# Patient Record
Sex: Male | Born: 1955 | Race: White | Hispanic: No | Smoking: Former smoker
Health system: Southern US, Community
[De-identification: ages and names within clinical notes are randomized; demographics above are authoritative.]

## PROBLEM LIST (undated history)

## (undated) DIAGNOSIS — Z87442 Personal history of urinary calculi: Secondary | ICD-10-CM

## (undated) DIAGNOSIS — K7460 Unspecified cirrhosis of liver: Secondary | ICD-10-CM

## (undated) DIAGNOSIS — K449 Diaphragmatic hernia without obstruction or gangrene: Secondary | ICD-10-CM

## (undated) DIAGNOSIS — K759 Inflammatory liver disease, unspecified: Secondary | ICD-10-CM

## (undated) DIAGNOSIS — I10 Essential (primary) hypertension: Secondary | ICD-10-CM

## (undated) DIAGNOSIS — B9681 Helicobacter pylori [H. pylori] as the cause of diseases classified elsewhere: Secondary | ICD-10-CM

## (undated) DIAGNOSIS — E782 Mixed hyperlipidemia: Secondary | ICD-10-CM

## (undated) DIAGNOSIS — F419 Anxiety disorder, unspecified: Secondary | ICD-10-CM

## (undated) DIAGNOSIS — T7840XA Allergy, unspecified, initial encounter: Secondary | ICD-10-CM

## (undated) DIAGNOSIS — D3610 Benign neoplasm of peripheral nerves and autonomic nervous system, unspecified: Secondary | ICD-10-CM

## (undated) DIAGNOSIS — I2510 Atherosclerotic heart disease of native coronary artery without angina pectoris: Secondary | ICD-10-CM

## (undated) DIAGNOSIS — K2970 Gastritis, unspecified, without bleeding: Secondary | ICD-10-CM

## (undated) DIAGNOSIS — F1910 Other psychoactive substance abuse, uncomplicated: Secondary | ICD-10-CM

## (undated) DIAGNOSIS — S060X9A Concussion with loss of consciousness of unspecified duration, initial encounter: Secondary | ICD-10-CM

## (undated) DIAGNOSIS — Z720 Tobacco use: Secondary | ICD-10-CM

## (undated) DIAGNOSIS — S060XAA Concussion with loss of consciousness status unknown, initial encounter: Secondary | ICD-10-CM

## (undated) DIAGNOSIS — I639 Cerebral infarction, unspecified: Secondary | ICD-10-CM

## (undated) DIAGNOSIS — C4490 Unspecified malignant neoplasm of skin, unspecified: Secondary | ICD-10-CM

## (undated) DIAGNOSIS — K219 Gastro-esophageal reflux disease without esophagitis: Secondary | ICD-10-CM

## (undated) HISTORY — DX: Gastro-esophageal reflux disease without esophagitis: K21.9

## (undated) HISTORY — DX: Unspecified cirrhosis of liver: K74.60

## (undated) HISTORY — DX: Mixed hyperlipidemia: E78.2

## (undated) HISTORY — DX: Essential (primary) hypertension: I10

## (undated) HISTORY — PX: HAND RECONSTRUCTION: SHX1730

## (undated) HISTORY — DX: Unspecified malignant neoplasm of skin, unspecified: C44.90

## (undated) HISTORY — DX: Allergy, unspecified, initial encounter: T78.40XA

## (undated) HISTORY — DX: Other psychoactive substance abuse, uncomplicated: F19.10

## (undated) HISTORY — DX: Anxiety disorder, unspecified: F41.9

## (undated) HISTORY — PX: LEG TENDON SURGERY: SHX1004

## (undated) HISTORY — DX: Diaphragmatic hernia without obstruction or gangrene: K44.9

## (undated) HISTORY — DX: Gastritis, unspecified, without bleeding: K29.70

## (undated) HISTORY — DX: Helicobacter pylori (H. pylori) as the cause of diseases classified elsewhere: B96.81

---
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
## 1999-07-15 HISTORY — PX: EXTRACORPOREAL SHOCK WAVE LITHOTRIPSY: SHX1557

## 2003-07-20 ENCOUNTER — Emergency Department (HOSPITAL_COMMUNITY): Admission: EM | Admit: 2003-07-20 | Discharge: 2003-07-20 | Payer: Self-pay | Admitting: Emergency Medicine

## 2006-02-25 ENCOUNTER — Emergency Department (HOSPITAL_COMMUNITY): Admission: EM | Admit: 2006-02-25 | Discharge: 2006-02-25 | Payer: Self-pay | Admitting: Emergency Medicine

## 2007-08-15 ENCOUNTER — Emergency Department (HOSPITAL_COMMUNITY): Admission: EM | Admit: 2007-08-15 | Discharge: 2007-08-15 | Payer: Self-pay | Admitting: Emergency Medicine

## 2007-08-25 ENCOUNTER — Emergency Department (HOSPITAL_COMMUNITY): Admission: EM | Admit: 2007-08-25 | Discharge: 2007-08-25 | Payer: Self-pay | Admitting: Emergency Medicine

## 2008-08-04 ENCOUNTER — Emergency Department (HOSPITAL_COMMUNITY): Admission: EM | Admit: 2008-08-04 | Discharge: 2008-08-04 | Payer: Self-pay | Admitting: Emergency Medicine

## 2009-09-26 ENCOUNTER — Emergency Department (HOSPITAL_COMMUNITY): Admission: EM | Admit: 2009-09-26 | Discharge: 2009-09-26 | Payer: Self-pay

## 2010-10-06 LAB — CBC
HCT: 43.2 % (ref 39.0–52.0)
Hemoglobin: 15.1 g/dL (ref 13.0–17.0)
MCHC: 35 g/dL (ref 30.0–36.0)
MCV: 93 fL (ref 78.0–100.0)
Platelets: 207 10*3/uL (ref 150–400)
RBC: 4.65 MIL/uL (ref 4.22–5.81)
RDW: 13.6 % (ref 11.5–15.5)
WBC: 8.3 10*3/uL (ref 4.0–10.5)

## 2010-10-06 LAB — COMPREHENSIVE METABOLIC PANEL
ALT: 26 U/L (ref 0–53)
AST: 24 U/L (ref 0–37)
Albumin: 3.7 g/dL (ref 3.5–5.2)
Alkaline Phosphatase: 84 U/L (ref 39–117)
BUN: 13 mg/dL (ref 6–23)
CO2: 27 mEq/L (ref 19–32)
Calcium: 9 mg/dL (ref 8.4–10.5)
Chloride: 106 mEq/L (ref 96–112)
Creatinine, Ser: 0.91 mg/dL (ref 0.4–1.5)
GFR calc Af Amer: >60 mL/min (ref >60)
GFR calc non Af Amer: >60 mL/min (ref >60)
Glucose, Bld: 83 mg/dL (ref 70–99)
Potassium: 3.8 mEq/L (ref 3.5–5.1)
Sodium: 139 mEq/L (ref 135–145)
Total Bilirubin: 0.5 mg/dL (ref 0.3–1.2)
Total Protein: 6.9 g/dL (ref 6.0–8.3)

## 2010-10-06 LAB — DIFFERENTIAL
Basophils Absolute: 0 10*3/uL (ref 0.0–0.1)
Basophils Relative: 0 % (ref 0–1)
Eosinophils Absolute: 0.1 10*3/uL (ref 0.0–0.7)
Eosinophils Relative: 1 % (ref 0–5)
Lymphocytes Relative: 29 % (ref 12–46)
Lymphs Abs: 2.4 10*3/uL (ref 0.7–4.0)
Monocytes Absolute: 0.6 10*3/uL (ref 0.1–1.0)
Monocytes Relative: 7 % (ref 3–12)
Neutro Abs: 5.2 10*3/uL (ref 1.7–7.7)
Neutrophils Relative %: 63 % (ref 43–77)

## 2010-10-06 LAB — URINALYSIS, ROUTINE W REFLEX MICROSCOPIC
Bilirubin Urine: NEGATIVE
Glucose, UA: NEGATIVE mg/dL
Hgb urine dipstick: NEGATIVE
Nitrite: NEGATIVE
Protein, ur: NEGATIVE mg/dL
Specific Gravity, Urine: >1.03 — ABNORMAL HIGH (ref 1.005–1.030)
Urobilinogen, UA: 1 mg/dL (ref 0.0–1.0)
pH: 6.5 (ref 5.0–8.0)

## 2010-10-06 LAB — POCT CARDIAC MARKERS
CKMB, poc: <1 ng/mL — ABNORMAL LOW (ref 1.0–8.0)
Myoglobin, poc: 59.7 ng/mL (ref 12–200)
Troponin i, poc: <0.05 ng/mL (ref 0.00–0.09)

## 2011-07-06 ENCOUNTER — Emergency Department (HOSPITAL_COMMUNITY)
Admission: EM | Admit: 2011-07-06 | Discharge: 2011-07-06 | Disposition: A | Discharge status: Home / Self Care | Payer: Self-pay | Attending: Emergency Medicine | Admitting: Emergency Medicine

## 2011-07-06 ENCOUNTER — Encounter: Payer: Self-pay | Admitting: *Deleted

## 2011-07-06 DIAGNOSIS — F17200 Nicotine dependence, unspecified, uncomplicated: Secondary | ICD-10-CM | POA: Insufficient documentation

## 2011-07-06 DIAGNOSIS — Y93H2 Activity, gardening and landscaping: Secondary | ICD-10-CM | POA: Insufficient documentation

## 2011-07-06 DIAGNOSIS — I10 Essential (primary) hypertension: Secondary | ICD-10-CM | POA: Insufficient documentation

## 2011-07-06 DIAGNOSIS — S0500XA Injury of conjunctiva and corneal abrasion without foreign body, unspecified eye, initial encounter: Secondary | ICD-10-CM

## 2011-07-06 DIAGNOSIS — E789 Disorder of lipoprotein metabolism, unspecified: Secondary | ICD-10-CM | POA: Insufficient documentation

## 2011-07-06 DIAGNOSIS — IMO0002 Reserved for concepts with insufficient information to code with codable children: Secondary | ICD-10-CM | POA: Insufficient documentation

## 2011-07-06 DIAGNOSIS — H5710 Ocular pain, unspecified eye: Secondary | ICD-10-CM | POA: Insufficient documentation

## 2011-07-06 DIAGNOSIS — S058X9A Other injuries of unspecified eye and orbit, initial encounter: Secondary | ICD-10-CM | POA: Insufficient documentation

## 2011-07-06 MED ORDER — MOXIFLOXACIN HCL 0.5 % OP SOLN: 1.0000 [drp] | Freq: Three times a day (TID) | OPHTHALMIC | Status: AC

## 2011-07-06 MED ORDER — TETRACAINE HCL 0.5 % OP SOLN
2.0000 [drp] | Freq: Once | OPHTHALMIC | Status: AC
  Administered 2011-07-06: 2 [drp] via OPHTHALMIC
  Filled 2011-07-06: qty 2

## 2011-07-06 MED ORDER — FLUORESCEIN SODIUM 1 MG OP STRP
ORAL_STRIP | OPHTHALMIC | Status: AC
  Administered 2011-07-06: 11:00:00 via OPHTHALMIC
  Filled 2011-07-06: qty 1

## 2011-07-06 NOTE — ED Provider Notes (Signed)
History   This chart was scribed for Duane Baker, MD by Charolett Bumpers . The patient was seen in room APA19/APA19 and the patient's care was started at 9:05am.  CSN: [id number]  Arrival date & time 07/06/11  1914   First MD Initiated Contact with Patient 07/06/11 [phone]      Chief Complaint  Patient presents with  . Eye Pain    (Consider location/radiation/quality/duration/timing/severity/associated sxs/prior treatment) HPI Duane Adams is a 55 y.o. male who presents to the Emergency Department complaining of constant, moderate pain in his right eye with associated foreign body sensation, drainage and redness with an onset of yesterday. Patient states he was cutting the grass yesterday when a rock hit him in the right eye. Patient was wearing glasses at the time of incident. Reports pain is aggravated and relieved by nothing. Some photophobia noted   Past Medical History  Diagnosis Date  . Hypertension   . Anginal pain   . High cholesterol     History reviewed. No pertinent past surgical history.  No family history on file.  History  Substance Use Topics  . Smoking status: Current Everyday Smoker    Types: Cigarettes  . Smokeless tobacco: Not on file  . Alcohol Use: Yes     occasional      Review of Systems A complete 10 system review of systems was obtained and is otherwise negative except as noted in the HPI and PMH.   Allergies  Review of patient's allergies indicates no known allergies.  Home Medications  No current outpatient prescriptions on file.  BP 154/85  Pulse 110  Temp(Src) 98.3 F (36.8 C) (Oral)  Resp 16  Ht 5\' 9"  (1.753 m)  Wt 165 lb (74.844 kg)  BMI 24.37 kg/m2  SpO2 98%  Physical Exam  Nursing note and vitals reviewed. Constitutional: He is oriented to person, place, and time. He appears well-developed and well-nourished. No distress.  HENT:  Head: Normocephalic and atraumatic.  Eyes: EOM are normal. Pupils are equal,  round, and reactive to light. Right eye exhibits no discharge. Left eye exhibits no discharge.       No drainage. Right sclera injected, cornea clear. No hyphema.   Neck: Neck supple. No tracheal deviation present.  Cardiovascular: Normal rate, regular rhythm and normal heart sounds.   Pulmonary/Chest: Effort normal. No respiratory distress.  Abdominal: Soft. He exhibits no distension.  Musculoskeletal: Normal range of motion. He exhibits no edema.  Neurological: He is alert and oriented to person, place, and time. No sensory deficit.  Skin: Skin is warm and dry.  Psychiatric: He has a normal mood and affect. His behavior is normal.    ED Course  Procedures (including critical care time) DIAGNOSTIC STUDIES: Oxygen Saturation is 98% on room air, normal by my interpretation.    COORDINATION OF CARE:     Labs Reviewed - No data to display No results found.   No diagnosis found.    MDM  Patient's eye was stained with fluorescein after being given tetracaine. Patient has positive diet uptake at the 4:00 medial region of his eye on the right side. Patient will be irrigated with saline and will be placed on antibiotic drops for his corneal abrasion   I personally performed the services described in this documentation, which was scribed in my presence. The recorded information has been reviewed and considered.       Duane Baker, MD 07/06/11 1006

## 2011-07-06 NOTE — ED Notes (Signed)
Pt states was cutting grass yesterday and rock hit him in right eye.  C/o increased pain, drainage, and redness to right eye.  States feels like something is in eye.

## 2011-10-30 ENCOUNTER — Emergency Department (HOSPITAL_COMMUNITY)
Admission: EM | Admit: 2011-10-30 | Discharge: 2011-10-30 | Disposition: A | Discharge status: Home / Self Care | Payer: Self-pay | Attending: Emergency Medicine | Admitting: Emergency Medicine

## 2011-10-30 ENCOUNTER — Encounter (HOSPITAL_COMMUNITY): Payer: Self-pay | Admitting: Emergency Medicine

## 2011-10-30 DIAGNOSIS — I10 Essential (primary) hypertension: Secondary | ICD-10-CM | POA: Insufficient documentation

## 2011-10-30 DIAGNOSIS — Z79899 Other long term (current) drug therapy: Secondary | ICD-10-CM | POA: Insufficient documentation

## 2011-10-30 DIAGNOSIS — R209 Unspecified disturbances of skin sensation: Secondary | ICD-10-CM | POA: Insufficient documentation

## 2011-10-30 DIAGNOSIS — F17200 Nicotine dependence, unspecified, uncomplicated: Secondary | ICD-10-CM | POA: Insufficient documentation

## 2011-10-30 DIAGNOSIS — R42 Dizziness and giddiness: Secondary | ICD-10-CM | POA: Insufficient documentation

## 2011-10-30 DIAGNOSIS — E789 Disorder of lipoprotein metabolism, unspecified: Secondary | ICD-10-CM | POA: Insufficient documentation

## 2011-10-30 LAB — DIFFERENTIAL
Basophils Absolute: 0 10*3/uL (ref 0.0–0.1)
Basophils Relative: 1 % (ref 0–1)
Eosinophils Absolute: 0.1 10*3/uL (ref 0.0–0.7)
Eosinophils Relative: 1 % (ref 0–5)
Lymphocytes Relative: 30 % (ref 12–46)
Lymphs Abs: 2.2 10*3/uL (ref 0.7–4.0)
Monocytes Absolute: 0.5 10*3/uL (ref 0.1–1.0)
Monocytes Relative: 7 % (ref 3–12)
Neutro Abs: 4.6 10*3/uL (ref 1.7–7.7)
Neutrophils Relative %: 63 % (ref 43–77)

## 2011-10-30 LAB — CBC
HCT: 45.6 % (ref 39.0–52.0)
Hemoglobin: 15.4 g/dL (ref 13.0–17.0)
MCH: 31.5 pg (ref 26.0–34.0)
MCHC: 33.8 g/dL (ref 30.0–36.0)
MCV: 93.3 fL (ref 78.0–100.0)
Platelets: 170 10*3/uL (ref 150–400)
RBC: 4.89 MIL/uL (ref 4.22–5.81)
RDW: 14.2 % (ref 11.5–15.5)
WBC: 7.4 10*3/uL (ref 4.0–10.5)

## 2011-10-30 LAB — BASIC METABOLIC PANEL
BUN: 7 mg/dL (ref 6–23)
CO2: 27 mEq/L (ref 19–32)
Calcium: 9.4 mg/dL (ref 8.4–10.5)
Chloride: 100 mEq/L (ref 96–112)
Creatinine, Ser: 0.71 mg/dL (ref 0.50–1.35)
GFR calc Af Amer: >90 mL/min (ref >90)
GFR calc non Af Amer: >90 mL/min (ref >90)
Glucose, Bld: 104 mg/dL — ABNORMAL HIGH (ref 70–99)
Potassium: 4.2 mEq/L (ref 3.5–5.1)
Sodium: 135 mEq/L (ref 135–145)

## 2011-10-30 LAB — URINALYSIS, ROUTINE W REFLEX MICROSCOPIC
Bilirubin Urine: NEGATIVE
Glucose, UA: NEGATIVE mg/dL
Hgb urine dipstick: NEGATIVE
Ketones, ur: NEGATIVE mg/dL
Leukocytes, UA: NEGATIVE
Nitrite: NEGATIVE
Protein, ur: NEGATIVE mg/dL
Specific Gravity, Urine: 1.01 (ref 1.005–1.030)
Urobilinogen, UA: 0.2 mg/dL (ref 0.0–1.0)
pH: 7.5 (ref 5.0–8.0)

## 2011-10-30 MED ORDER — HYDROCHLOROTHIAZIDE 25 MG PO TABS: 25.0000 mg | ORAL_TABLET | Freq: Every day | ORAL | Status: DC

## 2011-10-30 MED ORDER — ATENOLOL 50 MG PO TABS: 50.0000 mg | ORAL_TABLET | Freq: Every day | ORAL | Status: DC

## 2011-10-30 NOTE — ED Provider Notes (Signed)
History   This chart was scribed for Flint Melter, MD by Cherlynn Perches. The patient was seen in room APA16A/APA16A. Patient's care was started at 1043.    CSN: [id number]  Arrival date & time 10/30/11  1043   First MD Initiated Contact with Patient 10/30/11 1101      Chief Complaint  Patient presents with  . Dizziness  . Numbness    (Consider location/radiation/quality/duration/timing/severity/associated sxs/prior treatment) HPI  VU LIEBMAN is a 56 y.o. male who presents to the Emergency Department complaining of 3 hours of intermittent numbness of the left arm and left foot with associated dizziness and light-headedness. 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 Pt states that when episodes began they lasted 30 seconds each with 10 minutes between episodes, but the frequency and duration of episodes increased. Last episode was immediately prior to reporting to ED. Pt also reports that he has not eaten anything today, but states that he normally does not eat breakfast until 10:30 AM. Pt reports that he has not been compliant in taking high blood pressure and cholesterol medications as prescribed. Pt denies HA, neck pain, back pain, chest pain, and fever. Pt is a current everyday smoker and uses alcohol occasionally.    Past Medical History  Diagnosis Date  . Hypertension   . Anginal pain   . High cholesterol     History reviewed. No pertinent past surgical history.  History reviewed. No pertinent family history.  History  Substance Use Topics  . Smoking status: Current Everyday Smoker -- 1.0 packs/day    Types: Cigarettes  . Smokeless tobacco: Not on file  . Alcohol Use: Yes     occasional      Review of Systems  Allergies  Review of patient's allergies indicates no known allergies.  Home Medications   Current Outpatient Rx  Name Route Sig Dispense Refill  . ATENOLOL 50 MG PO TABS Oral Take 1 tablet (50 mg total) by mouth  daily. 90 tablet 0  . HYDROCHLOROTHIAZIDE 25 MG PO TABS Oral Take 1 tablet (25 mg total) by mouth daily. 90 tablet 0    BP 177/96  Pulse 82  Temp(Src) 98.3 F (36.8 C) (Oral)  Resp 20  Ht 5\' 9"  (1.753 m)  Wt 165 lb (74.844 kg)  BMI 24.37 kg/m2  SpO2 97%  Physical Exam  Nursing note and vitals reviewed. Constitutional: He is oriented to person, place, and time. He appears well-developed and well-nourished.  HENT:  Head: Normocephalic and atraumatic.  Right Ear: External ear normal.  Left Ear: External ear normal.  Eyes: Conjunctivae and EOM are normal. Pupils are equal, round, and reactive to light.  Neck: Normal range of motion and phonation normal. Neck supple.  Cardiovascular: Normal rate, regular rhythm, normal heart sounds and intact distal pulses.   Pulmonary/Chest: Effort normal and breath sounds normal. He exhibits no bony tenderness.  Abdominal: Soft. Normal appearance. There is no tenderness.  Musculoskeletal: Normal range of motion.  Neurological: He is alert and oriented to person, place, and time. He has normal strength. No cranial nerve deficit or sensory deficit. He exhibits normal muscle tone. Coordination normal.  Skin: Skin is warm, dry and intact.  Psychiatric: He has a normal mood and affect. His behavior is normal. Judgment and thought content normal.    ED Course  Procedures (including critical care time) DIAGNOSTIC STUDIES: Oxygen Saturation is 97% on room air, normal by my interpretation.    COORDINATION  OF CARE: 11:30AM-Patient informed of current plan for treatment and evaluation and agrees with plan at this time.      Labs Reviewed  BASIC METABOLIC PANEL - Abnormal; Notable for the following:    Glucose, Bld 104 (*)    All other components within normal limits  CBC  DIFFERENTIAL  URINALYSIS, ROUTINE W REFLEX MICROSCOPIC  LAB REPORT - SCANNED   No results found.   1. Hypertension       MDM  Nonspecific paresthesias. Doubt  Hypertensive crises, intracranial mass lesion, metabolic instability.      I personally performed the services described in this documentation, which was scribed in my presence. The recorded information has been reviewed and considered.   Plan: Home Medications- Atenolol, HCTZ; Home Treatments- healthy diet; Recommended follow up- PCP of choice 1-2 weeks   Flint Melter, MD 10/31/11 1719

## 2011-10-30 NOTE — ED Notes (Signed)
C/o dizziness and left arm numbness since 0830 this morning off and on, grips are equal, no arm drift and pt denies any weakness

## 2011-10-30 NOTE — Discharge Instructions (Signed)
Use a resource guide to find a Doctor to see as soon as possible for ongoing care. Return here if needed for problems.  RESOURCE GUIDE  Dental Problems  Patients with Medicaid: [facility] [phone] [address]                                           [phone] [address] Phone:  [phone]                                                  Phone:  [phone]  If unable to pay or uninsured, contact:  [facility] or [facility] to become qualified for the adult dental clinic.  Chronic Pain Problems Contact [facility]  [phone] Patients need to be referred by their primary care doctor.  Insufficient Money for Medicine Contact [employer]:  call "211" or [facility] 8328745916.  No Primary Care Doctor Call [facility]  [phone] Other agencies that provide inexpensive medical care    [facility]  [phone]    [facility]  [phone]    [facility]  [phone]    [facility]  [phone]    [facility]  [phone]    [facility]  [phone]  Psychological Services [facility]  [phone] [facility] Services  [phone] [facility] Mental [facility]   [phone] (emergency services [phone])  Substance Abuse Resources [facility]  [phone] [facility] ([phone] [place] [phone] Floydene Flock [phone] Residential & Outpatient Substance Abuse Program  7736466522  Abuse/Neglect [facility] Child Abuse Hotline (514)168-7373 [facility] Child Abuse Hotline (979)566-1353 (After Hours)  Emergency Shelter [facility] [phone]  [facility] [phone] [facility] 7022082781  MRSA Hotline #:   609-418-8565    [facility] Resources  [facility]     [employer]                           [facility] Dept. [address] [place]                       [address]      [address]  [place]                                                Cristobal Goldmann Phone:  [phone]                                   Phone:  [phone]  Phone:  [phone]  [facility] Phone:  [phone]  [facility] Child Abuse Hotline (773)057-2741 ([phone] (After Hours)           Hypertension Information As your heart beats, it forces blood through your arteries. This force is your blood pressure. If the pressure is too high, it is called hypertension (HTN) or high blood pressure. HTN is dangerous because you may have it and not know it. High blood pressure may mean that your heart has to work harder to pump blood. Your arteries may be narrow or stiff. The extra work puts you at risk for heart disease, stroke, and other problems.  Blood pressure consists of two numbers, a higher number over a lower, 110/72, for example. It is stated as "110 over 72." The ideal is below 120 for the top number (systolic) and under 80 for the bottom (diastolic).  You should pay close attention to your blood pressure if you have certain conditions such as:  Heart failure.   Prior heart attack.   Diabetes   Chronic kidney disease.   Prior stroke.   Multiple risk factors for heart disease.  To see if you have HTN, your blood pressure should be measured while you are seated with your arm held at the level of the heart. It should be measured at least twice. A one-time elevated blood pressure reading (especially in the Emergency Department) does not mean that you need treatment. There may be conditions in which the blood pressure is different between your right and left arms. It is important to see your caregiver soon for a recheck. Most people have essential hypertension which means that there is  not a specific cause. This type of high blood pressure may be lowered by changing lifestyle factors such as:  Stress.   Smoking.   Lack of exercise.   Excessive weight.   Drug/tobacco/alcohol use.   Eating less salt.  Most people do not have symptoms from high blood pressure until it has caused damage to the body. Effective treatment can often prevent, delay or reduce that damage. TREATMENT  Treatment for high blood pressure, when a cause has been identified, is directed at the cause. There are a large number of medications to treat HTN. These fall into several categories, and your caregiver will help you select the medicines that are best for you. Medications may have side effects. You should review side effects with your caregiver. If your blood pressure stays high after you have made lifestyle changes or started on medicines,   Your medication(s) may need to be changed.   Other problems may need to be addressed.   Be certain you understand your prescriptions, and know how and when to take your medicine.   Be sure to follow up with your caregiver within the time frame advised (usually within two weeks) to have your blood pressure rechecked and to review your medications.   If you are taking more than one medicine to lower your blood pressure, make sure you know how and at what times they should be taken. Taking two medicines at the same time can result in blood pressure that is too low.  Document Released: 09/02/2005 Document Revised: 03/12/2011 Document Reviewed: 09/09/2007 [facility] Patient Information 2012 [place], [place].

## 2011-10-30 NOTE — ED Notes (Signed)
pts states at work had dizzy and left arm numbness episode. States normally goes away but today it stayed. Continues dizziness and left arm numbness and tingling.

## 2011-10-30 NOTE — ED Notes (Signed)
MD at bedside. 

## 2012-05-14 ENCOUNTER — Emergency Department (HOSPITAL_COMMUNITY): Payer: Self-pay

## 2012-05-14 ENCOUNTER — Encounter (HOSPITAL_COMMUNITY): Payer: Self-pay | Admitting: Emergency Medicine

## 2012-05-14 ENCOUNTER — Emergency Department (HOSPITAL_COMMUNITY)
Admission: EM | Admit: 2012-05-14 | Discharge: 2012-05-14 | Disposition: A | Discharge status: Home / Self Care | Payer: Self-pay | Attending: Emergency Medicine | Admitting: Emergency Medicine

## 2012-05-14 DIAGNOSIS — Z79899 Other long term (current) drug therapy: Secondary | ICD-10-CM | POA: Insufficient documentation

## 2012-05-14 DIAGNOSIS — R072 Precordial pain: Secondary | ICD-10-CM | POA: Insufficient documentation

## 2012-05-14 DIAGNOSIS — S8010XA Contusion of unspecified lower leg, initial encounter: Secondary | ICD-10-CM | POA: Insufficient documentation

## 2012-05-14 DIAGNOSIS — I10 Essential (primary) hypertension: Secondary | ICD-10-CM | POA: Insufficient documentation

## 2012-05-14 DIAGNOSIS — E7800 Pure hypercholesterolemia, unspecified: Secondary | ICD-10-CM | POA: Insufficient documentation

## 2012-05-14 DIAGNOSIS — Y929 Unspecified place or not applicable: Secondary | ICD-10-CM | POA: Insufficient documentation

## 2012-05-14 DIAGNOSIS — Y939 Activity, unspecified: Secondary | ICD-10-CM | POA: Insufficient documentation

## 2012-05-14 DIAGNOSIS — F17200 Nicotine dependence, unspecified, uncomplicated: Secondary | ICD-10-CM | POA: Insufficient documentation

## 2012-05-14 DIAGNOSIS — W010XXA Fall on same level from slipping, tripping and stumbling without subsequent striking against object, initial encounter: Secondary | ICD-10-CM | POA: Insufficient documentation

## 2012-05-14 DIAGNOSIS — S8012XA Contusion of left lower leg, initial encounter: Secondary | ICD-10-CM

## 2012-05-14 MED ORDER — NAPROXEN 250 MG PO TABS: 250.0000 mg | ORAL_TABLET | Freq: Two times a day (BID) | ORAL | Status: DC

## 2012-05-14 MED ORDER — HYDROCODONE-ACETAMINOPHEN 5-325 MG PO TABS: ORAL_TABLET | ORAL | Status: DC
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
## 2012-08-18 ENCOUNTER — Emergency Department (HOSPITAL_COMMUNITY)
Admission: EM | Admit: 2012-08-18 | Discharge: 2012-08-18 | Disposition: A | Discharge status: Home / Self Care | Payer: Self-pay | Attending: Emergency Medicine | Admitting: Emergency Medicine

## 2012-08-18 ENCOUNTER — Encounter (HOSPITAL_COMMUNITY): Payer: Self-pay | Admitting: Emergency Medicine

## 2012-08-18 DIAGNOSIS — R220 Localized swelling, mass and lump, head: Secondary | ICD-10-CM | POA: Insufficient documentation

## 2012-08-18 DIAGNOSIS — Z7982 Long term (current) use of aspirin: Secondary | ICD-10-CM | POA: Insufficient documentation

## 2012-08-18 DIAGNOSIS — F29 Unspecified psychosis not due to a substance or known physiological condition: Secondary | ICD-10-CM | POA: Insufficient documentation

## 2012-08-18 DIAGNOSIS — R42 Dizziness and giddiness: Secondary | ICD-10-CM | POA: Insufficient documentation

## 2012-08-18 DIAGNOSIS — I10 Essential (primary) hypertension: Secondary | ICD-10-CM | POA: Insufficient documentation

## 2012-08-18 DIAGNOSIS — Z8709 Personal history of other diseases of the respiratory system: Secondary | ICD-10-CM | POA: Insufficient documentation

## 2012-08-18 DIAGNOSIS — R0789 Other chest pain: Secondary | ICD-10-CM | POA: Insufficient documentation

## 2012-08-18 DIAGNOSIS — R0602 Shortness of breath: Secondary | ICD-10-CM | POA: Insufficient documentation

## 2012-08-18 DIAGNOSIS — R404 Transient alteration of awareness: Secondary | ICD-10-CM | POA: Insufficient documentation

## 2012-08-18 DIAGNOSIS — F17200 Nicotine dependence, unspecified, uncomplicated: Secondary | ICD-10-CM | POA: Insufficient documentation

## 2012-08-18 DIAGNOSIS — T502X5A Adverse effect of carbonic-anhydrase inhibitors, benzothiadiazides and other diuretics, initial encounter: Secondary | ICD-10-CM | POA: Insufficient documentation

## 2012-08-18 DIAGNOSIS — Z79899 Other long term (current) drug therapy: Secondary | ICD-10-CM | POA: Insufficient documentation

## 2012-08-18 DIAGNOSIS — T465X5A Adverse effect of other antihypertensive drugs, initial encounter: Secondary | ICD-10-CM | POA: Insufficient documentation

## 2012-08-18 DIAGNOSIS — E7800 Pure hypercholesterolemia, unspecified: Secondary | ICD-10-CM | POA: Insufficient documentation

## 2012-08-18 DIAGNOSIS — T887XXA Unspecified adverse effect of drug or medicament, initial encounter: Secondary | ICD-10-CM

## 2012-08-18 LAB — TROPONIN I: Troponin I: <0.3 ng/mL (ref <0.30)

## 2012-08-18 MED ORDER — NITROGLYCERIN 2 % TD OINT
1.0000 [in_us] | TOPICAL_OINTMENT | Freq: Once | TRANSDERMAL | Status: AC
  Administered 2012-08-18: 1 [in_us] via TOPICAL
  Filled 2012-08-18: qty 1

## 2012-08-18 MED ORDER — OMEPRAZOLE 20 MG PO CPDR: DELAYED_RELEASE_CAPSULE | ORAL | Status: DC

## 2012-08-18 MED ORDER — GI COCKTAIL ~~LOC~~
30.0000 mL | Freq: Once | ORAL | Status: AC
  Administered 2012-08-18: 30 mL via ORAL
  Filled 2012-08-18: qty 30

## 2012-08-18 MED ORDER — FAMOTIDINE 20 MG PO TABS
20.0000 mg | ORAL_TABLET | Freq: Once | ORAL | Status: AC
  Administered 2012-08-18: 20 mg via ORAL
  Filled 2012-08-18: qty 1

## 2012-08-18 MED ORDER — AMLODIPINE BESYLATE 10 MG PO TABS: 10.0000 mg | ORAL_TABLET | Freq: Every day | ORAL | Status: DC
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
## 2012-08-18 NOTE — ED Provider Notes (Signed)
History   This chart was scribed for Ward Givens, MD by Charolett Bumpers, ED Scribe. The patient was seen in room APA02/APA02. Patient's care was started at 1029.   CSN: [id number]  Arrival date & time 08/18/12  1015   First MD Initiated Contact with Patient 08/18/12 1029      Chief Complaint  Patient presents with  . Allergic Reaction    lisinopril    The history is provided by the patient. No language interpreter was used.  Duane Adams is a 57 y.o. male who presents to the Emergency Department complaining of constant, central, burning in chest that started 2 days ago with SOB and decreased appetite. He rates the chest pain 3/10 currently. He states that he feels like he may be having an allergic reaction to Lisinopril. He states that he started Lisinopril and Metoprolol 3 days ago for his tachycardia of 106. He reports drowsiness, hot flashes, facial swelling around eyes and cheeks, dizziness and mild confusion that lasted all day 2-3 days ago after taking his new medications. He states he stays in bed all day b/o lack of energy and feeling sleepy. He took them two days in a row with same results.  He states his BP is normally 140/90. He states that he had an [facility] and was given samples of accupril and topral which he has finished then he started the new medications. He denies any lip or throat swelling, change in voice, difficulty swallowing. He denies any nausea, vomiting, diarrhea. He does report loss of appetite. His wife states he had swelling around his eyes today.  He is followed by [facility].   Past Medical History  Diagnosis Date  . Hypertension   . Anginal pain   . High cholesterol     History reviewed. No pertinent past surgical history.  History reviewed. No pertinent family history.  History  Substance Use Topics  . Smoking status: Current Every Day Smoker -- 1.0 packs/day    Types: Cigarettes  .  Smokeless tobacco: Not on file  . Alcohol Use: Yes     Comment: beer q 3 weeks.   Admits to smoking a pack daily. Drinks one beer a week Self/un-employed working with installing floors Lives at home Lives with spouse  Review of Systems  Constitutional: Positive for appetite change. Negative for fever.  HENT: Positive for facial swelling. Negative for trouble swallowing.   Respiratory: Positive for shortness of breath. Negative for cough.   Cardiovascular: Positive for chest pain. Negative for palpitations.  Gastrointestinal: Negative for nausea, vomiting and diarrhea.  Neurological: Positive for dizziness. Negative for headaches.  Psychiatric/Behavioral: Positive for confusion.  All other systems reviewed and are negative.    Allergies  Lisinopril  Home Medications   Current Outpatient Rx  Name  Route  Sig  Dispense  Refill  . ASPIRIN EC 81 MG PO TBEC   Oral   Take 81 mg by mouth daily.         . ATENOLOL 50 MG PO TABS   Oral   Take 1 tablet (50 mg total) by mouth daily.   90 tablet   0   . OMEGA-3 FATTY ACIDS 1000 MG PO CAPS   Oral   Take 1 g by mouth daily.         Marland Kitchen HYDROCHLOROTHIAZIDE 25 MG PO TABS   Oral   Take 1 tablet (25 mg total) by mouth daily.  90 tablet   0   . LISINOPRIL 20 MG PO TABS   Oral   Take 20 mg by mouth daily.         Marland Kitchen METOPROLOL TARTRATE 25 MG PO TABS   Oral   Take 25 mg by mouth 2 (two) times daily.         Carma Leaven M PLUS PO TABS   Oral   Take 1 tablet by mouth daily.         Marland Kitchen VITAMIN B-1 100 MG PO TABS   Oral   Take 100 mg by mouth daily.           Triage Vitals: BP 161/100  Pulse 107  Temp 98 F (36.7 C) (Oral)  Resp 20  Ht 5\' 9"  (1.753 m)  Wt 175 lb (79.379 kg)  BMI 25.84 kg/m2  SpO2 98%  Vital signs normal except tachycardia and hypertension   Physical Exam  Nursing note and vitals reviewed. Constitutional: He is oriented to person, place, and time. He appears well-developed and  well-nourished. No distress.  HENT:  Head: Normocephalic and atraumatic.  Right Ear: External ear normal.  Left Ear: External ear normal.  Nose: Nose normal.  Mouth/Throat: Oropharynx is clear and moist. No oropharyngeal exudate.  Eyes: Conjunctivae normal and EOM are normal. Pupils are equal, round, and reactive to light.  Neck: Normal range of motion. Neck supple. No tracheal deviation present.  Cardiovascular: Normal rate, regular rhythm and normal heart sounds.  Exam reveals no gallop and no friction rub.   No murmur heard. Pulmonary/Chest: Effort normal and breath sounds normal. No respiratory distress. He has no wheezes. He has no rhonchi. He has no rales.  Abdominal: Soft. Bowel sounds are normal. He exhibits no distension. There is no tenderness. There is no rebound.  Musculoskeletal: Normal range of motion. He exhibits no edema and no tenderness.  Neurological: He is alert and oriented to person, place, and time.  Skin: Skin is warm and dry.  Psychiatric: He has a normal mood and affect. His behavior is normal.    ED Course  Procedures (including critical care time)   Medications  nitroGLYCERIN (NITROGLYN) 2 % ointment 1 inch (1 inch Topical Given 08/18/12 1133)  gi cocktail (Maalox,Lidocaine,Donnatal) (30 mL Oral Given 08/18/12 1132)  famotidine (PEPCID) tablet 20 mg (20 mg Oral Given 08/18/12 1132)     DIAGNOSTIC STUDIES: Oxygen Saturation is 98% on room air, normal by my interpretation.    COORDINATION OF CARE:  11:20-Discussed planned course of treatment with the patient including checking troponin, who is agreeable at this time.   Recheck at discharge and his pain is gone, feels ready to go home. BP was 134/79 with pulse rate 73  Results for orders placed during the hospital encounter of 08/18/12  TROPONIN I      Component Value Range   Troponin I <0.30  <0.30 ng/mL     Date: 08/18/2012  Rate: 88  Rhythm: normal sinus rhythm  QRS Axis: normal  Intervals:  normal  ST/T Wave abnormalities: normal  Conduction Disutrbances:none  Narrative Interpretation:   Old EKG Reviewed: unchanged from 09/26/2009   1. Chest pain, atypical   2. Medication side effect       New Prescriptions   AMLODIPINE (NORVASC) 10 MG TABLET    Take 1 tablet (10 mg total) by mouth daily.   OMEPRAZOLE (PRILOSEC) 20 MG CAPSULE    Take 1 po BID x 2 weeks then once a day  Plan discharge  Devoria Albe, MD, FACEP    MDM  patient's lethargy and lack energy is most likely from the beta blockers he was placed on to control his heart rate. The swelling of the face which I could not appreciate may be from being on the ACE inhibitors which he was advised to quit. He was encouraged to stop drinking caffeine which can make him have a resting tachycardia. He also was started on Norvasc in addition to his hydrochlorothiazide for his hypertension.     I personally performed the services described in this documentation, which was scribed in my presence. The recorded information has been reviewed and considered.  Devoria Albe, MD, FACEP      Ward Givens, MD 08/18/12 [phone]

## 2012-08-18 NOTE — ED Notes (Signed)
Pt reports has been on metoprolol and started lisinopril Sunday.  Reports since then has had dizziness, chest pain, swelling in face around eyes, "funny feeling" around mouth, and some SOB.  Denies any difficulty swallowing.

## 2012-08-18 NOTE — ED Notes (Signed)
Started on lisinopril Sunday and started having chest pain, SOB, cough, dizziness. Pt took pills Sunday and Monday and stopped due to feeling. Pt states continues to have same symptoms.

## 2013-02-16 ENCOUNTER — Other Ambulatory Visit (HOSPITAL_COMMUNITY): Payer: Self-pay | Admitting: Physician Assistant

## 2013-02-28 ENCOUNTER — Other Ambulatory Visit (HOSPITAL_COMMUNITY): Payer: Self-pay | Admitting: Physician Assistant

## 2013-02-28 DIAGNOSIS — R9389 Abnormal findings on diagnostic imaging of other specified body structures: Secondary | ICD-10-CM

## 2013-02-28 DIAGNOSIS — F17200 Nicotine dependence, unspecified, uncomplicated: Secondary | ICD-10-CM

## 2013-03-02 ENCOUNTER — Ambulatory Visit (HOSPITAL_COMMUNITY)
Admission: RE | Admit: 2013-03-02 | Discharge: 2013-03-02 | Disposition: A | Discharge status: Home / Self Care | Payer: Self-pay | Source: Ambulatory Visit | Attending: Physician Assistant | Admitting: Physician Assistant

## 2013-03-02 DIAGNOSIS — J8410 Pulmonary fibrosis, unspecified: Secondary | ICD-10-CM | POA: Insufficient documentation

## 2013-03-02 DIAGNOSIS — R059 Cough, unspecified: Secondary | ICD-10-CM | POA: Insufficient documentation

## 2013-03-02 DIAGNOSIS — J984 Other disorders of lung: Secondary | ICD-10-CM | POA: Insufficient documentation

## 2013-03-02 DIAGNOSIS — R05 Cough: Secondary | ICD-10-CM | POA: Insufficient documentation

## 2013-03-02 DIAGNOSIS — I209 Angina pectoris, unspecified: Secondary | ICD-10-CM | POA: Insufficient documentation

## 2013-03-02 DIAGNOSIS — I700 Atherosclerosis of aorta: Secondary | ICD-10-CM | POA: Insufficient documentation

## 2013-03-02 DIAGNOSIS — I2510 Atherosclerotic heart disease of native coronary artery without angina pectoris: Secondary | ICD-10-CM | POA: Insufficient documentation

## 2013-03-02 DIAGNOSIS — R9389 Abnormal findings on diagnostic imaging of other specified body structures: Secondary | ICD-10-CM

## 2013-03-02 DIAGNOSIS — R911 Solitary pulmonary nodule: Secondary | ICD-10-CM | POA: Insufficient documentation

## 2013-03-02 DIAGNOSIS — R0602 Shortness of breath: Secondary | ICD-10-CM | POA: Insufficient documentation

## 2013-03-02 DIAGNOSIS — R599 Enlarged lymph nodes, unspecified: Secondary | ICD-10-CM | POA: Insufficient documentation

## 2013-03-02 DIAGNOSIS — F17200 Nicotine dependence, unspecified, uncomplicated: Secondary | ICD-10-CM | POA: Insufficient documentation

## 2013-03-16 ENCOUNTER — Encounter: Payer: Self-pay | Admitting: *Deleted

## 2013-03-16 DIAGNOSIS — C44701 Unspecified malignant neoplasm of skin of unspecified lower limb, including hip: Secondary | ICD-10-CM | POA: Insufficient documentation

## 2013-03-16 DIAGNOSIS — I10 Essential (primary) hypertension: Secondary | ICD-10-CM

## 2013-03-16 DIAGNOSIS — R079 Chest pain, unspecified: Secondary | ICD-10-CM

## 2013-03-16 DIAGNOSIS — E785 Hyperlipidemia, unspecified: Secondary | ICD-10-CM

## 2013-03-16 DIAGNOSIS — F17200 Nicotine dependence, unspecified, uncomplicated: Secondary | ICD-10-CM

## 2013-03-17 ENCOUNTER — Encounter: Payer: Self-pay | Admitting: Cardiology

## 2013-03-17 NOTE — Progress Notes (Signed)
Rescheduled This encounter was created in error - please disregard. 

## 2013-03-31 ENCOUNTER — Encounter: Payer: Self-pay | Admitting: *Deleted

## 2013-03-31 ENCOUNTER — Ambulatory Visit (INDEPENDENT_AMBULATORY_CARE_PROVIDER_SITE_OTHER): Payer: No Typology Code available for payment source | Admitting: Cardiology

## 2013-03-31 ENCOUNTER — Encounter: Payer: Self-pay | Admitting: Cardiology

## 2013-03-31 VITALS — BP 181/104 | HR 80 | Ht 69.0 in | Wt 174.8 lb

## 2013-03-31 DIAGNOSIS — I10 Essential (primary) hypertension: Secondary | ICD-10-CM

## 2013-03-31 DIAGNOSIS — R079 Chest pain, unspecified: Secondary | ICD-10-CM

## 2013-03-31 DIAGNOSIS — E785 Hyperlipidemia, unspecified: Secondary | ICD-10-CM

## 2013-03-31 LAB — COMPLETE METABOLIC PANEL WITH GFR
ALT: 19 U/L (ref 0–53)
Alkaline Phosphatase: 86 U/L (ref 39–117)
GFR, Est Non African American: >89 mL/min
Glucose, Bld: 94 mg/dL (ref 70–99)
Sodium: 139 mEq/L (ref 135–145)
Total Bilirubin: 0.4 mg/dL (ref 0.3–1.2)
Total Protein: 7.5 g/dL (ref 6.0–8.3)

## 2013-03-31 LAB — CBC
HCT: 46.5 % (ref 39.0–52.0)
Hemoglobin: 16.5 g/dL (ref 13.0–17.0)
MCHC: 35.5 g/dL (ref 30.0–36.0)

## 2013-03-31 LAB — LIPID PANEL
HDL: 47 mg/dL (ref >39)
LDL Cholesterol: 144 mg/dL — ABNORMAL HIGH (ref 0–99)
Triglycerides: 194 mg/dL — ABNORMAL HIGH (ref <150)

## 2013-03-31 LAB — PROTIME-INR: INR: 0.91 (ref <1.50)

## 2013-03-31 MED ORDER — AMLODIPINE BESYLATE 5 MG PO TABS: 5.0000 mg | ORAL_TABLET | Freq: Every day | ORAL | Status: DC

## 2013-03-31 NOTE — Progress Notes (Signed)
    Clinical Summary Mr. Volden is a 57 y.o.male 1. Chest pain -  Over last few months reports a new burning like pain in midchest, severe pain  W/ some SOB. This pain started approx 1 year ago. Only w/ exertion, often w/ mowing the yard, will sit down and rest and resolves. Has some paplitaitons with it. Feels like over last year comes on w/ lower levels of exertion, example now can mow 1/2 the yard and symptoms come on.   -CAD risk factors: HTN, HL,+ tobacco, maternal uncle MI 43, father CABG in his 44s.   - no orthopnea, no PND, no LE edema  2. HTN -typically 140/90 at home - reports compliant w/ his medications  3. HL - managed by his pcp. reports abdominal pain on pravastatin. Switched to simva which he is tolerating.  - no recent panel in our sysetm.   Past Medical History  Diagnosis Date  . Essential hypertension, benign   . Mixed hyperlipidemia   . Skin cancer      Allergies  Allergen Reactions  . Lisinopril Swelling    Dizziness, chest pain      Current Outpatient Prescriptions  Medication Sig Dispense Refill  . aspirin EC 81 MG tablet Take 81 mg by mouth daily.      . metoprolol tartrate (LOPRESSOR) 25 MG tablet Take 50 mg by mouth 2 (two) times daily.       . simvastatin (ZOCOR) 20 MG tablet Take 20 mg by mouth every evening.      Marland Kitchen amLODipine (NORVASC) 5 MG tablet Take 1 tablet (5 mg total) by mouth daily.  180 tablet  3   No current facility-administered medications for this visit.     No past surgical history on file.   Allergies  Allergen Reactions  . Lisinopril Swelling    Dizziness, chest pain       No family history on file.   Social History Mr. Borchardt reports that he has been smoking Cigarettes.  He has a 20 pack-year smoking history. He does not have any smokeless tobacco history on file. Mr. Stacks reports that  drinks alcohol.   Review of Systems 12 point ROS negative other than reported in HPI  Physical Examination Filed  Vitals:   03/31/13 0941  BP: 181/104  Pulse: 80   Filed Weights   03/31/13 0941  Weight: 174 lb 12 oz (79.266 kg)  Gen: resting comfortably, NAD HEENT: no scleral icterus, pupils equal round and reactive, no palptable cervical adenopathy CV Pulm: CTAB Abd: soft, NT, ND NABS, no hepatosplenomegaly Ext: warm, no edema.  Skin: warm, no rash Neuro: A&Ox3, no focal deficits    Diagnostic Studies  03/31/13 EKG: sinus rate 75, normal axis, LAE, no ischemic changes    Assessment and Plan   1. Chest pain: history very consistent w/ angina in a patient with multiple CAD risk factors. Very high pretest probability for obstructive CAD, stress testing would not lower this probability to an adequate level. Discussed options with patient and he agrees to invasive testing.  - will obtain 2D echocardiogram - obtain left heart cath w/ coronary angiography to define his anatomy - added norvasc as a second antianginal medication  2. HTN - elevated in clinic today, typically better control at home -adding norvasc for both htn and antianginal effects  3. HL: check panel, pending results or diagnosis of CAD may need more aggressive therapy.     Antoine Poche, M.D., F.A.C.C.

## 2013-03-31 NOTE — Patient Instructions (Addendum)
Your physician recommends that you schedule a follow-up appointment in: POST CATH  Your physician recommends that you return for lab work in: TODAY (SLIPS PROVIDED)  Your physician has requested that you have a cardiac catheterization. Cardiac catheterization is used to diagnose and/or treat various heart conditions. Doctors may recommend this procedure for a number of different reasons. The most common reason is to evaluate chest pain. Chest pain can be a symptom of coronary artery disease (CAD), and cardiac catheterization can show whether plaque is narrowing or blocking your heart's arteries. This procedure is also used to evaluate the valves, as well as measure the blood flow and oxygen levels in different parts of your heart. For further information please visit [email or web address] Please follow instruction sheet, as given.04-07-13 AT 10AM   Your physician has requested that you have an echocardiogram. Echocardiography is a painless test that uses sound waves to create images of your heart. It provides your doctor with information about the size and shape of your heart and how well your heart's chambers and valves are working. This procedure takes approximately one hour. There are no restrictions for this procedure.  WE WILL CALL YOU WITH ALL TEST RESULTS  Your physician has recommended you make the following change in your medication:   1) START NORVASC 5MG  ONCE DAILY

## 2013-04-01 ENCOUNTER — Encounter (HOSPITAL_COMMUNITY): Payer: Self-pay | Admitting: Pharmacy Technician

## 2013-04-01 ENCOUNTER — Encounter: Payer: Self-pay | Admitting: Cardiology

## 2013-04-01 ENCOUNTER — Ambulatory Visit: Payer: Self-pay | Admitting: Cardiology

## 2013-04-01 ENCOUNTER — Other Ambulatory Visit: Payer: Self-pay | Admitting: Cardiology

## 2013-04-01 DIAGNOSIS — R079 Chest pain, unspecified: Secondary | ICD-10-CM

## 2013-04-04 ENCOUNTER — Ambulatory Visit (HOSPITAL_COMMUNITY)
Admission: RE | Admit: 2013-04-04 | Discharge: 2013-04-04 | Disposition: A | Discharge status: Home / Self Care | Payer: No Typology Code available for payment source | Source: Ambulatory Visit | Attending: Cardiology | Admitting: Cardiology

## 2013-04-04 ENCOUNTER — Telehealth: Payer: Self-pay | Admitting: *Deleted

## 2013-04-04 DIAGNOSIS — F17200 Nicotine dependence, unspecified, uncomplicated: Secondary | ICD-10-CM | POA: Insufficient documentation

## 2013-04-04 DIAGNOSIS — I10 Essential (primary) hypertension: Secondary | ICD-10-CM | POA: Insufficient documentation

## 2013-04-04 DIAGNOSIS — R079 Chest pain, unspecified: Secondary | ICD-10-CM

## 2013-04-04 DIAGNOSIS — Z8249 Family history of ischemic heart disease and other diseases of the circulatory system: Secondary | ICD-10-CM | POA: Insufficient documentation

## 2013-04-04 DIAGNOSIS — I517 Cardiomegaly: Secondary | ICD-10-CM

## 2013-04-04 NOTE — Telephone Encounter (Signed)
Pt step son died in card acedent needs to r/s cath till week of 04/11/13/tmj

## 2013-04-04 NOTE — Progress Notes (Signed)
*  PRELIMINARY RESULTS* Echocardiogram 2D Echocardiogram has been performed.  Duane Adams 04/04/2013, 2:12 PM

## 2013-04-04 NOTE — Telephone Encounter (Signed)
Re-scheduled pt 04-11-13 at 7am with Dr Excell Seltzer, noted pt labs will be submit table at that time per labs collected within protocol of 2 weeks, Dr Wyline Mood made aware verbally, pt wife notified about change and will advise pt, if any further assistance needed the pt will call our office, per pt given instructions previously

## 2013-04-11 ENCOUNTER — Ambulatory Visit (HOSPITAL_COMMUNITY)
Admission: RE | Admit: 2013-04-11 | Discharge: 2013-04-12 | Disposition: A | Discharge status: Home / Self Care | Payer: No Typology Code available for payment source | Source: Ambulatory Visit | Attending: Cardiovascular Disease | Admitting: Cardiovascular Disease

## 2013-04-11 ENCOUNTER — Encounter (HOSPITAL_COMMUNITY): Payer: Self-pay | Admitting: General Practice

## 2013-04-11 ENCOUNTER — Encounter (HOSPITAL_COMMUNITY)
Admission: RE | Disposition: A | Discharge status: Home / Self Care | Payer: Self-pay | Source: Ambulatory Visit | Attending: Cardiovascular Disease

## 2013-04-11 DIAGNOSIS — I2510 Atherosclerotic heart disease of native coronary artery without angina pectoris: Secondary | ICD-10-CM | POA: Insufficient documentation

## 2013-04-11 DIAGNOSIS — R079 Chest pain, unspecified: Secondary | ICD-10-CM

## 2013-04-11 DIAGNOSIS — F17200 Nicotine dependence, unspecified, uncomplicated: Secondary | ICD-10-CM | POA: Insufficient documentation

## 2013-04-11 DIAGNOSIS — I209 Angina pectoris, unspecified: Secondary | ICD-10-CM | POA: Insufficient documentation

## 2013-04-11 DIAGNOSIS — I10 Essential (primary) hypertension: Secondary | ICD-10-CM | POA: Insufficient documentation

## 2013-04-11 DIAGNOSIS — E782 Mixed hyperlipidemia: Secondary | ICD-10-CM | POA: Insufficient documentation

## 2013-04-11 HISTORY — DX: Tobacco use: Z72.0

## 2013-04-11 HISTORY — DX: Atherosclerotic heart disease of native coronary artery without angina pectoris: I25.10

## 2013-04-11 HISTORY — PX: LEFT HEART CATHETERIZATION WITH CORONARY ANGIOGRAM: SHX5451

## 2013-04-11 HISTORY — PX: CORONARY ANGIOPLASTY WITH STENT PLACEMENT: SHX49

## 2013-04-11 LAB — POCT ACTIVATED CLOTTING TIME: Activated Clotting Time: 508 seconds

## 2013-04-11 SURGERY — LEFT HEART CATHETERIZATION WITH CORONARY ANGIOGRAM
Anesthesia: LOCAL

## 2013-04-11 MED ORDER — ONDANSETRON HCL 4 MG/2ML IJ SOLN: 4.0000 mg | Freq: Four times a day (QID) | INTRAMUSCULAR | Status: DC | PRN

## 2013-04-11 MED ORDER — ASPIRIN EC 81 MG PO TBEC
81.0000 mg | DELAYED_RELEASE_TABLET | Freq: Every day | ORAL | Status: DC
  Administered 2013-04-12: 11:00:00 81 mg via ORAL
  Filled 2013-04-11: qty 1

## 2013-04-11 MED ORDER — OXYCODONE-ACETAMINOPHEN 5-325 MG PO TABS
1.0000 | ORAL_TABLET | ORAL | Status: DC | PRN
  Administered 2013-04-11: 2 via ORAL
  Filled 2013-04-11: qty 2

## 2013-04-11 MED ORDER — BIVALIRUDIN 250 MG IV SOLR
INTRAVENOUS | Status: AC
  Filled 2013-04-11: qty 250

## 2013-04-11 MED ORDER — HEPARIN (PORCINE) IN NACL 2-0.9 UNIT/ML-% IJ SOLN
INTRAMUSCULAR | Status: AC
  Filled 2013-04-11: qty 1000

## 2013-04-11 MED ORDER — LIDOCAINE HCL (PF) 1 % IJ SOLN
INTRAMUSCULAR | Status: AC
  Filled 2013-04-11: qty 30

## 2013-04-11 MED ORDER — CLOPIDOGREL BISULFATE 75 MG PO TABS
75.0000 mg | ORAL_TABLET | Freq: Every day | ORAL | Status: DC
  Administered 2013-04-12: 75 mg via ORAL
  Filled 2013-04-11: qty 1

## 2013-04-11 MED ORDER — VERAPAMIL HCL 2.5 MG/ML IV SOLN
INTRAVENOUS | Status: AC
  Filled 2013-04-11: qty 2

## 2013-04-11 MED ORDER — HEPARIN SODIUM (PORCINE) 1000 UNIT/ML IJ SOLN
INTRAMUSCULAR | Status: AC
  Filled 2013-04-11: qty 1

## 2013-04-11 MED ORDER — SODIUM CHLORIDE 0.9 % IV SOLN: 250.0000 mL | INTRAVENOUS | Status: DC | PRN

## 2013-04-11 MED ORDER — MIDAZOLAM HCL 2 MG/2ML IJ SOLN
INTRAMUSCULAR | Status: AC
  Filled 2013-04-11: qty 2

## 2013-04-11 MED ORDER — METOPROLOL TARTRATE 50 MG PO TABS
50.0000 mg | ORAL_TABLET | Freq: Two times a day (BID) | ORAL | Status: DC
  Administered 2013-04-12: 50 mg via ORAL
  Filled 2013-04-11 (×3): qty 1

## 2013-04-11 MED ORDER — SODIUM CHLORIDE 0.9 % IV SOLN
250.0000 mL | INTRAVENOUS | Status: DC | PRN
  Administered 2013-04-11: 250 mL via INTRAVENOUS

## 2013-04-11 MED ORDER — FENTANYL CITRATE 0.05 MG/ML IJ SOLN
INTRAMUSCULAR | Status: AC
  Filled 2013-04-11: qty 2

## 2013-04-11 MED ORDER — SODIUM CHLORIDE 0.9 % IV SOLN: 1.0000 mL/kg/h | INTRAVENOUS | Status: AC

## 2013-04-11 MED ORDER — ACTIVE PARTNERSHIP FOR HEALTH OF YOUR HEART BOOK
Freq: Once | Status: AC
  Administered 2013-04-12
  Filled 2013-04-11: qty 1

## 2013-04-11 MED ORDER — SODIUM CHLORIDE 0.9 % IV SOLN: INTRAVENOUS | Status: DC

## 2013-04-11 MED ORDER — NITROGLYCERIN 0.2 MG/ML ON CALL CATH LAB
INTRAVENOUS | Status: AC
  Filled 2013-04-11: qty 1

## 2013-04-11 MED ORDER — SODIUM CHLORIDE 0.9 % IJ SOLN: 3.0000 mL | INTRAMUSCULAR | Status: DC | PRN

## 2013-04-11 MED ORDER — SIMVASTATIN 20 MG PO TABS
20.0000 mg | ORAL_TABLET | Freq: Every day | ORAL | Status: DC
  Administered 2013-04-11: 20 mg via ORAL
  Filled 2013-04-11 (×2): qty 1

## 2013-04-11 MED ORDER — ASPIRIN 81 MG PO CHEW
324.0000 mg | CHEWABLE_TABLET | ORAL | Status: AC
  Administered 2013-04-11: 324 mg via ORAL
  Filled 2013-04-11: qty 4

## 2013-04-11 MED ORDER — ACETAMINOPHEN 325 MG PO TABS: 650.0000 mg | ORAL_TABLET | ORAL | Status: DC | PRN

## 2013-04-11 MED ORDER — SODIUM CHLORIDE 0.9 % IJ SOLN
3.0000 mL | Freq: Two times a day (BID) | INTRAMUSCULAR | Status: DC
  Administered 2013-04-11: 3 mL via INTRAVENOUS

## 2013-04-11 MED ORDER — SODIUM CHLORIDE 0.9 % IJ SOLN: 3.0000 mL | Freq: Two times a day (BID) | INTRAMUSCULAR | Status: DC

## 2013-04-11 MED ORDER — HYDRALAZINE HCL 20 MG/ML IJ SOLN
10.0000 mg | INTRAMUSCULAR | Status: DC | PRN
  Administered 2013-04-11 (×2): 10 mg via INTRAVENOUS
  Filled 2013-04-11 (×2): qty 1

## 2013-04-11 NOTE — Progress Notes (Signed)
Called by RN re: elevated bp Pt SBP 190, he had pm dose of metoprolol almost 1 hr ago. HR 60s. VS reviewed, he has taken all home meds and BP range 145-190.  Will order PRN hydralazine, will leave other med changes to MD.

## 2013-04-11 NOTE — H&P (View-Only) (Signed)
    Clinical Summary [name] is a 57 y.o.male 1. Chest pain -  Over last few months reports a new burning like pain in midchest, severe pain  W/ some SOB. This pain started approx 1 year ago. Only w/ exertion, often w/ mowing the yard, will sit down and rest and resolves. Has some paplitaitons with it. Feels like over last year comes on w/ lower levels of exertion, example now can mow 1/2 the yard and symptoms come on.   -CAD risk factors: HTN, HL,+ tobacco, maternal uncle MI 42, father CABG in his 50s.   - no orthopnea, no PND, no LE edema  2. HTN -typically 140/90 at home - reports compliant w/ his medications  3. HL - managed by his pcp. reports abdominal pain on pravastatin. Switched to simva which he is tolerating.  - no recent panel in our sysetm.   Past Medical History  Diagnosis Date  . Essential hypertension, benign   . Mixed hyperlipidemia   . Skin cancer      Allergies  Allergen Reactions  . Lisinopril Swelling    Dizziness, chest pain      Current Outpatient Prescriptions  Medication Sig Dispense Refill  . aspirin EC 81 MG tablet Take 81 mg by mouth daily.      . metoprolol tartrate (LOPRESSOR) 25 MG tablet Take 50 mg by mouth 2 (two) times daily.       . simvastatin (ZOCOR) 20 MG tablet Take 20 mg by mouth every evening.      . amLODipine (NORVASC) 5 MG tablet Take 1 tablet (5 mg total) by mouth daily.  180 tablet  3   No current facility-administered medications for this visit.     No past surgical history on file.   Allergies  Allergen Reactions  . Lisinopril Swelling    Dizziness, chest pain       No family history on file.   Social History [name] reports that he has been smoking Cigarettes.  He has a 20 pack-year smoking history. He does not have any smokeless tobacco history on file. [name] reports that  drinks alcohol.   Review of Systems 12 point ROS negative other than reported in HPI  Physical Examination Filed  Vitals:   03/31/13 0941  BP: 181/104  Pulse: 80   Filed Weights   03/31/13 0941  Weight: 174 lb 12 oz (79.266 kg)  Gen: resting comfortably, NAD HEENT: no scleral icterus, pupils equal round and reactive, no palptable cervical adenopathy CV Pulm: CTAB Abd: soft, NT, ND NABS, no hepatosplenomegaly Ext: warm, no edema.  Skin: warm, no rash Neuro: A&Ox3, no focal deficits    Diagnostic Studies  03/31/13 EKG: sinus rate 75, normal axis, LAE, no ischemic changes    Assessment and Plan   1. Chest pain: history very consistent w/ angina in a patient with multiple CAD risk factors. Very high pretest probability for obstructive CAD, stress testing would not lower this probability to an adequate level. Discussed options with patient and he agrees to invasive testing.  - will obtain 2D echocardiogram - obtain left heart cath w/ coronary angiography to define his anatomy - added norvasc as a second antianginal medication  2. HTN - elevated in clinic today, typically better control at home -adding norvasc for both htn and antianginal effects  3. HL: check panel, pending results or diagnosis of CAD may need more aggressive therapy.     Duane Adams, M.D., F.A.C.C.  

## 2013-04-11 NOTE — Interval H&P Note (Signed)
History and Physical Interval Note:  04/11/2013 11:24 AM  Duane Adams  has presented today for surgery, with the diagnosis of cp  The various methods of treatment have been discussed with the patient and family. After consideration of risks, benefits and other options for treatment, the patient has consented to  Procedure(s): LEFT HEART CATHETERIZATION WITH CORONARY ANGIOGRAM (N/A) as a surgical intervention .  The patient's history has been reviewed, patient examined, no change in status, stable for surgery.  I have reviewed the patient's chart and labs.  Questions were answered to the patient's satisfaction.    Cath Lab Visit (complete for each Cath Lab visit)  Clinical Evaluation Leading to the Procedure:   ACS: no  Non-ACS:    Anginal Classification: CCS III  Anti-ischemic medical therapy: Maximal Therapy (2 or more classes of medications)  Non-Invasive Test Results: No non-invasive testing performed  Prior CABG: No previous CABG         Tonny Bollman

## 2013-04-11 NOTE — CV Procedure (Signed)
   Cardiac Catheterization Procedure Note  Name: Duane Adams MRN: [id number] DOB: 10-06-1955  Procedure: Left Heart Cath, Selective Coronary Angiography, LV angiography, PTCA and stenting of the mid-LAD, PTCA and stenting of the proximal RCA  Indication: CCS Class 3 angina, progressive symptoms of angina now with low-level activity  Procedural Details:  The right wrist was prepped, draped, and anesthetized with 1% lidocaine. Using the modified Seldinger technique, a 5/6 French slender sheath was introduced into the right radial artery. 3 mg of verapamil was administered through the sheath, weight-based unfractionated heparin was administered intravenously. Standard Judkins catheters were used for selective coronary angiography and left ventriculography. Catheter exchanges were performed over an exchange length guidewire.  PROCEDURAL FINDINGS Hemodynamics: AO 178/93 LV 177/18   Coronary angiography: Coronary dominance: right  Left mainstem: Widely patent without obstructive disease  Left anterior descending (LAD): severe mid-vessel stenosis (80%) between the first and second septal perforators. The vessel reaches the LV apex and there is no other significant disease noted. Large D1 is patent.  Left circumflex (LCx): small vessel, supplies 2 OM branches. Mild irregularity noted.   Right coronary artery (RCA): large, dominant vessel. There is severe 90% proximal stenosis. Irregularity in the mid-vessel without significant stenosis. The PDA and PLA branches are both large without significant disease.  Left ventriculography: Left ventricular systolic function is normal, LVEF is estimated at 55-65%, there is no significant mitral regurgitation   PCI Note:  Following the diagnostic procedure, the decision was made to proceed with PCI. The patient was loaded with plavix 600 mg.  Weight-based bivalirudin was given for anticoagulation. I planned on treating the LAD and RCA, both of which have  high-grade disease. Once a therapeutic ACT was achieved, a 6 [place] XB-LAD guide catheter was inserted.  A cougar coronary guidewire was used to cross the lesion.  The lesion was predilated with a 2.0 mm balloon.  The lesion was then stented with a 2.5x20 mm Promus Premier drug-eluting stent.  The stent was postdilated with a 2.75 mm noncompliant balloon.  Following PCI, there was 0% residual stenosis and TIMI-3 flow. Attention was then turned to the RCA. A JR-4 guide was used. The same cougar guidewire was used to cross the lesion. The lesion was dilated with a 2.0 mm balloon and stented with a 3.0x20 mm Promus Premier DES. The stent was post-dilated to 18 atm with a 3.25 mm Rutledge balloon. Final angiography confirmed an excellent result. The patient tolerated the procedure well. There were no immediate procedural complications. A TR band was used for radial hemostasis. The patient was transferred to the post catheterization recovery area for further monitoring.  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  Tonny Bollman 04/11/2013, 12:35 PM

## 2013-04-11 NOTE — Progress Notes (Signed)
Pt c/o seeing colorful shapes on his right eye peripheral vision he called it " like a kaleidoscope" & headache . V/S stable BP= 150/79;  HR=86; R=18; O2sats=96 on RA. Pt states that he had multiple episodes like this at home & it will just go away if he rested. Dr Adolm Joseph informed.  Percocet 2 tabs given for headache as PRN dose. Will continue to monitor pt.

## 2013-04-12 ENCOUNTER — Encounter (HOSPITAL_COMMUNITY): Payer: Self-pay | Admitting: Nurse Practitioner

## 2013-04-12 DIAGNOSIS — I10 Essential (primary) hypertension: Secondary | ICD-10-CM

## 2013-04-12 DIAGNOSIS — I2510 Atherosclerotic heart disease of native coronary artery without angina pectoris: Secondary | ICD-10-CM

## 2013-04-12 LAB — CBC
HCT: 48 % (ref 39.0–52.0)
Hemoglobin: 17 g/dL (ref 13.0–17.0)
MCHC: 35.4 g/dL (ref 30.0–36.0)
MCV: 90.6 fL (ref 78.0–100.0)
Platelets: 202 10*3/uL (ref 150–400)
RBC: 5.3 MIL/uL (ref 4.22–5.81)
RDW: 14.3 % (ref 11.5–15.5)

## 2013-04-12 LAB — BASIC METABOLIC PANEL
BUN: 10 mg/dL (ref 6–23)
Chloride: 101 mEq/L (ref 96–112)
Creatinine, Ser: 0.79 mg/dL (ref 0.50–1.35)
GFR calc Af Amer: >90 mL/min (ref >90)
Glucose, Bld: 95 mg/dL (ref 70–99)
Potassium: 3.5 mEq/L (ref 3.5–5.1)
Sodium: 138 mEq/L (ref 135–145)

## 2013-04-12 MED ORDER — AMLODIPINE BESYLATE 5 MG PO TABS: 5.0000 mg | ORAL_TABLET | Freq: Every day | ORAL | Status: DC

## 2013-04-12 MED ORDER — AMLODIPINE BESYLATE 5 MG PO TABS
5.0000 mg | ORAL_TABLET | Freq: Every day | ORAL | Status: DC
  Administered 2013-04-12: 5 mg via ORAL
  Filled 2013-04-12: qty 1

## 2013-04-12 MED ORDER — NITROGLYCERIN 0.4 MG SL SUBL: 0.4000 mg | SUBLINGUAL_TABLET | SUBLINGUAL | Status: DC | PRN

## 2013-04-12 MED ORDER — CLOPIDOGREL BISULFATE 75 MG PO TABS: 75.0000 mg | ORAL_TABLET | Freq: Every day | ORAL | Status: DC

## 2013-04-12 MED FILL — Sodium Chloride IV Soln 0.9%: INTRAVENOUS | Qty: 50 | Status: AC

## 2013-04-12 NOTE — Discharge Summary (Signed)
Patient ID: BRAYTON BAUMGARTNER,  MRN: [id number], DOB/AGE: 1955-10-05 57 y.o.  Admit date: 04/11/2013 Discharge date: 04/12/2013  Primary Care Provider: Willow Ora Primary Cardiologist: Dominga Ferry, MD   Discharge Diagnoses Principal Problem:   Unstable angina   **s/p PCI/DES to the RCA and LAD this admission.  Active Problems:   Coronary artery disease   Mixed hyperlipidemia   Tobacco abuse   Essential hypertension   Allergies Allergies  Allergen Reactions  . Lisinopril Swelling    Dizziness, chest pain     Procedures  Cardiac Catheterization and Percutaneous Coronary Intervention 9.29.2014  Hemodynamics: AO 178/93 LV 177/18              Coronary angiography: Coronary dominance: right  Left mainstem: Widely patent without obstructive disease Left anterior descending (LAD): severe mid-vessel stenosis (80%) between the first and second septal perforators. The vessel reaches the LV apex and there is no other significant disease noted. Large D1 is patent.   **The LAD was successfully stented using a 2.5 x 20 mm Promus Premier drug-eluting stent.**  Left circumflex (LCx): small vessel, supplies 2 OM branches. Mild irregularity noted.   Right coronary artery (RCA): large, dominant vessel. There is severe 90% proximal stenosis. Irregularity in the mid-vessel without significant stenosis. The PDA and PLA branches are both large without significant disease.   **The right coronary artery was successfully stented using a 3.0 x 20 mm Promus Premier drug-eluting stent.**  Left ventriculography: Left ventricular systolic function is normal, LVEF is estimated at 55-65%, there is no significant mitral regurgitation   Final Conclusions:   Severe 2 vessel CAD with successful PCI of the LAD and RCA  Normal LV function   Recommendations:  ASA and plavix for at least 12 months. _____________   History of Present Illness  57 year old male without prior cardiac history. He was  recently seen in clinic secondary to progressive exertional angina. Given significant symptoms and risk factors concerning for coronary artery disease, decision was made to pursue diagnostic catheterization.  Hospital Course  Patient presented to the [facility] cath lab on September 29. He underwent diagnostic cardiac catheterization revealing severe LAD and right coronary artery disease. LV function was normal. He underwent successful PCI and drug-eluting stent placement to both the LAD and right coronary artery without complication. Post procedure, patient was noted to be intermittently hypertensive requiring when necessary IV hydralazine. In the setting of hypertension, he did develop headache and chest discomfort, which both resolved with blood pressure management.  This morning, he's been ambulating without recurrent symptoms or limitations. In addition to beta blocker therapy, we have added calcium channel blocker therapy for management of his hypertension. He will be discharged home today in good condition.  Discharge Vitals Blood pressure 141/76, pulse 94, temperature 98.4 F (36.9 C), temperature source Oral, resp. rate 20, height 5\' 9"  (1.753 m), weight 171 lb 11.8 oz (77.9 kg), SpO2 95.00%.  Filed Weights   04/11/13 0651 04/12/13 0046  Weight: 170 lb (77.111 kg) 171 lb 11.8 oz (77.9 kg)   Labs  CBC  Recent Labs  04/12/13 0611  WBC 12.2*  HGB 17.0  HCT 48.0  MCV 90.6  PLT 202   Basic Metabolic Panel  Recent Labs  04/12/13 0611  NA 138  K 3.5  CL 101  CO2 25  GLUCOSE 95  BUN 10  CREATININE 0.79  CALCIUM 9.4   Disposition  Pt is being discharged home today in good condition.  Follow-up Plans &  Appointments  Follow-up Information   Follow up with Antoine Poche, MD On 04/21/2013. (2:00 PM)    Contact information:   [facility] [place] Office [phone]      Follow up with Willow Ora, PA-C. (as scheduled)    Specialty:  Physician Assistant     Contact information:   [facility] [address] [place] [address] [phone]      Discharge Medications    Medication List         amLODipine 5 MG tablet  Commonly known as:  NORVASC  Take 1 tablet (5 mg total) by mouth daily.     aspirin EC 81 MG tablet  Take 81 mg by mouth daily.     clopidogrel 75 MG tablet  Commonly known as:  PLAVIX  Take 1 tablet (75 mg total) by mouth daily with breakfast.     metoprolol 50 MG tablet  Commonly known as:  LOPRESSOR  Take 50 mg by mouth 2 (two) times daily.     multivitamin tablet  Take 1 tablet by mouth daily.     simvastatin 20 MG tablet  Commonly known as:  ZOCOR  Take 20 mg by mouth every morning.       Outstanding Labs/Studies  None  Duration of Discharge Encounter   Greater than 30 minutes including physician time.  Signed, Nicolasa Ducking NP 04/12/2013, 7:58 AM

## 2013-04-12 NOTE — Care Management Note (Signed)
    Page 1 of 1   04/12/2013     3:28:08 PM   CARE MANAGEMENT NOTE 04/12/2013  Patient:  Duane Adams, Duane Adams   Account Number:  [id number]  Date Initiated:  04/12/2013  Documentation initiated by:  [facility]  Subjective/Objective Assessment:   57 yo male admitted with chest pain//homeless     Action/Plan:   cardiac cath//return to [facility]; MATCH   Anticipated DC Date:  04/12/2013   Anticipated DC Plan:  HOME/SELF CARE      DC Planning Services  CM consult  MATCH Program      Choice offered to / List presented to:             Status of service:   Medicare Important Message given?   (If response is "NO", the following Medicare IM given date fields will be blank) Date Medicare IM given:   Date Additional Medicare IM given:    Discharge Disposition:    Per UR Regulation:    If discussed at Long Length of Stay Meetings, dates discussed:    Comments:  04/12/13 1115 Oletta Cohn, RN, BSN, [employer] [phone] Spoke with pt at bedside regarding medication assistance and MATCH program.  Pt utilizes [facility] for prescription needs.  NCM called pharmacy to confirm availability of medication. Dannette Barbara, RN (congregational nursing)- she suggested having pt fill medications prior to returning [place] because it would take 15-20 days for clinic to process.  MATCH card given to pt prior to d/c home. Pt will fill inital medication at [facility].  Pt verbalizes importance of filling medication upon discharge.

## 2013-04-12 NOTE — Progress Notes (Addendum)
CARDIAC REHAB PHASE I   PRE:  Rate/Rhythm: 86 SR    BP: sitting 164/83    SaO2:   MODE:  Ambulation: 550 ft   POST:  Rate/Rhythm: 102 ST    BP: sitting 166/97, 179/107 10 min later, 183/86 10 min later     SaO2:   Pt dizzy upon standing but this resolved. Sts he continues to have a light constant squeeze around his heart but nothing like his angina with exertion.  BP elevated after walk. Still has slight HA. Ed completed. Pt is homeless. He stays with his girlfriend at times. Does not seem concerned about his living situation. Sts there is not a shelter in [place]. Gets food from the food bank. Highly encouraged pt to take his meds and quit smoking. Pt sts he plans to quit smoking. Interested in [facility] and will send referral to [facility]. To check with financial aid about coverage. [phone]  Elissa Lovett [place] CES, ACSM 04/12/2013 8:57 AM

## 2013-04-12 NOTE — Progress Notes (Signed)
Patient Name: Duane Adams Date of Encounter: 04/12/2013     Principal Problem:   Unstable angina Active Problems:   Coronary artery disease   Mixed hyperlipidemia   Tobacco abuse   Essential hypertension   SUBJECTIVE  Hypertensive last night.  HTN assoc w/ headache and chest discomfort.  Both resolved following hydralazine and percocet.  No recurrence since 11 PM.   CURRENT MEDS . aspirin EC  81 mg Oral Daily  . clopidogrel  75 mg Oral Q breakfast  . metoprolol  50 mg Oral BID  . simvastatin  20 mg Oral QHS  . sodium chloride  3 mL Intravenous Q12H    OBJECTIVE  Filed Vitals:   04/11/13 2216 04/11/13 2325 04/12/13 0046 04/12/13 0550  BP: 165/66 150/79 141/68 141/76  Pulse:   90 94  Temp:   98 F (36.7 C) 98.4 F (36.9 C)  TempSrc:   Oral Oral  Resp: 18 18 20 20   Height:      Weight:   171 lb 11.8 oz (77.9 kg)   SpO2: 96% 96% 97% 95%    Intake/Output Summary (Last 24 hours) at 04/12/13 0735 Last data filed at 04/12/13 0654  Gross per 24 hour  Intake 1000.43 ml  Output   1075 ml  Net -74.57 ml   Filed Weights   04/11/13 0651 04/12/13 0046  Weight: 170 lb (77.111 kg) 171 lb 11.8 oz (77.9 kg)    PHYSICAL EXAM  General: Pleasant, NAD. Neuro: Alert and oriented X 3. Moves all extremities spontaneously. Psych: Normal affect. HEENT:  Normal  Neck: Supple without bruits or JVD. Lungs:  Resp regular and unlabored, CTA. Heart: RRR no s3, s4, or murmurs. Abdomen: Soft, non-tender, non-distended, BS + x 4.  Extremities: No clubbing, cyanosis or edema. DP/PT/Radials 2+ and equal bilaterally.  R wrist cath site w/o bleeding/bruit/hematoma.  Accessory Clinical Findings  CBC  Recent Labs  04/12/13 0611  WBC 12.2*  HGB 17.0  HCT 48.0  MCV 90.6  PLT 202   Basic Metabolic Panel  Recent Labs  04/12/13 0611  NA 138  K 3.5  CL 101  CO2 25  GLUCOSE 95  BUN 10  CREATININE 0.79  CALCIUM 9.4    TELE  rsr  ECG  Rsr, 92, no acute st/t  changes.  Radiology/Studies  No results found.  ASSESSMENT AND PLAN  1.  USA/CAD:  Episodic c/p overnight in the setting of hypertension.  No c/p this AM.  BP better.  ECG stable.  Cont asa, plavix, bb, statin.  Reports intolerance to lisinopril (chest pain).  Will add amlodipine with ongoing HTN req a total of 20mg  IV hydralazine overnight.  2.  HTN:  Received 10mg  of IV hydralazine @ 17:58 and 21:52.  Will add amlodipine to his daily regimen. Cont bb.  3.  HL:  LDL 144 on 9/18.  He is on simva 20 @ home.  Dose limited by prior h/o abdominal pain while on pravastatin.  4.  Tob Abuse:  Cessation advised.  He knows that he needs to quit and is motivated.  Signed, Nicolasa Ducking NP  Patient seen, examined. Available data reviewed. Agree with findings, assessment, and plan as outlined by Ward Givens, NP. Exam reveals clear right radial site, clear lung fields, and heart RRR without murmur or gallop. There is no peripheral edema. EKG shows NSR within normal limits. Reviewed importance of med adherence with ASA, plavix, statin, and antihypertensive drugs. He was not taking meds as  prescribed before the procedure. Needs close follow-up with Dr Wyline Mood. Importance of complete tobacco cessation reviewed.  Tonny Bollman, M.D. 04/12/2013 8:18 AM

## 2013-04-21 ENCOUNTER — Encounter: Payer: Self-pay | Admitting: Cardiology

## 2013-04-21 ENCOUNTER — Ambulatory Visit (INDEPENDENT_AMBULATORY_CARE_PROVIDER_SITE_OTHER): Payer: No Typology Code available for payment source | Admitting: Cardiology

## 2013-04-21 VITALS — BP 147/88 | HR 87 | Ht 69.0 in | Wt 170.0 lb

## 2013-04-21 DIAGNOSIS — I25810 Atherosclerosis of coronary artery bypass graft(s) without angina pectoris: Secondary | ICD-10-CM

## 2013-04-21 MED ORDER — METOPROLOL TARTRATE 25 MG PO TABS: 25.0000 mg | ORAL_TABLET | Freq: Two times a day (BID) | ORAL | Status: DC

## 2013-04-21 MED ORDER — ATORVASTATIN CALCIUM 80 MG PO TABS: 80.0000 mg | ORAL_TABLET | Freq: Every day | ORAL | Status: DC

## 2013-04-21 NOTE — Progress Notes (Signed)
Clinical Summary Duane Adams is a 57 y.o.male  1. CAD - recent cath 04/11/13 showed significant LAD and RCA disease, s/p DES to both - describes some mild discomfort, but much improved since cath. Walking 1 mile a day without significant discomfort  compliant w/ meds including ASA and plavix - echo 03/2013 with normal LVEF - on simva 20, reports significant side effects to pravastain before. Has tolerated simva well.  - describes some feelings of generalized fatigue, notes he was previously only taking metop once a day before, now taking bid as originally prescribed - hx of angioedema on lisinopril  2. HL - 03/31/13 TC 230 TG 194 HDL 47  LDL 144 - on simva 20mg  daily   3. HTN -checks bp every 2 days, typically 130/80s - compliant with medications  4. Tobacco  - down to 1/2 pack - prevoiusly was using e cigarette, has nicotine gum which helps.   Past Medical History  Diagnosis Date  . Essential hypertension, benign   . Mixed hyperlipidemia   . Skin cancer   . Coronary artery disease     a. 03/2013 Cath/PCI: LM nl, LAD 29m (2.5x20 Promus Premier DES), LCX min irregs, RCA dom, 90p(3.0x20 Promus Premier DES), EF 55-65%.  . Tobacco abuse      Allergies  Allergen Reactions  . Lisinopril Swelling    Dizziness, chest pain      Current Outpatient Prescriptions  Medication Sig Dispense Refill  . amLODipine (NORVASC) 5 MG tablet Take 1 tablet (5 mg total) by mouth daily.  30 tablet  6  . aspirin EC 81 MG tablet Take 81 mg by mouth daily.      . clopidogrel (PLAVIX) 75 MG tablet Take 1 tablet (75 mg total) by mouth daily with breakfast.  30 tablet  6  . metoprolol (LOPRESSOR) 50 MG tablet Take 50 mg by mouth 2 (two) times daily.      . Multiple Vitamin (MULTIVITAMIN) tablet Take 1 tablet by mouth daily.      . nitroGLYCERIN (NITROSTAT) 0.4 MG SL tablet Place 1 tablet (0.4 mg total) under the tongue every 5 (five) minutes as needed for chest pain.  25 tablet  3  . simvastatin  (ZOCOR) 20 MG tablet Take 20 mg by mouth every morning.        No current facility-administered medications for this visit.     Past Surgical History  Procedure Laterality Date  . Coronary angioplasty with stent placement  04/11/2013    LAD &  RCA     DR Duane Adams  . Hand reconstruction Left   . Leg tendon surgery Right      Allergies  Allergen Reactions  . Lisinopril Swelling    Dizziness, chest pain       No family history on file.   Social History [name] reports that he has been smoking Cigarettes.  He has a 20 pack-year smoking history. He has never used smokeless tobacco. [name] reports that he drinks alcohol.   Review of Systems CONSTITUTIONAL: No weight loss, fever, chills, weakness or fatigue.  HEENT: Eyes: No visual loss, blurred vision, double vision or yellow sclerae.No hearing loss, sneezing, congestion, runny nose or sore throat.  SKIN: No rash or itching.  CARDIOVASCULAR: per HPI RESPIRATORY: per HPI.  GASTROINTESTINAL: No anorexia, nausea, vomiting or diarrhea. No abdominal pain or blood.  GENITOURINARY: No burning on urination, no polyuria NEUROLOGICAL: No headache, dizziness, syncope, paralysis, ataxia, numbness or tingling in the extremities.  No change in bowel or bladder control.  MUSCULOSKELETAL: No muscle, back pain, joint pain or stiffness.  LYMPHATICS: No enlarged nodes. No history of splenectomy.  PSYCHIATRIC: No history of depression or anxiety.  ENDOCRINOLOGIC: No reports of sweating, cold or heat intolerance. No polyuria or polydipsia.  Marland Kitchen   Physical Examination Filed Vitals:   04/21/13 1400  BP: 147/88  Pulse: 87   Filed Weights   04/21/13 1400  Weight: 170 lb (77.111 kg)    Gen: resting comfortably, no acute distress HEENT: no scleral icterus, pupils equal round and reactive, no palptable cervical adenopathy,  CV: RRR, no m/r/g, no JVD, no carotid bruits Resp: Clear to auscultation bilaterally GI: abdomen is soft,  non-tender, non-distended, normal bowel sounds, no hepatosplenomegaly MSK: extremities are warm, no edema.  Skin: warm, no rash Neuro:  no focal deficits Psych: appropriate affect   Diagnostic Studies 04/11/13 Cath PROCEDURAL FINDINGS  Hemodynamics:  AO 178/93  LV 177/18  Coronary angiography:  Coronary dominance: right  Left mainstem: Widely patent without obstructive disease  Left anterior descending (LAD): severe mid-vessel stenosis (80%) between the first and second septal perforators. The vessel reaches the LV apex and there is no other significant disease noted. Large D1 is patent.  Left circumflex (LCx): small vessel, supplies 2 OM branches. Mild irregularity noted.  Right coronary artery (RCA): large, dominant vessel. There is severe 90% proximal stenosis. Irregularity in the mid-vessel without significant stenosis. The PDA and PLA branches are both large without significant disease.  Left ventriculography: Left ventricular systolic function is normal, LVEF is estimated at 55-65%, there is no significant mitral regurgitation  PCI Note: Following the diagnostic procedure, the decision was made to proceed with PCI. The patient was loaded with plavix 600 mg. Weight-based bivalirudin was given for anticoagulation. I planned on treating the LAD and RCA, both of which have high-grade disease. Once a therapeutic ACT was achieved, a 6 [place] XB-LAD guide catheter was inserted. A cougar coronary guidewire was used to cross the lesion. The lesion was predilated with a 2.0 mm balloon. The lesion was then stented with a 2.5x20 mm Promus Premier drug-eluting stent. The stent was postdilated with a 2.75 mm noncompliant balloon. Following PCI, there was 0% residual stenosis and TIMI-3 flow. Attention was then turned to the RCA. A JR-4 guide was used. The same cougar guidewire was used to cross the lesion. The lesion was dilated with a 2.0 mm balloon and stented with a 3.0x20 mm Promus Premier DES. The  stent was post-dilated to 18 atm with a 3.25 mm [place] balloon. Final angiography confirmed an excellent result. The patient tolerated the procedure well. There were no immediate procedural complications. A TR band was used for radial hemostasis. The patient was transferred to the post catheterization recovery area for further monitoring.  PCI Data:  Lesion 1  Vessel - LAD/Segment - mid  Percent Stenosis (pre) 80  TIMI-flow 3  Stent 2.5x20 mm Promus Premier DES  Percent Stenosis (post) 0  TIMI-flow (post) 3  Lesion 2  Vessel - RCA/Segment - prox  Percent Stenosis (pre) 90  TIMI-flow 3  Stent 3.0x20 mm Promus Premier DES  Percent Stenosis (post) 0  TIMI-flow (post) 3  Final Conclusions:  Severe 2 vessel CAD with successful PCI of the LAD and RCA  Normal LV function  Recommendations:  ASA and plavix for at least 12 months.  04/04/13 Echo LVEF 60-65%, no WMA, grade I diastolic dysfunction,   Assessment and Plan  1. CAD -  s/p DES to LAD and DES to RCA in setting of angina - continue ASA indefinitely, plavix at least until 04/2014 - continue risk factor modification - will decrease metoprolol to 25mg  bid, suspect this could be the source of is generalized fatigue.  2. HTN - at goal per home numbers - follow bp with decrease in metoprolol, if needed can increased norvasc  3. HL - not at goal. Change to atorva 80mg  daily  4. Tobacco - encouraged to try nicotine patch w/ nicotine gum as needed - patient commits to being tobacco free by next visit in 2 months    Antoine Poche, M.D., F.A.C.C.

## 2013-04-21 NOTE — Patient Instructions (Addendum)
Your physician recommends that you schedule a follow-up appointment in: 2 MONTHS  Your physician recommends that you schedule a follow-up appointment in:  1) STOP TAKING SIMVASTATIN 2) START TAKING ATORVASTATIN 80MG  ONCE DAILY 3) DECREASE METOPROLOL TO 25MG  TWICE DAILY

## 2013-04-25 ENCOUNTER — Telehealth: Payer: Self-pay | Admitting: Cardiology

## 2013-04-25 NOTE — Telephone Encounter (Signed)
.  left message to have patient advocate  return my call.

## 2013-04-25 NOTE — Telephone Encounter (Signed)
Please call [facility] in regards to patient's Pravastatin being changed to Lipitor/tgs

## 2013-04-25 NOTE — Telephone Encounter (Signed)
Pt PCP noted pt has limited funds and wanted to know if he can be switched to crestor so samples can be provided, please advise

## 2013-04-26 NOTE — Telephone Encounter (Signed)
Recommend either atorvastatin 80mg  or crestor 20mg  daily based on current lipid guidelines for known CAD, which ever one is cheaper is fine.

## 2013-04-26 NOTE — Telephone Encounter (Signed)
Informed Karl Pock with pt PCP via [facility], offered to send pt new RX for Crestor 20mg  or give out samples, pt PCP noted they have the pt set up on the free medication program med assist and will write the new RX for the pt., pt will be made aware via PCP

## 2013-05-19 ENCOUNTER — Other Ambulatory Visit: Payer: Self-pay

## 2013-06-26 NOTE — Progress Notes (Signed)
Clinical Summary Duane Adams is a 57 y.o.male seen today in follow up for the following medical problems.  1. CAD  - recent cath 04/11/13 showed significant LAD and RCA disease, s/p DES to both  - echo 03/2013 with normal LVEF  - compliant w/ meds including ASA and plavix  - describes some feelings of generalized fatigue, he decreased his metoprolol to 25mg  once daily on his own with significant improvement in symptoms.  - hx of angioedema on lisinopril  - occasional sharp chest pain, feeling of heart skipping. + SOB. Lasts just 1-2 seconds. Occurs once a week. Can occur at rest or with exertion. Different from prior chest pain.   2. HL  - 03/31/13 TC 230 TG 194 HDL 47 LDL 144  - on crestor 20mg  daily, just recently started.   3. HTN  - compliant with medications  4. Tobacco  - down to 1/2 pack  - prevoiusly was using e cigarette but describes sore throat with use. , has nicotine gum which helps.    Past Medical History  Diagnosis Date  . Essential hypertension, benign   . Mixed hyperlipidemia   . Skin cancer   . Coronary artery disease     a. 03/2013 Cath/PCI: LM nl, LAD 42m (2.5x20 Promus Premier DES), LCX min irregs, RCA dom, 90p(3.0x20 Promus Premier DES), EF 55-65%.  . Tobacco abuse      Allergies  Allergen Reactions  . Lisinopril Swelling    Dizziness, chest pain      Current Outpatient Prescriptions  Medication Sig Dispense Refill  . amLODipine (NORVASC) 5 MG tablet Take 1 tablet (5 mg total) by mouth daily.  30 tablet  6  . aspirin EC 81 MG tablet Take 81 mg by mouth daily.      Marland Kitchen atorvastatin (LIPITOR) 80 MG tablet Take 1 tablet (80 mg total) by mouth daily.  90 tablet  3  . clopidogrel (PLAVIX) 75 MG tablet Take 1 tablet (75 mg total) by mouth daily with breakfast.  30 tablet  6  . metoprolol tartrate (LOPRESSOR) 25 MG tablet Take 1 tablet (25 mg total) by mouth 2 (two) times daily.  180 tablet  3  . Multiple Vitamin (MULTIVITAMIN) tablet Take 1 tablet by  mouth daily.      . nitroGLYCERIN (NITROSTAT) 0.4 MG SL tablet Place 1 tablet (0.4 mg total) under the tongue every 5 (five) minutes as needed for chest pain.  25 tablet  3  . simvastatin (ZOCOR) 20 MG tablet Take 20 mg by mouth every morning.        No current facility-administered medications for this visit.     Past Surgical History  Procedure Laterality Date  . 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 Hand reconstruction Left   . Leg tendon surgery Right      Allergies  Allergen Reactions  . Lisinopril Swelling    Dizziness, chest pain       No family history on file.   Social History Mr. Granzow reports that he has been smoking Cigarettes.  He has a 20 pack-year smoking history. He has never used smokeless tobacco. Mr. Krakowski reports that he drinks alcohol.   Review of Systems CONSTITUTIONAL: No weight loss, fever, chills, weakness or fatigue.  HEENT: Eyes: No visual loss, blurred vision, double vision or yellow sclerae.No hearing loss, sneezing, congestion, runny nose or sore throat.  SKIN: No rash or itching.  CARDIOVASCULAR: per HPI RESPIRATORY: No shortness of breath, cough or sputum.  GASTROINTESTINAL: No anorexia, nausea, vomiting or diarrhea. No abdominal pain or blood.  GENITOURINARY: No burning on urination, no polyuria NEUROLOGICAL: No headache, dizziness, syncope, paralysis, ataxia, numbness or tingling in the extremities. No change in bowel or bladder control.  MUSCULOSKELETAL: No muscle, back pain, joint pain or stiffness.  LYMPHATICS: No enlarged nodes. No history of splenectomy.  PSYCHIATRIC: No history of depression or anxiety.  ENDOCRINOLOGIC: No reports of sweating, cold or heat intolerance. No polyuria or polydipsia.  Marland Kitchen   Physical Examination p 75 bp 128/78 Wt 180 lbs BMI 26 Gen: resting comfortably, no acute distress HEENT: no scleral icterus, pupils equal round and reactive, no palptable  cervical adenopathy,  CV: RRR, no m/r/g, no JVD, no carotid bruits Resp: Clear to auscultation bilaterally GI: abdomen is soft, non-tender, non-distended, normal bowel sounds, no hepatosplenomegaly MSK: extremities are warm, no edema.  Skin: warm, no rash Neuro:  no focal deficits Psych: appropriate affect   Diagnostic Studies 04/11/13 Cath  PROCEDURAL FINDINGS  Hemodynamics:  AO 178/93  LV 177/18  Coronary angiography:  Coronary dominance: right  Left mainstem: Widely patent without obstructive disease  Left anterior descending (LAD): severe mid-vessel stenosis (80%) between the first and second septal perforators. The vessel reaches the LV apex and there is no other significant disease noted. Large D1 is patent.  Left circumflex (LCx): small vessel, supplies 2 OM branches. Mild irregularity noted.  Right coronary artery (RCA): large, dominant vessel. There is severe 90% proximal stenosis. Irregularity in the mid-vessel without significant stenosis. The PDA and PLA branches are both large without significant disease.  Left ventriculography: Left ventricular systolic function is normal, LVEF is estimated at 55-65%, there is no significant mitral regurgitation  PCI Note: Following the diagnostic procedure, the decision was made to proceed with PCI. The patient was loaded with plavix 600 mg. Weight-based bivalirudin was given for anticoagulation. I planned on treating the LAD and RCA, both of which have high-grade disease. Once a therapeutic ACT was achieved, a 6 [place] XB-LAD guide catheter was inserted. A cougar coronary guidewire was used to cross the lesion. The lesion was predilated with a 2.0 mm balloon. The lesion was then stented with a 2.5x20 mm Promus Premier drug-eluting stent. The stent was postdilated with a 2.75 mm noncompliant balloon. Following PCI, there was 0% residual stenosis and TIMI-3 flow. Attention was then turned to the RCA. A JR-4 guide was used. The same cougar  guidewire was used to cross the lesion. The lesion was dilated with a 2.0 mm balloon and stented with a 3.0x20 mm Promus Premier DES. The stent was post-dilated to 18 atm with a 3.25 mm [place] balloon. Final angiography confirmed an excellent result. The patient tolerated the procedure well. There were no immediate procedural complications. A TR band was used for radial hemostasis. The patient was transferred to the post catheterization recovery area for further monitoring.  PCI Data:  Lesion 1  Vessel - LAD/Segment - mid  Percent Stenosis (pre) 80  TIMI-flow 3  Stent 2.5x20 mm Promus Premier DES  Percent Stenosis (post) 0  TIMI-flow (post) 3  Lesion 2  Vessel - RCA/Segment - prox  Percent Stenosis (pre) 90  TIMI-flow 3  Stent 3.0x20 mm Promus Premier DES  Percent Stenosis (post) 0  TIMI-flow (post) 3  Final Conclusions:  Severe 2 vessel CAD with successful PCI of the LAD and RCA  Normal LV  function  Recommendations:  ASA and plavix for at least 12 months.   04/04/13 Echo  LVEF 60-65%, no WMA, grade I diastolic dysfunction   Assessment and Plan   1. CAD  - s/p DES to LAD and DES to RCA in setting of angina  - continue ASA indefinitely, plavix at least until 04/2014  - continue risk factor modification  - will decrease metoprolol to 12.5 mg bid, suspect this could be the source of is generalized fatigue.   2. HTN  - at goal, continue current medcs  3. HL  - recently switched to crestor 20mg  daily, tolerating well - recheck lipid panel in next few months  4. Palpitations - will obtain 14 day event monitor   5. Tobacco   - down to 1/2 pack, using nicotine gum prn.    F/u 1 month    Antoine Poche, M.D., F.A.C.C.

## 2013-06-27 ENCOUNTER — Encounter: Payer: Self-pay | Admitting: Cardiology

## 2013-06-27 ENCOUNTER — Ambulatory Visit (INDEPENDENT_AMBULATORY_CARE_PROVIDER_SITE_OTHER): Payer: No Typology Code available for payment source | Admitting: Cardiology

## 2013-06-27 VITALS — BP 128/78 | HR 75 | Ht 69.0 in | Wt 180.0 lb

## 2013-06-27 DIAGNOSIS — I10 Essential (primary) hypertension: Secondary | ICD-10-CM

## 2013-06-27 DIAGNOSIS — E785 Hyperlipidemia, unspecified: Secondary | ICD-10-CM

## 2013-06-27 DIAGNOSIS — I2510 Atherosclerotic heart disease of native coronary artery without angina pectoris: Secondary | ICD-10-CM

## 2013-06-27 MED ORDER — METOPROLOL TARTRATE 25 MG PO TABS: 12.5000 mg | ORAL_TABLET | Freq: Two times a day (BID) | ORAL | Status: DC

## 2013-06-27 NOTE — Patient Instructions (Signed)
Your physician recommends that you schedule a follow-up appointment in: ONE MONTH  Your physician has recommended that you wear an event monitor. Event monitors are medical devices that record the heart's electrical activity. Doctors most often [place] these monitors to diagnose arrhythmias. Arrhythmias are problems with the speed or rhythm of the heartbeat. The monitor is a small, portable device. You can wear one while you do your normal daily activities. This is usually used to diagnose what is causing palpitations/syncope (passing out).  WE WILL CALL YOU WITH YOUR TEST RESULTS/INSTRUCTIONS/NEXT STEPS ONCE RECEIVED BY THE PROVIDER   Your physician has recommended you make the following change in your medication:   1) START METOPROLOL 12.5MG  TWICE DAILY

## 2013-07-01 DIAGNOSIS — R002 Palpitations: Secondary | ICD-10-CM

## 2013-07-19 ENCOUNTER — Other Ambulatory Visit: Payer: Self-pay | Admitting: *Deleted

## 2013-07-19 DIAGNOSIS — R002 Palpitations: Secondary | ICD-10-CM

## 2013-08-05 ENCOUNTER — Ambulatory Visit (INDEPENDENT_AMBULATORY_CARE_PROVIDER_SITE_OTHER): Payer: No Typology Code available for payment source | Admitting: Cardiology

## 2013-08-05 VITALS — BP 158/92 | HR 77 | Ht 69.0 in | Wt 182.0 lb

## 2013-08-05 DIAGNOSIS — E782 Mixed hyperlipidemia: Secondary | ICD-10-CM

## 2013-08-05 DIAGNOSIS — R002 Palpitations: Secondary | ICD-10-CM

## 2013-08-05 DIAGNOSIS — I10 Essential (primary) hypertension: Secondary | ICD-10-CM

## 2013-08-05 DIAGNOSIS — I2510 Atherosclerotic heart disease of native coronary artery without angina pectoris: Secondary | ICD-10-CM

## 2013-08-05 LAB — LIPID PANEL
CHOLESTEROL: 154 mg/dL (ref 0–200)
HDL: 48 mg/dL (ref >39)
LDL Cholesterol: 65 mg/dL (ref 0–99)
Total CHOL/HDL Ratio: 3.2 Ratio
Triglycerides: 206 mg/dL — ABNORMAL HIGH (ref <150)
VLDL: 41 mg/dL — AB (ref 0–40)

## 2013-08-05 MED ORDER — CLOPIDOGREL BISULFATE 75 MG PO TABS: 75.0000 mg | ORAL_TABLET | Freq: Every day | ORAL | Status: DC

## 2013-08-05 MED ORDER — RANITIDINE HCL 150 MG PO TABS: 150.0000 mg | ORAL_TABLET | Freq: Two times a day (BID) | ORAL | Status: DC

## 2013-08-05 MED ORDER — AMLODIPINE BESYLATE 5 MG PO TABS: 5.0000 mg | ORAL_TABLET | Freq: Every day | ORAL | Status: DC

## 2013-08-05 NOTE — Patient Instructions (Signed)
Your physician recommends that you schedule a follow-up appointment in: [place] physician has recommended you make the following change in your medication:   1) START TAKING ZANTAC 150MG  TWICE DAILY  Your physician has requested that you regularly monitor and record your blood pressure readings at home. Please use the same machine at the same time of day to check your readings and record them to bring to your follow-up visit.BRING YOUR LOG WITH YOU AT YOUR FOLLOW UP VISIT  Your physician recommends that you return for lab work in: [place] ([place]

## 2013-08-05 NOTE — Progress Notes (Signed)
Clinical Summary Duane Adams is a 58 y.o.male seen today in follow up for the following medical problems.   1. CAD  - recent cath 04/11/13 showed significant LAD and RCA disease, s/p DES to both  - echo 03/2013 with normal LVEF  - compliant w/ meds including ASA and plavix  - describes some feelings of generalized fatigue that has gotten better with decreasing his metoprolol dose. Hx of angioedema on lisinopril   - describes constant burning feeling in midchest, lasts several hours out of the day  2. HL  - 03/31/13 TC 230 TG 194 HDL 47 LDL 144  - on crestor 71m daily, just recently started.   3. HTN  - compliant with medications    4. Tobacco  - down to 1/2 pack  - prevoiusly was using e cigarette but describes sore throat with use. , has nicotine gum which helps.   5. Palpitations - wore monitor recently without any signifcant arrhythmias, no recent symptoms.    Past Medical History  Diagnosis Date  . Essential hypertension, benign   . Mixed hyperlipidemia   . Skin cancer   . Coronary artery disease     a. 03/2013 Cath/PCI: LM nl, LAD 882m2.5x20 Promus Premier DES), LCX min irregs, RCA dom, 90p(3.0x20 Promus Premier DES), EF 55-65%.  . Tobacco abuse      Allergies  Allergen Reactions  . Lisinopril Swelling    Dizziness, chest pain      Current Outpatient Prescriptions  Medication Sig Dispense Refill  . amLODipine (NORVASC) 5 MG tablet Take 1 tablet (5 mg total) by mouth daily.  30 tablet  6  . aspirin EC 81 MG tablet Take 81 mg by mouth daily.      . clopidogrel (PLAVIX) 75 MG tablet Take 1 tablet (75 mg total) by mouth daily with breakfast.  30 tablet  6  . metoprolol tartrate (LOPRESSOR) 25 MG tablet Take 0.5 tablets (12.5 mg total) by mouth 2 (two) times daily.  90 tablet  3  . Multiple Vitamin (MULTIVITAMIN) tablet Take 1 tablet by mouth daily.      . nitroGLYCERIN (NITROSTAT) 0.4 MG SL tablet Place 1 tablet (0.4 mg total) under the tongue every 5 (five)  minutes as needed for chest pain.  25 tablet  3  . rosuvastatin (CRESTOR) 20 MG tablet Take 20 mg by mouth daily.       No current facility-administered medications for this visit.     Past Surgical History  Procedure Laterality Date  . 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 Hand reconstruction Left   . Leg tendon surgery Right      Allergies  Allergen Reactions  . Lisinopril Swelling    Dizziness, chest pain       No family history on file.   Social History Duane Adams that he has been smoking Cigarettes.  He has a 20 pack-year smoking history. He has never used smokeless tobacco. Duane Adams that he drinks alcohol.   Review of Systems CONSTITUTIONAL: No weight loss, fever, chills, weakness or fatigue.  HEENT: Eyes: No visual loss, blurred vision, double vision or yellow sclerae.No hearing loss, sneezing, congestion, runny nose or sore throat.  SKIN: No rash or itching.  CARDIOVASCULAR: per HPI RESPIRATORY: No shortness of breath, cough or sputum.  GASTROINTESTINAL: No anorexia, nausea, vomiting or diarrhea. No abdominal pain or blood.  GENITOURINARY: No burning on urination, no polyuria NEUROLOGICAL: No headache, dizziness, syncope, paralysis, ataxia, numbness or tingling in the extremities. No change in bowel or bladder control.  MUSCULOSKELETAL: No muscle, back pain, joint pain or stiffness.  LYMPHATICS: No enlarged nodes. No history of splenectomy.  PSYCHIATRIC: No history of depression or anxiety.  ENDOCRINOLOGIC: No reports of sweating, cold or heat intolerance. No polyuria or polydipsia.  Marland Kitchen   Physical Examination Filed Vitals:   08/05/13 0858  BP: 158/92  Pulse: 77   Filed Weights   08/05/13 0858  Weight: 182 lb (82.555 kg)   Manual bp 150/80 Gen: resting comfortably, no acute distress HEENT: no scleral icterus, pupils equal round and reactive, no palptable cervical adenopathy,  CV: RRR,  no m/r/g, no JVD, no carotid bruits Resp: Clear to auscultation bilaterally GI: abdomen is soft, non-tender, non-distended, normal bowel sounds, no hepatosplenomegaly MSK: extremities are warm, no edema.  Skin: warm, no rash Neuro:  no focal deficits Psych: appropriate affect   Diagnostic Studies 04/11/13 Cath  PROCEDURAL FINDINGS  Hemodynamics:  AO 178/93  LV 177/18  Coronary angiography:  Coronary dominance: right  Left mainstem: Widely patent without obstructive disease  Left anterior descending (LAD): severe mid-vessel stenosis (80%) between the first and second septal perforators. The vessel reaches the LV apex and there is no other significant disease noted. Large D1 is patent.  Left circumflex (LCx): small vessel, supplies 2 OM branches. Mild irregularity noted.  Right coronary artery (RCA): large, dominant vessel. There is severe 90% proximal stenosis. Irregularity in the mid-vessel without significant stenosis. The PDA and PLA branches are both large without significant disease.  Left ventriculography: Left ventricular systolic function is normal, LVEF is estimated at 55-65%, there is no significant mitral regurgitation  PCI Note: Following the diagnostic procedure, the decision was made to proceed with PCI. The patient was loaded with plavix 600 mg. Weight-based bivalirudin was given for anticoagulation. I planned on treating the LAD and RCA, both of which have high-grade disease. Once a therapeutic ACT was achieved, a 6 [place] XB-LAD guide catheter was inserted. A cougar coronary guidewire was used to cross the lesion. The lesion was predilated with a 2.0 mm balloon. The lesion was then stented with a 2.5x20 mm Promus Premier drug-eluting stent. The stent was postdilated with a 2.75 mm noncompliant balloon. Following PCI, there was 0% residual stenosis and TIMI-3 flow. Attention was then turned to the RCA. A JR-4 guide was used. The same cougar guidewire was used to cross the lesion.  The lesion was dilated with a 2.0 mm balloon and stented with a 3.0x20 mm Promus Premier DES. The stent was post-dilated to 18 atm with a 3.25 mm [place] balloon. Final angiography confirmed an excellent result. The patient tolerated the procedure well. There were no immediate procedural complications. A TR band was used for radial hemostasis. The patient was transferred to the post catheterization recovery area for further monitoring.  PCI Data:  Lesion 1  Vessel - LAD/Segment - mid  Percent Stenosis (pre) 80  TIMI-flow 3  Stent 2.5x20 mm Promus Premier DES  Percent Stenosis (post) 0  TIMI-flow (post) 3  Lesion 2  Vessel - RCA/Segment - prox  Percent Stenosis (pre) 90  TIMI-flow 3  Stent 3.0x20 mm Promus Premier DES  Percent Stenosis (post) 0  TIMI-flow (post) 3  Final Conclusions:  Severe 2 vessel CAD with successful PCI of the LAD and RCA  Normal LV function  Recommendations:  ASA and plavix for at  least 12 months.  04/04/13 Echo  LVEF 60-65%, no WMA, grade I diastolic dysfunction        Assessment and Plan  1. CAD  - s/p DES to LAD and DES to RCA in setting of angina  - continue ASA indefinitely, plavix at least until 04/2014  - continue risk factor modification  - burning like constant chest pain more suggestive of GI origin, will start zantac  2. HTN  - elevated in clinic, prevoius home numbers were at goal - continue to follow, he will bring bp log next visit.   3. HL  -  switched to crestor 41m daily a few months ago, tolerating well  - recheck lipid panel   4. Palpitations  - resolved symptoms - no significant arrythmias on recent monitor, continue to follow clinically  5. Tobacco  - down to 1/2 pack, using nicotine gum prn.    Follow up 3 months   JArnoldo Lenis M.D., F.A.C.C.

## 2013-10-06 ENCOUNTER — Encounter: Payer: Self-pay | Admitting: Cardiology

## 2013-10-28 ENCOUNTER — Emergency Department (HOSPITAL_COMMUNITY)
Admission: EM | Admit: 2013-10-28 | Discharge: 2013-10-28 | Disposition: A | Discharge status: Home / Self Care | Payer: Medicaid Other | Attending: Emergency Medicine | Admitting: Emergency Medicine

## 2013-10-28 ENCOUNTER — Encounter (HOSPITAL_COMMUNITY): Payer: Self-pay | Admitting: Emergency Medicine

## 2013-10-28 DIAGNOSIS — Y929 Unspecified place or not applicable: Secondary | ICD-10-CM | POA: Insufficient documentation

## 2013-10-28 DIAGNOSIS — I10 Essential (primary) hypertension: Secondary | ICD-10-CM | POA: Insufficient documentation

## 2013-10-28 DIAGNOSIS — Z7982 Long term (current) use of aspirin: Secondary | ICD-10-CM | POA: Insufficient documentation

## 2013-10-28 DIAGNOSIS — Y939 Activity, unspecified: Secondary | ICD-10-CM | POA: Insufficient documentation

## 2013-10-28 DIAGNOSIS — W260XXA Contact with knife, initial encounter: Secondary | ICD-10-CM | POA: Insufficient documentation

## 2013-10-28 DIAGNOSIS — F17200 Nicotine dependence, unspecified, uncomplicated: Secondary | ICD-10-CM | POA: Insufficient documentation

## 2013-10-28 DIAGNOSIS — S61209A Unspecified open wound of unspecified finger without damage to nail, initial encounter: Secondary | ICD-10-CM | POA: Insufficient documentation

## 2013-10-28 DIAGNOSIS — Z9861 Coronary angioplasty status: Secondary | ICD-10-CM | POA: Insufficient documentation

## 2013-10-28 DIAGNOSIS — Z85828 Personal history of other malignant neoplasm of skin: Secondary | ICD-10-CM | POA: Insufficient documentation

## 2013-10-28 DIAGNOSIS — R079 Chest pain, unspecified: Secondary | ICD-10-CM | POA: Insufficient documentation

## 2013-10-28 DIAGNOSIS — S61219A Laceration without foreign body of unspecified finger without damage to nail, initial encounter: Secondary | ICD-10-CM

## 2013-10-28 DIAGNOSIS — W261XXA Contact with sword or dagger, initial encounter: Secondary | ICD-10-CM

## 2013-10-28 DIAGNOSIS — Z79899 Other long term (current) drug therapy: Secondary | ICD-10-CM | POA: Insufficient documentation

## 2013-10-28 DIAGNOSIS — I2510 Atherosclerotic heart disease of native coronary artery without angina pectoris: Secondary | ICD-10-CM | POA: Insufficient documentation

## 2013-10-28 DIAGNOSIS — E782 Mixed hyperlipidemia: Secondary | ICD-10-CM | POA: Insufficient documentation

## 2013-10-28 MED ORDER — LIDOCAINE HCL (PF) 1 % IJ SOLN
INTRAMUSCULAR | Status: AC
  Administered 2013-10-28: 12:00:00
  Filled 2013-10-28: qty 5

## 2013-10-28 NOTE — ED Provider Notes (Signed)
CSN: [id number]     Arrival date & time 10/28/13  1101 History   First MD Initiated Contact with Patient 10/28/13 1128     Chief Complaint  Patient presents with  . Laceration     (Consider location/radiation/quality/duration/timing/severity/associated sxs/prior Treatment) Patient is a 58 y.o. male presenting with skin laceration. The history is provided by the patient.  Laceration Location:  Hand Hand laceration location:  L finger Length (cm):  2.3 Depth:  Cutaneous Quality: straight   Bleeding: controlled   Time since incident:  2 hours Laceration mechanism:  Razor Pain details:    Quality:  Throbbing   Severity:  Moderate   Timing:  Constant   Progression:  Unchanged Foreign body present:  No foreign bodies Relieved by:  Nothing Worsened by:  Movement Ineffective treatments:  None tried Tetanus status:  Up to date   Past Medical History  Diagnosis Date  . Essential hypertension, benign   . Mixed hyperlipidemia   . Skin cancer   . Coronary artery disease     a. 03/2013 Cath/PCI: LM nl, LAD 97m (2.5x20 Promus Premier DES), LCX min irregs, RCA dom, 90p(3.0x20 Promus Premier DES), EF 55-65%.  . Tobacco abuse    Past Surgical History  Procedure Laterality Date  . 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 Hand reconstruction Left   . Leg tendon surgery Right    History reviewed. No pertinent family history. History  Substance Use Topics  . Smoking status: Current Every Day Smoker -- 0.50 packs/day for 40 years    Types: Cigarettes  . Smokeless tobacco: Never Used  . Alcohol Use: Yes     Comment: beer q 3 weeks.     Review of Systems  Constitutional: Negative for activity change.       All ROS Neg except as noted in HPI  HENT: Negative for nosebleeds.   Eyes: Negative for photophobia and discharge.  Respiratory: Negative for cough, shortness of breath and wheezing.   Cardiovascular: Positive for chest pain.  Negative for palpitations.  Gastrointestinal: Negative for abdominal pain and blood in stool.  Genitourinary: Negative for dysuria, frequency and hematuria.  Musculoskeletal: Negative for arthralgias, back pain and neck pain.  Skin: Negative.   Neurological: Negative for dizziness, seizures and speech difficulty.  Psychiatric/Behavioral: Negative for hallucinations and confusion.      Allergies  Lisinopril and Neosporin  Home Medications   Prior to Admission medications   Medication Sig Start Date End Date Taking? Authorizing Provider  aspirin EC 81 MG tablet Take 81 mg by mouth daily.   Yes Historical Provider, MD  B Complex-Folic Acid (B COMPLEX-VITAMIN B12) TABS Take 1 tablet by mouth daily.   Yes Historical Provider, MD  clopidogrel (PLAVIX) 75 MG tablet Take 1 tablet (75 mg total) by mouth daily with breakfast. 08/05/13  Yes Arnoldo Lenis, MD  hydrochlorothiazide (HYDRODIURIL) 25 MG tablet Take 25 mg by mouth daily.   Yes Historical Provider, MD  metoprolol tartrate (LOPRESSOR) 25 MG tablet Take 0.5 tablets (12.5 mg total) by mouth 2 (two) times daily. 06/27/13  Yes Arnoldo Lenis, MD  Multiple Vitamin (MULTIVITAMIN) tablet Take 1 tablet by mouth daily.   Yes Historical Provider, MD  nitroGLYCERIN (NITROSTAT) 0.4 MG SL tablet Place 1 tablet (0.4 mg total) under the tongue every 5 (five) minutes as needed for chest pain. 04/12/13  Yes Rogelia Mire, NP  rosuvastatin ([place])  20 MG tablet Take 20 mg by mouth daily.   Yes Historical Provider, MD   BP 166/93  Pulse 88  Temp(Src) 98.2 F (36.8 C) (Oral)  Resp 18  Ht 5\' 9"  (1.753 m)  Wt 185 lb (83.915 kg)  BMI 27.31 kg/m2  SpO2 96% Physical Exam  Nursing note and vitals reviewed. Constitutional: He is oriented to person, place, and time. He appears well-developed and well-nourished.  Non-toxic appearance.  HENT:  Head: Normocephalic.  Right Ear: Tympanic membrane and external ear normal.  Left Ear: Tympanic  membrane and external ear normal.  Eyes: EOM and lids are normal. Pupils are equal, round, and reactive to light.  Neck: Normal range of motion. Neck supple. Carotid bruit is not present.  Cardiovascular: Normal rate, regular rhythm, normal heart sounds, intact distal pulses and normal pulses.   Pulmonary/Chest: Breath sounds normal. No respiratory distress.  Abdominal: Soft. Bowel sounds are normal. There is no tenderness. There is no guarding.  Musculoskeletal: Normal range of motion.       Hands: Lymphadenopathy:       Head (right side): No submandibular adenopathy present.       Head (left side): No submandibular adenopathy present.    He has no cervical adenopathy.  Neurological: He is alert and oriented to person, place, and time. He has normal strength. No cranial nerve deficit or sensory deficit.  Skin: Skin is warm and dry.  Psychiatric: He has a normal mood and affect. His speech is normal.    ED Course  LACERATION REPAIR Date/Time: 10/30/2013 7:49 PM Performed by: Lenox Ahr Authorized by: Lenox Ahr Consent: Verbal consent obtained. Risks and benefits: risks, benefits and alternatives were discussed Consent given by: patient Patient understanding: patient states understanding of the procedure being performed Patient identity confirmed: arm band Time out: Immediately prior to procedure a "time out" was called to verify the correct patient, procedure, equipment, support staff and site/side marked as required. Body area: upper extremity Location details: left index finger Laceration length: 2.3 cm Foreign bodies: no foreign bodies Tendon involvement: none Nerve involvement: none Vascular damage: no Anesthesia: digital block Local anesthetic: lidocaine 2% without epinephrine Patient sedated: no Preparation: Patient was prepped and draped in the usual sterile fashion. Irrigation solution: saline Amount of cleaning: standard Debridement: none Degree of  undermining: none Skin closure: 4-0 nylon Number of sutures: 4 Technique: simple Approximation: close Approximation difficulty: simple Dressing: gauze roll Patient tolerance: Patient tolerated the procedure well with no immediate complications.   (including critical care time) Labs Review Labs Reviewed - No data to display  Imaging Review No results found.   EKG Interpretation None      MDM Pt sustained a laceration of the left index finger. Wound repaired without problem. No neurovascular compromise. Bleeding well controlled.  Pt will return if any excessive bleeding (on plavix), signs of infection or any problem.   Final diagnoses:  None    *I have reviewed nursing notes, vital signs, and all appropriate lab and imaging results for this patient.Lenox Ahr, PA-C 10/30/13 [phone]

## 2013-10-28 NOTE — ED Notes (Signed)
1/2 " lac to LIF  Radial side of pip joint.  Pt concerned since he is taking plavix

## 2013-10-28 NOTE — ED Notes (Signed)
Lac to LIF with carpet knife

## 2013-10-28 NOTE — Discharge Instructions (Signed)
Please keep wound clean and dry. Have sutures removed in 7 days. Stitches, Staples, or Skin Adhesive Strips  Stitches (sutures), staples, and skin adhesive strips hold the skin together as it heals. They will usually be in place for 7 days or less. HOME CARE  Wash your hands with soap and water before and after you touch your wound.  Only take medicine as told by your doctor.  Cover your wound only if your doctor told you to. Otherwise, leave it open to air.  Do not get your stitches wet or dirty. If they get dirty, dab them gently with a clean washcloth. Wet the washcloth with soapy water. Do not rub. Pat them dry gently.  Do not put medicine or medicated cream on your stitches unless your doctor told you to.  Do not take out your own stitches or staples. Skin adhesive strips will fall off by themselves.  Do not pick at the wound. Picking can cause an infection.  Do not miss your follow-up appointment.  If you have problems or questions, call your doctor. GET HELP RIGHT AWAY IF:   You have a temperature by mouth above 102 F (38.9 C), not controlled by medicine.  You have chills.  You have redness or pain around your stitches.  There is puffiness (swelling) around your stitches.  You notice fluid (drainage) from your stitches.  There is a bad smell coming from your wound. MAKE SURE YOU:  Understand these instructions.  Will watch your condition.  Will get help if you are not doing well or get worse. Document Released: 04/27/2009 Document Revised: 09/22/2011 Document Reviewed: 04/27/2009 [facility] Patient Information 2014 [place], [place].

## 2013-10-31 NOTE — ED Provider Notes (Signed)
Medical screening examination/treatment/procedure(s) were performed by non-physician practitioner and as supervising physician I was immediately available for consultation/collaboration.   EKG Interpretation None        Alfonzo Feller, DO 10/31/13 [phone]

## 2013-11-18 ENCOUNTER — Encounter: Payer: Self-pay | Admitting: Cardiology

## 2013-11-18 ENCOUNTER — Ambulatory Visit (INDEPENDENT_AMBULATORY_CARE_PROVIDER_SITE_OTHER): Payer: No Typology Code available for payment source | Admitting: Cardiology

## 2013-11-18 VITALS — BP 170/88 | HR 86 | Ht 69.0 in | Wt 179.0 lb

## 2013-11-18 DIAGNOSIS — E782 Mixed hyperlipidemia: Secondary | ICD-10-CM

## 2013-11-18 DIAGNOSIS — R002 Palpitations: Secondary | ICD-10-CM

## 2013-11-18 DIAGNOSIS — I2510 Atherosclerotic heart disease of native coronary artery without angina pectoris: Secondary | ICD-10-CM

## 2013-11-18 DIAGNOSIS — I10 Essential (primary) hypertension: Secondary | ICD-10-CM

## 2013-11-18 DIAGNOSIS — F17200 Nicotine dependence, unspecified, uncomplicated: Secondary | ICD-10-CM

## 2013-11-18 NOTE — Patient Instructions (Signed)
Your physician wants you to follow-up in: 5 months You will receive a reminder letter in the mail two months in advance. If you don't receive a letter, please call our office to schedule the follow-up appointment.   Your physician recommends that you continue on your current medications as directed. Please refer to the Current Medication list given to you today.    Thank you for choosing [facility] !   

## 2013-11-18 NOTE — Progress Notes (Signed)
Clinical Summary Duane Adams is a 58 y.o.male seen today for follow up of the following medical problems.   1. CAD  - recent cath 04/11/13 showed significant LAD and RCA disease, s/p DES to both  - echo 03/2013 with normal LVEF  - compliant w/ meds including ASA and plavix  - describes some feelings of generalized fatigue that has gotten better with decreasing his metoprolol dose. Hx of angioedema on lisinopril    - energy level is increasing after decreasing metoprolol, walking a mile daily  2. HL  - Recent panel 09/2013 TC 158 HDL 45 TG 255 LDL 62 - on crestor 57m daily, compliant  3. HTN  - compliant with medications, however forgot to take prior to coming - checks at home, typically 140/80s  4. Tobacco  - down to 1/2 pack  - prevoiusly was using e cigarette but describes sore throat with use. , has nicotine gum which helps.   5. Palpitations  - wore monitor recently without any signifcant arrhythmias. - has decreased his caffeine intake with resolution of symptoms  Past Medical History  Diagnosis Date  . Essential hypertension, benign   . Mixed hyperlipidemia   . Skin cancer   . Coronary artery disease     a. 03/2013 Cath/PCI: LM nl, LAD 853m2.5x20 Promus Premier DES), LCX min irregs, RCA dom, 90p(3.0x20 Promus Premier DES), EF 55-65%.  . Tobacco abuse      Allergies  Allergen Reactions  . Lisinopril Swelling    Dizziness, chest pain   . Neosporin [Neomycin-Bacitracin Zn-Polymyx]      Current Outpatient Prescriptions  Medication Sig Dispense Refill  . aspirin EC 81 MG tablet Take 81 mg by mouth daily.      . B Complex-Folic Acid (B COMPLEX-VITAMIN B12) TABS Take 1 tablet by mouth daily.      . clopidogrel (PLAVIX) 75 MG tablet Take 1 tablet (75 mg total) by mouth daily with breakfast.  30 tablet  6  . hydrochlorothiazide (HYDRODIURIL) 25 MG tablet Take 25 mg by mouth daily.      . metoprolol tartrate (LOPRESSOR) 25 MG tablet Take 0.5 tablets (12.5 mg  total) by mouth 2 (two) times daily.  90 tablet  3  . Multiple Vitamin (MULTIVITAMIN) tablet Take 1 tablet by mouth daily.      . nitroGLYCERIN (NITROSTAT) 0.4 MG SL tablet Place 1 tablet (0.4 mg total) under the tongue every 5 (five) minutes as needed for chest pain.  25 tablet  3  . rosuvastatin (CRESTOR) 20 MG tablet Take 20 mg by mouth daily.       No current facility-administered medications for this visit.     Past Surgical History  Procedure Laterality Date  . 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 Hand reconstruction Left   . Leg tendon surgery Right      Allergies  Allergen Reactions  . Lisinopril Swelling    Dizziness, chest pain   . Neosporin [Neomycin-Bacitracin Zn-Polymyx]       No family history on file.   Social History Duane Adams that he has been smoking Cigarettes.  He has a 20 pack-year smoking history. He has never used smokeless tobacco. Duane Adams that he drinks alcohol.   Review of Systems CONSTITUTIONAL: No weight loss, fever, chills, weakness or fatigue.  HEENT: Eyes: No visual loss, blurred vision, double vision or yellow sclerae.No  hearing loss, sneezing, congestion, runny nose or sore throat.  SKIN: No rash or itching.  CARDIOVASCULAR:  RESPIRATORY: No shortness of breath, cough or sputum.  GASTROINTESTINAL: No anorexia, nausea, vomiting or diarrhea. No abdominal pain or blood.  GENITOURINARY: No burning on urination, no polyuria NEUROLOGICAL: No headache, dizziness, syncope, paralysis, ataxia, numbness or tingling in the extremities. No change in bowel or bladder control.  MUSCULOSKELETAL: No muscle, back pain, joint pain or stiffness.  LYMPHATICS: No enlarged nodes. No history of splenectomy.  PSYCHIATRIC: No history of depression or anxiety.  ENDOCRINOLOGIC: No reports of sweating, cold or heat intolerance. No polyuria or polydipsia.  Marland Kitchen   Physical Examination p 86 bp  170/88 Wt 179 lbs BMI 26 Gen: resting comfortably, no acute distress HEENT: no scleral icterus, pupils equal round and reactive, no palptable cervical adenopathy,  CV: RRR, no m/r/g, no JVD, no carotid bruits Resp: Clear to auscultation bilaterally GI: abdomen is soft, non-tender, non-distended, normal bowel sounds, no hepatosplenomegaly MSK: extremities are warm, no edema.  Skin: warm, no rash Neuro:  no focal deficits Psych: appropriate affect   Diagnostic Studies  04/11/13 Cath  PROCEDURAL FINDINGS  Hemodynamics:  AO 178/93  LV 177/18  Coronary angiography:  Coronary dominance: right  Left mainstem: Widely patent without obstructive disease  Left anterior descending (LAD): severe mid-vessel stenosis (80%) between the first and second septal perforators. The vessel reaches the LV apex and there is no other significant disease noted. Large D1 is patent.  Left circumflex (LCx): small vessel, supplies 2 OM branches. Mild irregularity noted.  Right coronary artery (RCA): large, dominant vessel. There is severe 90% proximal stenosis. Irregularity in the mid-vessel without significant stenosis. The PDA and PLA branches are both large without significant disease.  Left ventriculography: Left ventricular systolic function is normal, LVEF is estimated at 55-65%, there is no significant mitral regurgitation  PCI Note: Following the diagnostic procedure, the decision was made to proceed with PCI. The patient was loaded with plavix 600 mg. Weight-based bivalirudin was given for anticoagulation. I planned on treating the LAD and RCA, both of which have high-grade disease. Once a therapeutic ACT was achieved, a 6 [place] XB-LAD guide catheter was inserted. A cougar coronary guidewire was used to cross the lesion. The lesion was predilated with a 2.0 mm balloon. The lesion was then stented with a 2.5x20 mm Promus Premier drug-eluting stent. The stent was postdilated with a 2.75 mm noncompliant balloon.  Following PCI, there was 0% residual stenosis and TIMI-3 flow. Attention was then turned to the RCA. A JR-4 guide was used. The same cougar guidewire was used to cross the lesion. The lesion was dilated with a 2.0 mm balloon and stented with a 3.0x20 mm Promus Premier DES. The stent was post-dilated to 18 atm with a 3.25 mm [place] balloon. Final angiography confirmed an excellent result. The patient tolerated the procedure well. There were no immediate procedural complications. A TR band was used for radial hemostasis. The patient was transferred to the post catheterization recovery area for further monitoring.  PCI Data:  Lesion 1  Vessel - LAD/Segment - mid  Percent Stenosis (pre) 80  TIMI-flow 3  Stent 2.5x20 mm Promus Premier DES  Percent Stenosis (post) 0  TIMI-flow (post) 3  Lesion 2  Vessel - RCA/Segment - prox  Percent Stenosis (pre) 90  TIMI-flow 3  Stent 3.0x20 mm Promus Premier DES  Percent Stenosis (post) 0  TIMI-flow (post) 3  Final Conclusions:  Severe 2 vessel CAD with  successful PCI of the LAD and RCA  Normal LV function  Recommendations:  ASA and plavix for at least 12 months.   04/04/13 Echo  LVEF 60-65%, no WMA, grade I diastolic dysfunction       Assessment and Plan   1. CAD  - s/p DES to LAD and DES to RCA in setting of angina  - continue ASA indefinitely, plavix at least until 04/2014  - continue risk factor modification  - no current symptoms  2. HTN  - elevated in clinic, home numbers are at goal   3. HL  - LDL at goal, counseled on dietery changes to improve TGs   4. Palpitations  - resolved symptoms  - no significant arrythmias on recent monitor, continue to follow clinically   5. Tobacco  - down to 1/2 pack, using nicotine gum prn.  - counseled on health benefits of cessation   F/u 5 months    Arnoldo Lenis, M.D., F.A.C.C.

## 2014-01-23 ENCOUNTER — Telehealth: Payer: Self-pay

## 2014-01-23 NOTE — Telephone Encounter (Signed)
LMTCB

## 2014-01-23 NOTE — Telephone Encounter (Signed)
[facility] cannot [facility] to take crestor regularly. He wonders if you can prescribe something cheaper.

## 2014-01-23 NOTE — Telephone Encounter (Signed)
Message copied by Bernita Raisin on Mon Jan 23, 2014  2:44 PM ------      Message from: Easton F      Created: Mon Jan 23, 2014  2:22 PM       Received recent labs from 01/08/14 ordered by [place]. LDL is 83 which is too high for him. His goal LDL<70. Please confirm compliance with the dose we have documented of crestor 20mg  daily. If taking, please increase to 40mg  daily.                   Zandra Abts MD ------

## 2014-01-24 ENCOUNTER — Encounter: Payer: Self-pay | Admitting: Cardiology

## 2014-01-26 NOTE — Telephone Encounter (Signed)
Will forward pts concern to Dr.Branch

## 2014-01-26 NOTE — Telephone Encounter (Signed)
Is he able to get atorvastation 80mg  daily from there?   Zandra Abts MD

## 2014-01-26 NOTE — Telephone Encounter (Signed)
LM for [facility] at [facility] to cal back to discuss

## 2014-01-31 NOTE — Telephone Encounter (Signed)
Kim, LPN ,from [facility] called today,states they were seeing pt today and she will call me back with dispo regarding pt's ability to get atorvastatin from them

## 2014-03-30 ENCOUNTER — Telehealth: Payer: Self-pay | Admitting: *Deleted

## 2014-03-30 DIAGNOSIS — E785 Hyperlipidemia, unspecified: Secondary | ICD-10-CM

## 2014-03-30 NOTE — Telephone Encounter (Signed)
Received labs: AST/SGOT 106   ALT/SGPT    179  TRIGLYCERIDES 189  Will forward to Dr. Harl Bowie and in folder

## 2014-03-31 NOTE — Addendum Note (Signed)
Addended by: Barbarann Ehlers A on: 03/31/2014 01:07 PM   Modules accepted: Orders

## 2014-03-31 NOTE — Telephone Encounter (Signed)
Will forward to Dr. Branch. 

## 2014-03-31 NOTE — Telephone Encounter (Signed)
Liver tests are slightly elevated, it may have to do with his cholesterol medicine. We had been trying to get him on lipitor 80mg  daily, unclear if he started it or note. Please have him stop his statin for now, and repeat CMET in 3-4 weeks.  Zandra Abts MD

## 2014-03-31 NOTE — Telephone Encounter (Signed)
Pt on Crestor 20 mg, never on lipitor. Will hold crestor and repeat labs in 3 weeks

## 2014-04-05 ENCOUNTER — Encounter: Payer: Self-pay | Admitting: Cardiology

## 2014-04-20 ENCOUNTER — Telehealth: Payer: Self-pay | Admitting: *Deleted

## 2014-04-20 NOTE — Telephone Encounter (Signed)
   Received a call from [facility] ([place] at [facility]. She is following up from a previous visit where you D/c Crestor due to Liver Function lab results. She called to make sure you aware that this patient has Hepatitis and that could be the cause of his results.  States that if you had any questions that you could giver her a call at [phone]

## 2014-04-21 ENCOUNTER — Telehealth: Payer: Self-pay

## 2014-04-21 LAB — HEPATIC FUNCTION PANEL
ALK PHOS: 82 U/L (ref 39–117)
ALT: 109 U/L — AB (ref 0–53)
AST: 94 U/L — ABNORMAL HIGH (ref 0–37)
Albumin: 4.2 g/dL (ref 3.5–5.2)
BILIRUBIN INDIRECT: 0.5 mg/dL (ref 0.2–1.2)
Bilirubin, Direct: 0.1 mg/dL (ref 0.0–0.3)
TOTAL PROTEIN: 7 g/dL (ref 6.0–8.3)
Total Bilirubin: 0.6 mg/dL (ref 0.2–1.2)

## 2014-04-21 NOTE — Telephone Encounter (Signed)
Message copied by Bernita Raisin on Fri Apr 21, 2014  1:13 PM ------      Message from: Orion Modest      Created: Fri Apr 21, 2014 12:50 PM                   ----- Message -----         From: Arnoldo Lenis, MD         Sent: 04/21/2014  12:29 PM           To: Orion Modest, CMA            Liver tests remain mildly elevated. From his pcp they report a history of hepatitis, perhaps it is related to that. Would continue to stay off cholesterol medicine at this time.            J branch MD ------

## 2014-04-21 NOTE — Telephone Encounter (Signed)
Spoke with pt, message relayed.

## 2014-06-22 ENCOUNTER — Encounter (HOSPITAL_COMMUNITY): Payer: Self-pay | Admitting: Cardiovascular Disease

## 2014-07-12 ENCOUNTER — Telehealth: Payer: Self-pay | Admitting: *Deleted

## 2014-07-12 NOTE — Telephone Encounter (Signed)
Received labs, in Dr. Nelly Laurence folder. Reviewed by [facility] doctor with notes

## 2014-07-24 ENCOUNTER — Encounter: Payer: Self-pay | Admitting: Cardiology

## 2014-10-09 NOTE — Progress Notes (Signed)
Cardiology Office Note   Date:  10/10/2014   ID:  Duane Adams, DOB 1956/05/03, MRN [id number]  PCP:  Montey Hora  Cardiologist:  Cloria Spring, NP   Chief Complaint  Patient presents with  . Coronary Artery Disease  . Hyperlipidemia  . Hypertension      History of Present Illness: Duane Adams is a 59 y.o. male who presents for ongoing assessment and management of CAD,most recent cardiac catheterization September 2014 setting significant LAD and RCA with Drug-eluting stent was placed to both coronariy arteries;.hypertension, hyperlipidemia, and ongoing tobacco abuse.  He was last seen by Dr. Harl Bowie in May of 2015.  At that time he was stable.  He is here for followup.  He comes today feeling worse than he did prior to the stents. He says that his energy level is worse. He cannot walk 50 ft before feeling tired. He continues to smoke and states he is down to 1/2 ppd. He is medically complaint. He has recently gotten his Medicaid card and is now able to afford his medications. He is being seen at the [facility]. He is intolerant to statin. He also has been diagnosed with Hepatitis C.  He states he feels pain on the left side of his chest all the time, but sometimes it feels worse and he takes a NTG for this which helps.    Past Medical History  Diagnosis Date  . Essential hypertension, benign   . Mixed hyperlipidemia   . Skin cancer   . Coronary artery disease     a. 03/2013 Cath/PCI: LM nl, LAD 34m (2.5x20 Promus Premier DES), LCX min irregs, RCA dom, 90p(3.0x20 Promus Premier DES), EF 55-65%.  . Tobacco abuse     Past Surgical History  Procedure Laterality Date  . 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 Hand reconstruction Left   . Leg tendon surgery Right   . Left heart catheterization with coronary angiogram N/A 04/11/2013    Procedure: LEFT HEART CATHETERIZATION WITH CORONARY ANGIOGRAM;  Surgeon:  Blane Ohara, MD;  Location: [facility] CATH LAB;  Service: Cardiovascular;  Laterality: N/A;     Current Outpatient Prescriptions  Medication Sig Dispense Refill  . aspirin EC 81 MG tablet Take 81 mg by mouth daily.    . cloNIDine (CATAPRES - DOSED IN MG/24 HR) 0.1 mg/24hr patch Place 0.1 mg onto the skin once a week.    . clopidogrel (PLAVIX) 75 MG tablet Take 1 tablet (75 mg total) by mouth daily with breakfast. 30 tablet 6  . hydrochlorothiazide (HYDRODIURIL) 25 MG tablet Take 25 mg by mouth daily.    . Multiple Vitamin (MULTIVITAMIN) tablet Take 1 tablet by mouth daily.    . nitroGLYCERIN (NITROSTAT) 0.4 MG SL tablet Place 1 tablet (0.4 mg total) under the tongue every 5 (five) minutes as needed for chest pain. 25 tablet 3  . Omega-3 Fatty Acids (FISH OIL) 1000 MG CAPS Take 2,400 mg by mouth.    . B Complex-Folic Acid (B COMPLEX-VITAMIN B12) TABS Take 1 tablet by mouth daily.    . metoprolol succinate (TOPROL XL) 25 MG 24 hr tablet Take 1 tablet (25 mg total) by mouth daily. Please take at night 90 tablet 3   No current facility-administered medications for this visit.    Allergies:   Lisinopril and Neosporin    Social History:  The patient  reports  that he has been smoking Cigarettes.  He started smoking about 43 years ago. He has a 20 pack-year smoking history. He has never used smokeless tobacco. He reports that he drinks alcohol. He reports that he does not use illicit drugs.   Family History:  The patient's family history is not on file.    ROS: .   All other systems are reviewed and negative.Unless otherwise mentioned in H&P above.   PHYSICAL EXAM: VS:  BP 124/70 mmHg  Pulse 90  Ht 5\' 8"  (1.727 m)  Wt 184 lb (83.462 kg)  BMI 27.98 kg/m2  SpO2 95% , BMI Body mass index is 27.98 kg/(m^2). GEN: Well nourished, well developed, in no acute distress HEENT: normal Neck: no JVD, carotid bruits, or masses Cardiac: RRR; no murmurs, rubs, or gallops,no edema  Respiratory:  clear  to auscultation bilaterally, normal work of breathing GI: soft, nontender, nondistended, + BS MS: no deformity or atrophy Skin: warm and dry, no rash Neuro:  Strength and sensation are intact Psych: euthymic mood, full affect   EKG:The ekg ordered today demonstrates NSR rate of 93 bpm   Recent Labs: 04/20/2014: ALT 109*    Lipid Panel    Component Value Date/Time   CHOL 154 08/05/2013 1005   TRIG 206* 08/05/2013 1005   HDL 48 08/05/2013 1005   CHOLHDL 3.2 08/05/2013 1005   VLDL 41* 08/05/2013 1005   LDLCALC 65 08/05/2013 1005      Wt Readings from Last 3 Encounters:  10/10/14 184 lb (83.462 kg)  11/18/13 179 lb (81.194 kg)  10/28/13 185 lb (83.915 kg)      Other studies Reviewed: Additional studies/ records that were reviewed today include: prior cath report and DES placement. Review of the above records demonstrates: DES to RCA and LAD   ASSESSMENT AND PLAN:  1. CAD:  States he feels worse over the last 8-9 months after having stent placment. He states he is more short of breath and having fatigue. I reviewed his medications with him. He is taking metoprolol tartrate 25 mg daily instead of 12.5 mg BID as directed. He states he does that as he cannot remember to take his medications twice a day. I will change his metoprolol to succinate, so that he can have a steady state instead of dose in am that may be too strong for him causing symptoms. He will take it at night to avoid day time fatigue.   For recurrent symptoms of chest pain, although I think it is musculoskeletal, I will repeat his stress test as he continues to smoke, drink and is intolerant to statins. He will continue on DAPT with ASA and Plavix.   2. DOE: Due to hx of smoking and worsening symptoms, I will have him do PFT's to evaluate lung function.   3. Hypertension: Currently well controlled. No changes in medication at this time. No documentation of why he is not on ACE. May need to address this on next  visit..   Current medicines are reviewed at length with the patient today. Change his metoprolol tartrate to succinate.   Labs/ tests ordered : PFTS and NM stress test. Orders Placed This Encounter  Procedures  . NM Myocar Multi W/Spect W/Wall Motion / EF  . Myocardial Perfusion Imaging  . Pulmonary function test  . Electrocardiogram report     Disposition:   FU with post testing for discussion of results.   Signed, Jory Sims, NP  10/10/2014 2:55 PM    [facility] [address] [place], [place] [address] Phone: [phone]; Fax: [phone]

## 2014-10-10 ENCOUNTER — Encounter: Payer: Self-pay | Admitting: *Deleted

## 2014-10-10 ENCOUNTER — Telehealth: Payer: Self-pay | Admitting: Adult Health

## 2014-10-10 ENCOUNTER — Encounter: Payer: Self-pay | Admitting: Adult Health

## 2014-10-10 ENCOUNTER — Ambulatory Visit (INDEPENDENT_AMBULATORY_CARE_PROVIDER_SITE_OTHER): Payer: No Typology Code available for payment source | Admitting: Adult Health

## 2014-10-10 VITALS — BP 124/70 | HR 90 | Ht 68.0 in | Wt 184.0 lb

## 2014-10-10 DIAGNOSIS — I10 Essential (primary) hypertension: Secondary | ICD-10-CM

## 2014-10-10 DIAGNOSIS — R072 Precordial pain: Secondary | ICD-10-CM

## 2014-10-10 DIAGNOSIS — R0609 Other forms of dyspnea: Secondary | ICD-10-CM

## 2014-10-10 MED ORDER — METOPROLOL SUCCINATE ER 25 MG PO TB24: 25.0000 mg | ORAL_TABLET | Freq: Every day | ORAL | Status: DC

## 2014-10-10 NOTE — Progress Notes (Deleted)
Name: Duane Adams    DOB: 1956-06-06  Age: 59 y.o.  MR#: [id number]       PCP:  Jacqualine Mau, PA-C      Insurance: Payor: [employer] DISCOUNT / Plan: [employer] DISCOUNT 100% / Product Type: *No Product type* /   CC:    Chief Complaint  Patient presents with  . Coronary Artery Disease  . Hyperlipidemia  . Hypertension    VS Filed Vitals:   10/10/14 1405  BP: 124/70  Pulse: 90  Height: '5\' 8"'  (1.727 m)  Weight: 184 lb (83.462 kg)  SpO2: 95%    Weights Current Weight  10/10/14 184 lb (83.462 kg)  11/18/13 179 lb (81.194 kg)  10/28/13 185 lb (83.915 kg)    Blood Pressure  BP Readings from Last 3 Encounters:  10/10/14 124/70  11/18/13 170/88  10/28/13 166/93     Admit date:  (Not on file) Last encounter with RMR:  Visit date not found   Allergy Lisinopril and Neosporin  Current Outpatient Prescriptions  Medication Sig Dispense Refill  . aspirin EC 81 MG tablet Take 81 mg by mouth daily.    . cloNIDine (CATAPRES - DOSED IN MG/24 HR) 0.1 mg/24hr patch Place 0.1 mg onto the skin once a week.    . clopidogrel (PLAVIX) 75 MG tablet Take 1 tablet (75 mg total) by mouth daily with breakfast. 30 tablet 6  . hydrochlorothiazide (HYDRODIURIL) 25 MG tablet Take 25 mg by mouth daily.    . metoprolol tartrate (LOPRESSOR) 25 MG tablet Take 0.5 tablets (12.5 mg total) by mouth 2 (two) times daily. 90 tablet 3  . Multiple Vitamin (MULTIVITAMIN) tablet Take 1 tablet by mouth daily.    . nitroGLYCERIN (NITROSTAT) 0.4 MG SL tablet Place 1 tablet (0.4 mg total) under the tongue every 5 (five) minutes as needed for chest pain. 25 tablet 3  . Omega-3 Fatty Acids (FISH OIL) 1000 MG CAPS Take 2,400 mg by mouth.    . B Complex-Folic Acid (B COMPLEX-VITAMIN B12) TABS Take 1 tablet by mouth daily.     No current facility-administered medications for this visit.    Discontinued Meds:   There are no discontinued medications.  Patient Active Problem List   Diagnosis Date Noted  . Unstable angina  04/12/2013  . Essential hypertension 04/12/2013  . Coronary artery disease   . Mixed hyperlipidemia   . Tobacco abuse   . Chest pain 03/16/2013  . Unspecified malignant neoplasm of skin of lower limb, including hip 03/16/2013  . Tobacco use disorder 03/16/2013  . Other and unspecified hyperlipidemia 03/16/2013  . Unspecified essential hypertension 03/16/2013    LABS    Component Value Date/Time   NA 138 04/12/2013 0611   NA 139 03/31/2013 1145   NA 135 10/30/2011 1148   K 3.5 04/12/2013 0611   K 4.9 03/31/2013 1145   K 4.2 10/30/2011 1148   CL 101 04/12/2013 0611   CL 105 03/31/2013 1145   CL 100 10/30/2011 1148   CO2 25 04/12/2013 0611   CO2 27 03/31/2013 1145   CO2 27 10/30/2011 1148   GLUCOSE 95 04/12/2013 0611   GLUCOSE 94 03/31/2013 1145   GLUCOSE 104* 10/30/2011 1148   BUN 10 04/12/2013 0611   BUN 11 03/31/2013 1145   BUN 7 10/30/2011 1148   CREATININE 0.79 04/12/2013 0611   CREATININE 0.76 03/31/2013 1145   CREATININE 0.71 10/30/2011 1148   CREATININE 0.91 09/26/2009 1711   CALCIUM 9.4 04/12/2013 4650  CALCIUM 9.6 03/31/2013 1145   CALCIUM 9.4 10/30/2011 1148   GFRNONAA >90 04/12/2013 0611   GFRNONAA >89 03/31/2013 1145   GFRNONAA >90 10/30/2011 1148   GFRNONAA >60 09/26/2009 1711   GFRAA >90 04/12/2013 0611   GFRAA >89 03/31/2013 1145   GFRAA >90 10/30/2011 1148   GFRAA  09/26/2009 1711    >60        The eGFR has been calculated using the MDRD equation. This calculation has not been validated in all clinical situations. eGFR's persistently <60 mL/min signify possible Chronic Kidney Disease.   CMP     Component Value Date/Time   NA 138 04/12/2013 0611   K 3.5 04/12/2013 0611   CL 101 04/12/2013 0611   CO2 25 04/12/2013 0611   GLUCOSE 95 04/12/2013 0611   BUN 10 04/12/2013 0611   CREATININE 0.79 04/12/2013 0611   CREATININE 0.76 03/31/2013 1145   CALCIUM 9.4 04/12/2013 0611   PROT 7.0 04/20/2014 1137   ALBUMIN 4.2 04/20/2014 1137   AST  94* 04/20/2014 1137   ALT 109* 04/20/2014 1137   ALKPHOS 82 04/20/2014 1137   BILITOT 0.6 04/20/2014 1137   GFRNONAA >90 04/12/2013 0611   GFRNONAA >89 03/31/2013 1145   GFRAA >90 04/12/2013 0611   GFRAA >89 03/31/2013 1145       Component Value Date/Time   WBC 12.2* 04/12/2013 0611   WBC 8.1 03/31/2013 1030   WBC 7.4 10/30/2011 1148   HGB 17.0 04/12/2013 0611   HGB 16.5 03/31/2013 1030   HGB 15.4 10/30/2011 1148   HCT 48.0 04/12/2013 0611   HCT 46.5 03/31/2013 1030   HCT 45.6 10/30/2011 1148   MCV 90.6 04/12/2013 0611   MCV 89.8 03/31/2013 1030   MCV 93.3 10/30/2011 1148    Lipid Panel     Component Value Date/Time   CHOL 154 08/05/2013 1005   TRIG 206* 08/05/2013 1005   HDL 48 08/05/2013 1005   CHOLHDL 3.2 08/05/2013 1005   VLDL 41* 08/05/2013 1005   LDLCALC 65 08/05/2013 1005    ABG No results found for: PHART, PCO2ART, PO2ART, HCO3, TCO2, ACIDBASEDEF, O2SAT   No results found for: TSH BNP (last 3 results) No results for input(s): BNP in the last 8760 hours.  ProBNP (last 3 results) No results for input(s): PROBNP in the last 8760 hours.  Cardiac Panel (last 3 results) No results for input(s): CKTOTAL, CKMB, TROPONINI, RELINDX in the last 72 hours.  Iron/TIBC/Ferritin/ %Sat No results found for: IRON, TIBC, FERRITIN, IRONPCTSAT   EKG Orders placed or performed in visit on 07/19/13  . Cardiac event monitor     Prior Assessment and Plan Problem List as of 10/10/2014      Cardiovascular and Mediastinum   Unspecified essential hypertension   Coronary artery disease   Unstable angina   Essential hypertension     Musculoskeletal and Integument   Unspecified malignant neoplasm of skin of lower limb, including hip     Other   Chest pain   Tobacco use disorder   Other and unspecified hyperlipidemia   Mixed hyperlipidemia   Tobacco abuse       Imaging: No results found.

## 2014-10-10 NOTE — Telephone Encounter (Signed)
Gave patients instructions

## 2014-10-10 NOTE — Patient Instructions (Addendum)
Your physician recommends that you schedule a follow-up appointment in: After Stress Test   Your physician recommends that you continue on your current medications as directed. Please refer to the Current Medication list given to you today.  Your physician has recommended that you have a pulmonary function test. Pulmonary Function Tests are a group of tests that measure how well air moves in and out of your lungs.  Your physician has requested that you have en exercise stress myoview. For further information please visit [email or web address]. Please follow instruction sheet, as given.  Thank you for choosing [facility]!  \

## 2014-10-10 NOTE — Telephone Encounter (Signed)
°  Friday, 4/1 - arrive at 6:15 for 8am appointment; check in at main desk at [facility]; NPO - patient was given printout of instructions for both of these tests  Wednesday, 4/6 - arrive at 7:45 for an 8am appointment; check in at main desk at [facility]; no smoking, no breathing meds, no caffeine for 4 hours prior

## 2014-10-10 NOTE — Addendum Note (Signed)
Addended by: Barbarann Ehlers A on: 10/10/2014 03:41 PM   Modules accepted: Orders

## 2014-10-13 ENCOUNTER — Encounter (HOSPITAL_COMMUNITY)
Admission: RE | Admit: 2014-10-13 | Discharge: 2014-10-13 | Disposition: A | Discharge status: Home / Self Care | Payer: Medicaid Other | Source: Ambulatory Visit | Attending: Adult Health | Admitting: Adult Health

## 2014-10-13 ENCOUNTER — Ambulatory Visit (HOSPITAL_COMMUNITY)
Admission: RE | Admit: 2014-10-13 | Discharge: 2014-10-13 | Disposition: A | Discharge status: Home / Self Care | Payer: Medicaid Other | Source: Ambulatory Visit | Attending: Adult Health | Admitting: Adult Health

## 2014-10-13 ENCOUNTER — Encounter (HOSPITAL_COMMUNITY): Payer: Self-pay

## 2014-10-13 DIAGNOSIS — R0609 Other forms of dyspnea: Secondary | ICD-10-CM | POA: Diagnosis not present

## 2014-10-13 DIAGNOSIS — I2510 Atherosclerotic heart disease of native coronary artery without angina pectoris: Secondary | ICD-10-CM | POA: Insufficient documentation

## 2014-10-13 DIAGNOSIS — R072 Precordial pain: Secondary | ICD-10-CM | POA: Diagnosis not present

## 2014-10-13 DIAGNOSIS — R079 Chest pain, unspecified: Secondary | ICD-10-CM | POA: Diagnosis not present

## 2014-10-13 MED ORDER — SODIUM CHLORIDE 0.9 % IJ SOLN
INTRAMUSCULAR | Status: AC
  Administered 2014-10-13: 10 mL via INTRAVENOUS
  Filled 2014-10-13: qty 3

## 2014-10-13 MED ORDER — SODIUM CHLORIDE 0.9 % IJ SOLN
10.0000 mL | INTRAMUSCULAR | Status: DC | PRN
  Administered 2014-10-13: 10 mL via INTRAVENOUS
  Filled 2014-10-13: qty 10

## 2014-10-13 MED ORDER — REGADENOSON 0.4 MG/5ML IV SOLN
INTRAVENOUS | Status: AC
  Filled 2014-10-13: qty 5

## 2014-10-13 MED ORDER — TECHNETIUM TC 99M SESTAMIBI GENERIC - CARDIOLITE
10.0000 | Freq: Once | INTRAVENOUS | Status: AC | PRN
  Administered 2014-10-13: 10 via INTRAVENOUS

## 2014-10-13 MED ORDER — TECHNETIUM TC 99M SESTAMIBI - CARDIOLITE
30.0000 | Freq: Once | INTRAVENOUS | Status: AC | PRN
Start: 2014-10-13 — End: 2014-10-13
  Administered 2014-10-13: 09:00:00 30 via INTRAVENOUS

## 2014-10-13 NOTE — Progress Notes (Signed)
Stress Lab Nurses Notes - [name]  Duane Adams 10/13/2014 Reason for doing test: CAD and DOE Type of test: Stress Cardiolite Nurse performing test: Gerrit Halls, RN Nuclear Medicine Tech: Redmond Baseman Echo Tech: Not Applicable MD performing test: Branch/K.Purcell Nails NP Family MD: Soyla Dryer PA Test explained and consent signed: Yes.   IV started: Saline lock flushed, No redness or edema and Saline lock started in radiology Symptoms: fatigue in legs & pain in hips Treatment/Intervention: None Reason test stopped: fatigue After recovery IV was: Discontinued via X-ray tech and No redness or edema Patient to return to Nuc. Med at : 9:45 Patient discharged: Home Patient's Condition upon discharge was: stable Comments: During test peak BP 188/77 & HR 151.  Recovery BP 123/74 & HR 126.  Symptoms resolved in recovery.  [name] T

## 2014-10-18 ENCOUNTER — Ambulatory Visit (HOSPITAL_COMMUNITY)
Admission: RE | Admit: 2014-10-18 | Discharge: 2014-10-18 | Disposition: A | Discharge status: Home / Self Care | Payer: Medicaid Other | Source: Ambulatory Visit | Attending: Adult Health | Admitting: Adult Health

## 2014-10-18 ENCOUNTER — Telehealth: Payer: Self-pay | Admitting: *Deleted

## 2014-10-18 DIAGNOSIS — R0609 Other forms of dyspnea: Secondary | ICD-10-CM | POA: Diagnosis not present

## 2014-10-18 DIAGNOSIS — R942 Abnormal results of pulmonary function studies: Secondary | ICD-10-CM

## 2014-10-18 LAB — PULMONARY FUNCTION TEST
DL/VA % pred: 86 %
DL/VA: 3.93 ml/min/mmHg/L
DLCO UNC % PRED: 54 %
DLCO UNC: 16.96 ml/min/mmHg
FEF 25-75 Post: 3.8 L/sec
FEF 25-75 Pre: 3.08 L/sec
FEF2575-%Change-Post: 23 %
FEF2575-%Pred-Post: 126 %
FEF2575-%Pred-Pre: 102 %
FEV1-%CHANGE-POST: 8 %
FEV1-%PRED-PRE: 74 %
FEV1-%Pred-Post: 81 %
FEV1-Post: 2.9 L
FEV1-Pre: 2.66 L
FEV1FVC-%CHANGE-POST: 0 %
FEV1FVC-%Pred-Pre: 110 %
FEV6-%Change-Post: 8 %
FEV6-%PRED-PRE: 70 %
FEV6-%Pred-Post: 77 %
FEV6-PRE: 3.16 L
FEV6-Post: 3.44 L
FEV6FVC-%PRED-POST: 104 %
FEV6FVC-%PRED-PRE: 104 %
FVC-%Change-Post: 8 %
FVC-%Pred-Post: 73 %
FVC-%Pred-Pre: 67 %
FVC-POST: 3.44 L
FVC-PRE: 3.16 L
POST FEV6/FVC RATIO: 100 %
Post FEV1/FVC ratio: 84 %
Pre FEV1/FVC ratio: 84 %
Pre FEV6/FVC Ratio: 100 %
RV % pred: 107 %
RV: 2.31 L
TLC % pred: 77 %
TLC: 5.25 L

## 2014-10-18 MED ORDER — ALBUTEROL SULFATE (2.5 MG/3ML) 0.083% IN NEBU
2.5000 mg | INHALATION_SOLUTION | Freq: Once | RESPIRATORY_TRACT | Status: AC
  Administered 2014-10-18: 2.5 mg via RESPIRATORY_TRACT

## 2014-10-18 NOTE — Telephone Encounter (Signed)
-----   Message from Lendon Colonel, NP sent at 10/18/2014 10:55 AM EDT ----- Please refer patient to Dr. Luan Pulling due to abnormal PFT with lung deficiencies.

## 2014-10-30 ENCOUNTER — Other Ambulatory Visit: Payer: Self-pay

## 2014-10-30 ENCOUNTER — Other Ambulatory Visit: Payer: Self-pay | Admitting: *Deleted

## 2014-10-30 ENCOUNTER — Other Ambulatory Visit: Payer: Self-pay | Admitting: Adult Health

## 2014-10-30 MED ORDER — METOPROLOL SUCCINATE ER 25 MG PO TB24: 25.0000 mg | ORAL_TABLET | Freq: Every day | ORAL | Status: DC

## 2014-10-30 MED ORDER — METOPROLOL TARTRATE 25 MG PO TABS
25.0000 mg | ORAL_TABLET | Freq: Two times a day (BID) | ORAL | Status: DC
Start: 2014-10-30 — End: 2015-01-11

## 2014-10-30 NOTE — Telephone Encounter (Signed)
Patient said when he called [facility] they're asking for an 8-digit number that he doesn't have.

## 2014-10-30 NOTE — Addendum Note (Signed)
Addended by: Levonne Hubert on: 10/30/2014 04:48 PM   Modules accepted: Orders

## 2014-10-30 NOTE — Telephone Encounter (Signed)
Spoke with [facility]. They state that they do not supply Metoprolol Succinate. And this is the reason why the patient has not received this med at this time. [facility] states they only suppy have Metoprolol Tartrate. Please advise

## 2014-10-30 NOTE — Telephone Encounter (Signed)
Patient states he would like Metoprolol Succinate call into [facility]

## 2014-10-30 NOTE — Telephone Encounter (Signed)
Cancelled mail order metoprolol at pt request

## 2014-10-30 NOTE — Telephone Encounter (Signed)
Rx for metroprolol tartrate 25 mg BID please. May send Rx to local pharmacy if he needs this now while waiting on the medication to be shipped to him.

## 2014-11-02 ENCOUNTER — Telehealth: Payer: Self-pay | Admitting: Adult Health

## 2014-11-02 NOTE — Telephone Encounter (Signed)
Patient notified that Rx called into Pharmacy

## 2014-11-08 ENCOUNTER — Encounter: Payer: Self-pay | Admitting: Gastroenterology

## 2014-11-30 ENCOUNTER — Encounter: Payer: Self-pay | Admitting: Gastroenterology

## 2014-11-30 ENCOUNTER — Ambulatory Visit (INDEPENDENT_AMBULATORY_CARE_PROVIDER_SITE_OTHER): Payer: Medicaid Other | Admitting: Gastroenterology

## 2014-11-30 ENCOUNTER — Telehealth: Payer: Self-pay

## 2014-11-30 ENCOUNTER — Ambulatory Visit: Payer: Medicaid Other | Admitting: Gastroenterology

## 2014-11-30 ENCOUNTER — Other Ambulatory Visit: Payer: Self-pay

## 2014-11-30 VITALS — BP 146/83 | HR 87 | Temp 97.4°F | Ht 69.0 in | Wt 185.4 lb

## 2014-11-30 DIAGNOSIS — K7460 Unspecified cirrhosis of liver: Secondary | ICD-10-CM

## 2014-11-30 DIAGNOSIS — Z1211 Encounter for screening for malignant neoplasm of colon: Secondary | ICD-10-CM | POA: Diagnosis not present

## 2014-11-30 DIAGNOSIS — B182 Chronic viral hepatitis C: Secondary | ICD-10-CM

## 2014-11-30 MED ORDER — NA SULFATE-K SULFATE-MG SULF 17.5-3.13-1.6 GM/177ML PO SOLN: 1.0000 | Freq: Once | ORAL | Status: DC

## 2014-11-30 MED ORDER — PEG-KCL-NACL-NASULF-NA ASC-C 100 G PO SOLR: 1.0000 | Freq: Once | ORAL | Status: DC

## 2014-11-30 NOTE — Patient Instructions (Signed)
We have scheduled you for an ultrasound to further evaluate your liver. If there is evidence of cirrhosis, you will need an upper endoscopy at time of your colonoscopy.  We have scheduled you for a colonoscopy with Dr. Oneida Alar in the near future!  We will submit for Harvoni after the ultrasound is completed.

## 2014-11-30 NOTE — Progress Notes (Signed)
Primary Care Physician:  Robert Bellow, MD Primary Gastroenterologist:  Dr. Oneida Alar   Chief Complaint  Patient presents with  . Hepatitis C    HPI:   Duane Adams is a 59 y.o. male presenting today at the request of his PCP secondary to Hep C.   States 15-16 years ago told he Hep C then told it was a "false positive". Denies jaundice, pruritis. No abdominal pain. No prior colonoscopy. No prior EGD. Hasn't had primary care for years. Notes history of hemorrhoids with low volume hematochezia intermittently. No constipation or diarrhea. Notes chest discomfort that is chronic. Was told maybe GI related. Doesn't think it is heartburn. No dysphagia. Was told he may have a cyst on his liver.   Homemade tattoos. Multiple sexual partners.   Past Medical History  Diagnosis Date  . Essential hypertension, benign   . Mixed hyperlipidemia   . Skin cancer   . Coronary artery disease     a. 03/2013 Cath/PCI: LM nl, LAD 24m(2.5x20 Promus Premier DES), LCX min irregs, RCA dom, 90p(3.0x20 Promus Premier DES), EF 55-65%.  . Tobacco abuse     Past Surgical History  Procedure Laterality Date  . 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 Hand reconstruction Left   . Leg tendon surgery Right   . Left heart catheterization with coronary angiogram N/A 04/11/2013    Procedure: LEFT HEART CATHETERIZATION WITH CORONARY ANGIOGRAM;  Surgeon: MBlane Ohara MD;  [facility];  Service: Cardiovascular;  Laterality: N/A;    Current Outpatient Prescriptions  Medication Sig Dispense Refill  . albuterol (PROVENTIL HFA;VENTOLIN HFA) 108 (90 BASE) MCG/ACT inhaler Inhale 1 puff into the lungs every 6 (six) hours as needed for wheezing or shortness of breath.    .Marland Kitchenaspirin EC 81 MG tablet Take 81 mg by mouth daily.    . B Complex-Folic Acid (B COMPLEX-VITAMIN B12) TABS Take 1 tablet by mouth daily.    . Choline Fenofibrate 135 MG capsule Take 135  mg by mouth daily.    . cloNIDine (CATAPRES - DOSED IN MG/24 HR) 0.1 mg/24hr patch Place 0.1 mg onto the skin once a week.    . clopidogrel (PLAVIX) 75 MG tablet Take 1 tablet (75 mg total) by mouth daily with breakfast. 30 tablet 6  . hydrochlorothiazide (HYDRODIURIL) 25 MG tablet Take 25 mg by mouth daily.    . metoprolol tartrate (LOPRESSOR) 25 MG tablet Take 1 tablet (25 mg total) by mouth 2 (two) times daily. 180 tablet 3  . Multiple Vitamin (MULTIVITAMIN) tablet Take 1 tablet by mouth daily.    . nitroGLYCERIN (NITROSTAT) 0.4 MG SL tablet Place 1 tablet (0.4 mg total) under the tongue every 5 (five) minutes as needed for chest pain. 25 tablet 3  . Omega-3 Fatty Acids (FISH OIL) 1000 MG CAPS Take 2,400 mg by mouth.    . Umeclidinium-Vilanterol 62.5-25 MCG/INH AEPB Inhale 1 puff into the lungs.     No current facility-administered medications for this visit.    Allergies as of 11/30/2014 - Review Complete 10/13/2014  Allergen Reaction Noted  . Lisinopril Swelling 08/18/2012  . Neosporin [neomycin-bacitracin zn-polymyx]  10/28/2013    Family History  Problem Relation Age of Onset  . Colon cancer Neg Hx     History   Social History  . Marital Status: Single    Spouse Name: N/A  .  Number of Children: N/A  . Years of Education: N/A   Occupational History  . Not on file.   Social History Main Topics  . Smoking status: Current Every Day Smoker -- 0.50 packs/day for 40 years    Types: Cigarettes    Start date: 10/10/1971  . Smokeless tobacco: Never Used     Comment: trying to wean down, now on 6-7 cigarettes a day  . Alcohol Use: 0.0 oz/week    0 Standard drinks or equivalent per week     Comment: a few beers once a month   . Drug Use: No  . Sexual Activity: Not on file   Other Topics Concern  . Not on file   Social History Narrative    Review of Systems: Gen: Denies any fever, chills, fatigue, weight loss, lack of appetite.  CV: +angina Resp: +DOE GI: see  HPI GU : Denies urinary burning, urinary frequency, urinary hesitancy MS: Denies joint pain, muscle weakness, cramps, or limitation of movement.  Derm: Denies rash, itching, dry skin Psych: Denies depression, anxiety, memory loss, and confusion Heme: Denies bruising, bleeding, and enlarged lymph nodes.  Physical Exam: BP 146/83 mmHg  Pulse 87  Temp(Src) 97.4 F (36.3 C) (Oral)  Ht _0  (1.753 m)  Wt 185 lb 6.4 oz (84.097 kg)  BMI 27.37 kg/m2 General:   Alert and oriented. Pleasant and cooperative. Well-nourished and well-developed.  Head:  Normocephalic and atraumatic. Eyes:  Without icterus, sclera clear and conjunctiva pink.  Ears:  Normal auditory acuity. Nose:  No deformity, discharge,  or lesions. Mouth:  No deformity or lesions, oral mucosa pink.  Lungs:  Clear to auscultation bilaterally. No wheezes, rales, or rhonchi. No distress.  Heart:  S1, S2 present without murmurs appreciated.  Abdomen:  +BS, soft, non-tender and non-distended. Liver margin smooth and palpable a few fingerbreadths below right costal margin No guarding or rebound. No masses appreciated.  Rectal:  Deferred  Msk:  Symmetrical without gross deformities. Normal posture. Extremities:  Without  edema. Neurologic:  Alert and  oriented x4;  grossly normal neurologically. Skin:  Intact without significant lesions or rashes. Psych:  Alert and cooperative. Normal mood and affect.  Outside labs March 2016: AST 70, ALT 92, Tbili 0.5, Alk Phos 82, Normal LFTs Dec 2015  Sept 2015:  Hep C antibody positive, Immune to Hep A. 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

## 2014-11-30 NOTE — Telephone Encounter (Signed)
Called and spoke with pt. He is aware to be fasting for [place].  [place] is on 12/15/2014 @ 715 and pre-op in Short Stay at 9:00am  PA # is [id number]

## 2014-12-01 ENCOUNTER — Ambulatory Visit: Payer: Medicaid Other | Admitting: Adult Health

## 2014-12-04 ENCOUNTER — Encounter: Payer: Medicaid Other | Admitting: Adult Health

## 2014-12-04 NOTE — Assessment & Plan Note (Signed)
Treatment-naive, genotype 1b, needs elastography. Will attempt to submit for Harvoni after elastography completed. Well-compensated. Discussed avoidance of ETOH indefinitely. If evidence of cirrhosis on elastography, he will need an EGD at time of screening colonoscopy.

## 2014-12-04 NOTE — Progress Notes (Signed)
,   Cardiology Office Note  ERROR No show

## 2014-12-04 NOTE — Assessment & Plan Note (Signed)
59 year old male with no prior lower GI evaluation. Intermittent chronic low-volume hematochezia noted likely benign anorectal source.   Proceed with colonoscopy with Dr. Oneida Alar in the near future. The risks, benefits, and alternatives have been discussed in detail with the patient. They state understanding and desire to proceed.  Propofol due to ETOH use

## 2014-12-06 NOTE — Progress Notes (Signed)
cc'ed to pcp °

## 2014-12-13 HISTORY — PX: COLONOSCOPY: SHX174

## 2014-12-14 NOTE — Patient Instructions (Signed)
Duane Adams  12/14/2014    Your procedure is scheduled on 12/19/14.  Report to [name] at 09:15 A.M.  Call this number if you have problems the morning of surgery:  [phone]   Remember:  Do not eat food or drink liquids after midnight.  Take these medicines the morning of surgery with A SIP OF WATER: Metoprolol. Make sure you wear your Clonidine patch. Use your Umeclidinium-Vilanterol inhaler before coming.   Do not wear jewelry, make-up or nail polish.  Do not wear lotions, powders, or perfumes.  You may wear deodorant.  Do not bring valuables to the hospital.  [facility] is not responsible for any belongings or valuables.  Contacts, dentures or bridgework may not be worn into surgery.  Leave your suitcase in the car.  After surgery it may be brought to your room.  For patients admitted to the hospital, discharge time will be determined by your treatment team.  Patients discharged the day of surgery will not be allowed to drive home.   Special instructions:  Start your bowel prep as directed by your [place] doctor.  Please read over the following fact sheets that you were given. Anesthesia Post-op Instructions and Care and Recovery After Surgery    Esophagogastroduodenoscopy Esophagogastroduodenoscopy (EGD) is a procedure to examine the lining of the esophagus, stomach, and first part of the small intestine (duodenum). A long, flexible, lighted tube with a camera attached (endoscope) is inserted down the throat to view these organs. This procedure is done to detect problems or abnormalities, such as inflammation, bleeding, ulcers, or growths, in order to treat them. The procedure lasts about 5-20 minutes. It is usually an outpatient procedure, but it may need to be performed in emergency cases in the hospital. LET YOUR CAREGIVER KNOW ABOUT:   Allergies to food or medicine.  All medicines you are taking, including vitamins, herbs, eyedrops, and  over-the-counter medicines and creams.  Use of steroids (by mouth or creams).  Previous problems you or members of your family have had with the use of anesthetics.  Any blood disorders you have.  Previous surgeries you have had.  Other health problems you have.  Possibility of pregnancy, if this applies. RISKS AND COMPLICATIONS  Generally, EGD is a safe procedure. However, as with any procedure, complications can occur. Possible complications include:  Infection.  Bleeding.  Tearing (perforation) of the esophagus, stomach, or duodenum.  Difficulty breathing or not being able to breath.  Excessive sweating.  Spasms of the larynx.  Slowed heartbeat.  Low blood pressure. BEFORE THE PROCEDURE  Do not eat or drink anything for 6-8 hours before the procedure or as directed by your caregiver.  Ask your caregiver about changing or stopping your regular medicines.  If you wear dentures, be prepared to remove them before the procedure.  Arrange for someone to drive you home after the procedure. PROCEDURE   A vein will be accessed to give medicines and fluids. A medicine to relax you (sedative) and a pain reliever will be given through that access into the vein.  A numbing medicine (local anesthetic) may be sprayed on your throat for comfort and to stop you from gagging or coughing.  A mouth guard may be placed in your mouth to protect your teeth and to keep you from biting on the endoscope.  You will be asked to lie on your left side.  The endoscope is inserted down your throat and into the esophagus, stomach, and duodenum.  Air is put through the endoscope to allow your caregiver to view the lining of your esophagus clearly.  The esophagus, stomach, and duodenum is then examined. During the exam, your caregiver may:  Remove tissue to be examined under a microscope (biopsy) for inflammation, infection, or other medical problems.  Remove growths.  Remove objects  (foreign bodies) that are stuck.  Treat any bleeding with medicines or other devices that stop tissues from bleeding (hot cautery, clipping devices).  Widen (dilate) or stretch narrowed areas of the esophagus and stomach.  The endoscope will then be withdrawn. AFTER THE PROCEDURE  You will be taken to a recovery area to be monitored. You will be able to go home once you are stable and alert.  Do not eat or drink anything until the local anesthetic and numbing medicines have worn off. You may choke.  It is normal to feel bloated, have pain with swallowing, or have a sore throat for a short time. This will wear off.  Your caregiver should be able to discuss his or her findings with you. It will take longer to discuss the test results if any biopsies were taken. Document Released: 10/31/2004 Document Revised: 11/14/2013 Document Reviewed: 06/02/2012 [facility] Patient Information 2015 [place], [place]. This information is not intended to replace advice given to you by your health care provider. Make sure you discuss any questions you have with your health care provider.    Colonoscopy A colonoscopy is an exam to look at the entire large intestine (colon). This exam can help find problems such as tumors, polyps, inflammation, and areas of bleeding. The exam takes about 1 hour.  LET [facility] CARE PROVIDER KNOW ABOUT:   Any allergies you have.  All medicines you are taking, including vitamins, herbs, eye drops, creams, and over-the-counter medicines.  Previous problems you or members of your family have had with the use of anesthetics.  Any blood disorders you have.  Previous surgeries you have had.  Medical conditions you have. RISKS AND COMPLICATIONS  Generally, this is a safe procedure. However, as with any procedure, complications can occur. Possible complications include:  Bleeding.  Tearing or rupture of the colon wall.  Reaction to medicines given during the  exam.  Infection (rare). BEFORE THE PROCEDURE   Ask your health care provider about changing or stopping your regular medicines.  You may be prescribed an oral bowel prep. This involves drinking a large amount of medicated liquid, starting the day before your procedure. The liquid will cause you to have multiple loose stools until your stool is almost clear or light green. This cleans out your colon in preparation for the procedure.  Do not eat or drink anything else once you have started the bowel prep, unless your health care provider tells you it is safe to do so.  Arrange for someone to drive you home after the procedure. PROCEDURE   You will be given medicine to help you relax (sedative).  You will lie on your side with your knees bent.  A long, flexible tube with a light and camera on the end (colonoscope) will be inserted through the rectum and into the colon. The camera sends video back to a computer screen as it moves through the colon. The colonoscope also releases carbon dioxide gas to inflate the colon. This helps your health care provider see the area better.  During the exam, your health care provider may take a small tissue sample (biopsy) to be examined under a microscope  if any abnormalities are found.  The exam is finished when the entire colon has been viewed. AFTER THE PROCEDURE   Do not drive for 24 hours after the exam.  You may have a small amount of blood in your stool.  You may pass moderate amounts of gas and have mild abdominal cramping or bloating. This is caused by the gas used to inflate your colon during the exam.  Ask when your test results will be ready and how you will get your results. Make sure you get your test results. Document Released: 06/27/2000 Document Revised: 04/20/2013 Document Reviewed: 03/07/2013 [facility] Patient Information 2015 [place], [place]. This information is not intended to replace advice given to you by your health care  provider. Make sure you discuss any questions you have with your health care provider.    Monitored Anesthesia Care Monitored anesthesia care is an anesthesia service for a medical procedure. Anesthesia is the loss of the ability to feel pain. It is produced by medicines called anesthetics. It may affect a small area of your body (local anesthesia), a large area of your body (regional anesthesia), or your entire body (general anesthesia). The need for monitored anesthesia care depends your procedure, your condition, and the potential need for regional or general anesthesia. It is often provided during procedures where:   General anesthesia may be needed if there are complications. This is because you need special care when you are under general anesthesia.   You will be under local or regional anesthesia. This is so that you are able to have higher levels of anesthesia if needed.   You will receive calming medicines (sedatives). This is especially the case if sedatives are given to put you in a semi-conscious state of relaxation (deep sedation). This is because the amount of sedative needed to produce this state can be hard to predict. Too much of a sedative can produce general anesthesia. Monitored anesthesia care is performed by one or more health care providers who have special training in all types of anesthesia. You will need to meet with these health care providers before your procedure. During this meeting, they will ask you about your medical history. They will also give you instructions to follow. (For example, you will need to stop eating and drinking before your procedure. You may also need to stop or change medicines you are taking.) During your procedure, your health care providers will stay with you. They will:   Watch your condition. This includes watching your blood pressure, breathing, and level of pain.   Diagnose and treat problems that occur.   Give medicines if they are  needed. These may include calming medicines (sedatives) and anesthetics.   Make sure you are comfortable.  Having monitored anesthesia care does not necessarily mean that you will be under anesthesia. It does mean that your health care providers will be able to manage anesthesia if you need it or if it occurs. It also means that you will be able to have a different type of anesthesia than you are having if you need it. When your procedure is complete, your health care providers will continue to watch your condition. They will make sure any medicines wear off before you are allowed to go home.  Document Released: 03/26/2005 Document Revised: 11/14/2013 Document Reviewed: 08/11/2012 [facility] Patient Information 2015 [place], [place]. This information is not intended to replace advice given to you by your health care provider. Make sure you discuss any questions you have with your  health care provider.  

## 2014-12-15 ENCOUNTER — Ambulatory Visit (HOSPITAL_COMMUNITY)
Admission: RE | Admit: 2014-12-15 | Discharge: 2014-12-15 | Disposition: A | Discharge status: Home / Self Care | Payer: Medicaid Other | Source: Ambulatory Visit | Attending: Gastroenterology | Admitting: Gastroenterology

## 2014-12-15 ENCOUNTER — Ambulatory Visit (HOSPITAL_COMMUNITY): Admission: RE | Admit: 2014-12-15 | Payer: Medicaid Other | Source: Ambulatory Visit

## 2014-12-15 ENCOUNTER — Encounter (HOSPITAL_COMMUNITY)
Admission: RE | Admit: 2014-12-15 | Discharge: 2014-12-15 | Disposition: A | Discharge status: Home / Self Care | Payer: Medicaid Other | Source: Ambulatory Visit | Attending: Gastroenterology | Admitting: Gastroenterology

## 2014-12-15 ENCOUNTER — Encounter (HOSPITAL_COMMUNITY): Payer: Self-pay

## 2014-12-15 DIAGNOSIS — I2510 Atherosclerotic heart disease of native coronary artery without angina pectoris: Secondary | ICD-10-CM | POA: Insufficient documentation

## 2014-12-15 DIAGNOSIS — N281 Cyst of kidney, acquired: Secondary | ICD-10-CM | POA: Insufficient documentation

## 2014-12-15 DIAGNOSIS — E782 Mixed hyperlipidemia: Secondary | ICD-10-CM | POA: Diagnosis not present

## 2014-12-15 DIAGNOSIS — Z87891 Personal history of nicotine dependence: Secondary | ICD-10-CM | POA: Diagnosis not present

## 2014-12-15 DIAGNOSIS — K7460 Unspecified cirrhosis of liver: Secondary | ICD-10-CM | POA: Diagnosis present

## 2014-12-15 HISTORY — DX: Inflammatory liver disease, unspecified: K75.9

## 2014-12-15 LAB — BASIC METABOLIC PANEL
ANION GAP: 10 (ref 5–15)
BUN: 18 mg/dL (ref 6–20)
CALCIUM: 9.9 mg/dL (ref 8.9–10.3)
CO2: 29 mmol/L (ref 22–32)
CREATININE: 1.03 mg/dL (ref 0.61–1.24)
Chloride: 103 mmol/L (ref 101–111)
GFR calc Af Amer: >60 mL/min (ref >60)
GFR calc non Af Amer: >60 mL/min (ref >60)
Glucose, Bld: 108 mg/dL — ABNORMAL HIGH (ref 65–99)
Potassium: 4.3 mmol/L (ref 3.5–5.1)
Sodium: 142 mmol/L (ref 135–145)

## 2014-12-15 LAB — CBC
HCT: 50.3 % (ref 39.0–52.0)
Hemoglobin: 17.4 g/dL — ABNORMAL HIGH (ref 13.0–17.0)
MCH: 32.9 pg (ref 26.0–34.0)
MCHC: 34.6 g/dL (ref 30.0–36.0)
MCV: 95.1 fL (ref 78.0–100.0)
PLATELETS: 216 10*3/uL (ref 150–400)
RBC: 5.29 MIL/uL (ref 4.22–5.81)
RDW: 13.9 % (ref 11.5–15.5)
WBC: 9 10*3/uL (ref 4.0–10.5)

## 2014-12-19 ENCOUNTER — Encounter (HOSPITAL_COMMUNITY)
Admission: RE | Disposition: A | Discharge status: Home / Self Care | Payer: Self-pay | Source: Ambulatory Visit | Attending: Gastroenterology

## 2014-12-19 ENCOUNTER — Ambulatory Visit (HOSPITAL_COMMUNITY): Payer: Medicaid Other | Admitting: Anesthesiology

## 2014-12-19 ENCOUNTER — Encounter (HOSPITAL_COMMUNITY): Payer: Self-pay | Admitting: *Deleted

## 2014-12-19 ENCOUNTER — Telehealth: Payer: Self-pay | Admitting: Gastroenterology

## 2014-12-19 ENCOUNTER — Ambulatory Visit (HOSPITAL_COMMUNITY)
Admission: RE | Admit: 2014-12-19 | Discharge: 2014-12-19 | Disposition: A | Discharge status: Home / Self Care | Payer: Medicaid Other | Source: Ambulatory Visit | Attending: Gastroenterology | Admitting: Gastroenterology

## 2014-12-19 DIAGNOSIS — D125 Benign neoplasm of sigmoid colon: Secondary | ICD-10-CM

## 2014-12-19 DIAGNOSIS — K621 Rectal polyp: Secondary | ICD-10-CM | POA: Diagnosis not present

## 2014-12-19 DIAGNOSIS — K2980 Duodenitis without bleeding: Secondary | ICD-10-CM | POA: Insufficient documentation

## 2014-12-19 DIAGNOSIS — E782 Mixed hyperlipidemia: Secondary | ICD-10-CM | POA: Insufficient documentation

## 2014-12-19 DIAGNOSIS — D128 Benign neoplasm of rectum: Secondary | ICD-10-CM | POA: Insufficient documentation

## 2014-12-19 DIAGNOSIS — Z1211 Encounter for screening for malignant neoplasm of colon: Secondary | ICD-10-CM

## 2014-12-19 DIAGNOSIS — F17210 Nicotine dependence, cigarettes, uncomplicated: Secondary | ICD-10-CM | POA: Diagnosis not present

## 2014-12-19 DIAGNOSIS — K648 Other hemorrhoids: Secondary | ICD-10-CM | POA: Diagnosis not present

## 2014-12-19 DIAGNOSIS — I10 Essential (primary) hypertension: Secondary | ICD-10-CM | POA: Insufficient documentation

## 2014-12-19 DIAGNOSIS — I2510 Atherosclerotic heart disease of native coronary artery without angina pectoris: Secondary | ICD-10-CM | POA: Diagnosis not present

## 2014-12-19 DIAGNOSIS — Z955 Presence of coronary angioplasty implant and graft: Secondary | ICD-10-CM | POA: Insufficient documentation

## 2014-12-19 DIAGNOSIS — Z85828 Personal history of other malignant neoplasm of skin: Secondary | ICD-10-CM | POA: Insufficient documentation

## 2014-12-19 DIAGNOSIS — K2970 Gastritis, unspecified, without bleeding: Secondary | ICD-10-CM | POA: Diagnosis not present

## 2014-12-19 DIAGNOSIS — D123 Benign neoplasm of transverse colon: Secondary | ICD-10-CM

## 2014-12-19 DIAGNOSIS — Z9861 Coronary angioplasty status: Secondary | ICD-10-CM | POA: Insufficient documentation

## 2014-12-19 DIAGNOSIS — K2950 Unspecified chronic gastritis without bleeding: Secondary | ICD-10-CM | POA: Insufficient documentation

## 2014-12-19 DIAGNOSIS — Z7982 Long term (current) use of aspirin: Secondary | ICD-10-CM | POA: Diagnosis not present

## 2014-12-19 DIAGNOSIS — I8500 Esophageal varices without bleeding: Secondary | ICD-10-CM | POA: Diagnosis present

## 2014-12-19 HISTORY — PX: BIOPSY: SHX5522

## 2014-12-19 HISTORY — PX: COLONOSCOPY WITH PROPOFOL: SHX5780

## 2014-12-19 HISTORY — PX: ESOPHAGOGASTRODUODENOSCOPY (EGD) WITH PROPOFOL: SHX5813

## 2014-12-19 HISTORY — PX: POLYPECTOMY: SHX5525

## 2014-12-19 SURGERY — COLONOSCOPY WITH PROPOFOL
Anesthesia: Monitor Anesthesia Care | Site: Esophagus

## 2014-12-19 MED ORDER — MIDAZOLAM HCL 2 MG/2ML IJ SOLN
INTRAMUSCULAR | Status: AC
  Filled 2014-12-19: qty 2

## 2014-12-19 MED ORDER — DEXLANSOPRAZOLE 60 MG PO CPDR: DELAYED_RELEASE_CAPSULE | ORAL | Status: DC

## 2014-12-19 MED ORDER — PROPOFOL INFUSION 10 MG/ML OPTIME
INTRAVENOUS | Status: DC | PRN
  Administered 2014-12-19 (×2): via INTRAVENOUS
  Administered 2014-12-19: 150 ug/kg/min via INTRAVENOUS

## 2014-12-19 MED ORDER — ONDANSETRON HCL 4 MG/2ML IJ SOLN: 4.0000 mg | Freq: Once | INTRAMUSCULAR | Status: DC | PRN

## 2014-12-19 MED ORDER — LIDOCAINE VISCOUS 2 % MT SOLN
6.0000 mL | Freq: Once | OROMUCOSAL | Status: AC
  Administered 2014-12-19: 6 mL via OROMUCOSAL

## 2014-12-19 MED ORDER — SPOT INK MARKER SYRINGE KIT
PACK | SUBMUCOSAL | Status: DC | PRN
  Administered 2014-12-19: 5 mL via SUBMUCOSAL

## 2014-12-19 MED ORDER — LIDOCAINE HCL (CARDIAC) 10 MG/ML IV SOLN
INTRAVENOUS | Status: DC | PRN
  Administered 2014-12-19: 50 mg via INTRAVENOUS

## 2014-12-19 MED ORDER — PROPOFOL 10 MG/ML IV BOLUS
INTRAVENOUS | Status: AC
  Filled 2014-12-19: qty 20

## 2014-12-19 MED ORDER — FENTANYL CITRATE (PF) 100 MCG/2ML IJ SOLN
25.0000 ug | INTRAMUSCULAR | Status: DC | PRN
Start: 2014-12-19 — End: 2014-12-19

## 2014-12-19 MED ORDER — FENTANYL CITRATE (PF) 100 MCG/2ML IJ SOLN
INTRAMUSCULAR | Status: AC
  Filled 2014-12-19: qty 2

## 2014-12-19 MED ORDER — MIDAZOLAM HCL 2 MG/2ML IJ SOLN
1.0000 mg | INTRAMUSCULAR | Status: DC | PRN
  Administered 2014-12-19 (×2): 1 mg via INTRAVENOUS
  Administered 2014-12-19: 2 mg via INTRAVENOUS

## 2014-12-19 MED ORDER — LIDOCAINE VISCOUS 2 % MT SOLN
OROMUCOSAL | Status: AC
  Administered 2014-12-19: 6 mL via OROMUCOSAL
  Filled 2014-12-19: qty 15

## 2014-12-19 MED ORDER — LIDOCAINE HCL (PF) 1 % IJ SOLN
INTRAMUSCULAR | Status: AC
  Filled 2014-12-19: qty 5

## 2014-12-19 MED ORDER — STERILE WATER FOR IRRIGATION IR SOLN
Status: DC | PRN
  Administered 2014-12-19: 1000 mL

## 2014-12-19 MED ORDER — FENTANYL CITRATE (PF) 100 MCG/2ML IJ SOLN
INTRAMUSCULAR | Status: DC | PRN
  Administered 2014-12-19: 25 ug via INTRAVENOUS
  Administered 2014-12-19: 50 ug via INTRAVENOUS
  Administered 2014-12-19: 25 ug via INTRAVENOUS

## 2014-12-19 MED ORDER — LACTATED RINGERS IV SOLN
INTRAVENOUS | Status: DC
  Administered 2014-12-19: 10:00:00 via INTRAVENOUS

## 2014-12-19 MED ORDER — FENTANYL CITRATE (PF) 100 MCG/2ML IJ SOLN
25.0000 ug | INTRAMUSCULAR | Status: AC
  Administered 2014-12-19 (×2): 25 ug via INTRAVENOUS

## 2014-12-19 MED ORDER — WATER FOR IRRIGATION, STERILE IR SOLN
Status: DC | PRN
  Administered 2014-12-19: 1000 mL via SURGICAL_CAVITY

## 2014-12-19 SURGICAL SUPPLY — 25 items
BLOCK BITE 60FR ADLT L/F BLUE (MISCELLANEOUS) ×2 IMPLANT
ELECT REM PT RETURN 9FT ADLT (ELECTROSURGICAL) ×5
ELECTRODE REM PT RTRN 9FT ADLT (ELECTROSURGICAL) IMPLANT
FCP BXJMBJMB 240X2.8X (CUTTING FORCEPS)
FLOOR PAD 36X40 (MISCELLANEOUS) ×5
FORCEPS BIOP RAD 4 LRG CAP 4 (CUTTING FORCEPS) ×2 IMPLANT
FORCEPS BIOP RJ4 240 W/NDL (CUTTING FORCEPS)
FORCEPS BXJMBJMB 240X2.8X (CUTTING FORCEPS) IMPLANT
FORMALIN 10 PREFIL 20ML (MISCELLANEOUS) ×10 IMPLANT
INJECTOR/SNARE I SNARE (MISCELLANEOUS) IMPLANT
KIT ENDO PROCEDURE PEN (KITS) ×5 IMPLANT
MANIFOLD NEPTUNE II (INSTRUMENTS) ×5 IMPLANT
NDL SCLEROTHERAPY 25GX240 (NEEDLE) IMPLANT
NEEDLE SCLEROTHERAPY 25GX240 (NEEDLE) ×5 IMPLANT
OVERTUBE ENDOCUFF GREEN (MISCELLANEOUS) ×5 IMPLANT
PAD FLOOR 36X40 (MISCELLANEOUS) ×3 IMPLANT
PROBE APC STR FIRE (PROBE) IMPLANT
PROBE INJECTION GOLD (MISCELLANEOUS)
PROBE INJECTION GOLD 7FR (MISCELLANEOUS) IMPLANT
SNARE ROTATE MED OVAL 20MM (MISCELLANEOUS) ×2 IMPLANT
SNARE SHORT THROW 13M SML OVAL (MISCELLANEOUS) ×5 IMPLANT
SYR INFLATION 60ML (SYRINGE) IMPLANT
TRAP SPECIMEN MUCOUS 40CC (MISCELLANEOUS) ×2 IMPLANT
TUBING IRRIGATION ENDOGATOR (MISCELLANEOUS) ×2 IMPLANT
WATER STERILE IRR 1000ML POUR (IV SOLUTION) ×4 IMPLANT

## 2014-12-19 NOTE — Anesthesia Preprocedure Evaluation (Signed)
Anesthesia Evaluation  Patient identified by MRN, date of birth, ID band Patient awake    Reviewed: Allergy & Precautions, NPO status , Patient's Chart, lab work & pertinent test results, reviewed documented beta blocker date and time   Airway Mallampati: II  TM Distance: >3 FB     Dental  (+) Edentulous Upper, Poor Dentition, Missing, Chipped   Pulmonary Current Smoker,  breath sounds clear to auscultation        Cardiovascular hypertension, Pt. on medications and Pt. on home beta blockers - angina+ CAD and + Cardiac Stents Rhythm:Regular Rate:Normal     Neuro/Psych    GI/Hepatic (+) Hepatitis -, C  Endo/Other    Renal/GU      Musculoskeletal   Abdominal   Peds  Hematology   Anesthesia Other Findings   Reproductive/Obstetrics                             Anesthesia Physical Anesthesia Plan  ASA: III  Anesthesia Plan: MAC   Post-op Pain Management:    Induction: Intravenous  Airway Management Planned: Simple Face Mask  Additional Equipment:   Intra-op Plan:   Post-operative Plan:   Informed Consent: I have reviewed the patients History and Physical, chart, labs and discussed the procedure including the risks, benefits and alternatives for the proposed anesthesia with the patient or authorized representative who has indicated his/her understanding and acceptance.     Plan Discussed with:   Anesthesia Plan Comments:         Anesthesia Quick Evaluation

## 2014-12-19 NOTE — Op Note (Signed)
[facility] [address] [place], [address]   ENDOSCOPY PROCEDURE REPORT  PATIENT: Duane Adams, Duane Adams  MR#: [id number] BIRTHDATE: 1956/04/20 , 20  yrs. old GENDER: male  ENDOSCOPIST: Danie Binder, MD REFERRED KC:[id number] McElroy, PA-C  PROCEDURE DATE: 14-Jan-2015 PROCEDURE:   EGD w/ biopsy  INDICATIONS:screening for varices. MEDICATIONS: Monitored anesthesia care TOPICAL ANESTHETIC: ASA CLASS:  DESCRIPTION OF PROCEDURE:     Physical exam was performed.  Informed consent was obtained from the patient after explaining the benefits, risks, and alternatives to the procedure.  The patient was connected to the monitor and placed in the left lateral position.  Continuous oxygen was provided by nasal cannula and IV medicine administered through an indwelling cannula.  After administration of sedation, the patients esophagus was intubated and the     endoscope was advanced under direct visualization to the second portion of the duodenum.  The scope was removed slowly by carefully examining the color, texture, anatomy, and integrity of the mucosa on the way out.  The patient was recovered in endoscopy and discharged home in satisfactory condition.   ESOPHAGUS: The mucosa of the esophagus appeared normal.   STOMACH: A small hiatal hernia was noted.   Moderate non-erosive gastritis (inflammation) was found.  Multiple biopsies were performed using cold forceps.   DUODENUM: Mild duodenal inflammation was found in the duodenal bulb.   The duodenal mucosa showed no abnormalities in the 2nd part of the duodenum. COMPLICATIONS: There were no immediate complications.  ENDOSCOPIC IMPRESSION: 1.   NO VARICES 2.   Small hiatal hernia 3.   MODERTAE Non-erosive gastritis AND MILD DUODENITIS  RECOMMENDATIONS: FOLLOW A HIGH FIBER/LOW FAT DIET. IF POSSIBLE, AVOID ITEMS THAT TRIGGER GASTRITIS. START DEXILANT. AWAIT BIOPSY RESULTS. Next colonoscopy in [place] [facility] WITH MAC.  REPEAT EXAM: eSigned:  Danie Binder, MD 01/14/2015 3:44 PM     CPT CODES: ICD CODES:  The ICD and CPT codes recommended by this software are interpretations from the data that the clinical staff has captured with the software.  The verification of the translation of this report to the ICD and CPT codes and modifiers is the sole responsibility of the health care institution and practicing physician where this report was generated.  [facility]. will not be held responsible for the validity of the ICD and CPT codes included on this report.  AMA assumes no liability for data contained or not contained herein. CPT is a Designer, television/film set of the [employer].

## 2014-12-19 NOTE — Anesthesia Postprocedure Evaluation (Signed)
  Anesthesia Post-op Note  Patient: Duane Adams  Procedure(s) Performed: Procedure(s) with comments: COLONOSCOPY WITH PROPOFOL (N/A) - In cecum @ 1131, out @ 11:54 ESOPHAGOGASTRODUODENOSCOPY (EGD) WITH PROPOFOL (N/A) POLYPECTOMY (N/A) BIOPSY (N/A)  Patient Location: PACU  Anesthesia Type:MAC  Level of Consciousness: awake, alert , oriented and patient cooperative  Airway and Oxygen Therapy: Patient Spontanous Breathing and Patient connected to nasal cannula oxygen  Post-op Pain: none  Post-op Assessment: Post-op Vital signs reviewed, Patient's Cardiovascular Status Stable, Respiratory Function Stable, Patent Airway and No signs of Nausea or vomiting  Post-op Vital Signs: Reviewed and stable  Last Vitals:  Filed Vitals:   12/19/14 1100  BP: 120/81  Pulse:   Temp:   Resp: 13    Complications: No apparent anesthesia complications

## 2014-12-19 NOTE — Discharge Instructions (Signed)
You had 9 polyps removed. ONE POLYP COULD NOT BE REMOVED BECAUSE IT WAS FLAT AND NEEDS TO REMOVED BY A SPECIAL TECHNIQUE. IT WAS BIOPSIED. I TATTOOED THE BASE OF TWO OF YOUR POLYPS. You have LARGE internal hemorrhoids.   You have gastritis, DUODENITIS, & a SMALL HIATAL HERNIA. I biopsied your stomach.   FOLLOW A HIGH FIBER/LOW FAT DIET. AVOID ITEMS THAT CAUSE BLOATING. SEE INFO BELOW.  IF POSSIBLE, AVOID ITEMS THAT TRIGGER GASTRITIS. SEE INFO BELOW.  START DEXILANT.  YOUR BIOPSY RESULTS WILL BE AVAILABLE IN MY CHART AFTER JUN 9 AND MY OFFICE WILL CONTACT YOU IN 10-14 DAYS WITH YOUR RESULTS.   Next colonoscopy in [place]. TAKE YOUR CD WITH YOU TO YOUR VISIT.   ENDOSCOPY Care After Read the instructions outlined below and refer to this sheet in the next week. These discharge instructions provide you with general information on caring for yourself after you leave the hospital. While your treatment has been planned according to the most current medical practices available, unavoidable complications occasionally occur. If you have any problems or questions after discharge, call DR. Neeka Urista, [phone].  ACTIVITY  You may resume your regular activity, but move at a slower pace for the next 24 hours.   Take frequent rest periods for the next 24 hours.   Walking will help get rid of the air and reduce the bloated feeling in your belly (abdomen).   No driving for 24 hours (because of the medicine (anesthesia) used during the test).   You may shower.   Do not sign any important legal documents or operate any machinery for 24 hours (because of the anesthesia used during the test).    NUTRITION  Drink plenty of fluids.   You may resume your normal diet as instructed by your doctor.   Begin with a light meal and progress to your normal diet. Heavy or fried foods are harder to digest and may make you feel sick to your stomach (nauseated).   Avoid alcoholic  beverages for 24 hours or as instructed.    MEDICATIONS  You may resume your normal medications.   WHAT YOU CAN EXPECT TODAY  Some feelings of bloating in the abdomen.   Passage of more gas than usual.   Spotting of blood in your stool or on the toilet paper  .  IF YOU HAD POLYPS REMOVED DURING THE ENDOSCOPY:  Eat a soft diet IF YOU HAVE NAUSEA, BLOATING, ABDOMINAL PAIN, OR VOMITING.    FINDING OUT THE RESULTS OF YOUR TEST Not all test results are available during your visit. DR. Oneida Alar WILL CALL YOU WITHIN 14 DAYS OF YOUR PROCEDUE WITH YOUR RESULTS. Do not assume everything is normal if you have not heard from DR. Sequoya Hogsett, CALL HER OFFICE AT [phone].  SEEK IMMEDIATE MEDICAL ATTENTION AND CALL THE OFFICE: [phone] IF:  You have more than a spotting of blood in your stool.   Your belly is swollen (abdominal distention).   You are nauseated or vomiting.   You have a temperature over 101F.   You have abdominal pain or discomfort that is severe or gets worse throughout the day.   Gastritis/DUODENITIS  Gastritis is an inflammation (the body's way of reacting to injury and/or infection) of the stomach. DUODENITIS is an inflammation (the body's way of reacting to injury and/or infection) of the FIRST PART OF THE SMALL INTESTINES. It is often caused by bacterial (germ) infections. It can also be caused BY ASPIRIN,  BC/GOODY POWDER'S, (IBUPROFEN) MOTRIN, OR ALEVE (NAPROXEN), chemicals (including alcohol), SPICY FOODS, and medications. This illness may be associated with generalized malaise (feeling tired, not well), UPPER ABDOMINAL STOMACH cramps, and fever. One common bacterial cause of gastritis is an organism known as H. Pylori. This can be treated with antibiotics.    Hiatal Hernia A hiatal hernia occurs when a part of the stomach slides above the diaphragm. The diaphragm is the thin muscle separating the belly (abdomen) from the chest. A hiatal hernia can be something  you are born with or develop over time. Hiatal hernias may allow stomach acid to flow back into your esophagus, the tube which carries food from your mouth to your stomach. If this acid causes problems it is called GERD (gastro-esophageal reflux disease).   SYMPTOMS Common symptoms of GERD are heartburn (burning in your chest). This is worse when lying down or bending over. It may also cause belching and indigestion. Some of the things which make GERD worse are:  Increased weight pushes on stomach making acid rise more easily.   Smoking markedly increases acid production.   Alcohol decreases lower esophageal sphincter pressure (valve between stomach and esophagus), allowing acid from stomach into esophagus.   HOME CARE INSTRUCTIONS  Try to achieve and maintain an ideal body weight.   Avoid drinking alcoholic beverages.   DO NOT smokE.   Do not wear tight clothing around your chest or stomach.   Eat smaller meals and eat more frequently. This keeps your stomach from getting too full. Eat slowly.   Do not lie down for 2 or 3 hours after eating. Do not eat or drink anything 1 to 2 hours before going to bed.   Avoid caffeine beverages (colas, coffee, cocoa, tea), fatty foods, citrus fruits and all other foods and drinks that contain acid and that seem to increase the problems.   Avoid bending over, especially after eating OR STRAINING. Anything that increases the pressure in your belly increases the amount of acid that may be pushed up into your esophagus.    High-Fiber Diet A high-fiber diet changes your normal diet to include more whole grains, legumes, fruits, and vegetables. Changes in the diet involve replacing refined carbohydrates with unrefined foods. The calorie level of the diet is essentially unchanged. The Dietary Reference Intake (recommended amount) for adult males is 38 grams per day. For adult females, it is 25 grams per day. Pregnant and lactating women should consume 28  grams of fiber per day. Fiber is the intact part of a plant that is not broken down during digestion. Functional fiber is fiber that has been isolated from the plant to provide a beneficial effect in the body. PURPOSE  Increase stool bulk.   Ease and regulate bowel movements.   Lower cholesterol.   REDUCE RISK OF COLON CANCER  INDICATIONS THAT YOU NEED MORE FIBER  Constipation and hemorrhoids.   Uncomplicated diverticulosis (intestine condition) and irritable bowel syndrome.   Weight management.   As a protective measure against hardening of the arteries (atherosclerosis), diabetes, and cancer.   GUIDELINES FOR INCREASING FIBER IN THE DIET  Start adding fiber to the diet slowly. A gradual increase of about 5 more grams (2 slices of whole-wheat bread, 2 servings of most fruits or vegetables, or 1 bowl of high-fiber cereal) per day is best. Too rapid an increase in fiber may result in constipation, flatulence, and bloating.   Drink enough water and fluids to keep your urine clear or pale  yellow. Water, juice, or caffeine-free drinks are recommended. Not drinking enough fluid may cause constipation.   Eat a variety of high-fiber foods rather than one type of fiber.   Try to increase your intake of fiber through using high-fiber foods rather than fiber pills or supplements that contain small amounts of fiber.   The goal is to change the types of food eaten. Do not supplement your present diet with high-fiber foods, but replace foods in your present diet.  INCLUDE A VARIETY OF FIBER SOURCES  Replace refined and processed grains with whole grains, canned fruits with fresh fruits, and incorporate other fiber sources. White rice, white breads, and most bakery goods contain little or no fiber.   Brown whole-grain rice, buckwheat oats, and many fruits and vegetables are all good sources of fiber. These include: broccoli, Brussels sprouts, cabbage, cauliflower, beets, sweet potatoes, white  potatoes (skin on), carrots, tomatoes, eggplant, squash, berries, fresh fruits, and dried fruits.   Cereals appear to be the richest source of fiber. Cereal fiber is found in whole grains and bran. Bran is the fiber-rich outer coat of cereal grain, which is largely removed in refining. In whole-grain cereals, the bran remains. In breakfast cereals, the largest amount of fiber is found in those with "bran" in their names. The fiber content is sometimes indicated on the label.   You may need to include additional fruits and vegetables each day.   In baking, for 1 cup white flour, you may use the following substitutions:   1 cup whole-wheat flour minus 2 tablespoons.   1/2 cup white flour plus 1/2 cup whole-wheat flour.   Low-Fat Diet BREADS, CEREALS, PASTA, RICE, DRIED PEAS, AND BEANS These products are high in carbohydrates and most are low in fat. Therefore, they can be increased in the diet as substitutes for fatty foods. They too, however, contain calories and should not be eaten in excess. Cereals can be eaten for snacks as well as for breakfast.  Include foods that contain fiber (fruits, vegetables, whole grains, and legumes). Research shows that fiber may lower blood cholesterol levels, especially the water-soluble fiber found in fruits, vegetables, oat products, and legumes. FRUITS AND VEGETABLES It is good to eat fruits and vegetables. Besides being sources of fiber, both are rich in vitamins and some minerals. They help you get the daily allowances of these nutrients. Fruits and vegetables can be used for snacks and desserts. MEATS Limit lean meat, chicken, [place], and fish to no more than 6 ounces per day. Beef, Pork, and Lamb Use lean cuts of beef, pork, and lamb. Lean cuts include:  Extra-lean ground beef.  Arm roast.  Sirloin tip.  Center-cut ham.  Round steak.  Loin chops.  Rump roast.  Tenderloin.  Trim all fat off the outside of meats before cooking. It is not necessary  to severely decrease the intake of red meat, but lean choices should be made. Lean meat is rich in protein and contains a highly absorbable form of iron. Premenopausal women, in particular, should avoid reducing lean red meat because this could increase the risk for low red blood cells (iron-deficiency anemia). The organ meats, such as liver, sweetbreads, kidneys, and brain are very rich in cholesterol. They should be limited. Chicken and [place] These are good sources of protein. The fat of poultry can be reduced by removing the skin and underlying fat layers before cooking. Chicken and [place] can be substituted for lean red meat in the diet. Poultry should not be  fried or covered with high-fat sauces. Fish and Shellfish Fish is a good source of protein. Shellfish contain cholesterol, but they usually are low in saturated fatty acids. The preparation of fish is important. Like chicken and [place], they should not be fried or covered with high-fat sauces. EGGS Egg whites contain no fat or cholesterol. They can be eaten often. Try 1 to 2 egg whites instead of whole eggs in recipes or use egg substitutes that do not contain yolk. MILK AND DAIRY PRODUCTS Use skim or 1% milk instead of 2% or whole milk. Decrease whole milk, natural, and processed cheeses. Use nonfat or low-fat (2%) cottage cheese or low-fat cheeses made from vegetable oils. Choose nonfat or low-fat (1 to 2%) yogurt. Experiment with evaporated skim milk in recipes that call for heavy cream. Substitute low-fat yogurt or low-fat cottage cheese for sour cream in dips and salad dressings. Have at least 2 servings of low-fat dairy products, such as 2 glasses of skim (or 1%) milk each day to help get your daily calcium intake.  FATS AND OILS Reduce the total intake of fats, especially saturated fat. Butterfat, lard, and beef fats are high in saturated fat and cholesterol. These should be avoided as much as possible. Vegetable fats do not contain  cholesterol, but certain vegetable fats, such as coconut oil, palm oil, and palm kernel oil are very high in saturated fats. These should be limited. These fats are often used in bakery goods, processed foods, popcorn, oils, and nondairy creamers. Vegetable shortenings and some peanut butters contain hydrogenated oils, which are also saturated fats. Read the labels on these foods and check for saturated vegetable oils. Unsaturated vegetable oils and fats do not raise blood cholesterol. However, they should be limited because they are fats and are high in calories. Total fat should still be limited to 30% of your daily caloric intake. Desirable liquid vegetable oils are corn oil, cottonseed oil, olive oil, canola oil, safflower oil, soybean oil, and sunflower oil. Peanut oil is not as good, but small amounts are acceptable. Buy a heart-healthy tub margarine that has no partially hydrogenated oils in the ingredients. Mayonnaise and salad dressings often are made from unsaturated fats, but they should also be limited because of their high calorie and fat content. Seeds, nuts, peanut butter, olives, and avocados are high in fat, but the fat is mainly the unsaturated type. These foods should be limited mainly to avoid excess calories and fat. OTHER EATING TIPS Snacks  Most sweets should be limited as snacks. They tend to be rich in calories and fats, and their caloric content outweighs their nutritional value. Some good choices in snacks are graham crackers, melba toast, soda crackers, bagels (no egg), English muffins, fruits, and vegetables. These snacks are preferable to snack crackers, [place] fries, and chips. Popcorn should be air-popped or cooked in small amounts of liquid vegetable oil. Desserts Eat fruit, low-fat yogurt, and fruit ices. AVOID pastries, cake, and cookies. Sherbet, angel food cake, gelatin dessert, frozen low-fat yogurt, or other frozen products that do not contain saturated fat (pure fruit  juice bars, frozen ice pops) are also acceptable.  COOKING METHODS Choose those methods that use little or no fat. They include: Poaching.  Braising.  Steaming.  Grilling.  Baking.  Stir-frying.  Broiling.  Microwaving.  Foods can be cooked in a nonstick pan without added fat, or use a nonfat cooking spray in regular cookware. Limit fried foods and avoid frying in saturated fat. Add moisture to  lean meats by using water, broth, cooking wines, and other nonfat or low-fat sauces along with the cooking methods mentioned above. Soups and stews should be chilled after cooking. The fat that forms on top after a few hours in the refrigerator should be skimmed off. When preparing meals, avoid using excess salt. Salt can contribute to raising blood pressure in some people. EATING AWAY FROM HOME Order entres, potatoes, and vegetables without sauces or butter. When meat exceeds the size of a deck of cards (3 to 4 ounces), the rest can be taken home for another meal. Choose vegetable or fruit salads and ask for low-calorie salad dressings to be served on the side. Use dressings sparingly. Limit high-fat toppings, such as bacon, crumbled eggs, cheese, sunflower seeds, and olives. Ask for heart-healthy tub margarine instead of butter.  Hemorrhoids Hemorrhoids are dilated (enlarged) veins around the rectum. Sometimes clots will form in the veins. This makes them swollen and painful. These are called thrombosed hemorrhoids. Causes of hemorrhoids include:  Constipation.   Straining to have a bowel movement.   HEAVY LIFTING HOME CARE INSTRUCTIONS  Eat a well balanced diet and drink 6 to 8 glasses of water every day to avoid constipation. You may also use a bulk laxative.   Avoid straining to have bowel movements.   Keep anal area dry and clean.   Do not use a donut shaped pillow or sit on the toilet for long periods. This increases blood pooling and pain.   Move your bowels when your body has the  urge; this will require less straining and will decrease pain and pressure.

## 2014-12-19 NOTE — Op Note (Signed)
[facility] [address] [place], [address]   COLONOSCOPY PROCEDURE REPORT  PATIENT: Duane Adams, Duane Adams  MR#: [id number] BIRTHDATE: [birth year] , 80  yrs. old GENDER: male ENDOSCOPIST: Danie Binder, MD REFERRED MW:[id number] McElroy, PA-C PROCEDURE DATE:  01-16-2015 PROCEDURE:   Colonoscopy with snare polypectomy, Colonoscopy with cold biopsy polypectomy, and Submucosal injection, any substance INDICATIONS:average risk patient for colon cancer. MEDICATIONS: Monitored anesthesia care  DESCRIPTION OF PROCEDURE:    Physical exam was performed.  Informed consent was obtained from the patient after explaining the benefits, risks, and alternatives to procedure.  The patient was connected to monitor and placed in left lateral position. Continuous oxygen was provided by nasal cannula and IV medicine administered through an indwelling cannula.  After administration of sedation and rectal exam, the patients rectum was intubated and the     colonoscope was advanced under direct visualization to the ileum.  The scope was removed slowly by carefully examining the color, texture, anatomy, and integrity mucosa on the way out.  The patient was recovered in endoscopy and discharged home in satisfactory condition.    COLON FINDINGS: THREE sessile polyps REMOVED VIA COLD FORCEPS. Cold forceps biopsies obtained in ONE MID-TRANSVERSE COLON POLYP.  BASE AND OPPOSITE FOLD TATTOED.  SIX 6-8 mm in size were found in the transverse colon, sigmoid colon, and rectum.  A polypectomy was performed with cold forceps  A polypectomy was performed using snare cautery.  A tattoo was applied.    BASE OF PROXIMAL TRANSVRSE COLON POLYP SITE TATTOOED. INETRNAL HEMORRHOIDS.  PREP QUALITY: excellent.  CECAL W/D TIME: 23       minutes  COMPLICATIONS: None  ENDOSCOPIC IMPRESSION: 1.  9 COLORECTAL POLPYPS REMOVED. ONE REMAINS 2. LARGE INTERNAL HEMORRHOIDS  RECOMMENDATIONS: FOLLOW A HIGH FIBER/LOW  FAT DIET. IF POSSIBLE, AVOID ITEMS THAT TRIGGER GASTRITIS. START DEXILANT. AWAIT BIOPSY RESULTS. Next colonoscopy in [place] TCS/EMR WITH MAC.    _______________________________ eSignedDanie Binder, MD January 16, 2015 3:51 PM   CPT CODES: ICD CODES:  The ICD and CPT codes recommended by this software are interpretations from the data that the clinical staff has captured with the software.  The verification of the translation of this report to the ICD and CPT codes and modifiers is the sole responsibility of the health care institution and practicing physician where this report was generated.  [employer] will not be held responsible for the validity of the ICD and CPT codes included on this report.  AMA assumes no liability for data contained or not contained herein. CPT is a Designer, television/film set of the [employer].

## 2014-12-19 NOTE — Telephone Encounter (Signed)
PT NEEDS ENDOSCOPIC MUCOSAL RESECTION OF A flat MID-TRANSVERSE COLON POLYP/PROPOFOL in 1-2 [place]. Pt has a disc with images on them.

## 2014-12-19 NOTE — Anesthesia Procedure Notes (Signed)
Procedure Name: MAC Date/Time: 12/19/2014 11:06 AM Performed by: Andree Elk, Kynlee Koenigsberg A Pre-anesthesia Checklist: Patient identified, Timeout performed, Emergency Drugs available, Suction available and Patient being monitored Patient Re-evaluated:Patient Re-evaluated prior to inductionOxygen Delivery Method: Simple face mask

## 2014-12-19 NOTE — Telephone Encounter (Signed)
Information has been faxed  

## 2014-12-19 NOTE — H&P (Signed)
Primary Care Physician:  Montey Hora Primary Gastroenterologist:  Dr. Oneida Alar  Pre-Procedure History & Physical: HPI:  Duane Adams is a 59 y.o. male here for SCREENING FOR COLON CA AND VARICES.  Past Medical History  Diagnosis Date  . Essential hypertension, benign   . Mixed hyperlipidemia   . Coronary artery disease     a. 03/2013 Cath/PCI: LM nl, LAD 90m (2.5x20 Promus Premier DES), LCX min irregs, RCA dom, 90p(3.0x20 Promus Premier DES), EF 55-65%.  . Tobacco abuse   . Skin cancer   . Hepatitis     Hepatitis C    Past Surgical History  Procedure Laterality Date  . Hand reconstruction Left   . Leg tendon surgery Right   . Left heart catheterization with coronary angiogram N/A 04/11/2013    Procedure: LEFT HEART CATHETERIZATION WITH CORONARY ANGIOGRAM;  Surgeon: Blane Ohara, MD;  Location: [facility] CATH LAB;  Service: Cardiovascular;  Laterality: N/A;  . Coronary angioplasty with stent placement  04/11/2013    LAD &  RCA     DR COOPER    Prior to Admission medications   Medication Sig Start Date End Date Taking? Authorizing Provider  albuterol (PROVENTIL HFA;VENTOLIN HFA) 108 (90 BASE) MCG/ACT inhaler Inhale 1 puff into the lungs every 6 (six) hours as needed for wheezing or shortness of breath.   Yes Historical Provider, MD  aspirin EC 81 MG tablet Take 81 mg by mouth daily.   Yes Historical Provider, MD  B Complex-Folic Acid (B COMPLEX-VITAMIN B12) TABS Take 1 tablet by mouth daily.   Yes Historical Provider, MD  Choline Fenofibrate 135 MG capsule Take 135 mg by mouth daily.   Yes Historical Provider, MD  cloNIDine (CATAPRES - DOSED IN MG/24 HR) 0.1 mg/24hr patch Place 0.1 mg onto the skin once a week. Changes on Saturday.   Yes Historical Provider, MD  clopidogrel (PLAVIX) 75 MG tablet Take 1 tablet (75 mg total) by mouth daily with breakfast. 08/05/13  Yes Arnoldo Lenis, MD  hydrochlorothiazide (HYDRODIURIL) 25 MG tablet Take 25 mg by mouth daily.   Yes  Historical Provider, MD  metoprolol succinate (TOPROL-XL) 25 MG 24 hr tablet Take 25 mg by mouth daily.   Yes Historical Provider, MD  Multiple Vitamin (MULTIVITAMIN) tablet Take 1 tablet by mouth daily.   Yes Historical Provider, MD  Na Sulfate-K Sulfate-Mg Sulf SOLN Take 1 Container by mouth once. 11/30/14 12/30/14 Yes Orvil Feil, NP  Omega-3 Fatty Acids (FISH OIL) 1000 MG CAPS Take 2,400 mg by mouth.   Yes Historical Provider, MD  Umeclidinium-Vilanterol 62.5-25 MCG/INH AEPB Inhale 1 puff into the lungs daily.    Yes Historical Provider, MD  metoprolol tartrate (LOPRESSOR) 25 MG tablet Take 1 tablet (25 mg total) by mouth 2 (two) times daily. Patient not taking: Reported on 12/05/2014 10/30/14   Lendon Colonel, NP  nitroGLYCERIN (NITROSTAT) 0.4 MG SL tablet Place 1 tablet (0.4 mg total) under the tongue every 5 (five) minutes as needed for chest pain. 04/12/13   Rogelia Mire, NP    Allergies as of 11/30/2014 - Review Complete 10/13/2014  Allergen Reaction Noted  . Lisinopril Swelling 08/18/2012  . Neosporin [neomycin-bacitracin zn-polymyx]  10/28/2013    Family History  Problem Relation Age of Onset  . Colon cancer Neg Hx     History   Social History  . Marital Status: Single    Spouse Name: N/A  . Number of Children: N/A  . Years of  Education: N/A   Occupational History  . Not on file.   Social History Main Topics  . Smoking status: Current Every Day Smoker -- 0.50 packs/day for 40 years    Types: Cigarettes    Start date: 10/10/1971  . Smokeless tobacco: Never Used     Comment: trying to wean down, now on 6-7 cigarettes a day  . Alcohol Use: 0.0 oz/week    0 Standard drinks or equivalent per week     Comment: a few beers once a month   . Drug Use: No  . Sexual Activity: Not on file   Other Topics Concern  . Not on file   Social History Narrative    Review of Systems: See HPI, otherwise negative ROS   Physical Exam: BP 120/81 mmHg  Pulse 84   Temp(Src) 98.2 F (36.8 C) (Oral)  Resp 18  Ht 5\' 9"  (1.753 m)  Wt 181 lb (82.101 kg)  BMI 26.72 kg/m2  SpO2 97% General:   Alert,  pleasant and cooperative in NAD Head:  Normocephalic and atraumatic. Neck:  Supple; Lungs:  Clear throughout to auscultation.    Heart:  Regular rate and rhythm. Abdomen:  Soft, nontender and nondistended. Normal bowel sounds, without guarding, and without rebound.   Neurologic:  Alert and  oriented x4;  grossly normal neurologically.  Impression/Plan:   SCREENING FOR COLON CA AND VARICES  PLAN:  1.EGD/TCS TODAY

## 2014-12-19 NOTE — Transfer of Care (Signed)
Immediate Anesthesia Transfer of Care Note  Patient: Duane Adams  Procedure(s) Performed: Procedure(s) with comments: COLONOSCOPY WITH PROPOFOL (N/A) - In cecum @ 1131, out @ 11:54 ESOPHAGOGASTRODUODENOSCOPY (EGD) WITH PROPOFOL (N/A) POLYPECTOMY (N/A) BIOPSY (N/A)  Patient Location: PACU  Anesthesia Type:MAC  Level of Consciousness: awake, alert , oriented and patient cooperative  Airway & Oxygen Therapy: Patient Spontanous Breathing and Patient connected to nasal cannula oxygen  Post-op Assessment: Report given to RN and Post -op Vital signs reviewed and stable  Post vital signs: Reviewed and stable  Last Vitals:  Filed Vitals:   12/19/14 1100  BP: 120/81  Pulse:   Temp:   Resp: 13    Complications: No apparent anesthesia complications

## 2014-12-20 ENCOUNTER — Encounter (HOSPITAL_COMMUNITY): Payer: Self-pay | Admitting: Gastroenterology

## 2014-12-22 ENCOUNTER — Other Ambulatory Visit: Payer: Self-pay | Admitting: Gastroenterology

## 2014-12-22 MED ORDER — LEDIPASVIR-SOFOSBUVIR 90-400 MG PO TABS: 1.0000 | ORAL_TABLET | Freq: Every day | ORAL | Status: DC

## 2014-12-22 NOTE — Progress Notes (Signed)
Quick Note:  Metavir score F2, F3.  Will submit for Harvoni X 12 weeks.  Looks like he was started on Dexilant. Needs to take Prilosec 20 mg, Nexium 20 mg, or something of that nature while taking Harvoni. Have shipped to [place] if possible. Will need follow-up appt 4 weeks after starting. I sent Harvoni to [place]. ______

## 2014-12-25 ENCOUNTER — Telehealth: Payer: Self-pay

## 2014-12-25 MED ORDER — PANTOPRAZOLE SODIUM 40 MG PO TBEC: 40.0000 mg | DELAYED_RELEASE_TABLET | Freq: Every day | ORAL | Status: DC

## 2014-12-25 NOTE — Progress Notes (Signed)
Quick Note:  Pt is aware the Harvoni will be shipped here. He is not taking Dexilant. Said he has not had heartburn or indigestion and he doesn't know why he would need Nexium or Protonix.  He has lots of questions and would like to discuss with Duane Adams when she returns from vacation. I have scheduled him an OV appt with Anna on 01/11/2015 at 9:00 AM. ______

## 2014-12-25 NOTE — Telephone Encounter (Signed)
Noted. I will call in a protonix Rx for 1 month. Please notify the patient and have him call us and notify of how he responds to Protonix.

## 2014-12-25 NOTE — Progress Notes (Signed)
Quick Note:  He was started on Dexilant because he had gastritis on EGD by [facility] last week.  Wait until after OV with Vicente Males on 01/11/15 before starting Harvoni but please continue approval process of Harvoni. ______

## 2014-12-25 NOTE — Telephone Encounter (Signed)
Patient needs to try Protonix and document success vs failure before insurance will cover [place].

## 2014-12-25 NOTE — Telephone Encounter (Signed)
Received a fax from the [name] is not covered with his insurance. Pt has [place] medicaid. He has tried and failed omeprazole and pepcid. He will need to also try and fail pantoprazole prior to getting dexilant covered.

## 2014-12-26 NOTE — Telephone Encounter (Signed)
Pantoprazole was sent to the wrong pharmacy. It was sent to [facility]. It needs to be sent to his local pharmacy, but I checked pts bx report and he has hpylori and pantoprazole will have to be on hold anyway during treatment if you use prevpac because Pt has [place] medicaid and they have prevpac and pylera on their formulary. They do not cover if the rx's are sent in separately. What would you like to do for this pt.

## 2014-12-28 ENCOUNTER — Ambulatory Visit (HOSPITAL_COMMUNITY)
Admission: RE | Admit: 2014-12-28 | Discharge: 2014-12-28 | Disposition: A | Discharge status: Home / Self Care | Payer: Medicaid Other | Source: Ambulatory Visit | Attending: Pulmonary Disease | Admitting: Pulmonary Disease

## 2014-12-28 ENCOUNTER — Other Ambulatory Visit (HOSPITAL_COMMUNITY): Payer: Self-pay | Admitting: Pulmonary Disease

## 2014-12-28 DIAGNOSIS — I10 Essential (primary) hypertension: Secondary | ICD-10-CM | POA: Diagnosis not present

## 2014-12-28 DIAGNOSIS — R0602 Shortness of breath: Secondary | ICD-10-CM | POA: Diagnosis not present

## 2015-01-01 ENCOUNTER — Encounter: Payer: Self-pay | Admitting: Physician Assistant

## 2015-01-01 ENCOUNTER — Encounter: Payer: Self-pay | Admitting: Gastroenterology

## 2015-01-01 MED ORDER — AMOXICILL-CLARITHRO-LANSOPRAZ PO MISC: Freq: Two times a day (BID) | ORAL | Status: DC

## 2015-01-01 NOTE — Telephone Encounter (Addendum)
Please call pt. HE had SIX simple adenomas AND 3 HYPERPLASTIC POLYPS removed.  Next colonoscopy in [place]. TAKE YOUR CD WITH YOU TO YOUR VISIT.

## 2015-01-01 NOTE — Telephone Encounter (Signed)
NEXT TCS IN 1-3 TEARS AT [facility].

## 2015-01-01 NOTE — Telephone Encounter (Signed)
Pt is aware of the H Pylori and he will pick up the [place]. He is aware not to take Protonix at this time. Keep appt with Laban Emperor, NP on 01/11/2015 and discuss the Harvoni, etc.

## 2015-01-01 NOTE — Telephone Encounter (Signed)
REVIEWED. AGREE. NO ADDITIONAL RECOMMENDATIONS. 

## 2015-01-01 NOTE — Telephone Encounter (Signed)
OV made for 9/20 at 130 with AS and appt letter mailed

## 2015-01-01 NOTE — Telephone Encounter (Addendum)
PLEASE CALL PT. HIS GASTRIC BIOPSIES SHOW H PYLORI GASTRITIS.  He needs the PREVPAC BID FOR 14 DAYS, AND THEN WE WILL SEND HIS RX FOR PROTONIX ONCE HIS ABX AND HARVONI ARE COMPLETE. Med side effects include NVD, abd pain, and metallic taste. HE SHOULD COMPLETE ABX FOR H PYLORI INFECTION PRIOR TO STARTING HARVONI.    FOLLOW A HIGH FIBER/LOW FAT DIET. AVOID ITEMS THAT CAUSE BLOATING.   AVOID ITEMS THAT TRIGGER GASTRITIS.   Next colonoscopy in JUL 2016 [place]. TAKE YOUR CD WITH YOU TO YOUR VISIT.  OPV IN 3 MOS E:30 H PYLORI GASTRITIS, LARGE COLON POLYP.

## 2015-01-01 NOTE — Telephone Encounter (Signed)
REMINDER IN EPIC °

## 2015-01-01 NOTE — Addendum Note (Signed)
Addended by: Danie Binder on: 01/01/2015 09:11 AM   Modules accepted: Orders

## 2015-01-01 NOTE — Telephone Encounter (Signed)
Referral has been made by Ginger

## 2015-01-02 NOTE — Telephone Encounter (Signed)
Pt is aware referral has been made to [facility], and also he will have next colonoscopy at [facility] 1-3 years.

## 2015-01-04 NOTE — Telephone Encounter (Signed)
Pt is scheduled for endoscopic mucosal resection on 02/02/15 @ 9:00 am at [facility]

## 2015-01-11 ENCOUNTER — Ambulatory Visit (INDEPENDENT_AMBULATORY_CARE_PROVIDER_SITE_OTHER): Payer: Medicaid Other | Admitting: Gastroenterology

## 2015-01-11 ENCOUNTER — Encounter: Payer: Self-pay | Admitting: Gastroenterology

## 2015-01-11 VITALS — BP 137/81 | HR 98 | Temp 97.6°F | Ht 69.0 in | Wt 182.6 lb

## 2015-01-11 DIAGNOSIS — K635 Polyp of colon: Secondary | ICD-10-CM

## 2015-01-11 DIAGNOSIS — B9681 Helicobacter pylori [H. pylori] as the cause of diseases classified elsewhere: Secondary | ICD-10-CM | POA: Diagnosis not present

## 2015-01-11 DIAGNOSIS — B182 Chronic viral hepatitis C: Secondary | ICD-10-CM

## 2015-01-11 DIAGNOSIS — K2970 Gastritis, unspecified, without bleeding: Secondary | ICD-10-CM

## 2015-01-11 NOTE — Patient Instructions (Signed)
Due to insurance request, we have to update your blood work for Hepatitis C. We will do this and then you should be able to get the medication.  Continue prevpac. When this is completed, we can start [place].

## 2015-01-11 NOTE — Progress Notes (Signed)
Referring Provider: Soyla Dryer, PA-C Primary Care Physician:  Montey Hora  Primary GI: Dr. Oneida Alar   Chief Complaint  Patient presents with  . Follow-up    HPI:   Duane Adams is a 59 y.o. male presenting today with a history of chronic Hep C, treatment naive with genotype 1b. Elastography with echogenic liver noting F2 and F3 score.  presenting in follow-up after procedures.   Recent colonoscopy with six simple adenomas and 3 hyperplastic polyps. Had flat mid-transverse colon polyp and referred to [facility] for endoscopic mucosal resection, which is scheduled for July 22. EGD at that time without varices, small hiatal hernia noted, moderate non-erosive gastritis and mild duodenitis.  POSITIVE H.PYLORI. Prescribed Prevpac.   One more week of Prevpac to complete. Feels fatigued. Chronic left-sided chest discomfort "right under breast". Sometimes aches. Present for 20 years. Always there. Waxes and wanes.   Past Medical History  Diagnosis Date  . Essential hypertension, benign   . Mixed hyperlipidemia   . Coronary artery disease     a. 03/2013 Cath/PCI: LM nl, LAD 25m(2.5x20 Promus Premier DES), LCX min irregs, RCA dom, 90p(3.0x20 Promus Premier DES), EF 55-65%.  . Tobacco abuse   . Skin cancer   . Hepatitis     Hepatitis C  . Helicobacter pylori gastritis JUN 2016 EGD/Bx    PREVPAK BID FOR 14 DAYS    Past Surgical History  Procedure Laterality Date  . Hand reconstruction Left   . Leg tendon surgery Right   . Left heart catheterization with coronary angiogram N/A 04/11/2013    Procedure: LEFT HEART CATHETERIZATION WITH CORONARY ANGIOGRAM;  Surgeon: MBlane Ohara MD;  [facility];  Service: Cardiovascular;  Laterality: N/A;  . 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 Colonoscopy with propofol N/A 12/19/2014    Dr. FOneida Alar six simple adenomas and 3 hyperplastic polyps. [facility] flat mid-transverse  colon polyp [facility]  . Esophagogastroduodenoscopy (egd) with propofol N/A 12/19/2014    Dr. FOneida Alar  without varices, small hiatal hernia noted, moderate non-erosive gastritis and mild duodenitis.  POSITIVE H.PYLORI. Prescribed Prevpac.  . Polypectomy N/A 12/19/2014    Procedure: POLYPECTOMY;  Surgeon: SDanie Binder MD;  Location: [facility];  Service: Endoscopy;  Laterality: N/A;  . Esophageal biopsy N/A 12/19/2014    Procedure: BIOPSY;  Surgeon: SDanie Binder MD;  Location: [facility]S;  Service: Endoscopy;  Laterality: N/A;    Current Outpatient Prescriptions  Medication Sig Dispense Refill  . albuterol (PROVENTIL HFA;VENTOLIN HFA) 108 (90 BASE) MCG/ACT inhaler Inhale 1 puff into the lungs every 6 (six) hours as needed for wheezing or shortness of breath.    .Marland Kitchenamoxicillin-clarithromycin-lansoprazole (PREVPAC) combo pack Take by mouth 2 (two) times daily. Follow package directions. TAKE FOR 14 DAYS. 1 kit 0  . aspirin EC 81 MG tablet Take 81 mg by mouth daily.    . B Complex-Folic Acid (B COMPLEX-VITAMIN B12) TABS Take 1 tablet by mouth daily.    . Choline Fenofibrate 135 MG capsule Take 135 mg by mouth daily.    . cloNIDine (CATAPRES - DOSED IN MG/24 HR) 0.1 mg/24hr patch Place 0.1 mg onto the skin once a week. Changes on Saturday.    . clopidogrel (PLAVIX) 75 MG tablet Take 1 tablet (75 mg total) by  mouth daily with breakfast. 30 tablet 6  . hydrochlorothiazide (HYDRODIURIL) 25 MG tablet Take 25 mg by mouth daily.    . metoprolol succinate (TOPROL-XL) 25 MG 24 hr tablet Take 25 mg by mouth daily.    . Multiple Vitamin (MULTIVITAMIN) tablet Take 1 tablet by mouth daily.    . nitroGLYCERIN (NITROSTAT) 0.4 MG SL tablet Place 1 tablet (0.4 mg total) under the tongue every 5 (five) minutes as needed for chest pain. 25 tablet 3  . Omega-3 Fatty Acids (FISH OIL) 1000 MG CAPS Take 2,400 mg by mouth.    . Umeclidinium-Vilanterol  62.5-25 MCG/INH AEPB Inhale 1 puff into the lungs daily.      No current facility-administered medications for this visit.    Allergies as of 01/11/2015 - Review Complete 01/11/2015  Allergen Reaction Noted  . Lisinopril Swelling 08/18/2012  . Neosporin [neomycin-bacitracin zn-polymyx]  10/28/2013    Family History  Problem Relation Age of Onset  . Colon cancer Neg Hx     History   Social History  . Marital Status: Single    Spouse Name: N/A  . Number of Children: N/A  . Years of Education: N/A   Social History Main Topics  . Smoking status: Current Every Day Smoker -- 0.50 packs/day for 40 years    Types: Cigarettes    Start date: 10/10/1971  . Smokeless tobacco: Never Used     Comment: trying to wean down, now on 6-7 cigarettes a day  . Alcohol Use: 0.0 oz/week    0 Standard drinks or equivalent per week     Comment: a few beers once a month   . Drug Use: No  . Sexual Activity: Not on file   Other Topics Concern  . None   Social History Narrative    Review of Systems: As notified in HPI  Physical Exam: BP 137/81 mmHg  Pulse 98  Temp(Src) 97.6 F (36.4 C) (Oral)  Ht '5\' 9"'  (1.753 m)  Wt 182 lb 9.6 oz (82.827 kg)  BMI 26.95 kg/m2 General:   Alert and oriented. No distress noted. Pleasant and cooperative.  Head:  Normocephalic and atraumatic. Eyes:  Conjuctiva clear without scleral icterus. Mouth:  Oral mucosa pink and moist. Good dentition. No lesions. Abdomen:  +BS, soft, non-tender and non-distended. No rebound or guarding. LIver margin palpable below right costal margin.  Msk:  Symmetrical without gross deformities. Normal posture. Extremities:  Without edema. Neurologic:  Alert and  oriented x4;  grossly normal neurologically. Psych:  Alert and cooperative. Normal mood and affect.

## 2015-01-13 LAB — COMPLETE METABOLIC PANEL WITH GFR
ALT: 130 U/L — AB (ref 0–53)
AST: 153 U/L — AB (ref 0–37)
Albumin: 3.8 g/dL (ref 3.5–5.2)
Alkaline Phosphatase: 56 U/L (ref 39–117)
BILIRUBIN TOTAL: 0.6 mg/dL (ref 0.2–1.2)
BUN: 13 mg/dL (ref 6–23)
CALCIUM: 8.9 mg/dL (ref 8.4–10.5)
CO2: 25 mEq/L (ref 19–32)
CREATININE: 1.03 mg/dL (ref 0.50–1.35)
Chloride: 103 mEq/L (ref 96–112)
GFR, Est African American: >89 mL/min
GFR, Est Non African American: 80 mL/min
GLUCOSE: 103 mg/dL — AB (ref 70–99)
Potassium: 3.8 mEq/L (ref 3.5–5.3)
SODIUM: 142 meq/L (ref 135–145)
TOTAL PROTEIN: 6.8 g/dL (ref 6.0–8.3)

## 2015-01-13 LAB — CBC
HCT: 46 % (ref 39.0–52.0)
Hemoglobin: 16 g/dL (ref 13.0–17.0)
MCH: 31.7 pg (ref 26.0–34.0)
MCHC: 34.8 g/dL (ref 30.0–36.0)
MCV: 91.3 fL (ref 78.0–100.0)
MPV: 10.5 fL (ref 8.6–12.4)
Platelets: 176 10*3/uL (ref 150–400)
RBC: 5.04 MIL/uL (ref 4.22–5.81)
RDW: 15 % (ref 11.5–15.5)
WBC: 6.6 10*3/uL (ref 4.0–10.5)

## 2015-01-13 LAB — PROTIME-INR
INR: 0.99 (ref <1.50)
Prothrombin Time: 13.1 seconds (ref 11.6–15.2)

## 2015-01-14 ENCOUNTER — Encounter: Payer: Self-pay | Admitting: Gastroenterology

## 2015-01-14 DIAGNOSIS — K2970 Gastritis, unspecified, without bleeding: Secondary | ICD-10-CM

## 2015-01-14 DIAGNOSIS — K635 Polyp of colon: Secondary | ICD-10-CM | POA: Insufficient documentation

## 2015-01-14 DIAGNOSIS — B9681 Helicobacter pylori [H. pylori] as the cause of diseases classified elsewhere: Secondary | ICD-10-CM | POA: Insufficient documentation

## 2015-01-14 NOTE — Assessment & Plan Note (Signed)
Multiple adenomas. Flat mid-transverse colon polyp with referral to [facility] for endoscopic mucosal resection scheduled for July 22.

## 2015-01-14 NOTE — Assessment & Plan Note (Signed)
Complete Prevpac. Will be finished in 1 week. Need to check H.pylori stool antigen or urea breath test for eradication approximately 4 weeks after completion of therapy.

## 2015-01-14 NOTE — Assessment & Plan Note (Signed)
Chronic Hep C genotype 1 b, Metavir score F2 and F3. Needs Harvoni X 12 weeks. Treatment-naive. Per insurance guidelines, needs updated RNA. Will order this now. Complete Prevpac additional week then start Harvoni. Avoid PPI during treatment and resume if needed after Harvoni treatment.

## 2015-01-16 LAB — HCV RNA QUANT RFLX ULTRA OR GENOTYP
HCV QUANT LOG: 6.88 {Log} — AB (ref <1.18)
HCV Quantitative: 7649050 IU/mL — ABNORMAL HIGH (ref <15)

## 2015-01-16 NOTE — Progress Notes (Signed)
cc'ed to pcp °

## 2015-01-18 LAB — HEPATITIS C GENOTYPE

## 2015-01-24 NOTE — Progress Notes (Signed)
Quick Note:  HCV RNA updated per insurance requirements. Needs Harvoni X 12 weeks. Needs to pick up AFTER Prevpac. Will not need PPI long-term. No PPI during Harvoni therapy. Do I need to resend? ______

## 2015-01-25 ENCOUNTER — Telehealth: Payer: Self-pay | Admitting: Gastroenterology

## 2015-01-25 NOTE — Progress Notes (Signed)
Quick Note:  PT is aware and has completed the Prevpac. ______

## 2015-01-25 NOTE — Progress Notes (Signed)
Quick Note:  Pt's wife will have him call. ______

## 2015-01-25 NOTE — Telephone Encounter (Signed)
Pt was returning a call to DS. Please call him at [phone]

## 2015-01-26 NOTE — Telephone Encounter (Signed)
I spoke to pt yesterday and informed him of his results. See result note.

## 2015-01-31 ENCOUNTER — Telehealth: Payer: Self-pay

## 2015-01-31 NOTE — Telephone Encounter (Signed)
May pick up [place]. Important instructions:  1. Take Harvoni 1 capsule each day, same time of day, for 12 weeks.  2. Do not take any reflux medications while you are on this. 3. Do not take any over the counter supplements or herbs while taking Harvoni. 4. You will need an appointment 4 weeks after starting Harvoni, where we will draw lab work.

## 2015-01-31 NOTE — Telephone Encounter (Signed)
APPOINTMENT MADE °

## 2015-01-31 NOTE — Telephone Encounter (Signed)
Pt is aware. Letter with instructions is at the front desk.  Please schedule ov.

## 2015-01-31 NOTE — Telephone Encounter (Signed)
pts harvoni has arrived. I need to know what directions to give him. (what medications to stop, when to have labs done and when to follow up) please.

## 2015-03-13 ENCOUNTER — Encounter: Payer: Self-pay | Admitting: Gastroenterology

## 2015-03-13 ENCOUNTER — Ambulatory Visit (INDEPENDENT_AMBULATORY_CARE_PROVIDER_SITE_OTHER): Payer: Medicaid Other | Admitting: Gastroenterology

## 2015-03-13 VITALS — BP 155/88 | HR 101 | Temp 97.8°F | Ht 69.0 in | Wt 187.6 lb

## 2015-03-13 DIAGNOSIS — B182 Chronic viral hepatitis C: Secondary | ICD-10-CM

## 2015-03-13 DIAGNOSIS — K2970 Gastritis, unspecified, without bleeding: Secondary | ICD-10-CM

## 2015-03-13 DIAGNOSIS — B9681 Helicobacter pylori [H. pylori] as the cause of diseases classified elsewhere: Secondary | ICD-10-CM

## 2015-03-13 DIAGNOSIS — K635 Polyp of colon: Secondary | ICD-10-CM | POA: Diagnosis not present

## 2015-03-13 LAB — CBC
HCT: 43.1 % (ref 39.0–52.0)
HEMOGLOBIN: 15.4 g/dL (ref 13.0–17.0)
MCH: 32.2 pg (ref 26.0–34.0)
MCHC: 35.7 g/dL (ref 30.0–36.0)
MCV: 90 fL (ref 78.0–100.0)
MPV: 10.2 fL (ref 8.6–12.4)
PLATELETS: 201 10*3/uL (ref 150–400)
RBC: 4.79 MIL/uL (ref 4.22–5.81)
RDW: 14.2 % (ref 11.5–15.5)
WBC: 7.2 10*3/uL (ref 4.0–10.5)

## 2015-03-13 LAB — COMPLETE METABOLIC PANEL WITH GFR
ALT: 26 U/L (ref 9–46)
AST: 24 U/L (ref 10–35)
Albumin: 4 g/dL (ref 3.6–5.1)
Alkaline Phosphatase: 43 U/L (ref 40–115)
BUN: 12 mg/dL (ref 7–25)
CALCIUM: 9.5 mg/dL (ref 8.6–10.3)
CO2: 23 mmol/L (ref 20–31)
CREATININE: 0.91 mg/dL (ref 0.70–1.33)
Chloride: 107 mmol/L (ref 98–110)
GFR, Est Non African American: >89 mL/min (ref >=60)
Glucose, Bld: 93 mg/dL (ref 65–99)
Potassium: 3.7 mmol/L (ref 3.5–5.3)
Sodium: 139 mmol/L (ref 135–146)
TOTAL PROTEIN: 7.2 g/dL (ref 6.1–8.1)
Total Bilirubin: 0.4 mg/dL (ref 0.2–1.2)

## 2015-03-13 LAB — PROTIME-INR
INR: 0.93 (ref <1.50)
PROTHROMBIN TIME: 12.5 s (ref 11.6–15.2)

## 2015-03-13 NOTE — Progress Notes (Signed)
Referring Provider: Soyla Dryer, PA-C Primary Care Physician:  Montey Hora Primary GI: Dr. Oneida Alar   Chief Complaint  Patient presents with  . Follow-up    HPI:   Duane Adams is a 59 y.o. male presenting today with a history of chronic Hep C, fibrosis score of F2 and F3. Here for routine follow-up after starting Harvoni.    Takes Harvoni each evening at same time. About 5 weeks into it. After going to bed not sleeping well. Wakes up at 2, 4, 7am. When getting up has slight abdominal discomfort but not painful. No headaches. States not enough to complain about. No N/V. Has a pain in left-sided chest discomfort chronically/constant. Feels like someone is just poking him. Followed by cardiology. No typical reflux symptoms. Recent endoscopic mucosal resection at [facility] with tubular adenoma and focal high grade dysplasia. Needs surveillance for high grade adenoma in 1 year. Needs documentation of eradication for H.pylori.   Past Medical History  Diagnosis Date  . Essential hypertension, benign   . Mixed hyperlipidemia   . Coronary artery disease     a. 03/2013 Cath/PCI: LM nl, LAD 102m (2.5x20 Promus Premier DES), LCX min irregs, RCA dom, 90p(3.0x20 Promus Premier DES), EF 55-65%.  . Tobacco abuse   . Skin cancer   . Hepatitis     Hepatitis C  . Helicobacter pylori gastritis JUN 2016 EGD/Bx    PREVPAK BID FOR 14 DAYS    Past Surgical History  Procedure Laterality Date  . Hand reconstruction Left   . Leg tendon surgery Right   . Left heart catheterization with coronary angiogram N/A 04/11/2013    Procedure: LEFT HEART CATHETERIZATION WITH CORONARY ANGIOGRAM;  Surgeon: Blane Ohara, MD;  Location: [facility] CATH LAB;  Service: Cardiovascular;  Laterality: N/A;  . 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 Colonoscopy with propofol N/A 12/19/2014    Dr. Oneida Alar: six simple adenomas and 3 hyperplastic polyps. Had flat  mid-transverse colon polyp and referred to [facility] for endoscopic mucosal resection, which is scheduled for July 22  . Esophagogastroduodenoscopy (egd) with propofol N/A 12/19/2014    Dr. Oneida Alar:  without varices, small hiatal hernia noted, moderate non-erosive gastritis and mild duodenitis.  POSITIVE H.PYLORI. Prescribed Prevpac.  . Polypectomy N/A 12/19/2014    Procedure: POLYPECTOMY;  Surgeon: Danie Binder, MD;  Location: [facility]S;  Service: Endoscopy;  Laterality: N/A;  . Esophageal biopsy N/A 12/19/2014    Procedure: BIOPSY;  Surgeon: Danie Binder, MD;  Location: [facility];  Service: Endoscopy;  Laterality: N/A;  . Colonoscopy  June 2016    [facility]: with endoscopic mucosal resection. 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 Needs colonoscopy in 1 year.     Current Outpatient Prescriptions  Medication Sig Dispense Refill  . albuterol (PROVENTIL HFA;VENTOLIN HFA) 108 (90 BASE) MCG/ACT inhaler Inhale 1 puff into the lungs every 6 (six) hours as needed for wheezing or shortness of breath.    Marland Kitchen aspirin EC 81 MG tablet Take 81 mg by mouth daily.    . B Complex-Folic Acid (B COMPLEX-VITAMIN B12) TABS Take 1 tablet by mouth daily.    . Choline Fenofibrate 135 MG capsule Take 135 mg by mouth daily.    . cloNIDine (CATAPRES - DOSED IN MG/24 HR) 0.1 mg/24hr patch Place 0.1 mg onto the skin once a week. Changes on Saturday.    Marland Kitchen  clopidogrel (PLAVIX) 75 MG tablet Take 1 tablet (75 mg total) by mouth daily with breakfast. 30 tablet 6  . hydrochlorothiazide (HYDRODIURIL) 25 MG tablet Take 25 mg by mouth daily.    . Ledipasvir-Sofosbuvir (HARVONI) 90-400 MG TABS Take by mouth.    . metoprolol succinate (TOPROL-XL) 25 MG 24 hr tablet Take 25 mg by mouth daily.    . Multiple Vitamin (MULTIVITAMIN) tablet Take 1 tablet by mouth daily.    . Omega-3 Fatty Acids (FISH OIL) 1000 MG CAPS Take 2,400 mg by mouth.    . nitroGLYCERIN (NITROSTAT) 0.4 MG SL tablet Place 1 tablet (0.4 mg total) under the  tongue every 5 (five) minutes as needed for chest pain. (Patient not taking: Reported on 03/13/2015) 25 tablet 3  . Umeclidinium-Vilanterol 62.5-25 MCG/INH AEPB Inhale 1 puff into the lungs daily.      No current facility-administered medications for this visit.    Allergies as of 03/13/2015 - Review Complete 03/13/2015  Allergen Reaction Noted  . Lisinopril Swelling 08/18/2012  . Neosporin [neomycin-bacitracin zn-polymyx]  10/28/2013    Family History  Problem Relation Age of Onset  . Colon cancer Neg Hx     Social History   Social History  . Marital Status: Single    Spouse Name: N/A  . Number of Children: N/A  . Years of Education: N/A   Social History Main Topics  . Smoking status: Current Every Day Smoker -- 0.50 packs/day for 40 years    Types: Cigarettes    Start date: 10/10/1971  . Smokeless tobacco: Never Used     Comment: trying to wean down, now on 6-7 cigarettes a day  . Alcohol Use: 0.0 oz/week    0 Standard drinks or equivalent per week     Comment: a few beers once a month   . Drug Use: No  . Sexual Activity: Not Asked   Other Topics Concern  . None   Social History Narrative    Review of Systems: Negative unless mentioned in HPI.   Physical Exam: BP 155/88 mmHg  Pulse 101  Temp(Src) 97.8 F (36.6 C)  Ht 5\' 9"  (1.753 m)  Wt 187 lb 9.6 oz (85.095 kg)  BMI 27.69 kg/m2 General:   Alert and oriented. No distress noted. Pleasant and cooperative.  Head:  Normocephalic and atraumatic. Eyes:  Conjuctiva clear without scleral icterus. Mouth:  Oral mucosa pink and moist. Good dentition. No lesions. Abdomen:  +BS, soft, non-tender and non-distended. No rebound or guarding. Liver margin palpable below right costal margin.  Msk:  Symmetrical without gross deformities. Normal posture. Extremities:  Without edema. Neurologic:  Alert and  oriented x4;  grossly normal neurologically. Psych:  Alert and cooperative. Normal mood and affect.

## 2015-03-13 NOTE — Patient Instructions (Signed)
Continue to take Harvoni the same time every day. Do not skip doses.  Please have blood work done today. We will call with the results.  Your next ultrasound will be due in Dec 2016.   Your next colonoscopy will be in the summer 2017.   We will see you back in 2 months!

## 2015-03-14 LAB — HEPATITIS C RNA QUANTITATIVE: HCV QUANT: NOT DETECTED [IU]/mL (ref <15)

## 2015-03-19 ENCOUNTER — Encounter: Payer: Self-pay | Admitting: Gastroenterology

## 2015-03-19 NOTE — Assessment & Plan Note (Signed)
Doing well with Harvoni, s/p approximately 5 weeks. Needs routine labs now. Next ultrasound in Dec 2016. Return in 2 months.

## 2015-03-19 NOTE — Assessment & Plan Note (Signed)
Check urea breath test at next visit.

## 2015-03-19 NOTE — Assessment & Plan Note (Signed)
Next surveillance in June 2017 due to multiple adenomas and recent endoscopic mucosal resection of adenoma shoing focal high grade dysplasia.

## 2015-03-20 NOTE — Progress Notes (Signed)
CC'ED TO PCP 

## 2015-03-22 NOTE — Progress Notes (Signed)
Quick Note:  Viral load undetectable! This is awesome. HOWEVER HE NEEDS TO KEEP TAKING HARVONI. We will check viral load again 3 months after treatment. Keep follow-up. All labs look great. ______

## 2015-03-22 NOTE — Progress Notes (Signed)
Quick Note:  Pt is aware. ______ 

## 2015-04-03 ENCOUNTER — Ambulatory Visit: Payer: Medicaid Other | Admitting: Gastroenterology

## 2015-05-15 ENCOUNTER — Ambulatory Visit (INDEPENDENT_AMBULATORY_CARE_PROVIDER_SITE_OTHER): Payer: Medicaid Other | Admitting: Gastroenterology

## 2015-05-15 ENCOUNTER — Encounter: Payer: Self-pay | Admitting: Gastroenterology

## 2015-05-15 VITALS — BP 143/80 | HR 95 | Temp 97.4°F | Ht 69.0 in | Wt 189.4 lb

## 2015-05-15 DIAGNOSIS — K635 Polyp of colon: Secondary | ICD-10-CM | POA: Diagnosis not present

## 2015-05-15 DIAGNOSIS — B182 Chronic viral hepatitis C: Secondary | ICD-10-CM | POA: Diagnosis not present

## 2015-05-15 DIAGNOSIS — B9681 Helicobacter pylori [H. pylori] as the cause of diseases classified elsewhere: Secondary | ICD-10-CM | POA: Diagnosis not present

## 2015-05-15 DIAGNOSIS — K2970 Gastritis, unspecified, without bleeding: Secondary | ICD-10-CM

## 2015-05-15 NOTE — Assessment & Plan Note (Signed)
Urea breath test for eradication.

## 2015-05-15 NOTE — Patient Instructions (Signed)
Please complete the breath test now.   We will see you back in January for an office visit, labs, and an ultrasound.

## 2015-05-15 NOTE — Assessment & Plan Note (Signed)
Completed course of Harvoni. Needs repeat labs (CBC, HFP, BMP, INR, Hep C RNA) in Jan 2017. Will have him return at that time and obtain an elastography as well.

## 2015-05-15 NOTE — Progress Notes (Signed)
Referring Provider: Soyla Dryer, PA-C Primary Care Physician:  Soyla Dryer, PA-C  Primary GI: Dr. Oneida Alar   Chief Complaint  Patient presents with  . Follow-up    HPI:   Duane Adams is a 59 y.o. male presenting today with a history of chronic Hep C, fibrosis score of F2 and F3. Here for routine follow-up after completion of Harvoni. 4 weeks into treatment, RNA undetectable. Will need labs in Jan 2017 to document SVR.    Main complaint is fatigue. [facility] Harvoni completed about 2 weeks ago. Due for elastography in Dec/Jan. 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 Needs surveillance for high grade adenoma in June 2017. Needs documentation of eradication for H.pylori.. Need to check urea breath test  Past Medical History  Diagnosis Date  . Essential hypertension, benign   . Mixed hyperlipidemia   . Coronary artery disease     a. 03/2013 Cath/PCI: LM nl, LAD 66m (2.5x20 Promus Premier DES), LCX min irregs, RCA dom, 90p(3.0x20 Promus Premier DES), EF 55-65%.  . Tobacco abuse   . Skin cancer   . Hepatitis     Hepatitis C  . Helicobacter pylori gastritis JUN 2016 EGD/Bx    PREVPAK BID FOR 14 DAYS    Past Surgical History  Procedure Laterality Date  . Hand reconstruction Left   . Leg tendon surgery Right   . Left heart catheterization with coronary angiogram N/A 04/11/2013    Procedure: LEFT HEART CATHETERIZATION WITH CORONARY ANGIOGRAM;  Surgeon: Blane Ohara, MD;  Location: [facility] CATH LAB;  Service: Cardiovascular;  Laterality: N/A;  . 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 Colonoscopy with propofol N/A 12/19/2014    Dr. Oneida Alar: six simple adenomas and 3 hyperplastic polyps. Had flat mid-transverse colon polyp and referred to [facility] for endoscopic mucosal resection, which is scheduled for July 22  . Esophagogastroduodenoscopy (egd)  with propofol N/A 12/19/2014    Dr. Oneida Alar:  without varices, small hiatal hernia noted, moderate non-erosive gastritis and mild duodenitis.  POSITIVE H.PYLORI. Prescribed Prevpac.  . Polypectomy N/A 12/19/2014    Procedure: POLYPECTOMY;  Surgeon: Danie Binder, MD;  Location: [facility] ORS;  Service: Endoscopy;  Laterality: N/A;  . Esophageal biopsy N/A 12/19/2014    Procedure: BIOPSY;  Surgeon: Danie Binder, MD;  Location: [facility] ORS;  Service: Endoscopy;  Laterality: N/A;  . Colonoscopy  June 2016    [facility]: with endoscopic mucosal resection. 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 Needs colonoscopy in 1 year.     Current Outpatient Prescriptions  Medication Sig Dispense Refill  . albuterol (PROVENTIL HFA;VENTOLIN HFA) 108 (90 BASE) MCG/ACT inhaler Inhale 1 puff into the lungs every 6 (six) hours as needed for wheezing or shortness of breath.    Marland Kitchen aspirin EC 81 MG tablet Take 81 mg by mouth daily.    . B Complex-Folic Acid (B COMPLEX-VITAMIN B12) TABS Take 1 tablet by mouth daily.    . Choline Fenofibrate 135 MG capsule Take 135 mg by mouth daily.    . cloNIDine (CATAPRES - DOSED IN MG/24 HR) 0.1 mg/24hr patch Place 0.1 mg onto the skin once a week. Changes on Saturday.    . clopidogrel (PLAVIX) 75 MG tablet Take 1 tablet (75 mg total) by mouth daily with breakfast. 30 tablet 6  .  hydrochlorothiazide (HYDRODIURIL) 25 MG tablet Take 25 mg by mouth daily.    . metoprolol succinate (TOPROL-XL) 25 MG 24 hr tablet Take 25 mg by mouth daily.    . Multiple Vitamin (MULTIVITAMIN) tablet Take 1 tablet by mouth daily.    . nitroGLYCERIN (NITROSTAT) 0.4 MG SL tablet Place 1 tablet (0.4 mg total) under the tongue every 5 (five) minutes as needed for chest pain. 25 tablet 3  . Omega-3 Fatty Acids (FISH OIL) 1000 MG CAPS Take 2,400 mg by mouth.    . Umeclidinium-Vilanterol 62.5-25 MCG/INH AEPB Inhale 1 puff into the lungs daily.     . Ledipasvir-Sofosbuvir (HARVONI) 90-400 MG TABS Take by mouth.       No current facility-administered medications for this visit.    Allergies as of 05/15/2015 - Review Complete 05/15/2015  Allergen Reaction Noted  . Lisinopril Swelling 08/18/2012  . Neosporin [neomycin-bacitracin zn-polymyx]  10/28/2013    Family History  Problem Relation Age of Onset  . Colon cancer Neg Hx     Social History   Social History  . Marital Status: Single    Spouse Name: N/A  . Number of Children: N/A  . Years of Education: N/A   Social History Main Topics  . Smoking status: Current Every Day Smoker -- 0.50 packs/day for 40 years    Types: Cigarettes    Start date: 10/10/1971  . Smokeless tobacco: Never Used     Comment: trying to wean down, now on 6-7 cigarettes a day  . Alcohol Use: 0.0 oz/week    0 Standard drinks or equivalent per week     Comment: a few beers once a month   . Drug Use: No  . Sexual Activity: Not Asked   Other Topics Concern  . None   Social History Narrative    Review of Systems: As menitoned in HPI   Physical Exam: BP 143/80 mmHg  Pulse 95  Temp(Src) 97.4 F (36.3 C) (Oral)  Ht 5\' 9"  (1.753 m)  Wt 189 lb 6.4 oz (85.911 kg)  BMI 27.96 kg/m2 General:   Alert and oriented. No distress noted. Pleasant and cooperative.  Head:  Normocephalic and atraumatic. Abdomen:  +BS, soft, non-tender and non-distended. No rebound or guarding. Liver margin palpable 1-2 fingerbreadths below right costal margin Msk:  Symmetrical without gross deformities. Normal posture. Extremities:  Without edema. Neurologic:  Alert and  oriented x4;  grossly normal neurologically. Psych:  Alert and cooperative. Normal mood and affect.  Lab Results  Component Value Date   ALT 26 03/13/2015   AST 24 03/13/2015   ALKPHOS 43 03/13/2015   BILITOT 0.4 03/13/2015   Lab Results  Component Value Date   WBC 7.2 03/13/2015   HGB 15.4 03/13/2015   HCT 43.1 03/13/2015   MCV 90.0 03/13/2015   PLT 201 03/13/2015   Lab Results  Component Value Date    CREATININE 0.91 03/13/2015   BUN 12 03/13/2015   NA 139 03/13/2015   K 3.7 03/13/2015   CL 107 03/13/2015   CO2 23 03/13/2015

## 2015-05-15 NOTE — Assessment & Plan Note (Signed)
Surveillance in June 2017 due to multiple adenomas and recent endoscopic mucosal resection of adenoma showing focal high grade dysplasia.

## 2015-05-16 LAB — H. PYLORI BREATH TEST: H. PYLORI BREATH TEST: NOT DETECTED

## 2015-05-16 NOTE — Progress Notes (Signed)
cc'ed to pcp °

## 2015-05-24 NOTE — Progress Notes (Signed)
Quick Note:  H.pylori eradicated. ______

## 2015-06-06 ENCOUNTER — Other Ambulatory Visit: Payer: Self-pay | Admitting: Physician Assistant

## 2015-06-06 LAB — LIPID PANEL
CHOL/HDL RATIO: 5.3 ratio — AB (ref <=5.0)
Cholesterol: 231 mg/dL — ABNORMAL HIGH (ref 125–200)
HDL: 44 mg/dL (ref >=40)
LDL CALC: 160 mg/dL — AB (ref <130)
TRIGLYCERIDES: 137 mg/dL (ref <150)
VLDL: 27 mg/dL (ref <30)

## 2015-06-06 LAB — COMPREHENSIVE METABOLIC PANEL
ALT: 23 U/L (ref 9–46)
AST: 20 U/L (ref 10–35)
Albumin: 4.5 g/dL (ref 3.6–5.1)
Alkaline Phosphatase: 35 U/L — ABNORMAL LOW (ref 40–115)
BILIRUBIN TOTAL: 0.4 mg/dL (ref 0.2–1.2)
BUN: 22 mg/dL (ref 7–25)
CALCIUM: 9.9 mg/dL (ref 8.6–10.3)
CO2: 27 mmol/L (ref 20–31)
CREATININE: 1.05 mg/dL (ref 0.70–1.33)
Chloride: 105 mmol/L (ref 98–110)
GLUCOSE: 104 mg/dL — AB (ref 65–99)
Potassium: 4.3 mmol/L (ref 3.5–5.3)
Sodium: 141 mmol/L (ref 135–146)
Total Protein: 7.9 g/dL (ref 6.1–8.1)

## 2015-06-12 ENCOUNTER — Ambulatory Visit: Payer: Self-pay | Admitting: Physician Assistant

## 2015-06-12 ENCOUNTER — Encounter: Payer: Self-pay | Admitting: Physician Assistant

## 2015-06-12 VITALS — BP 136/78 | HR 100 | Temp 98.2°F | Ht 69.0 in | Wt 193.2 lb

## 2015-06-12 DIAGNOSIS — I25119 Atherosclerotic heart disease of native coronary artery with unspecified angina pectoris: Secondary | ICD-10-CM

## 2015-06-12 DIAGNOSIS — E785 Hyperlipidemia, unspecified: Secondary | ICD-10-CM

## 2015-06-12 DIAGNOSIS — K219 Gastro-esophageal reflux disease without esophagitis: Secondary | ICD-10-CM

## 2015-06-12 DIAGNOSIS — F17200 Nicotine dependence, unspecified, uncomplicated: Secondary | ICD-10-CM

## 2015-06-12 DIAGNOSIS — F17210 Nicotine dependence, cigarettes, uncomplicated: Secondary | ICD-10-CM

## 2015-06-12 DIAGNOSIS — I10 Essential (primary) hypertension: Secondary | ICD-10-CM

## 2015-06-12 MED ORDER — RANITIDINE HCL 300 MG PO CAPS: 300.0000 mg | ORAL_CAPSULE | Freq: Every evening | ORAL | Status: DC

## 2015-06-12 MED ORDER — COLESEVELAM HCL 625 MG PO TABS: 1875.0000 mg | ORAL_TABLET | Freq: Two times a day (BID) | ORAL | Status: DC

## 2015-06-12 NOTE — Progress Notes (Signed)
BP 136/78 mmHg  Pulse 100  Temp(Src) 98.2 F (36.8 C)  Ht 5\' 9"  (1.753 m)  Wt 193 lb 3.2 oz (87.635 kg)  BMI 28.52 kg/m2  SpO2 97%   Subjective:    Patient ID: Duane Adams, male    DOB: August 29, 1955, 59 y.o.   MRN: [id number]  HPI: Duane Adams is a 59 y.o. male presenting on 06/12/2015 for Follow-up   HPI   Pt has medicaid- it hasn't been updated in [facility] for some reason.  -pt intolerant of statins- made LFTs go up and made him feel bad -pt feels tired often and sometimes gets angina.  Otherwise he is doing well  Relevant past medical, surgical, family and social history reviewed and updated as indicated. Interim medical history since our last visit reviewed. Allergies and medications reviewed and updated.  Current outpatient prescriptions:  .  aspirin EC 81 MG tablet, Take 81 mg by mouth daily., Disp: , Rfl:  .  B Complex-Folic Acid (B COMPLEX-VITAMIN B12) TABS, Take 1 tablet by mouth daily., Disp: , Rfl:  .  Choline Fenofibrate 135 MG capsule, Take 135 mg by mouth daily., Disp: , Rfl:  .  cloNIDine (CATAPRES - DOSED IN MG/24 HR) 0.1 mg/24hr patch, Place 0.1 mg onto the skin once a week. Changes on Saturday., Disp: , Rfl:  .  clopidogrel (PLAVIX) 75 MG tablet, Take 1 tablet (75 mg total) by mouth daily with breakfast., Disp: 30 tablet, Rfl: 6 .  hydrochlorothiazide (HYDRODIURIL) 25 MG tablet, Take 25 mg by mouth daily., Disp: , Rfl:  .  metoprolol succinate (TOPROL-XL) 25 MG 24 hr tablet, Take 25 mg by mouth daily., Disp: , Rfl:  .  Multiple Vitamin (MULTIVITAMIN) tablet, Take 1 tablet by mouth daily., Disp: , Rfl:  .  nitroGLYCERIN (NITROSTAT) 0.4 MG SL tablet, Place 1 tablet (0.4 mg total) under the tongue every 5 (five) minutes as needed for chest pain., Disp: 25 tablet, Rfl: 3 .  Omega-3 Fatty Acids (FISH OIL) 1000 MG CAPS, Take 2,400 mg by mouth., Disp: , Rfl:    Review of Systems  Constitutional: Positive for diaphoresis, appetite change, fatigue and unexpected  weight change. Negative for fever and chills.  HENT: Positive for dental problem and hearing loss. Negative for congestion, drooling, ear pain, facial swelling, mouth sores, sneezing, sore throat, trouble swallowing and voice change.   Eyes: Positive for itching. Negative for pain, discharge, redness and visual disturbance.  Respiratory: Positive for shortness of breath. Negative for cough, choking and wheezing.   Cardiovascular: Positive for chest pain. Negative for palpitations and leg swelling.  Gastrointestinal: Negative for vomiting, abdominal pain, diarrhea, constipation and blood in stool.  Endocrine: Negative for cold intolerance, heat intolerance and polydipsia.  Genitourinary: Negative for dysuria, hematuria and decreased urine volume.  Musculoskeletal: Positive for back pain, arthralgias and gait problem.  Skin: Negative for rash.  Allergic/Immunologic: Negative for environmental allergies.  Neurological: Positive for light-headedness. Negative for seizures, syncope and headaches.  Hematological: Negative for adenopathy.  Psychiatric/Behavioral: Positive for dysphoric mood. Negative for suicidal ideas and agitation. The patient is not nervous/anxious.     Per HPI unless specifically indicated above     Objective:    BP 136/78 mmHg  Pulse 100  Temp(Src) 98.2 F (36.8 C)  Ht 5\' 9"  (1.753 m)  Wt 193 lb 3.2 oz (87.635 kg)  BMI 28.52 kg/m2  SpO2 97%  Wt Readings from Last 3 Encounters:  06/12/15 193 lb 3.2 oz (87.635 kg)  05/15/15 189 lb 6.4 oz (85.911 kg)  03/13/15 187 lb 9.6 oz (85.095 kg)    Physical Exam  Constitutional: He is oriented to person, place, and time. He appears well-developed and well-nourished.  HENT:  Head: Normocephalic and atraumatic.  Neck: Neck supple.  Cardiovascular: Normal rate and regular rhythm.   Pulmonary/Chest: Effort normal and breath sounds normal. He has no wheezes.  Abdominal: Soft. Bowel sounds are normal. There is no tenderness.   Musculoskeletal: He exhibits no edema.  Lymphadenopathy:    He has no cervical adenopathy.  Neurological: He is alert and oriented to person, place, and time.  Skin: Skin is warm and dry.  Psychiatric: He has a normal mood and affect. His behavior is normal.  Vitals reviewed.   Results for orders placed or performed in visit on 06/06/15  Comprehensive metabolic panel  Result Value Ref Range   Sodium 141 135 - 146 mmol/L   Potassium 4.3 3.5 - 5.3 mmol/L   Chloride 105 98 - 110 mmol/L   CO2 27 20 - 31 mmol/L   Glucose, Bld 104 (H) 65 - 99 mg/dL   BUN 22 7 - 25 mg/dL   Creat 1.05 0.70 - 1.33 mg/dL   Total Bilirubin 0.4 0.2 - 1.2 mg/dL   Alkaline Phosphatase 35 (L) 40 - 115 U/L   AST 20 10 - 35 U/L   ALT 23 9 - 46 U/L   Total Protein 7.9 6.1 - 8.1 g/dL   Albumin 4.5 3.6 - 5.1 g/dL   Calcium 9.9 8.6 - 10.3 mg/dL  Lipid panel  Result Value Ref Range   Cholesterol 231 (H) 125 - 200 mg/dL   Triglycerides 137 <150 mg/dL   HDL 44 >=40 mg/dL   Total CHOL/HDL Ratio 5.3 (H) <=5.0 Ratio   VLDL 27 <30 mg/dL   LDL Cholesterol 160 (H) <130 mg/dL      Assessment & Plan:   Encounter Diagnoses  Name Primary?  . Essential hypertension, benign Yes  . Coronary artery disease involving native coronary artery of native heart with angina pectoris ([place])   . Tobacco use disorder   . Cigarette nicotine dependence, uncomplicated   . Gastroesophageal reflux disease, esophagitis presence not specified   . Hyperlipemia      -Pt will cont current meds. Discussed pt possible gerd. Pt states CP every 2-3 days.  Pt doesn't think he has gerd but is willing to take PPI for one month to see if episodes decrease.  Will get pt back in with cardiologist if it's time -rx welchol for LDL as pt has been intolerant of statins -pt has no f/u scheduled with cardiology.  Will check on this -counseled on smoking cessation -F/u 1 mo to recheck gerd

## 2015-06-13 ENCOUNTER — Other Ambulatory Visit: Payer: Self-pay | Admitting: Physician Assistant

## 2015-06-13 DIAGNOSIS — E785 Hyperlipidemia, unspecified: Secondary | ICD-10-CM | POA: Insufficient documentation

## 2015-06-13 MED ORDER — COLESTIPOL HCL 1 G PO TABS: 2.0000 g | ORAL_TABLET | Freq: Two times a day (BID) | ORAL | Status: DC

## 2015-07-03 ENCOUNTER — Other Ambulatory Visit: Payer: Self-pay | Admitting: Gastroenterology

## 2015-07-03 ENCOUNTER — Other Ambulatory Visit: Payer: Self-pay

## 2015-07-03 DIAGNOSIS — B182 Chronic viral hepatitis C: Secondary | ICD-10-CM

## 2015-07-17 ENCOUNTER — Ambulatory Visit: Payer: Self-pay | Admitting: Physician Assistant

## 2015-07-17 ENCOUNTER — Encounter: Payer: Self-pay | Admitting: Physician Assistant

## 2015-07-17 VITALS — BP 168/96 | HR 104 | Temp 97.9°F | Ht 69.0 in | Wt 195.6 lb

## 2015-07-17 DIAGNOSIS — K219 Gastro-esophageal reflux disease without esophagitis: Secondary | ICD-10-CM

## 2015-07-17 DIAGNOSIS — F17218 Nicotine dependence, cigarettes, with other nicotine-induced disorders: Secondary | ICD-10-CM

## 2015-07-17 DIAGNOSIS — I25119 Atherosclerotic heart disease of native coronary artery with unspecified angina pectoris: Secondary | ICD-10-CM

## 2015-07-17 MED ORDER — SUCRALFATE 1 G PO TABS: 1.0000 g | ORAL_TABLET | Freq: Three times a day (TID) | ORAL | Status: DC

## 2015-07-17 NOTE — Patient Instructions (Signed)

## 2015-07-17 NOTE — Progress Notes (Signed)
BP 168/96 mmHg  Pulse 104  Temp(Src) 97.9 F (36.6 C)  Ht 5\' 9"  (1.753 m)  Wt 195 lb 9.6 oz (88.724 kg)  BMI 28.87 kg/m2  SpO2 98%   Subjective:    Patient ID: Duane Adams, male    DOB: [birth year], 60 y.o.   MRN: [id number]  HPI: Duane Adams is a 60 y.o. male presenting on 07/17/2015 for Gastroesophageal Reflux   HPI Chief Complaint  Patient presents with  . Gastroesophageal Reflux    pt states ranitidine causes his stomach to hurt    Small amount of material comes up into mouth after he eats- he doesn't throw it up, it goes back down.    Pt is quite convinced that he does not have GERD.  Reviewed EGD results with pt which showed gastritis and Dr Oneida Alar recommendation that he start a proton pump inhibitor.  Pt has never followed up with cardiology after his Lexiscan months ago.  Previous office notes have discussed his situation there but he agrees he needs to return to see the cardiologist.  Pt is concerned that he will be returning to jail in the upcoming months.  Relevant past medical, surgical, family and social history reviewed and updated as indicated. Interim medical history since our last visit reviewed. Allergies and medications reviewed and updated.  CURRENT MEDS: Aspirin 81mg  daily B Complex vitamin Fenofibrate Clonidine plavix Colestid HCTZ Toprol-XL MVI Nitrostat Fish Oil Ranitidine   Review of Systems  Constitutional: Positive for diaphoresis, fatigue and unexpected weight change. Negative for fever, chills and appetite change.  HENT: Positive for dental problem and hearing loss. Negative for congestion, drooling, ear pain, facial swelling, mouth sores, sneezing, sore throat, trouble swallowing and voice change.   Eyes: Positive for pain. Negative for discharge, redness, itching and visual disturbance.  Respiratory: Positive for wheezing. Negative for cough, choking and shortness of breath.   Cardiovascular: Positive for chest pain.  Negative for palpitations and leg swelling.  Gastrointestinal: Positive for abdominal pain. Negative for vomiting, diarrhea, constipation and blood in stool.  Endocrine: Negative for cold intolerance, heat intolerance and polydipsia.  Genitourinary: Negative for dysuria, hematuria and decreased urine volume.  Musculoskeletal: Positive for back pain, arthralgias and gait problem.  Skin: Negative for rash.  Allergic/Immunologic: Negative for environmental allergies.  Neurological: Positive for light-headedness. Negative for seizures, syncope and headaches.  Hematological: Negative for adenopathy.  Psychiatric/Behavioral: Positive for dysphoric mood. Negative for suicidal ideas and agitation. The patient is nervous/anxious.     Per HPI unless specifically indicated above     Objective:    BP 168/96 mmHg  Pulse 104  Temp(Src) 97.9 F (36.6 C)  Ht 5\' 9"  (1.753 m)  Wt 195 lb 9.6 oz (88.724 kg)  BMI 28.87 kg/m2  SpO2 98%  Wt Readings from Last 3 Encounters:  07/17/15 195 lb 9.6 oz (88.724 kg)  06/12/15 193 lb 3.2 oz (87.635 kg)  05/15/15 189 lb 6.4 oz (85.911 kg)    Physical Exam  Constitutional: He is oriented to person, place, and time. He appears well-developed and well-nourished.  HENT:  Head: Normocephalic and atraumatic.  Neck: Neck supple.  Cardiovascular: Normal rate and regular rhythm.   Pulmonary/Chest: Effort normal and breath sounds normal. He has no wheezes.  Abdominal: Soft. Bowel sounds are normal. There is tenderness (mild diffuse).  Musculoskeletal: He exhibits no edema.  Lymphadenopathy:    He has no cervical adenopathy.  Neurological: He is alert and oriented to person, place, and time.  Skin: Skin is warm and dry.  Psychiatric: He has a normal mood and affect. His behavior is normal.  Vitals reviewed.       Assessment & Plan:    Encounter Diagnoses  Name Primary?  . Coronary artery disease involving native coronary artery of native heart with angina  pectoris ([place]) Yes  . Gastroesophageal reflux disease, esophagitis presence not specified   . Nicotine dependence, cigarettes, with other nicotine-induced disorders      Refer back to cards for "chest pain". Pt has no f/u since lexiscan on 10/13/14  Stop ranitidine.  I would like pt to be on ppi but don't want to interfere with plavix. Some input from cardiology would be helpful.  Do not want pt to have heart attack due to treatment for his GERD.  For now will rx carafate.  Gave reading info on gerd and discussed it with pt.  Pt states refraining from these things that exacerbate GERD will decrease his quality of life (ie he doesn't want to stop chocolate, coffee, smoking).  Discussed with pt that he knows what is recommended and the decision for his health is ultimately his decision.  Counseled again on SMOKING CESSATION   F/u 1 mo. RTO sooner prn

## 2015-07-23 DIAGNOSIS — Z720 Tobacco use: Secondary | ICD-10-CM | POA: Insufficient documentation

## 2015-07-23 DIAGNOSIS — F17200 Nicotine dependence, unspecified, uncomplicated: Secondary | ICD-10-CM | POA: Insufficient documentation

## 2015-07-23 DIAGNOSIS — K219 Gastro-esophageal reflux disease without esophagitis: Secondary | ICD-10-CM | POA: Insufficient documentation

## 2015-07-23 DIAGNOSIS — F17218 Nicotine dependence, cigarettes, with other nicotine-induced disorders: Secondary | ICD-10-CM | POA: Insufficient documentation

## 2015-07-26 ENCOUNTER — Encounter: Payer: Self-pay | Admitting: Cardiology

## 2015-07-26 ENCOUNTER — Ambulatory Visit (INDEPENDENT_AMBULATORY_CARE_PROVIDER_SITE_OTHER): Payer: Medicaid Other | Admitting: Cardiology

## 2015-07-26 VITALS — BP 138/84 | HR 98 | Ht 69.0 in | Wt 191.0 lb

## 2015-07-26 DIAGNOSIS — I10 Essential (primary) hypertension: Secondary | ICD-10-CM

## 2015-07-26 DIAGNOSIS — R0989 Other specified symptoms and signs involving the circulatory and respiratory systems: Secondary | ICD-10-CM | POA: Diagnosis not present

## 2015-07-26 NOTE — Patient Instructions (Signed)
Your physician recommends that you schedule a follow-up appointment in: 2 months with Dr Harl Bowie  Your physician recommends that you continue on your current medications as directed. Please refer to the Current Medication list given to you today.   If you need a refill on your cardiac medications before your next appointment, please call your pharmacy.   Your physician has requested that you have a carotid duplex. This test is an ultrasound of the carotid arteries in your neck. It looks at blood flow through these arteries that supply the brain with blood. Allow one hour for this exam. There are no restrictions or special instructions.       Thank you for choosing [facility] !

## 2015-07-26 NOTE — Progress Notes (Signed)
Patient ID: Duane Adams, male   DOB: 16-Jan-1956, 60 y.o.   MRN: [id number]     Clinical Summary [name] is a 60 y.o.male seen today for follow up of the following medical problems.   1. CAD  -  cath 04/11/13 showed significant LAD and RCA disease, s/p DES to both - echo 03/2013 with normal LVEF  - compliant w/ meds including ASA and plavix  - describes some feelings of generalized fatigue that has gotten better with decreasing his metoprolol dose. Hx of angioedema on lisinopril   - chronic aching pain left chest 3/10 in severity. Can be sharp pain at times. Typically occurs at rest. No other associated symptom. Better with deep breaths. Can last up to 24 hours without relief. No relation to food. - pain ongoing for 10 years, slow steady progression. Never relieved with stents.  - DOE at 1 block, though mainly limited by chronic hip pain.   2. HL  -  09/2013 TC 158 HDL 45 TG 255 LDL 62 - off statin due to prior history of hep c  3. HTN  - compliant with medications - checks at home, typically 140/80s    Past Medical History  Diagnosis Date  . Essential hypertension, benign   . Mixed hyperlipidemia   . Coronary artery disease     a. 03/2013 Cath/PCI: LM nl, LAD 46m(2.5x20 Promus Premier DES), LCX min irregs, RCA dom, 90p(3.0x20 Promus Premier DES), EF 55-65%.  . Tobacco abuse   . Skin cancer   . Hepatitis     Hepatitis C  . Helicobacter pylori gastritis JUN 2016 EGD/Bx    PREVPAK BID FOR 14 DAYS     Allergies  Allergen Reactions  . Lisinopril Swelling    Dizziness, chest pain   . Neosporin [Neomycin-Bacitracin Zn-Polymyx]      Current Outpatient Prescriptions  Medication Sig Dispense Refill  . aspirin EC 81 MG tablet Take 81 mg by mouth daily.    . B Complex-Folic Acid (B COMPLEX-VITAMIN B12) TABS Take 1 tablet by mouth daily.    . Choline Fenofibrate 135 MG capsule Take 135 mg by mouth daily.    . cloNIDine (CATAPRES - DOSED IN MG/24 HR) 0.1 mg/24hr  patch Place 0.1 mg onto the skin once a week. Changes on Saturday.    . clopidogrel (PLAVIX) 75 MG tablet Take 1 tablet (75 mg total) by mouth daily with breakfast. 30 tablet 6  . colestipol (COLESTID) 1 G tablet Take 2 tablets (2 g total) by mouth 2 (two) times daily. 120 tablet 4  . hydrochlorothiazide (HYDRODIURIL) 25 MG tablet Take 25 mg by mouth daily.    . metoprolol succinate (TOPROL-XL) 25 MG 24 hr tablet Take 25 mg by mouth daily.    . Multiple Vitamin (MULTIVITAMIN) tablet Take 1 tablet by mouth daily.    . nitroGLYCERIN (NITROSTAT) 0.4 MG SL tablet Place 1 tablet (0.4 mg total) under the tongue every 5 (five) minutes as needed for chest pain. 25 tablet 3  . Omega-3 Fatty Acids (FISH OIL) 1000 MG CAPS Take 2,400 mg by mouth.    . ranitidine (ZANTAC) 300 MG capsule Take 1 capsule (300 mg total) by mouth every evening. 30 capsule 3  . sucralfate (CARAFATE) 1 g tablet Take 1 tablet (1 g total) by mouth 4 (four) times daily -  before meals and at bedtime. (take 1 hour before meal) 120 tablet 1   No current facility-administered medications for this visit.  Past Surgical History  Procedure Laterality Date  . Hand reconstruction Left   . Leg tendon surgery Right   . Left heart catheterization with coronary angiogram N/A 04/11/2013    Procedure: LEFT HEART CATHETERIZATION WITH CORONARY ANGIOGRAM;  Surgeon: Blane Ohara, MD;  Location: [facility] CATH LAB;  Service: Cardiovascular;  Laterality: N/A;  . 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 Colonoscopy with propofol N/A 12/19/2014    Dr. Oneida Alar: six simple adenomas and 3 hyperplastic polyps. Had flat mid-transverse colon polyp and referred to [facility] for endoscopic mucosal resection, which is scheduled for July 22  . Esophagogastroduodenoscopy (egd) with propofol N/A 12/19/2014    Dr. Oneida Alar:  without varices, small hiatal hernia noted, moderate non-erosive gastritis and mild duodenitis.  POSITIVE  H.PYLORI. Prescribed Prevpac.  . Polypectomy N/A 12/19/2014    Procedure: POLYPECTOMY;  Surgeon: Danie Binder, MD;  Location: [facility] ORS;  Service: Endoscopy;  Laterality: N/A;  . Esophageal biopsy N/A 12/19/2014    Procedure: BIOPSY;  Surgeon: Danie Binder, MD;  Location: [facility] ORS;  Service: Endoscopy;  Laterality: N/A;  . Colonoscopy  June 2016    [facility]: with endoscopic mucosal resection. 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 Needs colonoscopy in 1 year.      Allergies  Allergen Reactions  . Lisinopril Swelling    Dizziness, chest pain   . Neosporin [Neomycin-Bacitracin Zn-Polymyx]       Family History  Problem Relation Age of Onset  . Colon cancer Neg Hx   . Stroke Mother   . Heart disease Father      Social History Mr. Drummond reports that he has been smoking Cigarettes.  He started smoking about 43 years ago. He has a 20 pack-year smoking history. He has never used smokeless tobacco. Mr. Delisle reports that he drinks alcohol.   Review of Systems CONSTITUTIONAL: No weight loss, fever, chills, weakness or fatigue.  HEENT: Eyes: No visual loss, blurred vision, double vision or yellow sclerae.No hearing loss, sneezing, congestion, runny nose or sore throat.  SKIN: No rash or itching.  CARDIOVASCULAR: per hpi RESPIRATORY: No shortness of breath, cough or sputum.  GASTROINTESTINAL: No anorexia, nausea, vomiting or diarrhea. No abdominal pain or blood.  GENITOURINARY: No burning on urination, no polyuria NEUROLOGICAL: No headache, dizziness, syncope, paralysis, ataxia, numbness or tingling in the extremities. No change in bowel or bladder control.  MUSCULOSKELETAL: No muscle, back pain, joint pain or stiffness.  LYMPHATICS: No enlarged nodes. No history of splenectomy.  PSYCHIATRIC: No history of depression or anxiety.  ENDOCRINOLOGIC: No reports of sweating, cold or heat intolerance. No polyuria or polydipsia.  Marland Kitchen   Physical Examination Filed Vitals:    07/26/15 0832  BP: 138/84  Pulse: 98   Filed Vitals:   07/26/15 0832  Height: _0  (1.753 m)  Weight: 191 lb (86.637 kg)    Gen: resting comfortably, no acute distress HEENT: no scleral icterus, pupils equal round and reactive, no palptable cervical adenopathy,  CV: RRR, no mr/g, no jvd. Bilateral carotid bruits Resp: Clear to auscultation bilaterally GI: abdomen is soft, non-tender, non-distended, normal bowel sounds, no hepatosplenomegaly MSK: extremities are warm, no edema.  Skin: warm, no rash Neuro:  no focal deficits Psych: appropriate affect   Diagnostic Studies 04/11/13 Cath  PROCEDURAL FINDINGS  Hemodynamics:  AO 178/93  LV 177/18  Coronary angiography:  Coronary dominance: right  Left mainstem: Widely  patent without obstructive disease  Left anterior descending (LAD): severe mid-vessel stenosis (80%) between the first and second septal perforators. The vessel reaches the LV apex and there is no other significant disease noted. Large D1 is patent.  Left circumflex (LCx): small vessel, supplies 2 OM branches. Mild irregularity noted.  Right coronary artery (RCA): large, dominant vessel. There is severe 90% proximal stenosis. Irregularity in the mid-vessel without significant stenosis. The PDA and PLA branches are both large without significant disease.  Left ventriculography: Left ventricular systolic function is normal, LVEF is estimated at 55-65%, there is no significant mitral regurgitation  PCI Note: Following the diagnostic procedure, the decision was made to proceed with PCI. The patient was loaded with plavix 600 mg. Weight-based bivalirudin was given for anticoagulation. I planned on treating the LAD and RCA, both of which have high-grade disease. Once a therapeutic ACT was achieved, a 6 [place] XB-LAD guide catheter was inserted. A cougar coronary guidewire was used to cross the lesion. The lesion was predilated with a 2.0 mm balloon. The lesion was then  stented with a 2.5x20 mm Promus Premier drug-eluting stent. The stent was postdilated with a 2.75 mm noncompliant balloon. Following PCI, there was 0% residual stenosis and TIMI-3 flow. Attention was then turned to the RCA. A JR-4 guide was used. The same cougar guidewire was used to cross the lesion. The lesion was dilated with a 2.0 mm balloon and stented with a 3.0x20 mm Promus Premier DES. The stent was post-dilated to 18 atm with a 3.25 mm Upton balloon. Final angiography confirmed an excellent result. The patient tolerated the procedure well. There were no immediate procedural complications. A TR band was used for radial hemostasis. The patient was transferred to the post catheterization recovery area for further monitoring.  PCI Data:  Lesion 1  Vessel - LAD/Segment - mid  Percent Stenosis (pre) 80  TIMI-flow 3  Stent 2.5x20 mm Promus Premier DES  Percent Stenosis (post) 0  TIMI-flow (post) 3  Lesion 2  Vessel - RCA/Segment - prox  Percent Stenosis (pre) 90  TIMI-flow 3  Stent 3.0x20 mm Promus Premier DES  Percent Stenosis (post) 0  TIMI-flow (post) 3  Final Conclusions:  Severe 2 vessel CAD with successful PCI of the LAD and RCA  Normal LV function  Recommendations:  ASA and plavix for at least 12 months.   04/04/13 Echo  LVEF 60-65%, no WMA, grade I diastolic dysfunction    0/2725 Exercise Nuclear Stress IMPRESSION: 1. No reversible ischemia or infarction. Submaximal stress test, thus diminishing the ability of this test to detect ischemia.  2. Normal left ventricular wall motion.  3. Left ventricular ejection fraction 78%  4. Low-risk stress test findings*. Consider Lexiscan in the future.  07/26/15 Clinic EKG (performed and reviwed in clinic): NSR Assessment and Plan   1. CAD  - s/p DES to LAD and DES to RCA in setting of angina  - he has favored continuing plavix, which given his stent burden is reasonable - chronic atypical chest pain  unchanged, no indication for repeat ischemic testing at this time.   2. HTN  - at goal, continue current meds  3. HL  - has not been on statin due to hep c history  4. Carotid bruits - order carotid US   F/u 2 months  Arnoldo Lenis, M.D.

## 2015-07-30 ENCOUNTER — Ambulatory Visit (HOSPITAL_COMMUNITY)
Admission: RE | Admit: 2015-07-30 | Discharge: 2015-07-30 | Disposition: A | Discharge status: Home / Self Care | Payer: Medicaid Other | Source: Ambulatory Visit | Attending: Cardiology | Admitting: Cardiology

## 2015-07-30 DIAGNOSIS — R0989 Other specified symptoms and signs involving the circulatory and respiratory systems: Secondary | ICD-10-CM

## 2015-07-30 DIAGNOSIS — I6523 Occlusion and stenosis of bilateral carotid arteries: Secondary | ICD-10-CM | POA: Diagnosis not present

## 2015-07-31 ENCOUNTER — Telehealth: Payer: Self-pay

## 2015-07-31 NOTE — Telephone Encounter (Signed)
Pt made aware of carotid US results.  He Voiced understanding

## 2015-08-03 LAB — CBC WITH DIFFERENTIAL/PLATELET
BASOS ABS: 0 10*3/uL (ref 0.0–0.1)
BASOS PCT: 0 % (ref 0–1)
EOS ABS: 0.3 10*3/uL (ref 0.0–0.7)
Eosinophils Relative: 3 % (ref 0–5)
HCT: 45.8 % (ref 39.0–52.0)
HEMOGLOBIN: 15.6 g/dL (ref 13.0–17.0)
Lymphocytes Relative: 34 % (ref 12–46)
Lymphs Abs: 3.1 10*3/uL (ref 0.7–4.0)
MCH: 31.1 pg (ref 26.0–34.0)
MCHC: 34.1 g/dL (ref 30.0–36.0)
MCV: 91.2 fL (ref 78.0–100.0)
MONOS PCT: 9 % (ref 3–12)
MPV: 11.3 fL (ref 8.6–12.4)
Monocytes Absolute: 0.8 10*3/uL (ref 0.1–1.0)
NEUTROS ABS: 5 10*3/uL (ref 1.7–7.7)
NEUTROS PCT: 54 % (ref 43–77)
PLATELETS: 235 10*3/uL (ref 150–400)
RBC: 5.02 MIL/uL (ref 4.22–5.81)
RDW: 13.3 % (ref 11.5–15.5)
WBC: 9.2 10*3/uL (ref 4.0–10.5)

## 2015-08-03 LAB — PROTIME-INR
INR: 0.95 (ref <1.50)
PROTHROMBIN TIME: 12.8 s (ref 11.6–15.2)

## 2015-08-04 LAB — COMPLETE METABOLIC PANEL WITH GFR
ALT: 20 U/L (ref 9–46)
AST: 19 U/L (ref 10–35)
Albumin: 4.7 g/dL (ref 3.6–5.1)
Alkaline Phosphatase: 38 U/L — ABNORMAL LOW (ref 40–115)
BUN: 17 mg/dL (ref 7–25)
CALCIUM: 10 mg/dL (ref 8.6–10.3)
CHLORIDE: 101 mmol/L (ref 98–110)
CO2: 25 mmol/L (ref 20–31)
CREATININE: 0.99 mg/dL (ref 0.70–1.33)
GFR, Est African American: >89 mL/min (ref >=60)
GFR, Est Non African American: 84 mL/min (ref >=60)
GLUCOSE: 99 mg/dL (ref 65–99)
POTASSIUM: 4.2 mmol/L (ref 3.5–5.3)
SODIUM: 136 mmol/L (ref 135–146)
Total Bilirubin: 0.4 mg/dL (ref 0.2–1.2)
Total Protein: 7.9 g/dL (ref 6.1–8.1)

## 2015-08-06 LAB — HEPATITIS C RNA QUANTITATIVE: HCV QUANT: NOT DETECTED [IU]/mL (ref <15)

## 2015-08-07 NOTE — Progress Notes (Signed)
Quick Note:  Viral load remains undetectable! I recommend one final check in June 2017. He should return then to arrange a colonoscopy, and we can do an elastography at that time as well. LFTs are perfect. ______

## 2015-08-09 ENCOUNTER — Encounter: Payer: Self-pay | Admitting: Gastroenterology

## 2015-08-09 NOTE — Progress Notes (Signed)
Quick Note:  Pt is aware. Routing to [place] to schedule appt in June. ______

## 2015-08-09 NOTE — Progress Notes (Signed)
APPT MADE AND LETTER SENT  °

## 2015-08-14 ENCOUNTER — Ambulatory Visit: Payer: Self-pay | Admitting: Physician Assistant

## 2015-08-14 ENCOUNTER — Encounter: Payer: Self-pay | Admitting: Physician Assistant

## 2015-08-14 VITALS — BP 162/80 | HR 93 | Temp 97.9°F | Ht 69.0 in | Wt 195.2 lb

## 2015-08-14 DIAGNOSIS — I10 Essential (primary) hypertension: Secondary | ICD-10-CM

## 2015-08-14 DIAGNOSIS — F17218 Nicotine dependence, cigarettes, with other nicotine-induced disorders: Secondary | ICD-10-CM

## 2015-08-14 DIAGNOSIS — K219 Gastro-esophageal reflux disease without esophagitis: Secondary | ICD-10-CM

## 2015-08-14 MED ORDER — PANTOPRAZOLE SODIUM 40 MG PO TBEC: 40.0000 mg | DELAYED_RELEASE_TABLET | Freq: Every day | ORAL | Status: DC

## 2015-08-14 NOTE — Patient Instructions (Signed)
Gastroesophageal Reflux Disease, Adult Normally, food travels down the esophagus and stays in the stomach to be digested. If a person has gastroesophageal reflux disease (GERD), food and stomach acid move back up into the esophagus. When this happens, the esophagus becomes sore and swollen (inflamed). Over time, GERD can make small holes (ulcers) in the lining of the esophagus. HOME CARE Diet  Follow a diet as told by your doctor. You may need to avoid foods and drinks such as:  Coffee and tea (with or without caffeine).  Drinks that contain alcohol.  Energy drinks and sports drinks.  Carbonated drinks or sodas.  Chocolate and cocoa.  Peppermint and mint flavorings.  Garlic and onions.  Horseradish.  Spicy and acidic foods, such as peppers, chili powder, curry powder, vinegar, hot sauces, and BBQ sauce.  Citrus fruit juices and citrus fruits, such as oranges, lemons, and limes.  Tomato-based foods, such as red sauce, chili, salsa, and pizza with red sauce.  Fried and fatty foods, such as donuts, french fries, potato chips, and high-fat dressings.  High-fat meats, such as hot dogs, rib eye steak, sausage, ham, and bacon.  High-fat dairy items, such as whole milk, butter, and cream cheese.  Eat small meals often. Avoid eating large meals.  Avoid drinking large amounts of liquid with your meals.  Avoid eating meals during the 2-3 hours before bedtime.  Avoid lying down right after you eat.  Do not exercise right after you eat. General Instructions  Pay attention to any changes in your symptoms.  Take over-the-counter and prescription medicines only as told by your doctor. Do not take aspirin, ibuprofen, or other NSAIDs unless your doctor says it is okay.  Do not use any tobacco products, including cigarettes, chewing tobacco, and e-cigarettes. If you need help quitting, ask your doctor.  Wear loose clothes. Do not wear anything tight around your waist.  Raise  (elevate) the head of your bed about 6 inches (15 cm).  Try to lower your stress. If you need help doing this, ask your doctor.  If you are overweight, lose an amount of weight that is healthy for you. Ask your doctor about a safe weight loss goal.  Keep all follow-up visits as told by your doctor. This is important. GET HELP IF:  You have new symptoms.  You lose weight and you do not know why it is happening.  You have trouble swallowing, or it hurts to swallow.  You have wheezing or a cough that keeps happening.  Your symptoms do not get better with treatment.  You have a hoarse voice. GET HELP RIGHT AWAY IF:  You have pain in your arms, neck, jaw, teeth, or back.  You feel sweaty, dizzy, or light-headed.  You have chest pain or shortness of breath.  You throw up (vomit) and your throw up looks like blood or coffee grounds.  You pass out (faint).  Your poop (stool) is bloody or black.  You cannot swallow, drink, or eat.   This information is not intended to replace advice given to you by your health care provider. Make sure you discuss any questions you have with your health care provider.   Document Released: 12/17/2007 Document Revised: 03/21/2015 Document Reviewed: 10/25/2014 Elsevier Interactive Patient Education 2016 [employer] Inc.  -   

## 2015-08-14 NOTE — Progress Notes (Signed)
BP 162/80 mmHg  Pulse 93  Temp(Src) 97.9 F (36.6 C)  Ht 5\' 9"  (1.753 m)  Wt 195 lb 3.2 oz (88.542 kg)  BMI 28.81 kg/m2  SpO2 98%   Subjective:    Patient ID: Duane Adams, male    DOB: 10/22/1955, 60 y.o.   MRN: [id number]  HPI: Duane Adams is a 60 y.o. male presenting on 08/14/2015 for Gastroesophageal Reflux   HPI Pt states his bp is up today due to stress about going to court soon.  He is still having the pain in his abd and chest.  Reviewed cardiology notes from January.  Pt states no changes since his last appt at [facility].   Relevant past medical, surgical, family and social history reviewed and updated as indicated. Interim medical history since our last visit reviewed. Allergies and medications reviewed and updated.   Current outpatient prescriptions:  .  aspirin EC 81 MG tablet, Take 81 mg by mouth daily., Disp: , Rfl:  .  B Complex-Folic Acid (B COMPLEX-VITAMIN B12) TABS, Take 1 tablet by mouth daily., Disp: , Rfl:  .  Choline Fenofibrate 135 MG capsule, Take 135 mg by mouth daily., Disp: , Rfl:  .  cloNIDine (CATAPRES - DOSED IN MG/24 HR) 0.1 mg/24hr patch, Place 0.1 mg onto the skin once a week. Changes on Saturday., Disp: , Rfl:  .  clopidogrel (PLAVIX) 75 MG tablet, Take 1 tablet (75 mg total) by mouth daily with breakfast., Disp: 30 tablet, Rfl: 6 .  colestipol (COLESTID) 1 G tablet, Take 2 tablets (2 g total) by mouth 2 (two) times daily., Disp: 120 tablet, Rfl: 4 .  hydrochlorothiazide (HYDRODIURIL) 25 MG tablet, Take 25 mg by mouth daily., Disp: , Rfl:  .  metoprolol succinate (TOPROL-XL) 25 MG 24 hr tablet, Take 25 mg by mouth daily., Disp: , Rfl:  .  Multiple Vitamin (MULTIVITAMIN) tablet, Take 1 tablet by mouth daily., Disp: , Rfl:  .  nitroGLYCERIN (NITROSTAT) 0.4 MG SL tablet, Place 1 tablet (0.4 mg total) under the tongue every 5 (five) minutes as needed for chest pain., Disp: 25 tablet, Rfl: 3 .  Omega-3 Fatty Acids (FISH OIL) 1000 MG CAPS, Take 2,400  mg by mouth., Disp: , Rfl:  .  sucralfate (CARAFATE) 1 g tablet, Take 1 tablet (1 g total) by mouth 4 (four) times daily -  before meals and at bedtime. (take 1 hour before meal), Disp: 120 tablet, Rfl: 1 .  ranitidine (ZANTAC) 300 MG capsule, Take 1 capsule (300 mg total) by mouth every evening. (Patient not taking: Reported on 08/14/2015), Disp: 30 capsule, Rfl: 3  Review of Systems  Constitutional: Positive for fatigue. Negative for fever, chills, diaphoresis, appetite change and unexpected weight change.  HENT: Positive for dental problem and hearing loss. Negative for congestion, drooling, ear pain, facial swelling, mouth sores, sneezing, sore throat, trouble swallowing and voice change.   Eyes: Positive for redness and itching. Negative for pain, discharge and visual disturbance.  Respiratory: Positive for shortness of breath and wheezing. Negative for cough and choking.   Cardiovascular: Positive for chest pain. Negative for palpitations and leg swelling.  Gastrointestinal: Positive for abdominal pain. Negative for vomiting, diarrhea, constipation and blood in stool.  Endocrine: Negative for cold intolerance, heat intolerance and polydipsia.  Genitourinary: Negative for dysuria, hematuria and decreased urine volume.  Musculoskeletal: Positive for back pain and gait problem. Negative for arthralgias.  Skin: Negative for rash.  Allergic/Immunologic: Negative for environmental allergies.  Neurological:  Negative for seizures, syncope, light-headedness and headaches.  Hematological: Negative for adenopathy.  Psychiatric/Behavioral: Positive for dysphoric mood and agitation. Negative for suicidal ideas. The patient is nervous/anxious.     Per HPI unless specifically indicated above     Objective:    BP 162/80 mmHg  Pulse 93  Temp(Src) 97.9 F (36.6 C)  Ht 5\' 9"  (1.753 m)  Wt 195 lb 3.2 oz (88.542 kg)  BMI 28.81 kg/m2  SpO2 98%  Wt Readings from Last 3 Encounters:  08/14/15 195 lb  3.2 oz (88.542 kg)  07/26/15 191 lb (86.637 kg)  07/17/15 195 lb 9.6 oz (88.724 kg)    Physical Exam  Constitutional: He is oriented to person, place, and time. He appears well-developed and well-nourished.  HENT:  Head: Normocephalic and atraumatic.  Neck: Neck supple.  Cardiovascular: Normal rate and regular rhythm.   Pulmonary/Chest: Effort normal and breath sounds normal. He has no wheezes.  Abdominal: Soft. Bowel sounds are normal. There is no hepatosplenomegaly. There is no tenderness.  Musculoskeletal: He exhibits no edema.  Lymphadenopathy:    He has no cervical adenopathy.  Neurological: He is alert and oriented to person, place, and time.  Skin: Skin is warm and dry.  Psychiatric: He has a normal mood and affect. His behavior is normal.  Vitals reviewed.       Assessment & Plan:   Encounter Diagnoses  Name Primary?  . Gastroesophageal reflux disease, esophagitis presence not specified Yes  . Essential hypertension, benign   . Nicotine dependence, cigarettes, with other nicotine-induced disorders     -rx protonix for GERD.  Discussed nexium but pt preferred to not be on that particular medication -Pt on colestid x 1 mo.  Will recheck lipids in 2 months. -Counseled on smoking cessation, especially as it relates to gerd but also how it relates to his cad, sob, htn and overall health.  Also discussed the expense of smoking is not something that he can afford -f/u 1 mo to recheck bp and effectiveness of ppi.  RTO sooner prn

## 2015-08-15 ENCOUNTER — Other Ambulatory Visit: Payer: Self-pay | Admitting: Physician Assistant

## 2015-08-15 MED ORDER — CLONIDINE HCL 0.1 MG/24HR TD PTWK: 0.1000 mg | MEDICATED_PATCH | TRANSDERMAL | Status: DC

## 2015-09-06 ENCOUNTER — Ambulatory Visit: Payer: Self-pay | Admitting: Physician Assistant

## 2015-09-06 ENCOUNTER — Encounter: Payer: Self-pay | Admitting: Physician Assistant

## 2015-09-06 VITALS — BP 174/96 | HR 99 | Temp 98.2°F | Ht 69.0 in | Wt 193.8 lb

## 2015-09-06 DIAGNOSIS — I10 Essential (primary) hypertension: Secondary | ICD-10-CM

## 2015-09-06 DIAGNOSIS — F17218 Nicotine dependence, cigarettes, with other nicotine-induced disorders: Secondary | ICD-10-CM

## 2015-09-06 DIAGNOSIS — K219 Gastro-esophageal reflux disease without esophagitis: Secondary | ICD-10-CM

## 2015-09-06 DIAGNOSIS — E785 Hyperlipidemia, unspecified: Secondary | ICD-10-CM

## 2015-09-06 MED ORDER — CLONIDINE HCL 0.2 MG/24HR TD PTWK: 0.2000 mg | MEDICATED_PATCH | TRANSDERMAL | Status: DC

## 2015-09-06 NOTE — Patient Instructions (Signed)
Call gastroenterologist to get back in sooner

## 2015-09-06 NOTE — Progress Notes (Signed)
BP 174/96 mmHg  Pulse 99  Temp(Src) 98.2 F (36.8 C)  Ht 5\' 9"  (1.753 m)  Wt 193 lb 12.8 oz (87.907 kg)  BMI 28.61 kg/m2  SpO2 98%   Subjective:    Patient ID: Duane Adams, male    DOB: 1955-08-18, 60 y.o.   MRN: [id number]  HPI: Duane Adams is a 60 y.o. male presenting on 09/06/2015 for Hypertension and Gastroesophageal Reflux   HPI   Pt says protonix okay.  No abd pain.  Pt states every time he eats he gets emesis.    Denies CP, HA, vision changes  Pt is "stressed" about court Monday- hasn't decided whether to plead guilty and go to jail or go to trial  Relevant past medical, surgical, family and social history reviewed and updated as indicated. Interim medical history since our last visit reviewed. Allergies and medications reviewed and updated.  Current outpatient prescriptions:  .  aspirin EC 81 MG tablet, Take 81 mg by mouth daily., Disp: , Rfl:  .  B Complex-Folic Acid (B COMPLEX-VITAMIN B12) TABS, Take 1 tablet by mouth daily., Disp: , Rfl:  .  Choline Fenofibrate 135 MG capsule, Take 135 mg by mouth daily., Disp: , Rfl:  .  cloNIDine (CATAPRES - DOSED IN MG/24 HR) 0.1 mg/24hr patch, Place 1 patch (0.1 mg total) onto the skin once a week. Changes on Saturday., Disp: 4 patch, Rfl: 3 .  clopidogrel (PLAVIX) 75 MG tablet, Take 1 tablet (75 mg total) by mouth daily with breakfast., Disp: 30 tablet, Rfl: 6 .  colestipol (COLESTID) 1 G tablet, Take 2 tablets (2 g total) by mouth 2 (two) times daily., Disp: 120 tablet, Rfl: 4 .  hydrochlorothiazide (HYDRODIURIL) 25 MG tablet, Take 25 mg by mouth daily., Disp: , Rfl:  .  metoprolol succinate (TOPROL-XL) 25 MG 24 hr tablet, Take 25 mg by mouth daily., Disp: , Rfl:  .  Multiple Vitamin (MULTIVITAMIN) tablet, Take 1 tablet by mouth daily., Disp: , Rfl:  .  nitroGLYCERIN (NITROSTAT) 0.4 MG SL tablet, Place 1 tablet (0.4 mg total) under the tongue every 5 (five) minutes as needed for chest pain., Disp: 25 tablet, Rfl: 3 .   Omega-3 Fatty Acids (FISH OIL) 1000 MG CAPS, Take 2,400 mg by mouth., Disp: , Rfl:  .  pantoprazole (PROTONIX) 40 MG tablet, Take 1 tablet (40 mg total) by mouth daily., Disp: 30 tablet, Rfl: 1 .  sucralfate (CARAFATE) 1 g tablet, Take 1 tablet (1 g total) by mouth 4 (four) times daily -  before meals and at bedtime. (take 1 hour before meal), Disp: 120 tablet, Rfl: 1   Review of Systems  Constitutional: Positive for fatigue. Negative for fever, chills, diaphoresis, appetite change and unexpected weight change.  HENT: Positive for dental problem. Negative for congestion, drooling, ear pain, facial swelling, hearing loss, mouth sores, sneezing, sore throat, trouble swallowing and voice change.   Eyes: Positive for itching. Negative for pain, discharge, redness and visual disturbance.  Respiratory: Positive for cough, shortness of breath and wheezing. Negative for choking.   Cardiovascular: Negative for chest pain, palpitations and leg swelling.  Gastrointestinal: Positive for vomiting and abdominal pain. Negative for diarrhea, constipation and blood in stool.  Endocrine: Negative for cold intolerance, heat intolerance and polydipsia.  Genitourinary: Negative for dysuria, hematuria and decreased urine volume.  Musculoskeletal: Positive for back pain and gait problem. Negative for arthralgias.  Skin: Negative for rash.  Allergic/Immunologic: Negative for environmental allergies.  Neurological: Negative  for seizures, syncope, light-headedness and headaches.  Hematological: Negative for adenopathy.  Psychiatric/Behavioral: Positive for dysphoric mood and agitation. Negative for suicidal ideas. The patient is nervous/anxious.     Per HPI unless specifically indicated above     Objective:    BP 174/96 mmHg  Pulse 99  Temp(Src) 98.2 F (36.8 C)  Ht 5\' 9"  (1.753 m)  Wt 193 lb 12.8 oz (87.907 kg)  BMI 28.61 kg/m2  SpO2 98%  Wt Readings from Last 3 Encounters:  09/06/15 193 lb 12.8 oz  (87.907 kg)  08/14/15 195 lb 3.2 oz (88.542 kg)  07/26/15 191 lb (86.637 kg)    Physical Exam  Constitutional: He is oriented to person, place, and time. He appears well-developed and well-nourished.  HENT:  Head: Normocephalic and atraumatic.  Neck: Neck supple.  Cardiovascular: Normal rate and regular rhythm.   Pulmonary/Chest: Effort normal and breath sounds normal. He has no wheezes.  Abdominal: Soft. Bowel sounds are normal. There is no hepatosplenomegaly. There is no tenderness.  Musculoskeletal: He exhibits no edema.  Lymphadenopathy:    He has no cervical adenopathy.  Neurological: He is alert and oriented to person, place, and time.  Skin: Skin is warm and dry.  Psychiatric: He has a normal mood and affect. His behavior is normal.  Vitals reviewed.       Assessment & Plan:   Encounter Diagnoses  Name Primary?  . Essential hypertension, benign Yes  . Gastroesophageal reflux disease, esophagitis presence not specified   . Nicotine dependence, cigarettes, with other nicotine-induced disorders   . Hyperlipidemia     -Increase clonidine patch to 0.2mg  -pt to call and GI and get in for GI issues sooner than June -pt has cardiology appointment in March -counseled on smoking cessation -F/u 1 mo to recheck bp and recheck cholesterol before that appointment

## 2015-09-13 ENCOUNTER — Ambulatory Visit: Payer: Medicaid Other | Admitting: Cardiology

## 2015-10-03 ENCOUNTER — Ambulatory Visit: Payer: Self-pay | Admitting: Physician Assistant

## 2015-10-03 NOTE — Progress Notes (Signed)
APPT MADE

## 2015-10-03 NOTE — Progress Notes (Signed)
Patient overdue for follow-up and labs. Please arrange visit.

## 2015-10-18 ENCOUNTER — Ambulatory Visit: Payer: Medicaid Other | Admitting: Cardiology

## 2016-01-08 ENCOUNTER — Ambulatory Visit: Payer: Medicaid Other | Admitting: Gastroenterology

## 2016-04-20 IMAGING — DX DG CHEST 2V
2 series · 2 of 2 positions shown · non-contrast
Comparison: Chest radiograph September 26, 2009; chest CT March 02, 2013

CLINICAL DATA: Chronic shortness of breath.  Hypertension

EXAM:
CHEST  2 VIEW

[chest pa]
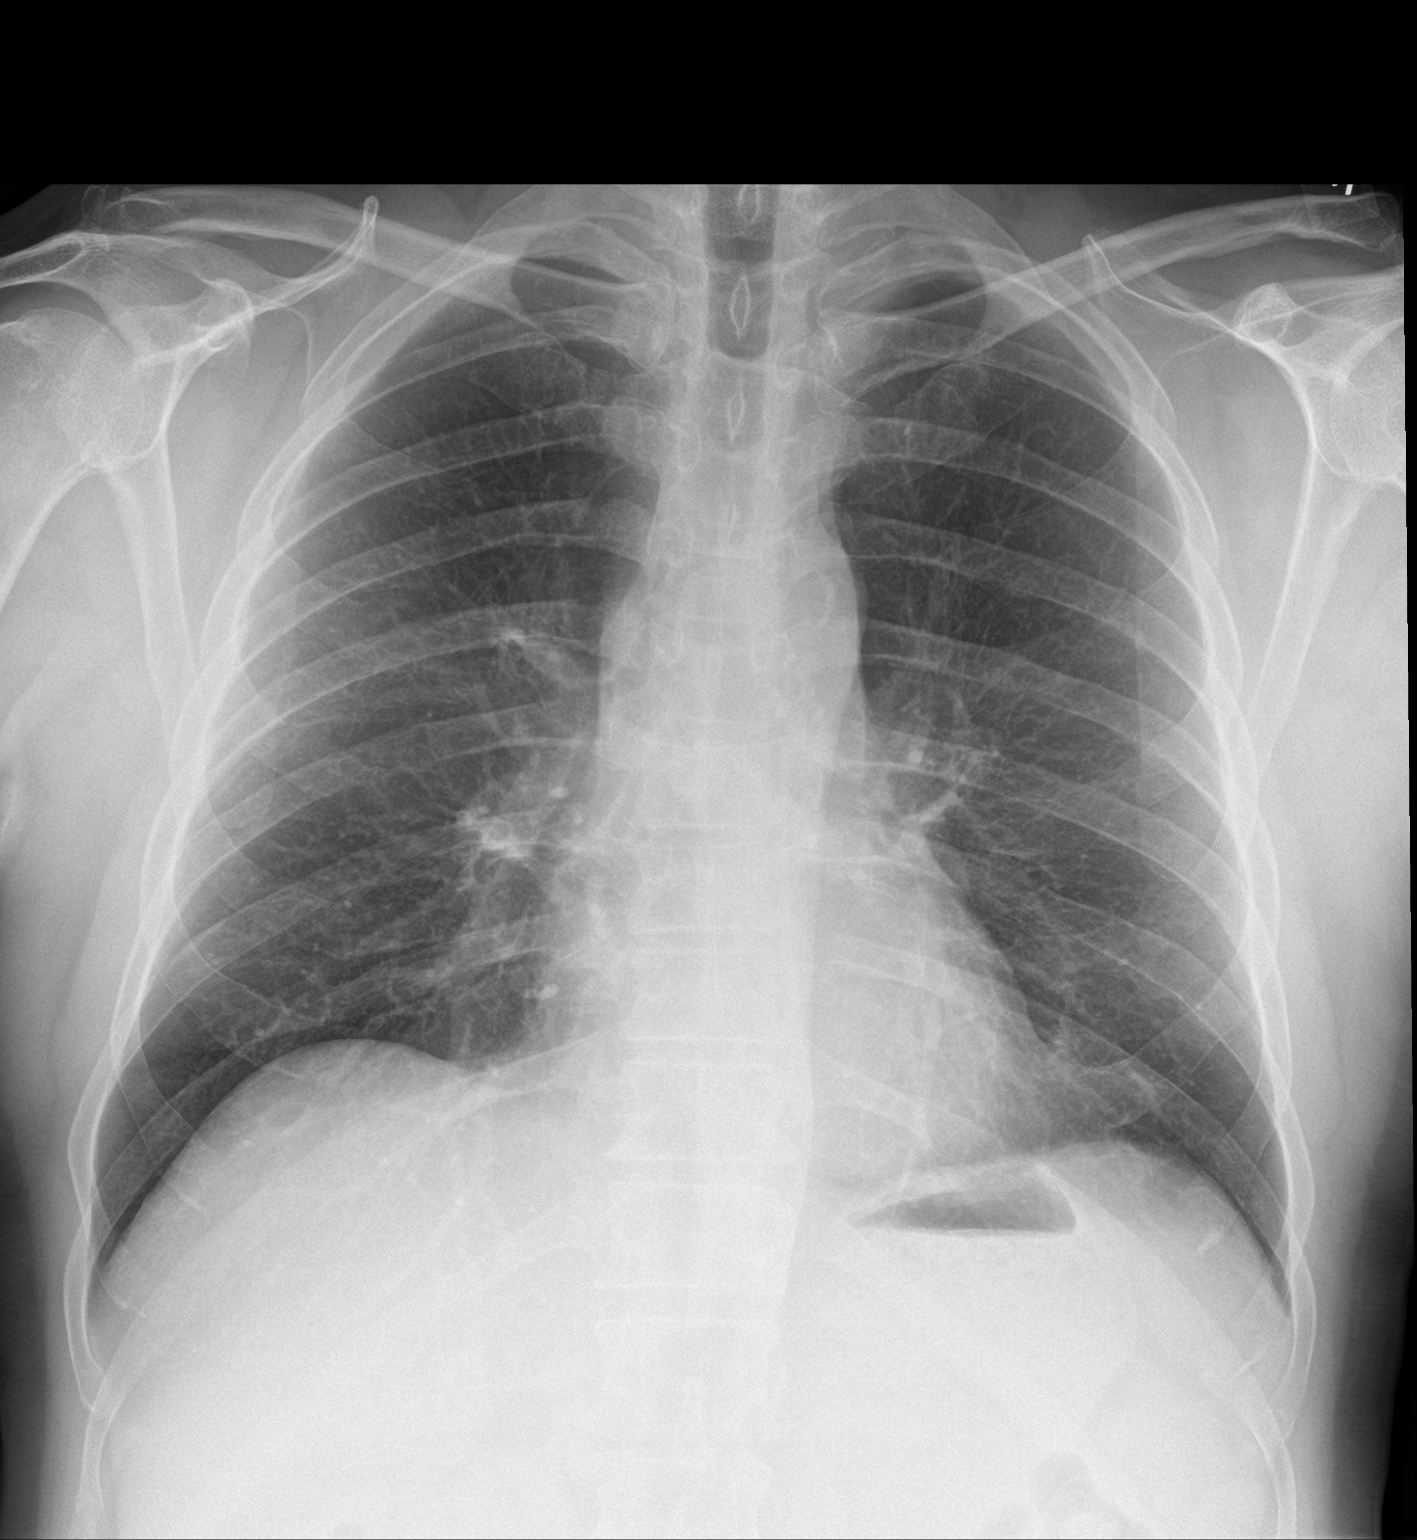

[chest lat]
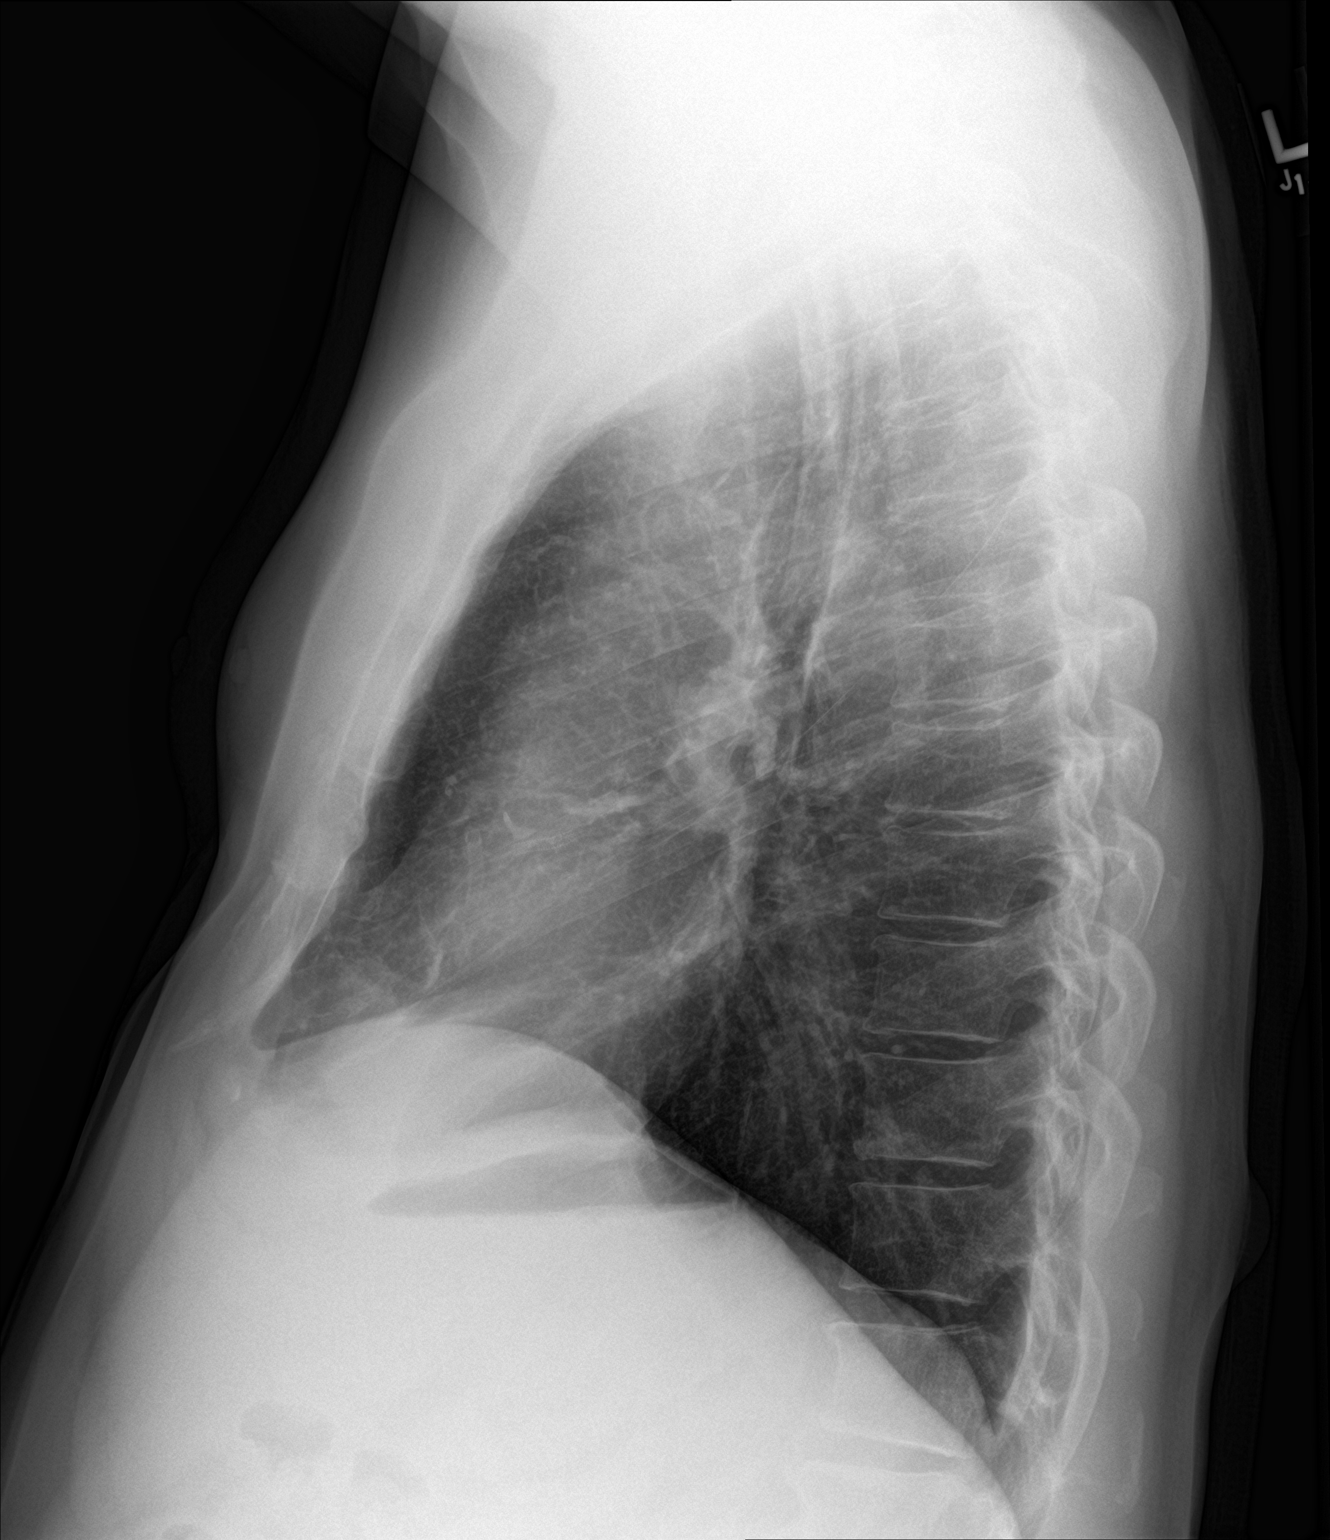

[2 of 2 positions shown; findings below may reference images not displayed]

FINDINGS: There is no edema or consolidation. The heart size and pulmonary
vascularity are normal. No adenopathy. There are rudimentary
cervical ribs bilaterally.
IMPRESSION: No edema or consolidation.

## 2016-08-13 IMAGING — US US ABDOMEN COMPLETE W/ ELASTOGRAPHY
1 series · 13 of 25 positions shown · non-contrast
Comparison: None

CLINICAL DATA: Hepatic cirrhosis of unspecified type, history
smoking, hepatitis, essential benign hypertension, mixed
hyperlipidemia, coronary artery disease



[Series 1: us abdomen complete w/ elastography · 0.12mm/px · 13 of 31 slices shown]
[im 1/31]
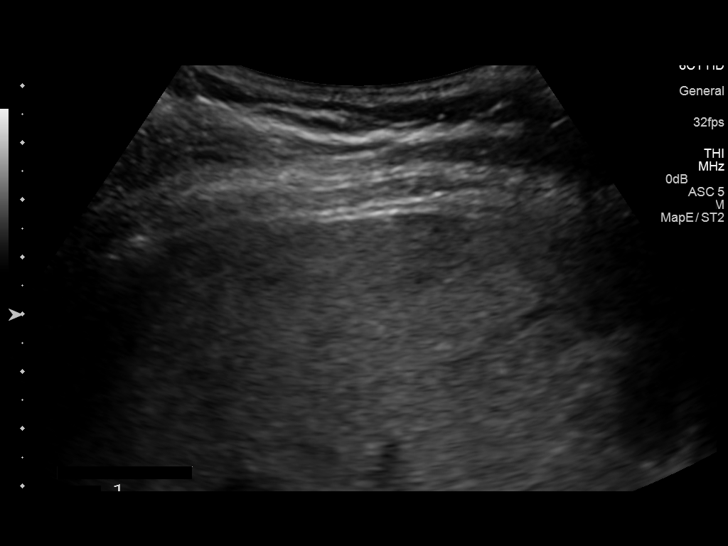
[im 3/31]
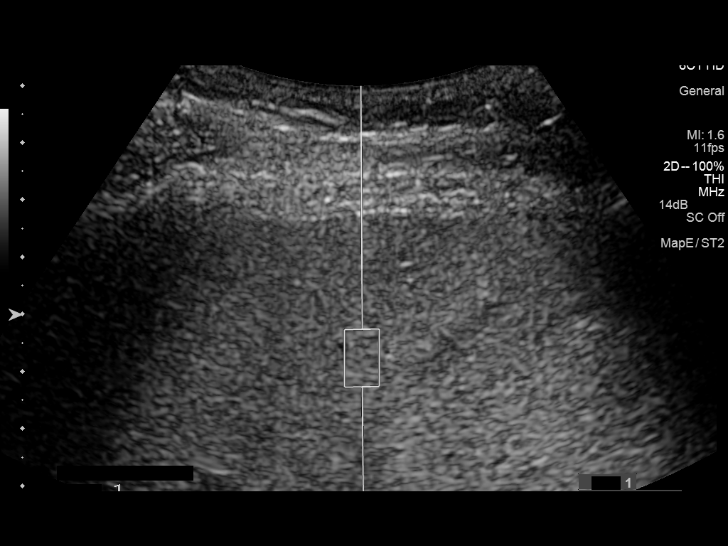
[im 6/31]
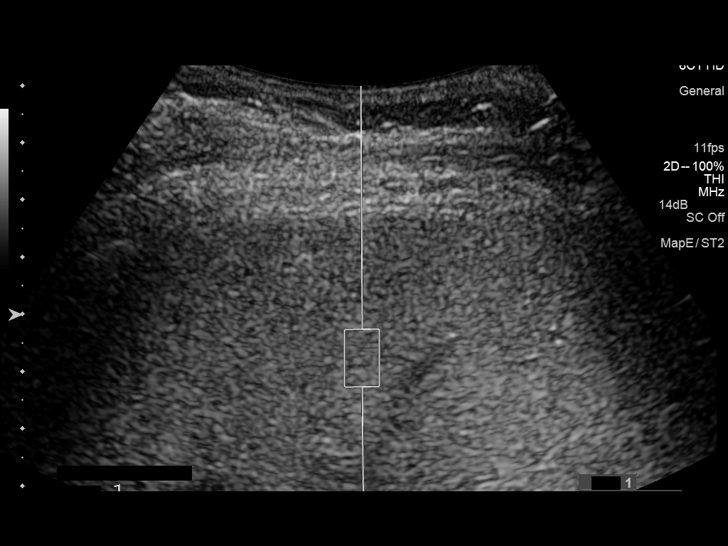
[im 8/31]
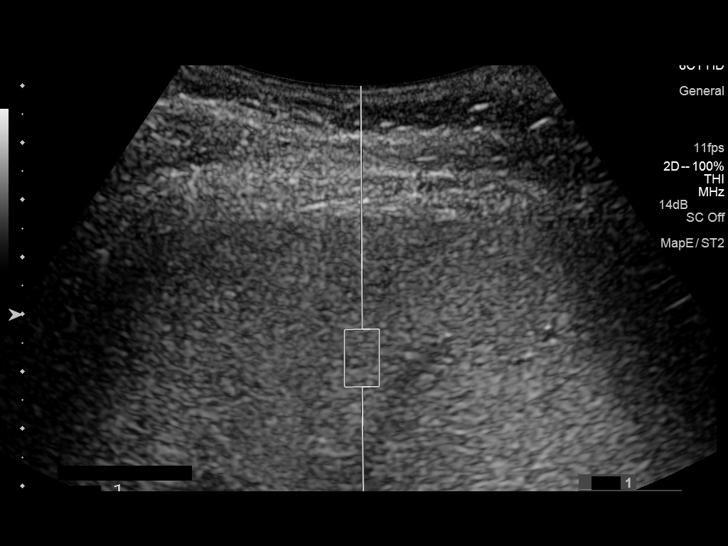
[im 11/31]
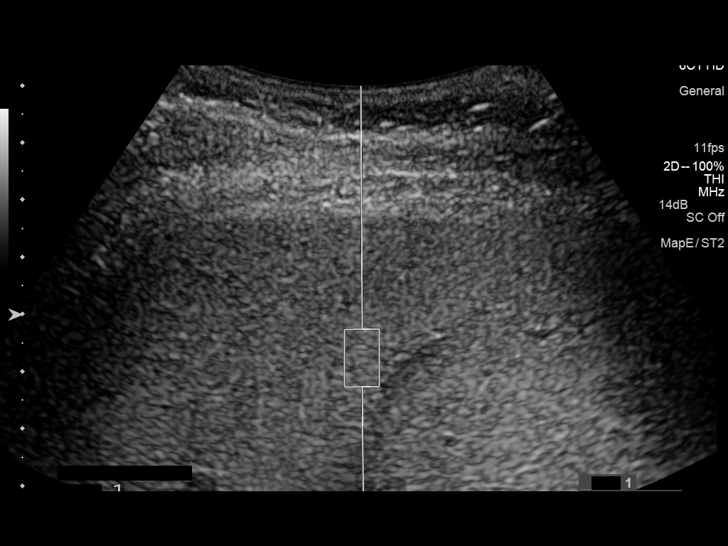
[im 13/31]
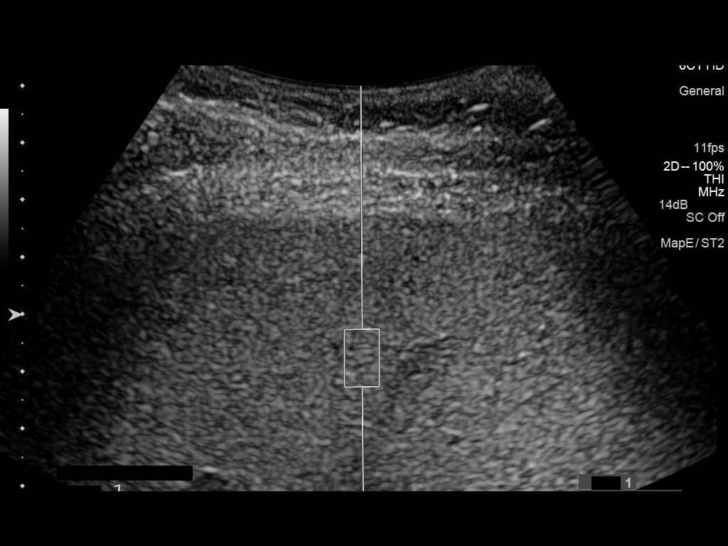
[im 16/31]
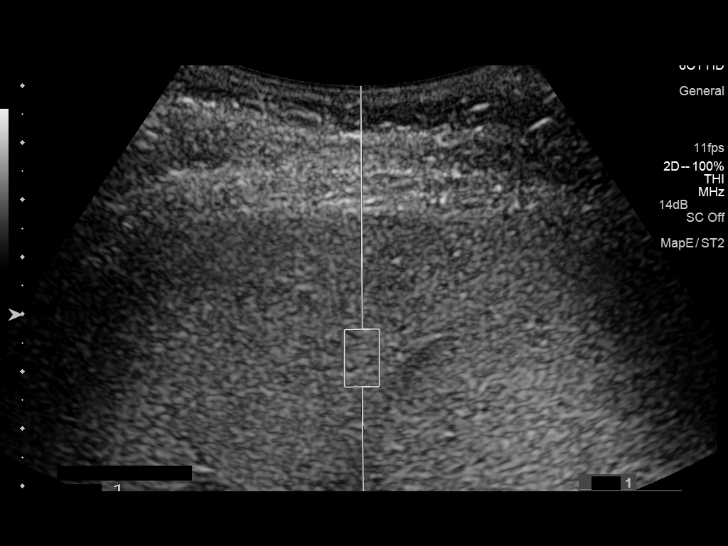
[im 18/31]
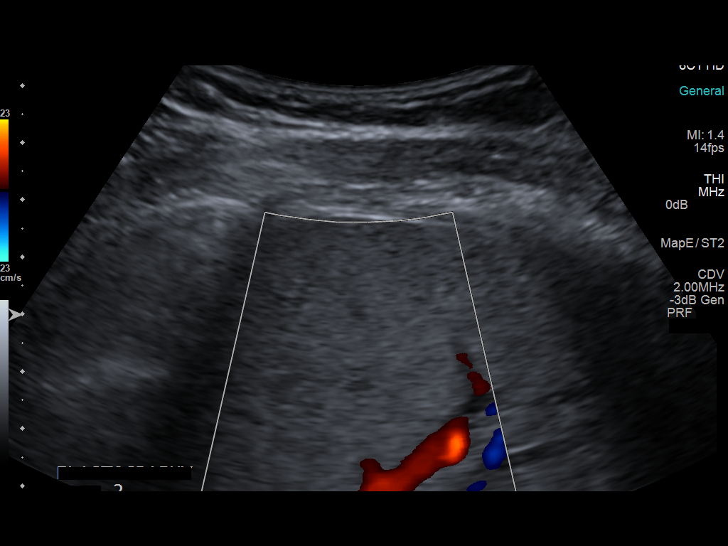
[im 21/31]
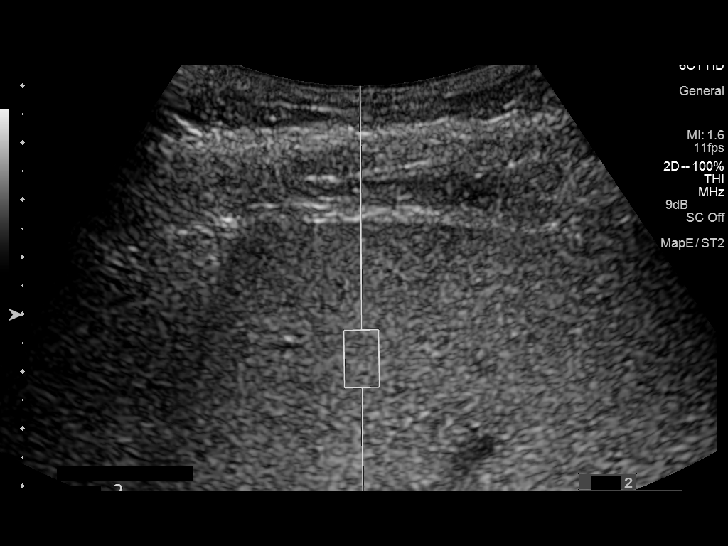
[im 23/31]
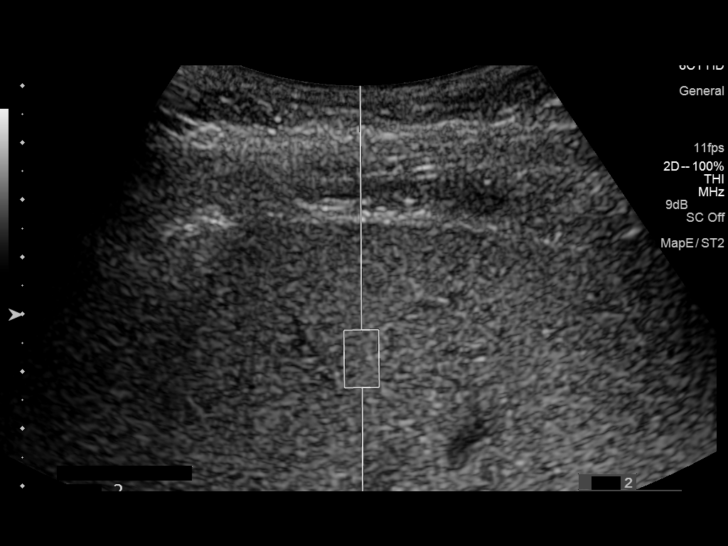
[im 26/31]
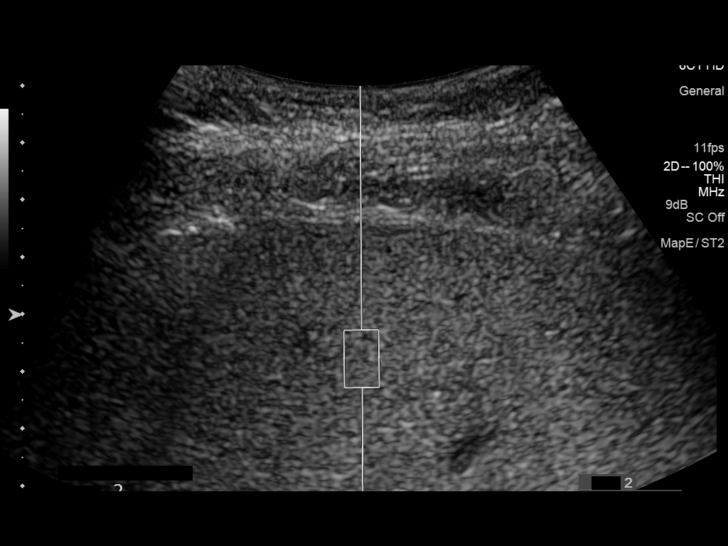
[im 28/31]
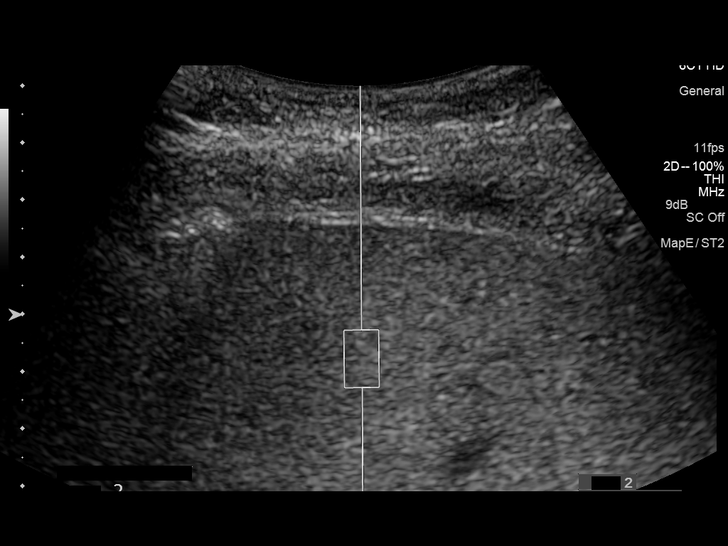
[im 31/31]
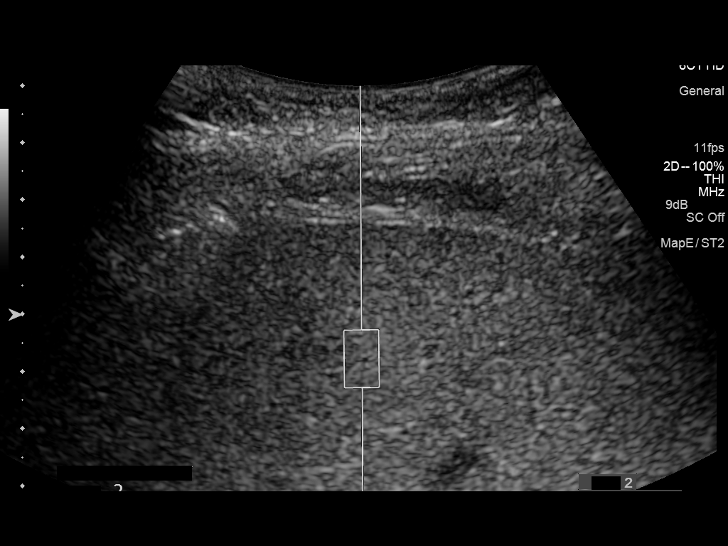

[13 of 25 positions shown; findings below may reference images not displayed]

FINDINGS: ULTRASOUND ABDOMEN

Gallbladder: Normally distended without stones or wall thickening.

No pericholecystic fluid or sonographic Murphy sign.

Common bile duct: Diameter: Normal caliber 3 mm diameter

Liver: Increased hepatic echogenicity. No definite focal hepatic
mass or nodularity. Hepatopetal portal venous flow.

IVC: Normal appearance

Pancreas: Normal appearance

Spleen: Normal size and morphology, 9.0 cm length. Several tiny
calcified granulomas.

Right Kidney: Length: 11.3 cm. Normal cortical thickness and
echogenicity. Small exophytic cyst mid RIGHT kidney 17 x 15 x 15 mm.
No solid mass or hydronephrosis.

Left Kidney: Length: 11.2 cm. Normal morphology without mass or
hydronephrosis.

Abdominal aorta: Small focal zone of increased diameter at the mid
abdominal aorta though only achieving a greatest size 2.1 cm. No
definite aneurysm.

Other findings: No free-fluid

ULTRASOUND HEPATIC ELASTOGRAPHY

Device: Siemens Helix VTQ

Transducer 6C1

Patient position:  LEFT lateral decubitus

Number of measurements:  10

Hepatic Segment:  8

Median velocity:   1.76  m/sec

IQR:

IQR/Median velocity ratio

Corresponding Metavir fibrosis score:  F2 & some F3

Risk of fibrosis: moderate

Limitations of exam: None

Pertinent findings noted on other imaging exams:  None

Please note that abnormal shear wave velocities may also be
identified in clinical settings other than with hepatic fibrosis,
such as: acute hepatitis, elevated right heart and central venous
pressures including use of beta blockers, [place] disease
([place]), infiltrative processes such as
mastocytosis/amyloidosis/infiltrative tumor, extrahepatic
cholestasis, in the post-prandial state, and liver transplantation.
Correlation with patient history, laboratory data, and clinical
condition recommended.
IMPRESSION: Echogenic liver which can be seen with fatty infiltration and
cirrhosis.

Small RIGHT renal cyst.

No additional abnormalities.

Median hepatic shear wave velocity is calculated at 1.76 m/sec.

Corresponding Metavir fibrosis score is F2 & some F3.

Risk of fibrosis is Moderate.

Follow-up:  Additional testing appropriate.

## 2016-09-24 ENCOUNTER — Ambulatory Visit: Payer: Self-pay | Admitting: Physician Assistant

## 2016-09-24 ENCOUNTER — Encounter: Payer: Self-pay | Admitting: Physician Assistant

## 2016-09-24 VITALS — BP 128/70 | HR 82 | Temp 98.2°F | Ht 67.25 in | Wt 171.0 lb

## 2016-09-24 DIAGNOSIS — R079 Chest pain, unspecified: Secondary | ICD-10-CM

## 2016-09-24 DIAGNOSIS — E785 Hyperlipidemia, unspecified: Secondary | ICD-10-CM

## 2016-09-24 DIAGNOSIS — F17218 Nicotine dependence, cigarettes, with other nicotine-induced disorders: Secondary | ICD-10-CM

## 2016-09-24 DIAGNOSIS — Z131 Encounter for screening for diabetes mellitus: Secondary | ICD-10-CM

## 2016-09-24 DIAGNOSIS — I25119 Atherosclerotic heart disease of native coronary artery with unspecified angina pectoris: Secondary | ICD-10-CM

## 2016-09-24 DIAGNOSIS — I10 Essential (primary) hypertension: Secondary | ICD-10-CM

## 2016-09-24 LAB — GLUCOSE, POCT (MANUAL RESULT ENTRY): POC GLUCOSE: 107 mg/dL — AB (ref 70–99)

## 2016-09-24 NOTE — Patient Instructions (Addendum)
Get fasting labs drawn this week.    Call your GI doctor to see if you need follow up  Apply for medicaid  Turn in [employer] discount application  We will refer you to cardiology/someone will call you for appointment  Stop smoking

## 2016-09-24 NOTE — Progress Notes (Signed)
BP 128/70 (BP Location: Left Arm, Patient Position: Sitting, Cuff Size: Normal)   Pulse 82   Temp 98.2 F (36.8 C)   Ht 5' 7.25" (1.708 m)   Wt 171 lb (77.6 kg)   SpO2 98%   BMI 26.58 kg/m    Subjective:    Patient ID: Duane Adams, male    DOB: 1955/10/24, 61 y.o.   MRN: [id number]  HPI: Duane Adams is a 61 y.o. male presenting on 09/24/2016 for New Patient (Initial Visit)   HPI  Returning pt-   Pt just got out of prison last week.    Pt didn't smoke for a year while in jail but he has started smoking 1-2 daily since getting out.    Pt went to hospital 2/28/[facility]- had [place] 09/10/16- due to exertional CP- LAD stent patent, RCA stent patent  Labs reviewed from 09/10/16- CBC, BMP- normal  Pt still having exertional CP.  He wants to go back on his isosorbide.  He says the prison started it 2 or 3 months ago.   Relevant past medical, surgical, family and social history reviewed and updated as indicated. Interim medical history since our last visit reviewed. Allergies and medications reviewed and updated.   Current Outpatient Prescriptions:  .  aspirin EC 81 MG tablet, Take 81 mg by mouth daily., Disp: , Rfl:  .  atorvastatin (LIPITOR) 10 MG tablet, Take 10 mg by mouth daily., Disp: , Rfl:  .  Cholestyramine POWD, by Does not apply route daily., Disp: , Rfl:  .  cloNIDine (CATAPRES) 0.1 MG tablet, Take 0.1 mg by mouth 2 (two) times daily., Disp: , Rfl:  .  clopidogrel (PLAVIX) 75 MG tablet, Take 1 tablet (75 mg total) by mouth daily with breakfast., Disp: 30 tablet, Rfl: 6 .  hydrochlorothiazide (HYDRODIURIL) 25 MG tablet, Take 25 mg by mouth daily., Disp: , Rfl:  .  metoprolol succinate (TOPROL-XL) 25 MG 24 hr tablet, Take 50 mg by mouth daily. , Disp: , Rfl:  .  Multiple Vitamin (MULTIVITAMIN) tablet, Take 1 tablet by mouth daily., Disp: , Rfl:  .  nitroGLYCERIN (NITROSTAT) 0.4 MG SL tablet, Place 1 tablet (0.4 mg total) under the tongue every 5 (five) minutes as  needed for chest pain., Disp: 25 tablet, Rfl: 3   Review of Systems  Constitutional: Positive for appetite change and fatigue. Negative for chills, diaphoresis, fever and unexpected weight change.  HENT: Positive for dental problem and hearing loss. Negative for congestion, drooling, ear pain, facial swelling, mouth sores, sneezing, sore throat, trouble swallowing and voice change.   Eyes: Negative for pain, discharge, redness, itching and visual disturbance.  Respiratory: Positive for shortness of breath. Negative for cough, choking and wheezing.   Cardiovascular: Positive for chest pain and palpitations. Negative for leg swelling.  Gastrointestinal: Negative for abdominal pain, blood in stool, constipation, diarrhea and vomiting.  Endocrine: Negative for cold intolerance, heat intolerance and polydipsia.  Genitourinary: Negative for decreased urine volume, dysuria and hematuria.  Musculoskeletal: Positive for gait problem. Negative for arthralgias and back pain.  Skin: Negative for rash.  Allergic/Immunologic: Negative for environmental allergies.  Neurological: Positive for headaches. Negative for seizures, syncope and light-headedness.  Hematological: Negative for adenopathy.  Psychiatric/Behavioral: Positive for agitation and dysphoric mood. Negative for suicidal ideas. The patient is nervous/anxious.     Per HPI unless specifically indicated above     Objective:    BP 128/70 (BP Location: Left Arm, Patient Position: Sitting, Cuff Size:  Normal)   Pulse 82   Temp 98.2 F (36.8 C)   Ht 5' 7.25" (1.708 m)   Wt 171 lb (77.6 kg)   SpO2 98%   BMI 26.58 kg/m   Wt Readings from Last 3 Encounters:  09/24/16 171 lb (77.6 kg)  09/23/16 172 lb 12.8 oz (78.4 kg)  09/06/15 193 lb 12.8 oz (87.9 kg)    Physical Exam  Constitutional: He is oriented to person, place, and time. He appears well-developed and well-nourished.  HENT:  Head: Normocephalic and atraumatic.  Neck: Neck supple.   Cardiovascular: Normal rate and regular rhythm.   Pulmonary/Chest: Effort normal and breath sounds normal. He has no wheezes.  Abdominal: Soft. Bowel sounds are normal. He exhibits no distension, no fluid wave and no ascites. There is no hepatosplenomegaly. There is no tenderness. There is no rigidity, no rebound and no guarding. A hernia is present.  Musculoskeletal: He exhibits no edema.  Lymphadenopathy:    He has no cervical adenopathy.  Neurological: He is alert and oriented to person, place, and time.  Skin: Skin is warm and dry.  Psychiatric: He has a normal mood and affect. His behavior is normal.  Vitals reviewed.        Assessment & Plan:    Encounter Diagnoses  Name Primary?  . Screening for diabetes mellitus (DM) Yes  . Essential hypertension, benign   . Coronary artery disease involving native coronary artery of native heart with angina pectoris ([place])   . Chest pain, unspecified type   . Nicotine dependence, cigarettes, with other nicotine-induced disorders   . Hyperlipidemia, unspecified hyperlipidemia type     -Pt given [employer] discount application -Pt to go to medicaid office and apply -Check lipids -Refer to cardiology for chest pain -No medicine changes today -pt counseled smoking cessation -pt to contact GI office to see if he needs to follow up (he doesn't know- had just completed hepatitis treatment prior to incarceration) -pt to follow up 1 month.  RTO sooner prn

## 2016-09-24 NOTE — Congregational Nurse Program (Signed)
Congregational Nurse Program Note  Date of Encounter: 09/23/2016  Past Medical History: Past Medical History:  Diagnosis Date  . Coronary artery disease    a. 03/2013 Cath/PCI: LM nl, LAD 71m (2.5x20 Promus Premier DES), LCX min irregs, RCA dom, 90p(3.0x20 Promus Premier DES), EF 55-65%.  . Essential hypertension, benign   . Helicobacter pylori gastritis JUN 2016 EGD/Bx   PREVPAK BID FOR 14 DAYS  . Hepatitis    Hepatitis C  . Mixed hyperlipidemia   . Skin cancer   . Tobacco abuse     Encounter Details:     CNP Questionnaire - 09/23/16 1530      Patient Demographics   Is this a new or existing patient? New   Patient is considered a/an Not Applicable   Race Caucasian/White     Patient Assistance   Location of Patient [place]   Patient's financial/insurance status Low Income;Self-Pay (Uninsured)   Uninsured Patient ([place]) Yes   Interventions Counseled to make appt. with provider;Assisted patient in making appt.   Patient referred to apply for the following financial assistance Medicaid   Transportation assistance No   Assistance securing medications No   Educational health offerings Chronic disease;Cardiac disease;Hypertension;Medications;Navigating the healthcare system     Encounter Details   Primary purpose of visit Chronic Illness/Condition Visit;Education/Health Concerns;Navigating the Healthcare System   Was an Emergency Department visit averted? Not Applicable   Does patient have a medical provider? No   Patient referred to Area Agency;Clinic;Establish PCP   Was a mental health screening completed? (GAINS tool) No   Does patient have dental issues? No   Does patient have vision issues? No   Does your patient have an abnormal blood pressure today? Yes   Since previous encounter, have you referred patient for abnormal blood pressure that resulted in a new diagnosis or medication change? No   Does your patient have an abnormal  blood glucose today? No   Since previous encounter, have you referred patient for abnormal blood glucose that resulted in a new diagnosis or medication change? No   Was there a life-saving intervention made? No      New client to [facility] , but previous client of the [employer]. Client previously a patient of the [facility] [place], but has been incarcerated for about a year. Client is here today for help navigating back into a primary Care Provider. He states currently he does not have medicaid. RN encouraged client to reapply for medicaid. He has no income and is living with a "girlfriend".  Client states when he was released from [place], he was given 30 days of some of his medications and some he was not. Current meds per client: Clonidine ? 0.1mg  twice daily Metoprolol 25 mg once daily  Amlodipine ? Dose (has none) Plavix 75 mg daily  Aspirin 81 mg daily Atorvastatin 10 mg Isosorbide unknown dose (out of) Nitrostat  Past medical history: History of Coronary cath with stents x2 placed Hand reattachment left hand. Polyp removal, skin cancer  Allergies: lisinopril swelling of throat  Client states he has had ongoing chest pains midsternal "ache" daily and while in prison. Client states at times it is worse than others. Client states he had chest pains while in [facility] and was given a coronary cath but he was not told or aware of results. Today he reports midsternal pain "ache" rates it as a 1 or 2 out of 10 pain scale ,  denies shortness of breath currently nor nausea , no diaphoresis. Client states he is unsure if its his "hiatal hernia" or "heart pains" client states he had worsening chest pains last Friday and took his nitrostat with no relief. RN encouraged client to go to the emergency room today for evaluation, that we are unable to treat chest pain at [facility] nor do [facility]. Client refuses to be transported or to go to the Emergency Room. Client states  " I will go if I feel i'm getting worse"  Vital signs today 149/89 pulse regular at 73 oxygen saturation 97% and respirations 12 and non labored. Client walked from apartment today with girlfriend. RN reviewed nitrostat use and to call 911 for unrelieved or increased chest pains , shortness of breath, diaphoresis , nausea and vomiting. Client states he understands and states he knows how to use his nitrostat and when to call 911. This information also given to his girlfriend with client's permission.  Client desires to be referred back into the [facility] and appointment ASAP secured for 09/24/16 at 1:45 pm RN again recommended client to go to the emergency room for evaluation of ongoing continuous chest pains, client states he will not go now, but will if he feels worse. Client states understanding of risks And discussed with his permission with his girlfriend also. She also made aware to call 911 if needed and not to attempt to transport client in her vehicle.  At conclusion of screening client still reports midsternal ache of 2/10 pain on a 0 to 10 scale.  NOTE: Client states he has been without his isosorbide and some other medications for at least 1 week.   Will follow as needed and follow up after initial appointment with the free clinic on 09/24/16.

## 2016-09-29 LAB — COMPREHENSIVE METABOLIC PANEL
ALK PHOS: 41 U/L (ref 40–115)
ALT: 18 U/L (ref 9–46)
AST: 18 U/L (ref 10–35)
Albumin: 4.4 g/dL (ref 3.6–5.1)
BUN: 14 mg/dL (ref 7–25)
CALCIUM: 9.7 mg/dL (ref 8.6–10.3)
CO2: 26 mmol/L (ref 20–31)
Chloride: 105 mmol/L (ref 98–110)
Creat: 0.9 mg/dL (ref 0.70–1.33)
GLUCOSE: 106 mg/dL — AB (ref 65–99)
POTASSIUM: 4.2 mmol/L (ref 3.5–5.3)
Sodium: 138 mmol/L (ref 135–146)
Total Bilirubin: 0.3 mg/dL (ref 0.2–1.2)
Total Protein: 7.3 g/dL (ref 6.1–8.1)

## 2016-09-29 LAB — LIPID PANEL
CHOL/HDL RATIO: 3.8 ratio (ref <5.0)
Cholesterol: 176 mg/dL (ref <200)
HDL: 46 mg/dL (ref >40)
LDL Cholesterol: 89 mg/dL (ref <100)
TRIGLYCERIDES: 204 mg/dL — AB (ref <150)
VLDL: 41 mg/dL — ABNORMAL HIGH (ref <30)

## 2016-10-08 ENCOUNTER — Telehealth: Payer: Self-pay

## 2016-10-08 NOTE — Telephone Encounter (Signed)
Called client to follow up post [facility] appointment. No answer left message for client to call RN back.

## 2016-10-20 ENCOUNTER — Encounter: Payer: Self-pay | Admitting: Physician Assistant

## 2016-10-20 ENCOUNTER — Emergency Department (HOSPITAL_COMMUNITY)
Admission: EM | Admit: 2016-10-20 | Discharge: 2016-10-20 | Disposition: A | Discharge status: Home / Self Care | Payer: Medicaid Other | Attending: Emergency Medicine | Admitting: Emergency Medicine

## 2016-10-20 ENCOUNTER — Telehealth: Payer: Self-pay

## 2016-10-20 ENCOUNTER — Encounter (HOSPITAL_COMMUNITY): Payer: Self-pay | Admitting: *Deleted

## 2016-10-20 ENCOUNTER — Encounter: Payer: Self-pay | Admitting: Gastroenterology

## 2016-10-20 ENCOUNTER — Ambulatory Visit (INDEPENDENT_AMBULATORY_CARE_PROVIDER_SITE_OTHER): Payer: Self-pay | Admitting: Gastroenterology

## 2016-10-20 VITALS — BP 197/101 | HR 103 | Temp 97.9°F | Ht 68.0 in | Wt 179.4 lb

## 2016-10-20 DIAGNOSIS — Z7982 Long term (current) use of aspirin: Secondary | ICD-10-CM | POA: Insufficient documentation

## 2016-10-20 DIAGNOSIS — Z85828 Personal history of other malignant neoplasm of skin: Secondary | ICD-10-CM | POA: Diagnosis not present

## 2016-10-20 DIAGNOSIS — F17210 Nicotine dependence, cigarettes, uncomplicated: Secondary | ICD-10-CM | POA: Insufficient documentation

## 2016-10-20 DIAGNOSIS — I2510 Atherosclerotic heart disease of native coronary artery without angina pectoris: Secondary | ICD-10-CM | POA: Diagnosis not present

## 2016-10-20 DIAGNOSIS — K635 Polyp of colon: Secondary | ICD-10-CM

## 2016-10-20 DIAGNOSIS — I10 Essential (primary) hypertension: Secondary | ICD-10-CM

## 2016-10-20 DIAGNOSIS — B182 Chronic viral hepatitis C: Secondary | ICD-10-CM

## 2016-10-20 DIAGNOSIS — Z79899 Other long term (current) drug therapy: Secondary | ICD-10-CM | POA: Insufficient documentation

## 2016-10-20 LAB — BASIC METABOLIC PANEL
Anion gap: 10 (ref 5–15)
BUN: 13 mg/dL (ref 6–20)
CALCIUM: 9.4 mg/dL (ref 8.9–10.3)
CO2: 26 mmol/L (ref 22–32)
CREATININE: 0.81 mg/dL (ref 0.61–1.24)
Chloride: 101 mmol/L (ref 101–111)
Glucose, Bld: 99 mg/dL (ref 65–99)
Potassium: 3.9 mmol/L (ref 3.5–5.1)
SODIUM: 137 mmol/L (ref 135–145)

## 2016-10-20 LAB — CBC
HCT: 42.7 % (ref 39.0–52.0)
Hemoglobin: 14.5 g/dL (ref 13.0–17.0)
MCH: 31 pg (ref 26.0–34.0)
MCHC: 34 g/dL (ref 30.0–36.0)
MCV: 91.2 fL (ref 78.0–100.0)
PLATELETS: 194 10*3/uL (ref 150–400)
RBC: 4.68 MIL/uL (ref 4.22–5.81)
RDW: 14.4 % (ref 11.5–15.5)
WBC: 9.5 10*3/uL (ref 4.0–10.5)

## 2016-10-20 LAB — TROPONIN I

## 2016-10-20 MED ORDER — METOPROLOL SUCCINATE ER 50 MG PO TB24
50.0000 mg | ORAL_TABLET | Freq: Every day | ORAL | Status: DC
  Administered 2016-10-20: 50 mg via ORAL
  Filled 2016-10-20 (×3): qty 1

## 2016-10-20 MED ORDER — PANTOPRAZOLE SODIUM 40 MG PO TBEC: 40.0000 mg | DELAYED_RELEASE_TABLET | Freq: Every day | ORAL | 3 refills | Status: DC

## 2016-10-20 MED ORDER — HYDROCHLOROTHIAZIDE 25 MG PO TABS
25.0000 mg | ORAL_TABLET | Freq: Every day | ORAL | Status: DC
  Administered 2016-10-20: 25 mg via ORAL
  Filled 2016-10-20: qty 1

## 2016-10-20 MED ORDER — CLONIDINE HCL 0.1 MG PO TABS
0.1000 mg | ORAL_TABLET | Freq: Two times a day (BID) | ORAL | Status: DC
  Administered 2016-10-20: 0.1 mg via ORAL
  Filled 2016-10-20: qty 1

## 2016-10-20 NOTE — ED Notes (Signed)
Pt verbalized understanding of discharge instructions. Pt ambulatory to waiting room.  

## 2016-10-20 NOTE — ED Triage Notes (Signed)
Pt was seen at [facility] yesterday and was given refills for BP medication. Pt went to pick up medication today and refill wasn't sent in. BP 198/90 at doctor's office today and was told to come to ED. Pt reports seeing spots in his eyes. Denies head pain. BP meds last taken yesterday.

## 2016-10-20 NOTE — Discharge Instructions (Signed)
Continue your blood pressure medications as planned. Return to emergency  for chest pain shortness of breath or other concerning symptoms

## 2016-10-20 NOTE — Telephone Encounter (Signed)
PA info for [place] abd complete with elastography submitted via [employer]. Case pending. Additional clinical info faxed to [place]. Service order: [id number].

## 2016-10-20 NOTE — ED Provider Notes (Signed)
[place] DEPT Provider Note   CSN: [id number] Arrival date & time: 10/20/16  1530     History   Chief Complaint Chief Complaint  Patient presents with  . Hypertension    HPI Duane Adams is a 61 y.o. male.  HPI Patient has a history of hypertension. [facility] He was seen yesterday and was told his prescriptions were going to be called in to [facility]. Patient went to the pharmacy today to pick up his medications and they were not available. The patient had a follow-up appointment at a GI doctor's office today. His blood pressure was 198/90. He was instructed to come to the emergency room for evaluation. Patient denies any trouble with any headache. No shortness of breath. He does have a history of heart disease and intermittently has trouble with chest pain but denies any active pain today. He denies any trouble with leg swelling. No numbness, weakness or confusion. Patient was told by his clinic that he will be able to get his medications tomorrow. Past Medical History:  Diagnosis Date  . Anxiety   . Cirrhosis ([place])   . Coronary artery disease    a. 03/2013 Cath/PCI: LM nl, LAD 27m (2.5x20 Promus Premier DES), LCX min irregs, RCA dom, 90p(3.0x20 Promus Premier DES), EF 55-65%.  . Essential hypertension, benign   . Helicobacter pylori gastritis JUN 2016 EGD/Bx   PREVPAK BID FOR 14 DAYS  . Hepatitis    Hepatitis C treated   . Hiatal hernia   . Mixed hyperlipidemia   . Skin cancer   . Tobacco abuse     Patient Active Problem List   Diagnosis Date Noted  . Esophageal reflux 07/23/2015  . Nicotine dependence, cigarettes, with other nicotine-induced disorders 07/23/2015  . Cigarette nicotine dependence, uncomplicated 97/08/6376  . Hyperlipidemia 06/13/2015  . Colon polyps 01/14/2015  . Helicobacter pylori gastritis 01/14/2015  . Chronic hepatitis C ([place]) 11/30/2014  . Encounter for screening colonoscopy 11/30/2014  . Unstable angina  ([place]) 04/12/2013  . Essential hypertension 04/12/2013  . Coronary artery disease   . Mixed hyperlipidemia   . Tobacco abuse   . Chest pain 03/16/2013  . Unspecified malignant neoplasm of skin of lower limb, including hip 03/16/2013  . Tobacco use disorder 03/16/2013  . Other and unspecified hyperlipidemia 03/16/2013  . Essential hypertension, benign 03/16/2013    Past Surgical History:  Procedure Laterality Date  . BIOPSY N/A 12/19/2014   Procedure: BIOPSY;  Surgeon: Danie Binder, MD;  Location: [facility];  Service: Endoscopy;  Laterality: N/A;  . COLONOSCOPY  June 2016   [facility]: with endoscopic mucosal resection. 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 Needs colonoscopy in 1 year.   . COLONOSCOPY WITH PROPOFOL N/A 12/19/2014   Dr. Oneida Alar: six simple adenomas and 3 hyperplastic polyps. Had flat mid-transverse colon polyp and referred to [facility] for endoscopic mucosal resection, which is scheduled for July 22  . CORONARY ANGIOPLASTY WITH STENT PLACEMENT  04/11/2013   LAD &  RCA     DR COOPER  . ESOPHAGOGASTRODUODENOSCOPY (EGD) WITH PROPOFOL N/A 12/19/2014   Dr. Oneida Alar:  without varices, small hiatal hernia noted, moderate non-erosive gastritis and mild duodenitis.  POSITIVE H.PYLORI. Prescribed Prevpac.  Marland Kitchen HAND RECONSTRUCTION Left   . LEFT HEART CATHETERIZATION WITH CORONARY ANGIOGRAM N/A 04/11/2013   Procedure: LEFT HEART CATHETERIZATION WITH CORONARY ANGIOGRAM;  Surgeon: Blane Ohara, MD;  Location: [facility] CATH LAB;  Service: Cardiovascular;  Laterality: N/A;  .  LEG TENDON SURGERY Right   . POLYPECTOMY N/A 12/19/2014   Procedure: POLYPECTOMY;  Surgeon: Danie Binder, MD;  Location: [facility];  Service: Endoscopy;  Laterality: N/A;       Home Medications    Prior to Admission medications   Medication Sig Start Date End Date Taking? Authorizing Provider  aspirin EC 81 MG tablet Take 81 mg by mouth daily.   Yes Historical Provider, MD  atorvastatin (LIPITOR) 10 MG tablet  Take 10 mg by mouth daily.   Yes Historical Provider, MD  clopidogrel (PLAVIX) 75 MG tablet Take 1 tablet (75 mg total) by mouth daily with breakfast. 08/05/13  Yes Arnoldo Lenis, MD  Multiple Vitamin (MULTIVITAMIN) tablet Take 1 tablet by mouth daily.   Yes Historical Provider, MD  nitroGLYCERIN (NITROSTAT) 0.4 MG SL tablet Place 1 tablet (0.4 mg total) under the tongue every 5 (five) minutes as needed for chest pain. 04/12/13  Yes Rogelia Mire, NP  cloNIDine (CATAPRES) 0.1 MG tablet Take 1 tablet (0.1 mg total) by mouth 2 (two) times daily. 10/21/16   Soyla Dryer, PA-C  hydrochlorothiazide (HYDRODIURIL) 25 MG tablet Take 1 tablet (25 mg total) by mouth daily. 10/21/16   Soyla Dryer, PA-C  isosorbide mononitrate (IMDUR) 30 MG 24 hr tablet Take 1 tablet (30 mg total) by mouth daily. 10/21/16   Arnoldo Lenis, MD  metoprolol succinate (TOPROL-XL) 25 MG 24 hr tablet Take 1 tablet (25 mg total) by mouth daily. 10/21/16   Soyla Dryer, PA-C  pantoprazole (PROTONIX) 40 MG tablet Take 1 tablet (40 mg total) by mouth daily. 10/20/16   Annitta Needs, NP    Family History Family History  Problem Relation Age of Onset  . Stroke Mother   . Aneurysm Mother   . Hypertension Mother   . Heart disease Father   . Hypertension Father   . Heart disease Brother   . Hypertension Brother   . Diabetes Paternal Aunt   . Colon cancer Neg Hx     Social History Social History  Substance Use Topics  . Smoking status: Current Every Day Smoker    Packs/day: 0.25    Years: 40.00    Types: Cigarettes    Start date: 10/10/1971  . Smokeless tobacco: Never Used  . Alcohol use No     Allergies   Lisinopril and Neosporin [neomycin-bacitracin zn-polymyx]   Review of Systems Review of Systems  All other systems reviewed and are negative.    Physical Exam Updated Vital Signs BP (!) 163/85   Pulse 73   Temp 97.8 F (36.6 C) (Oral)   Resp 18   Ht 5\' 8"  (1.727 m)   Wt 81.2 kg   SpO2 97%    BMI 27.22 kg/m   Physical Exam  Constitutional: He appears well-developed and well-nourished. No distress.  HENT:  Head: Normocephalic and atraumatic.  Right Ear: External ear normal.  Left Ear: External ear normal.  Eyes: Conjunctivae are normal. Right eye exhibits no discharge. Left eye exhibits no discharge. No scleral icterus.  Neck: Neck supple. No tracheal deviation present.  Cardiovascular: Normal rate, regular rhythm and intact distal pulses.   Pulmonary/Chest: Effort normal and breath sounds normal. No stridor. No respiratory distress. He has no wheezes. He has no rales.  Abdominal: Soft. Bowel sounds are normal. He exhibits no distension. There is no tenderness. There is no rebound and no guarding.  Musculoskeletal: He exhibits no edema or tenderness.  Neurological: He is alert. He has  normal strength. No cranial nerve deficit (no facial droop, extraocular movements intact, no slurred speech) or sensory deficit. He exhibits normal muscle tone. He displays no seizure activity. Coordination normal.  Skin: Skin is warm and dry. No rash noted.  Psychiatric: He has a normal mood and affect.  Nursing note and vitals reviewed.    ED Treatments / Results  Labs (all labs ordered are listed, but only abnormal results are displayed) Labs Reviewed  CBC  BASIC METABOLIC PANEL  TROPONIN I    EKG Interpretation  Date/Time:  Monday October 20 2016 17:25:39 EDT Ventricular Rate:  84 PR Interval:    QRS Duration: 85 QT Interval:  367 QTC Calculation: 434 R Axis:   78 Text Interpretation:  Sinus rhythm Probable anteroseptal infarct, old No significant change since last tracing Confirmed by Aundraya Dripps  MD-J, Wess Baney ([id number]) on 10/20/2016 5:33:18 PM        Procedures Procedures (including critical care time)  Medications Ordered in ED Medications - No data to display   Initial Impression / Assessment and Plan / ED Course  I have reviewed the triage vital signs and the nursing  notes.  Pertinent labs & imaging results that were available during my care of the patient were reviewed by me and considered in my medical decision making (see chart for details).  Clinical Course as of Oct 22 105  Mon Oct 20, 2016  1724 Review medications. Patient will be given a dose of his metoprolol, clonidine and hydrochlorothiazide.  With his history of heart disease will check EKG , troponin.  [JK]    Clinical Course User Index [JK] Dorie Rank, MD    BP improved in the ED.  No signs of acute complications associated with his HTN.  Dc home in stable condition.  Final Clinical Impressions(s) / ED Diagnoses   Final diagnoses:  Essential hypertension    New Prescriptions Discharge Medication List as of 10/20/2016  7:22 PM       Dorie Rank, MD 10/22/16 [phone]

## 2016-10-20 NOTE — Patient Instructions (Signed)
Good to see you again!  Please start taking Protonix once each morning, 30 minutes before breakfast. Let me know what cardiology says tomorrow. We may need to do further testing.  I have referred you back to [facility] for the colonoscopy; if this one is good, we can do further follow-ups here.   I have ordered an ultrasound and blood work.  We will see you in 6 months!

## 2016-10-20 NOTE — ED Notes (Signed)
Pt took self off cardiac monitor, refusing to be placed back on at this time.

## 2016-10-20 NOTE — Progress Notes (Signed)
Referring Provider: Soyla Dryer, PA-C Primary Care Physician:  Soyla Dryer, PA-C Primary GI: Dr. Oneida Alar   Chief Complaint  Patient presents with  . Hepatitis C    f/u  . Hernia  . Chest Pain    HPI:   Duane Adams is a 61 y.o. male presenting today with a history of Hep C, fibrosis score F2/F3, s/p Harvoni treatment in 2016. Lost to follow-up. Had endoscopic mucosal resection at [facility] for tubular adenoma and focal high grade dysplasia in June 2016 and needed surveillance June 2017. Needs updated [place] with elastography. Needs final HCV RNA.   Notes chest discomfort chronically for years. Followed by cardiology. Will be seeing cardiology tomorrow. Sometimes feels like his heart is flipping over. Had been on a strict diet while incarcerated and this did not help his symptoms. Constant but waxes and wanes in intensity. Food does not worsen. No dysphagia. No PPI. States he stopped at some point and not sure why. States he has a hernia that goes all the way down his midline and wraps around his belly button.   Past Medical History:  Diagnosis Date  . Anxiety   . Cirrhosis ([place])   . Coronary artery disease    a. 03/2013 Cath/PCI: LM nl, LAD 85m (2.5x20 Promus Premier DES), LCX min irregs, RCA dom, 90p(3.0x20 Promus Premier DES), EF 55-65%.  . Essential hypertension, benign   . Helicobacter pylori gastritis JUN 2016 EGD/Bx   PREVPAK BID FOR 14 DAYS  . Hepatitis    Hepatitis C  . Hiatal hernia   . Mixed hyperlipidemia   . Skin cancer   . Tobacco abuse     Past Surgical History:  Procedure Laterality Date  . BIOPSY N/A 12/19/2014   Procedure: BIOPSY;  Surgeon: Danie Binder, MD;  Location: [facility];  Service: Endoscopy;  Laterality: N/A;  . COLONOSCOPY  June 2016   [facility]: with endoscopic mucosal resection. 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 Needs colonoscopy in 1 year.   . COLONOSCOPY WITH PROPOFOL N/A 12/19/2014   Dr. Oneida Alar: six simple adenomas  and 3 hyperplastic polyps. Had flat mid-transverse colon polyp and referred to [facility] for endoscopic mucosal resection, which is scheduled for July 22  . CORONARY ANGIOPLASTY WITH STENT PLACEMENT  04/11/2013   LAD &  RCA     DR COOPER  . ESOPHAGOGASTRODUODENOSCOPY (EGD) WITH PROPOFOL N/A 12/19/2014   Dr. Oneida Alar:  without varices, small hiatal hernia noted, moderate non-erosive gastritis and mild duodenitis.  POSITIVE H.PYLORI. Prescribed Prevpac.  Marland Kitchen HAND RECONSTRUCTION Left   . LEFT HEART CATHETERIZATION WITH CORONARY ANGIOGRAM N/A 04/11/2013   Procedure: LEFT HEART CATHETERIZATION WITH CORONARY ANGIOGRAM;  Surgeon: Blane Ohara, MD;  Location: [facility] CATH LAB;  Service: Cardiovascular;  Laterality: N/A;  . LEG TENDON SURGERY Right   . POLYPECTOMY N/A 12/19/2014   Procedure: POLYPECTOMY;  Surgeon: Danie Binder, MD;  Location: [facility];  Service: Endoscopy;  Laterality: N/A;    Current Outpatient Prescriptions  Medication Sig Dispense Refill  . aspirin EC 81 MG tablet Take 81 mg by mouth daily.    Marland Kitchen atorvastatin (LIPITOR) 10 MG tablet Take 10 mg by mouth daily.    . cloNIDine (CATAPRES) 0.1 MG tablet Take 0.1 mg by mouth 2 (two) times daily.    . clopidogrel (PLAVIX) 75 MG tablet Take 1 tablet (75 mg total) by mouth daily with breakfast. 30 tablet 6  . hydrochlorothiazide (HYDRODIURIL) 25 MG tablet Take 25 mg  by mouth daily.    . metoprolol succinate (TOPROL-XL) 25 MG 24 hr tablet Take 50 mg by mouth daily.     . Multiple Vitamin (MULTIVITAMIN) tablet Take 1 tablet by mouth daily.    . nitroGLYCERIN (NITROSTAT) 0.4 MG SL tablet Place 1 tablet (0.4 mg total) under the tongue every 5 (five) minutes as needed for chest pain. 25 tablet 3  . Cholestyramine POWD by Does not apply route daily.     No current facility-administered medications for this visit.     Allergies as of 10/20/2016 - Review Complete 10/20/2016  Allergen Reaction Noted  . Lisinopril Swelling 08/18/2012  . Neosporin  [neomycin-bacitracin zn-polymyx]  10/28/2013    Family History  Problem Relation Age of Onset  . Stroke Mother   . Aneurysm Mother   . Hypertension Mother   . Heart disease Father   . Hypertension Father   . Heart disease Brother   . Hypertension Brother   . Diabetes Paternal Aunt   . Colon cancer Neg Hx     Social History   Social History  . Marital status: Single    Spouse name: N/A  . Number of children: N/A  . Years of education: N/A   Social History Main Topics  . Smoking status: Current Every Day Smoker    Packs/day: 0.25    Years: 40.00    Types: Cigarettes    Start date: 10/10/1971  . Smokeless tobacco: Never Used  . Alcohol use No  . Drug use: No  . Sexual activity: Not Asked   Other Topics Concern  . None   Social History Narrative  . None    Review of Systems: As mentioned in HPI   Physical Exam: BP (!) 197/101   Pulse (!) 103   Temp 97.9 F (36.6 C) (Oral)   Ht 5\' 8"  (1.727 m)   Wt 179 lb 6.4 oz (81.4 kg)   BMI 27.28 kg/m  General:   Alert and oriented. No distress noted. Pleasant and cooperative.  Head:  Normocephalic and atraumatic. Eyes:  Conjuctiva clear without scleral icterus. Mouth:  Oral mucosa pink and moist. Good dentition. No lesions. Heart:  S1, S2 present without murmurs, rubs, or gallops. Regular rate and rhythm. Abdomen:  +BS, soft, non-tender and non-distended. No rebound or guarding. Rectus diastasis noted, possible umbilical hernia.  Msk:  Symmetrical without gross deformities. Normal posture. Extremities:  Without edema. Neurologic:  Alert and  oriented x4 Psych:  Alert and cooperative. Normal mood and affect.

## 2016-10-20 NOTE — Progress Notes (Signed)
Pt came by the office requesting new rx for htn medications. Pt states out of clonidine, HCTZ, metoprolol, and plavix for about 4-5 days. Pt states going to GI today and BP was 197/101. Pt states "not feeling too good" and seeing "blotchy spots". Pt was urged to go to the ER due to high blood pressure and was explained about the increase risk for stroke. Pt verbalized understanding and stated he would go to the ER.

## 2016-10-21 ENCOUNTER — Other Ambulatory Visit: Payer: Self-pay | Admitting: Physician Assistant

## 2016-10-21 ENCOUNTER — Ambulatory Visit (INDEPENDENT_AMBULATORY_CARE_PROVIDER_SITE_OTHER): Payer: Medicaid Other | Admitting: Cardiology

## 2016-10-21 ENCOUNTER — Encounter: Payer: Self-pay | Admitting: Cardiology

## 2016-10-21 VITALS — BP 170/100 | HR 85 | Ht 68.0 in | Wt 176.0 lb

## 2016-10-21 DIAGNOSIS — I10 Essential (primary) hypertension: Secondary | ICD-10-CM | POA: Diagnosis not present

## 2016-10-21 DIAGNOSIS — I2510 Atherosclerotic heart disease of native coronary artery without angina pectoris: Secondary | ICD-10-CM | POA: Diagnosis not present

## 2016-10-21 DIAGNOSIS — E782 Mixed hyperlipidemia: Secondary | ICD-10-CM

## 2016-10-21 DIAGNOSIS — R0789 Other chest pain: Secondary | ICD-10-CM | POA: Diagnosis not present

## 2016-10-21 MED ORDER — HYDROCHLOROTHIAZIDE 25 MG PO TABS: 25.0000 mg | ORAL_TABLET | Freq: Every day | ORAL | 0 refills | Status: DC

## 2016-10-21 MED ORDER — CLONIDINE HCL 0.1 MG PO TABS: 0.1000 mg | ORAL_TABLET | Freq: Two times a day (BID) | ORAL | 0 refills | Status: DC

## 2016-10-21 MED ORDER — ISOSORBIDE MONONITRATE ER 30 MG PO TB24: 30.0000 mg | ORAL_TABLET | Freq: Every day | ORAL | 3 refills | Status: DC

## 2016-10-21 MED ORDER — METOPROLOL SUCCINATE ER 25 MG PO TB24: 25.0000 mg | ORAL_TABLET | Freq: Every day | ORAL | 0 refills | Status: DC

## 2016-10-21 NOTE — Patient Instructions (Signed)
Medication Instructions:  START IMDUR 30 MG DAILY   Labwork: NONE  Testing/Procedures: NONE  Follow-Up: Your physician recommends that you schedule a follow-up appointment in: 1 MONTH   Any Other Special Instructions Will Be Listed Below (If Applicable).     If you need a refill on your cardiac medications before your next appointment, please call your pharmacy.

## 2016-10-21 NOTE — Assessment & Plan Note (Signed)
Completed Harvoni in 2016. Needs final Hep C RNA now to document SVR, as he was lost to follow-up. Also needs repeat US with elastography. Return in 6 months.  As of note, he reports chronic chest pain. Not on a PPI currently. Sees cardiology tomorrow. BP markedly elevated today but asymptomatic, stating he is picking up his pills when he leaves. Start PPI and contact us with persistent symptoms. May need pH/manometry.

## 2016-10-21 NOTE — Assessment & Plan Note (Signed)
Surveillance overdue now with history of multiple adenomas and endoscopic mucosal resection of adenoma with focal high grade dysplasia in 2016. Return to [facility] to arrange this. If unrevealing, future surveillance can be performed here.

## 2016-10-21 NOTE — Progress Notes (Signed)
Clinical Summary Duane Adams is a 61 y.o.male seen today for follow up of the following medical problems.    1. CAD  -  cath 04/11/13 showed significant LAD and RCA disease, s/p DES to both - echo 03/2013 with normal LVEF  - compliant w/ meds including ASA and plavix  - describes some feelings of generalized fatigue that has gotten better with decreasing his metoprolol dose. Hx of angioedema on lisinopril   - chronic aching pain left chest 3/10 in severity. Can be sharp pain at times. Typically occurs at rest. No other associated symptom. Better with deep breaths. Can last up to 24 hours without relief. No relation to food. - pain ongoing for 10 years, slow steady progression. Never relieved with stents.  - DOE at 1 block, though mainly limited by chronic hip pain.   - seen at [facility] for chets pain 08/2016. LHC at that time showed only mild CAD along with patent stents.  - still with chest pain at times. Mild discomfort.  - had been on imdur with some improvement of pain. He is off at this time.   2. HL  - bad reaction to crestor, on only low dose atorvastatin 10 mg.  09/2016 TC 176 TG 204 HDL 46 LDL 89   3. HTN  - compliant with medications, though ran out a few days ago - 130s/80s at home when on pills. Had been out for a few days.   Past Medical History:  Diagnosis Date  . Anxiety   . Cirrhosis ([place])   . Coronary artery disease    a. 03/2013 Cath/PCI: LM nl, LAD 79m(2.5x20 Promus Premier DES), LCX min irregs, RCA dom, 90p(3.0x20 Promus Premier DES), EF 55-65%.  . Essential hypertension, benign   . Helicobacter pylori gastritis JUN 2016 EGD/Bx   PREVPAK BID FOR 14 DAYS  . Hepatitis    Hepatitis C treated   . Hiatal hernia   . Mixed hyperlipidemia   . Skin cancer   . Tobacco abuse      Allergies  Allergen Reactions  . Lisinopril Swelling    Dizziness, chest pain   . Neosporin [Neomycin-Bacitracin Zn-Polymyx]      Current Outpatient Prescriptions    Medication Sig Dispense Refill  . aspirin EC 81 MG tablet Take 81 mg by mouth daily.    .Marland Kitchenatorvastatin (LIPITOR) 10 MG tablet Take 10 mg by mouth daily.    . cloNIDine (CATAPRES) 0.1 MG tablet Take 0.1 mg by mouth 2 (two) times daily.    . clopidogrel (PLAVIX) 75 MG tablet Take 1 tablet (75 mg total) by mouth daily with breakfast. 30 tablet 6  . hydrochlorothiazide (HYDRODIURIL) 25 MG tablet Take 25 mg by mouth daily.    . metoprolol succinate (TOPROL-XL) 25 MG 24 hr tablet Take 50 mg by mouth daily.     . Multiple Vitamin (MULTIVITAMIN) tablet Take 1 tablet by mouth daily.    . nitroGLYCERIN (NITROSTAT) 0.4 MG SL tablet Place 1 tablet (0.4 mg total) under the tongue every 5 (five) minutes as needed for chest pain. 25 tablet 3  . pantoprazole (PROTONIX) 40 MG tablet Take 1 tablet (40 mg total) by mouth daily. 90 tablet 3   No current facility-administered medications for this visit.      Past Surgical History:  Procedure Laterality Date  . BIOPSY N/A 12/19/2014   Procedure: BIOPSY;  Surgeon: Duane Binder MD;  Location: [facility];  Service: Endoscopy;  Laterality: N/A;  .  COLONOSCOPY  June 2016   [facility]: with endoscopic mucosal resection. 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 Needs colonoscopy in 1 year.   . COLONOSCOPY WITH PROPOFOL N/A 12/19/2014   Duane Adams: six simple adenomas and 3 hyperplastic polyps. Had flat mid-transverse colon polyp and referred to Duane Adams for endoscopic mucosal resection, which is scheduled for July 22  . CORONARY ANGIOPLASTY WITH STENT PLACEMENT  04/11/2013   LAD &  RCA     DR COOPER  . ESOPHAGOGASTRODUODENOSCOPY (EGD) WITH PROPOFOL N/A 12/19/2014   Duane Adams:  without varices, small hiatal hernia noted, moderate non-erosive gastritis and mild duodenitis.  POSITIVE H.PYLORI. Prescribed Prevpac.  Marland Kitchen HAND RECONSTRUCTION Left   . LEFT HEART CATHETERIZATION WITH CORONARY ANGIOGRAM N/A 04/11/2013   Procedure: LEFT HEART CATHETERIZATION WITH  CORONARY ANGIOGRAM;  Surgeon: Duane Ohara, MD;  Location: [facility] C[facility];  Service: Cardiovascular;  Laterality: N/A;  . LEG TENDON SURGERY Right   . POLYPECTOMY N/A 12/19/2014   Procedure: POLYPECTOMY;  Surgeon: Duane Binder, MD;  Location: [facility];  Service: Endoscopy;  Laterality: N/A;     Allergies  Allergen Reactions  . Lisinopril Swelling    Dizziness, chest pain   . Neosporin [Neomycin-Bacitracin Zn-Polymyx]       Family History  Problem Relation Age of Onset  . Stroke Mother   . Aneurysm Mother   . Hypertension Mother   . Heart disease Father   . Hypertension Father   . Heart disease Brother   . Hypertension Brother   . Diabetes Paternal Aunt   . Colon cancer Neg Hx      Social History [name] reports that he has been smoking Cigarettes.  He started smoking about 45 years ago. He has a 10.00 pack-year smoking history. He has never used smokeless tobacco. [name] reports that he does not drink alcohol.   Review of Systems CONSTITUTIONAL: No weight loss, fever, chills, weakness or fatigue.  HEENT: Eyes: No visual loss, blurred vision, double vision or yellow sclerae.No hearing loss, sneezing, congestion, runny nose or sore throat.  SKIN: No rash or itching.  CARDIOVASCULAR: per HPI RESPIRATORY: No shortness of breath, cough or sputum.  GASTROINTESTINAL: No anorexia, nausea, vomiting or diarrhea. No abdominal pain or blood.  GENITOURINARY: No burning on urination, no polyuria NEUROLOGICAL: No headache, dizziness, syncope, paralysis, ataxia, numbness or tingling in the extremities. No change in bowel or bladder control.  MUSCULOSKELETAL: No muscle, back pain, joint pain or stiffness.  LYMPHATICS: No enlarged nodes. No history of splenectomy.  PSYCHIATRIC: No history of depression or anxiety.  ENDOCRINOLOGIC: No reports of sweating, cold or heat intolerance. No polyuria or polydipsia.  Marland Kitchen   Physical Examination Vitals:   10/21/16 0921  BP: (!) 170/100   Pulse: 85   Vitals:   10/21/16 0921  Weight: 176 lb (79.8 kg)  Height: _0  (1.727 m)    Gen: resting comfortably, no acute distress HEENT: no scleral icterus, pupils equal round and reactive, no palptable cervical adenopathy,  CV: RRR, no m/r/g, no jvd Resp: Clear to auscultation bilaterally GI: abdomen is soft, non-tender, non-distended, normal bowel sounds, no hepatosplenomegaly MSK: extremities are warm, no edema.  Skin: warm, no rash Neuro:  no focal deficits Psych: appropriate affect   Diagnostic Studies 04/11/13 Cath  PROCEDURAL FINDINGS  Hemodynamics:  AO 178/93  LV 177/18  Coronary angiography:  Coronary dominance: right  Left mainstem: Widely patent without obstructive disease  Left anterior descending (LAD): severe mid-vessel stenosis (  80%) between the first and second septal perforators. The vessel reaches the LV apex and there is no other significant disease noted. Large D1 is patent.  Left circumflex (LCx): small vessel, supplies 2 OM branches. Mild irregularity noted.  Right coronary artery (RCA): large, dominant vessel. There is severe 90% proximal stenosis. Irregularity in the mid-vessel without significant stenosis. The PDA and PLA branches are both large without significant disease.  Left ventriculography: Left ventricular systolic function is normal, LVEF is estimated at 55-65%, there is no significant mitral regurgitation  PCI Note: Following the diagnostic procedure, the decision was made to proceed with PCI. The patient was loaded with plavix 600 mg. Weight-based bivalirudin was given for anticoagulation. I planned on treating the LAD and RCA, both of which have high-grade disease. Once a therapeutic ACT was achieved, a 6 [place] XB-LAD guide catheter was inserted. A cougar coronary guidewire was used to cross the lesion. The lesion was predilated with a 2.0 mm balloon. The lesion was then stented with a 2.5x20 mm Promus Premier drug-eluting stent.  The stent was postdilated with a 2.75 mm noncompliant balloon. Following PCI, there was 0% residual stenosis and TIMI-3 flow. Attention was then turned to the RCA. A JR-4 guide was used. The same cougar guidewire was used to cross the lesion. The lesion was dilated with a 2.0 mm balloon and stented with a 3.0x20 mm Promus Premier DES. The stent was post-dilated to 18 atm with a 3.25 mm  balloon. Final angiography confirmed an excellent result. The patient tolerated the procedure well. There were no immediate procedural complications. A TR band was used for radial hemostasis. The patient was transferred to the post catheterization recovery area for further monitoring.  PCI Data:  Lesion 1  Vessel - LAD/Segment - mid  Percent Stenosis (pre) 80  TIMI-flow 3  Stent 2.5x20 mm Promus Premier DES  Percent Stenosis (post) 0  TIMI-flow (post) 3  Lesion 2  Vessel - RCA/Segment - prox  Percent Stenosis (pre) 90  TIMI-flow 3  Stent 3.0x20 mm Promus Premier DES  Percent Stenosis (post) 0  TIMI-flow (post) 3  Final Conclusions:  Severe 2 vessel CAD with successful PCI of the LAD and RCA  Normal LV function  Recommendations:  ASA and plavix for at least 12 months.   04/04/13 Echo  LVEF 60-65%, no WMA, grade I diastolic dysfunction    12/5033 Exercise Nuclear Stress IMPRESSION: 1. No reversible ischemia or infarction. Submaximal stress test, thus diminishing the ability of this test to detect ischemia.  2. Normal left ventricular wall motion.  3. Left ventricular ejection fraction 78%  4. Low-risk stress test findings*. Consider Lexiscan in the future.   08/2016 [facility] cath Angiography: LVgram: not performed LCA: LAD stent patent, mild luminal irregularites, no significant obstructive disease RCA: proximal stent patent, large R dominant RCA,mild luminal irregularities in distal [place]  Impression:   Mild CAD with Patent Stents RCA and LAD Normal resting LVedp  (14 mm)  Plan:  Follow-up with referring physician. Echocardiogram suggested to evaluate possible HFrEF as cause for symptoms      Assessment and Plan  1. CAD  - s/p DES to LAD and DES to RCA in setting of angina  - he has favored continuing plavix, which given his stent burden is reasonable - long history of atypical chest pain. Recent cath 08/2016 at [facility] without significant CAD - recent mild atypical symptoms, he has been off his meds. Restart home meds including imdur 1m daily and follow  symptoms.  - EKG in clinic SR, no acute ischemic changes.   2. HTN  - elevated in clinic but has been off meds - he is to resume meds, we will continue to montior.  3. HL  - low dose statin, did not tolerate higher doses     F/u 1 month. Reevaluate symptoms and bp's once steady back on meds.       Arnoldo Lenis, M.D., F.A.C.C.

## 2016-10-22 NOTE — Telephone Encounter (Signed)
Called [employer] to f/u on PA for [place] abd, pt is scheduled tomorrow. Case still pending. Receptionist entered high importance email for case to be finished. She stated it would be completed by close of business tonight which is 9:00pm.

## 2016-10-22 NOTE — Progress Notes (Signed)
cc'ed to pcp °

## 2016-10-23 ENCOUNTER — Telehealth: Payer: Self-pay

## 2016-10-23 ENCOUNTER — Other Ambulatory Visit: Payer: Self-pay | Admitting: Cardiology

## 2016-10-23 ENCOUNTER — Ambulatory Visit (HOSPITAL_COMMUNITY): Admission: RE | Admit: 2016-10-23 | Payer: Medicaid Other | Source: Ambulatory Visit

## 2016-10-23 DIAGNOSIS — E782 Mixed hyperlipidemia: Secondary | ICD-10-CM

## 2016-10-23 LAB — HEPATITIS C RNA QUANTITATIVE
HCV QUANT LOG: NOT DETECTED {Log_IU}/mL
HCV QUANT: NOT DETECTED [IU]/mL

## 2016-10-23 MED ORDER — CLOPIDOGREL BISULFATE 75 MG PO TABS: 75.0000 mg | ORAL_TABLET | Freq: Every day | ORAL | 3 refills | Status: DC

## 2016-10-23 NOTE — Telephone Encounter (Signed)
Patient requested 90 day supply to [place], done

## 2016-10-23 NOTE — Telephone Encounter (Signed)
Called and informed pt that his ultrasound was approved (see separate phone note).   He said that he seen Dr. Harl Bowie (cardiology) and he is ok with whatever pt needs. Forwarding to AB as FYI.

## 2016-10-23 NOTE — Telephone Encounter (Signed)
Pt is needing Dr. Harl Bowie to write Rx for his clopidogrel (PLAVIX) 75 MG tablet [[id number]], he's been out of it for a while and the [facility] can't write this Rx

## 2016-10-23 NOTE — Telephone Encounter (Signed)
[place] abd complete approved. PA# [id number]. 10/20/16-11/19/16. Called and informed pt. His [place] was rescheduled from today to 10/30/16 d/t was waiting for PA.

## 2016-10-27 ENCOUNTER — Ambulatory Visit: Payer: Self-pay | Admitting: Physician Assistant

## 2016-10-27 ENCOUNTER — Encounter: Payer: Self-pay | Admitting: Physician Assistant

## 2016-10-27 VITALS — BP 126/82 | HR 84 | Temp 97.9°F | Ht 68.0 in | Wt 182.5 lb

## 2016-10-27 DIAGNOSIS — E785 Hyperlipidemia, unspecified: Secondary | ICD-10-CM

## 2016-10-27 DIAGNOSIS — I25119 Atherosclerotic heart disease of native coronary artery with unspecified angina pectoris: Secondary | ICD-10-CM

## 2016-10-27 DIAGNOSIS — F17218 Nicotine dependence, cigarettes, with other nicotine-induced disorders: Secondary | ICD-10-CM

## 2016-10-27 DIAGNOSIS — K219 Gastro-esophageal reflux disease without esophagitis: Secondary | ICD-10-CM

## 2016-10-27 DIAGNOSIS — I10 Essential (primary) hypertension: Secondary | ICD-10-CM

## 2016-10-27 MED ORDER — METOPROLOL SUCCINATE ER 50 MG PO TB24: 50.0000 mg | ORAL_TABLET | Freq: Every day | ORAL | 3 refills | Status: DC

## 2016-10-27 MED ORDER — ATORVASTATIN CALCIUM 10 MG PO TABS: 10.0000 mg | ORAL_TABLET | Freq: Every day | ORAL | 4 refills | Status: DC

## 2016-10-27 NOTE — Progress Notes (Signed)
BP 126/82 (BP Location: Left Arm, Patient Position: Sitting, Cuff Size: Normal)   Pulse 84   Temp 97.9 F (36.6 C)   Ht 5\' 8"  (1.727 m)   Wt 182 lb 8 oz (82.8 kg)   SpO2 98%   BMI 27.75 kg/m    Subjective:    Patient ID: Duane [facility], male    DOB: [birth year], 61 y.o.   MRN: [id number]  HPI: Duane Adams is a 61 y.o. male presenting on 10/27/2016 for Follow-up   HPI   Pt says he has medicaid  Pt has been to GI and Cardiology since his last OV here on 09/24/16.    He is scheduled for repeat EGD and colonoscopy  Pt says he feels more tired and sob than he did when he was in jail and was exercising and not smoking.  He currently is smoking and no exercising.   Relevant past medical, surgical, family and social history reviewed and updated as indicated. Interim medical history since our last visit reviewed. Allergies and medications reviewed and updated.   Current Outpatient Prescriptions:  .  aspirin EC 81 MG tablet, Take 81 mg by mouth daily., Disp: , Rfl:  .  atorvastatin (LIPITOR) 10 MG tablet, Take 10 mg by mouth daily., Disp: , Rfl:  .  cloNIDine (CATAPRES) 0.1 MG tablet, Take 1 tablet (0.1 mg total) by mouth 2 (two) times daily., Disp: 60 tablet, Rfl: 0 .  clopidogrel (PLAVIX) 75 MG tablet, Take 1 tablet (75 mg total) by mouth daily with breakfast., Disp: 90 tablet, Rfl: 3 .  hydrochlorothiazide (HYDRODIURIL) 25 MG tablet, Take 1 tablet (25 mg total) by mouth daily., Disp: 30 tablet, Rfl: 0 .  isosorbide mononitrate (IMDUR) 30 MG 24 hr tablet, Take 1 tablet (30 mg total) by mouth daily., Disp: 30 tablet, Rfl: 3 .  metoprolol succinate (TOPROL-XL) 25 MG 24 hr tablet, Take 1 tablet (25 mg total) by mouth daily. (Patient taking differently: Take 50 mg by mouth daily. ), Disp: 30 tablet, Rfl: 0 .  Multiple Vitamin (MULTIVITAMIN) tablet, Take 1 tablet by mouth daily., Disp: , Rfl:  .  nitroGLYCERIN (NITROSTAT) 0.4 MG SL tablet, Place 1 tablet (0.4 mg total) under the  tongue every 5 (five) minutes as needed for chest pain., Disp: 25 tablet, Rfl: 3 .  pantoprazole (PROTONIX) 40 MG tablet, Take 1 tablet (40 mg total) by mouth daily., Disp: 90 tablet, Rfl: 3   Review of Systems  Constitutional: Positive for fatigue. Negative for appetite change, chills, diaphoresis, fever and unexpected weight change.  HENT: Positive for dental problem and hearing loss. Negative for congestion, drooling, ear pain, facial swelling, mouth sores, sneezing, sore throat, trouble swallowing and voice change.   Eyes: Negative for pain, discharge, redness, itching and visual disturbance.  Respiratory: Positive for shortness of breath. Negative for cough, choking and wheezing.   Cardiovascular: Negative for chest pain, palpitations and leg swelling.  Gastrointestinal: Negative for abdominal pain, blood in stool, constipation, diarrhea and vomiting.  Endocrine: Negative for cold intolerance, heat intolerance and polydipsia.  Genitourinary: Negative for decreased urine volume, dysuria and hematuria.  Musculoskeletal: Negative for arthralgias, back pain and gait problem.  Skin: Negative for rash.  Allergic/Immunologic: Negative for environmental allergies.  Neurological: Positive for headaches. Negative for seizures, syncope and light-headedness.  Hematological: Negative for adenopathy.  Psychiatric/Behavioral: Positive for agitation. Negative for dysphoric mood and suicidal ideas. The patient is not nervous/anxious.     Per HPI unless specifically indicated above  Objective:    BP 126/82 (BP Location: Left Arm, Patient Position: Sitting, Cuff Size: Normal)   Pulse 84   Temp 97.9 F (36.6 C)   Ht 5\' 8"  (1.727 m)   Wt 182 lb 8 oz (82.8 kg)   SpO2 98%   BMI 27.75 kg/m   Wt Readings from Last 3 Encounters:  10/27/16 182 lb 8 oz (82.8 kg)  10/21/16 176 lb (79.8 kg)  10/20/16 179 lb (81.2 kg)    Physical Exam  Constitutional: He is oriented to person, place, and time. He  appears well-developed and well-nourished.  HENT:  Head: Normocephalic and atraumatic.  Neck: Neck supple.  Cardiovascular: Normal rate and regular rhythm.   Pulmonary/Chest: Effort normal and breath sounds normal. He has no wheezes.  Abdominal: Soft. Bowel sounds are normal. There is no hepatosplenomegaly. There is no tenderness.  Musculoskeletal: He exhibits no edema.  Lymphadenopathy:    He has no cervical adenopathy.  Neurological: He is alert and oriented to person, place, and time.  Skin: Skin is warm and dry.  Psychiatric: He has a normal mood and affect. His behavior is normal.  Vitals reviewed.    Lipoma back and L shoulder   Results for orders placed or performed during the hospital encounter of 10/20/16  CBC  Result Value Ref Range   WBC 9.5 4.0 - 10.5 K/uL   RBC 4.68 4.22 - 5.81 MIL/uL   Hemoglobin 14.5 13.0 - 17.0 g/dL   HCT 42.7 39.0 - 52.0 %   MCV 91.2 78.0 - 100.0 fL   MCH 31.0 26.0 - 34.0 pg   MCHC 34.0 30.0 - 36.0 g/dL   RDW 14.4 11.5 - 15.5 %   Platelets 194 150 - 400 K/uL  Basic metabolic panel  Result Value Ref Range   Sodium 137 135 - 145 mmol/L   Potassium 3.9 3.5 - 5.1 mmol/L   Chloride 101 101 - 111 mmol/L   CO2 26 22 - 32 mmol/L   Glucose, Bld 99 65 - 99 mg/dL   BUN 13 6 - 20 mg/dL   Creatinine, Ser 0.81 0.61 - 1.24 mg/dL   Calcium 9.4 8.9 - 10.3 mg/dL   GFR calc non Af Amer >60 >60 mL/min   GFR calc Af Amer >60 >60 mL/min   Anion gap 10 5 - 15  Troponin I  Result Value Ref Range   Troponin I <0.03 <0.03 ng/mL      Assessment & Plan:    Encounter Diagnoses  Name Primary?  . Essential hypertension, benign Yes  . Coronary artery disease involving native coronary artery of native heart with angina pectoris ([place])   . Nicotine dependence, cigarettes, with other nicotine-induced disorders   . Hyperlipidemia, unspecified hyperlipidemia type   . Gastroesophageal reflux disease, esophagitis presence not specified     -reviewed labs with  pt -pt to follow up with GI and cardiology as scheduled -counseled pt on smoking cessation and regular exercise so that he can feel better and it will be good for his health as well -follow up 3 months. RTO sooner prn

## 2016-10-27 NOTE — Telephone Encounter (Signed)
How is he doing now that he is on a PPI?

## 2016-10-27 NOTE — Progress Notes (Signed)
REVIEWED-NO ADDITIONAL RECOMMENDATIONS. 

## 2016-10-27 NOTE — Patient Instructions (Signed)
Exercise regularly  Stop smoking again

## 2016-10-28 ENCOUNTER — Other Ambulatory Visit: Payer: Self-pay

## 2016-10-28 DIAGNOSIS — R0789 Other chest pain: Secondary | ICD-10-CM

## 2016-10-28 NOTE — Progress Notes (Signed)
No need to further test HCV RNA. He has achieved SVR.

## 2016-10-28 NOTE — Progress Notes (Signed)
Pt is aware.  

## 2016-10-28 NOTE — Telephone Encounter (Signed)
Pt is aware. Duane Adams he thought he was scheduled for that on 10/30/2016. I told him that I see he is scheduled for [place] that day. Please advise if he is already scheduled for the pH manometry.

## 2016-10-28 NOTE — Telephone Encounter (Signed)
Order for pH/manometry faxed to [facility]. Called and informed pt that someone from [facility] would contact him to schedule test. He is scheduled for [place] with elastorgraphy 10/30/16.

## 2016-10-28 NOTE — Telephone Encounter (Signed)
Pt said he has been taking the Protonix daily for a week and he does not notice any difference. He still feels the same.

## 2016-10-28 NOTE — Telephone Encounter (Signed)
Let's arrange for pH/manometry.

## 2016-10-30 ENCOUNTER — Ambulatory Visit (HOSPITAL_COMMUNITY)
Admission: RE | Admit: 2016-10-30 | Discharge: 2016-10-30 | Disposition: A | Discharge status: Home / Self Care | Payer: Medicaid Other | Source: Ambulatory Visit | Attending: Gastroenterology | Admitting: Gastroenterology

## 2016-10-30 DIAGNOSIS — B182 Chronic viral hepatitis C: Secondary | ICD-10-CM | POA: Diagnosis not present

## 2016-10-30 DIAGNOSIS — K760 Fatty (change of) liver, not elsewhere classified: Secondary | ICD-10-CM | POA: Insufficient documentation

## 2016-11-03 NOTE — Progress Notes (Signed)
ON RECALL  °

## 2016-11-03 NOTE — Progress Notes (Signed)
PT is aware. Routing to [place] to nic repeat US in 6 months.

## 2016-11-03 NOTE — Progress Notes (Signed)
Last elastography was F2/F3, no change. Repeat US abdomen in 6 months.

## 2016-11-27 ENCOUNTER — Encounter: Payer: Self-pay | Admitting: Cardiology

## 2016-11-27 ENCOUNTER — Ambulatory Visit (INDEPENDENT_AMBULATORY_CARE_PROVIDER_SITE_OTHER): Payer: Medicaid Other | Admitting: Cardiology

## 2016-11-27 VITALS — BP 162/92 | HR 95 | Ht 68.0 in | Wt 183.0 lb

## 2016-11-27 DIAGNOSIS — I25119 Atherosclerotic heart disease of native coronary artery with unspecified angina pectoris: Secondary | ICD-10-CM

## 2016-11-27 DIAGNOSIS — I10 Essential (primary) hypertension: Secondary | ICD-10-CM

## 2016-11-27 DIAGNOSIS — E782 Mixed hyperlipidemia: Secondary | ICD-10-CM | POA: Diagnosis not present

## 2016-11-27 MED ORDER — CHLORTHALIDONE 25 MG PO TABS: ORAL_TABLET | ORAL | 3 refills | Status: DC

## 2016-11-27 MED ORDER — METOPROLOL SUCCINATE ER 50 MG PO TB24: ORAL_TABLET | ORAL | 3 refills | Status: DC

## 2016-11-27 MED ORDER — ISOSORBIDE MONONITRATE ER 30 MG PO TB24: 15.0000 mg | ORAL_TABLET | Freq: Every day | ORAL | 3 refills | Status: DC

## 2016-11-27 NOTE — Patient Instructions (Signed)
Medication Instructions:  DECREASE IMDUR TO 15 MG DAILY  START CHLORTHALIDONE 12.5 MG DAILY ( 1/2 TABLET )  INCREASE TOPROL XL TO 75 MG DAILY  STOP HCTZ    Labwork: Your physician recommends that you return for lab work in: [place]    Testing/Procedures: NONE  Follow-Up: Your physician recommends that you schedule a follow-up appointment in: [place] recommends that you schedule a follow-up appointment in: [place] DR. BRANCH     Any Other Special Instructions Will Be Listed Below (If Applicable).     If you need a refill on your cardiac medications before your next appointment, please call your pharmacy.

## 2016-11-27 NOTE — Progress Notes (Signed)
Clinical Summary Duane Adams is a 61 y.o.male seen today for follow up of the following medical problems.    1. CAD  - cath 04/11/13 showed significant LAD and RCA disease, s/p DES to both - echo 03/2013 with normal LVEF  - compliant w/ meds including ASA and plavix  - describes some feelings of generalized fatigue that has gotten better with decreasing his metoprolol dose. Hx of angioedema on lisinopril   - chronic aching pain left chest 3/10 in severity. Can be sharp pain at times. Typically occurs at rest. No other associated symptom. Better with deep breaths. Can last up to 24 hours without relief. No relation to food. - pain ongoing for 10 years, slow steady progression. Never relieved with stents.  - DOE at 1 block, though mainly limited by chronic hip pain.   - seen at [facility] for chest pain 08/2016. LHC at that time showed only mild CAD along with patent stents.  - still with chest pain at times. Mild discomfort.  - had been on imdur with some improvement of pain. He is off at this time.   - restarted imdur last visit.  - occasional chest pressure, better than previous. Reports some headaches on imdur.     2. HL  - bad reaction to crestor, on only low dose atorvastatin 10 mg.  - 09/2016 TC 176 TG 204 HDL 46 LDL 89 - remains compliant with statin   3. HTN  - compliant with meds    Past Medical History:  Diagnosis Date  . Anxiety   . Cirrhosis ([place])   . Coronary artery disease    a. 03/2013 Cath/PCI: LM nl, LAD 30m(2.5x20 Promus Premier DES), LCX min irregs, RCA dom, 90p(3.0x20 Promus Premier DES), EF 55-65%.  . Essential hypertension, benign   . Helicobacter pylori gastritis JUN 2016 EGD/Bx   PREVPAK BID FOR 14 DAYS  . Hepatitis    Hepatitis C treated   . Hiatal hernia   . Mixed hyperlipidemia   . Skin cancer   . Tobacco abuse      Allergies  Allergen Reactions  . Lisinopril Swelling    Dizziness, chest pain   . Neosporin [Neomycin-Bacitracin  Zn-Polymyx]      Current Outpatient Prescriptions  Medication Sig Dispense Refill  . aspirin EC 81 MG tablet Take 81 mg by mouth daily.    .Marland Kitchenatorvastatin (LIPITOR) 10 MG tablet Take 1 tablet (10 mg total) by mouth daily. 30 tablet 4  . cloNIDine (CATAPRES) 0.1 MG tablet Take 1 tablet (0.1 mg total) by mouth 2 (two) times daily. 60 tablet 0  . clopidogrel (PLAVIX) 75 MG tablet Take 1 tablet (75 mg total) by mouth daily with breakfast. 90 tablet 3  . hydrochlorothiazide (HYDRODIURIL) 25 MG tablet Take 1 tablet (25 mg total) by mouth daily. 30 tablet 0  . isosorbide mononitrate (IMDUR) 30 MG 24 hr tablet Take 1 tablet (30 mg total) by mouth daily. 30 tablet 3  . metoprolol succinate (TOPROL-XL) 50 MG 24 hr tablet Take 1 tablet (50 mg total) by mouth daily. Take with or immediately following a meal. 30 tablet 3  . Multiple Vitamin (MULTIVITAMIN) tablet Take 1 tablet by mouth daily.    . nitroGLYCERIN (NITROSTAT) 0.4 MG SL tablet Place 1 tablet (0.4 mg total) under the tongue every 5 (five) minutes as needed for chest pain. 25 tablet 3  . pantoprazole (PROTONIX) 40 MG tablet Take 1 tablet (40 mg total) by mouth daily.  90 tablet 3   No current facility-administered medications for this visit.      Past Surgical History:  Procedure Laterality Date  . BIOPSY N/A 12/19/2014   Procedure: BIOPSY;  Surgeon: Danie Binder, MD;  Location: [facility];  Service: Endoscopy;  Laterality: N/A;  . COLONOSCOPY  June 2016   [facility]: with endoscopic mucosal resection. 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 Needs colonoscopy in 1 year.   . COLONOSCOPY WITH PROPOFOL N/A 12/19/2014   Dr. Oneida Alar: six simple adenomas and 3 hyperplastic polyps. Had flat mid-transverse colon polyp and referred to [facility] for endoscopic mucosal resection, which is scheduled for July 22  . CORONARY ANGIOPLASTY WITH STENT PLACEMENT  04/11/2013   LAD &  RCA     DR COOPER  . ESOPHAGOGASTRODUODENOSCOPY (EGD) WITH PROPOFOL N/A  12/19/2014   Dr. Oneida Alar:  without varices, small hiatal hernia noted, moderate non-erosive gastritis and mild duodenitis.  POSITIVE H.PYLORI. Prescribed Prevpac.  Marland Kitchen HAND RECONSTRUCTION Left   . LEFT HEART CATHETERIZATION WITH CORONARY ANGIOGRAM N/A 04/11/2013   Procedure: LEFT HEART CATHETERIZATION WITH CORONARY ANGIOGRAM;  Surgeon: Blane Ohara, MD;  Location[facility] CATH LAB;  Service: Cardiovascular;  Laterality: N/A;  . LEG TENDON SURGERY Right   . POLYPECTOMY N/A 12/19/2014   Procedure: POLYPECTOMY;  Surgeon: Danie Binder, MD;  Location: [facility];  Service: Endoscopy;  Laterality: N/A;     Allergies  Allergen Reactions  . Lisinopril Swelling    Dizziness, chest pain   . Neosporin [Neomycin-Bacitracin Zn-Polymyx]       Family History  Problem Relation Age of Onset  . Stroke Mother   . Aneurysm Mother   . Hypertension Mother   . Heart disease Father   . Hypertension Father   . Heart disease Brother   . Hypertension Brother   . Diabetes Paternal Aunt   . Colon cancer Neg Hx      Social History Duane Adams reports that he has been smoking Cigarettes.  He started smoking about 45 years ago. He has a 10.00 pack-year smoking history. He has never used smokeless tobacco. Duane Adams reports that he does not drink alcohol.   Review of Systems CONSTITUTIONAL: No weight loss, fever, chills, weakness or fatigue.  HEENT: Eyes: No visual loss, blurred vision, double vision or yellow sclerae.No hearing loss, sneezing, congestion, runny nose or sore throat.  SKIN: No rash or itching.  CARDIOVASCULAR: per hpi RESPIRATORY: No shortness of breath, cough or sputum.  GASTROINTESTINAL: No anorexia, nausea, vomiting or diarrhea. No abdominal pain or blood.  GENITOURINARY: No burning on urination, no polyuria NEUROLOGICAL: No headache, dizziness, syncope, paralysis, ataxia, numbness or tingling in the extremities. No change in bowel or bladder control.  MUSCULOSKELETAL: No muscle, back pain,  joint pain or stiffness.  LYMPHATICS: No enlarged nodes. No history of splenectomy.  PSYCHIATRIC: No history of depression or anxiety.  ENDOCRINOLOGIC: No reports of sweating, cold or heat intolerance. No polyuria or polydipsia.  Marland Kitchen   Physical Examination Vitals:   11/27/16 1024  BP: (!) 162/92  Pulse: 95   Vitals:   11/27/16 1024  Weight: 183 lb (83 kg)  Height: _0  (1.727 m)    Gen: resting comfortably, no acute distress HEENT: no scleral icterus, pupils equal round and reactive, no palptable cervical adenopathy,  CV: RRR, no mrg, no jvd Resp: Clear to auscultation bilaterally GI: abdomen is soft, non-tender, non-distended, normal bowel sounds, no hepatosplenomegaly MSK: extremities are warm, no edema.  Skin:  warm, no rash Neuro:  no focal deficits Psych: appropriate affect   Diagnostic Studies 04/11/13 Cath PROCEDURAL FINDINGS  Hemodynamics:  AO 178/93  LV 177/18  Coronary angiography:  Coronary dominance: right  Left mainstem: Widely patent without obstructive disease  Left anterior descending (LAD): severe mid-vessel stenosis (80%) between the first and second septal perforators. The vessel reaches the LV apex and there is no other significant disease noted. Large D1 is patent.  Left circumflex (LCx): small vessel, supplies 2 OM branches. Mild irregularity noted.  Right coronary artery (RCA): large, dominant vessel. There is severe 90% proximal stenosis. Irregularity in the mid-vessel without significant stenosis. The PDA and PLA branches are both large without significant disease.  Left ventriculography: Left ventricular systolic function is normal, LVEF is estimated at 55-65%, there is no significant mitral regurgitation  PCI Note: Following the diagnostic procedure, the decision was made to proceed with PCI. The patient was loaded with plavix 600 mg. Weight-based bivalirudin was given for anticoagulation. I planned on treating the LAD and RCA, both of  which have high-grade disease. Once a therapeutic ACT was achieved, a 6 [place] XB-LAD guide catheter was inserted. A cougar coronary guidewire was used to cross the lesion. The lesion was predilated with a 2.0 mm balloon. The lesion was then stented with a 2.5x20 mm Promus Premier drug-eluting stent. The stent was postdilated with a 2.75 mm noncompliant balloon. Following PCI, there was 0% residual stenosis and TIMI-3 flow. Attention was then turned to the RCA. A JR-4 guide was used. The same cougar guidewire was used to cross the lesion. The lesion was dilated with a 2.0 mm balloon and stented with a 3.0x20 mm Promus Premier DES. The stent was post-dilated to 18 atm with a 3.25 mm [place] balloon. Final angiography confirmed an excellent result. The patient tolerated the procedure well. There were no immediate procedural complications. A TR band was used for radial hemostasis. The patient was transferred to the post catheterization recovery area for further monitoring.  PCI Data:  Lesion 1  Vessel - LAD/Segment - mid  Percent Stenosis (pre) 80  TIMI-flow 3  Stent 2.5x20 mm Promus Premier DES  Percent Stenosis (post) 0  TIMI-flow (post) 3  Lesion 2  Vessel - RCA/Segment - prox  Percent Stenosis (pre) 90  TIMI-flow 3  Stent 3.0x20 mm Promus Premier DES  Percent Stenosis (post) 0  TIMI-flow (post) 3  Final Conclusions:  Severe 2 vessel CAD with successful PCI of the LAD and RCA  Normal LV function  Recommendations:  ASA and plavix for at least 12 months.   04/04/13 Echo LVEF 60-65%, no WMA, grade I diastolic dysfunction    11/3612 Exercise Nuclear Stress IMPRESSION: 1. No reversible ischemia or infarction. Submaximal stress test, thus diminishing the ability of this test to detect ischemia.  2. Normal left ventricular wall motion.  3. Left ventricular ejection fraction 78%  4. Low-risk stress test findings*. Consider Lexiscan in the future.   08/2016 [facility]  cath Angiography: LVgram: not performed LCA: LAD stent patent, mild luminal irregularites, no significant obstructive disease RCA: proximal stent patent, large R dominant RCA,mild luminal irregularities in distal Iam [place]  Impression:   Mild CAD with Patent Stents RCA and LAD Normal resting LVedp (14 mm)  Plan:  Follow-up with referring physician. Echocardiogram suggested to evaluate possible HFrEF as cause for symptoms    Assessment and Plan  1. CAD  - s/p DES to LAD and DES to RCA in setting of angina  -  he has favored continuing plavix, which given his stent burden is reasonable - long history of atypical chest pain. Recent cath 08/2016 at [facility] without significant CAD - occasional chest pain. Headaches on imdur. Will lower imdur to 14m daily, increase Toprol to 762mdialy  2. HTN  - elevated in clinic. Increase Toprol as descriebd above, change HCTZ to chlorthalidone 12.89m72mailly. Check BMET/Mg in 2 weeks.  - nursing visit with bp check in 2 weeks.  3. HL  - low dose statin, did not tolerate higher doses     F/u 1 month.      JonArnoldo Lenis.D.

## 2016-12-11 ENCOUNTER — Other Ambulatory Visit (HOSPITAL_COMMUNITY)
Admission: RE | Admit: 2016-12-11 | Discharge: 2016-12-11 | Disposition: A | Discharge status: Home / Self Care | Payer: Medicaid Other | Source: Ambulatory Visit | Attending: Cardiology | Admitting: Cardiology

## 2016-12-11 ENCOUNTER — Ambulatory Visit (INDEPENDENT_AMBULATORY_CARE_PROVIDER_SITE_OTHER): Payer: Medicaid Other | Admitting: *Deleted

## 2016-12-11 VITALS — BP 113/73 | HR 78 | Ht 68.0 in | Wt 180.0 lb

## 2016-12-11 DIAGNOSIS — I10 Essential (primary) hypertension: Secondary | ICD-10-CM | POA: Insufficient documentation

## 2016-12-11 LAB — MAGNESIUM: MAGNESIUM: 2 mg/dL (ref 1.7–2.4)

## 2016-12-11 LAB — BASIC METABOLIC PANEL
ANION GAP: 9 (ref 5–15)
BUN: 17 mg/dL (ref 6–20)
CALCIUM: 9.9 mg/dL (ref 8.9–10.3)
CO2: 27 mmol/L (ref 22–32)
Chloride: 102 mmol/L (ref 101–111)
Creatinine, Ser: 1.04 mg/dL (ref 0.61–1.24)
Glucose, Bld: 93 mg/dL (ref 65–99)
Potassium: 3.4 mmol/L — ABNORMAL LOW (ref 3.5–5.1)
SODIUM: 138 mmol/L (ref 135–145)

## 2016-12-11 NOTE — Progress Notes (Signed)
Patient presents for nurse BP check per last office visit and with recent medication changes. Medications reconciled during visit. Patient has taken all doses of medications as prescribed except he ran out of pravastatin 2 days ago but will be picking it up from the pharmacy on tomorrow. Denies side effects, chest pain, dizziness or sob. Patient admits that headaches and cramps have improved since imdur dose decrease. Patient alert and oriented x's 4.

## 2016-12-15 NOTE — Progress Notes (Signed)
Nursing visit vitals look good, no changes   JBranch MD

## 2016-12-16 ENCOUNTER — Telehealth: Payer: Self-pay

## 2016-12-16 MED ORDER — POTASSIUM CHLORIDE CRYS ER 20 MEQ PO TBCR: 20.0000 meq | EXTENDED_RELEASE_TABLET | Freq: Every day | ORAL | 3 refills | Status: DC

## 2016-12-16 NOTE — Telephone Encounter (Signed)
Called pt, he was unavailable to talk. Asked pt's mother to have pt return call. Sent rx for potassium 20 meq in to [facility].

## 2016-12-16 NOTE — Telephone Encounter (Signed)
-----   Message from Arnoldo Lenis, MD sent at 12/15/2016  2:11 PM EDT ----- Potassium is low, please start KCl 85meQ daily  Zandra Abts MD

## 2016-12-17 ENCOUNTER — Telehealth: Payer: Self-pay

## 2016-12-17 MED ORDER — CHLORTHALIDONE 25 MG PO TABS: ORAL_TABLET | ORAL | 3 refills | Status: DC

## 2016-12-17 NOTE — Telephone Encounter (Signed)
-----   Message from Arnoldo Lenis, MD sent at 12/15/2016  2:11 PM EDT ----- Potassium is low, please start KCl 29meQ daily  Zandra Abts MD

## 2016-12-17 NOTE — Telephone Encounter (Signed)
Pt made aware, copy to pcp. Sent in RX for potassium.

## 2016-12-19 ENCOUNTER — Other Ambulatory Visit: Payer: Self-pay | Admitting: Gastroenterology

## 2016-12-22 ENCOUNTER — Other Ambulatory Visit: Payer: Self-pay | Admitting: Physician Assistant

## 2016-12-22 MED ORDER — CLONIDINE HCL 0.1 MG PO TABS: 0.1000 mg | ORAL_TABLET | Freq: Two times a day (BID) | ORAL | 1 refills | Status: DC

## 2017-01-01 ENCOUNTER — Encounter: Payer: Self-pay | Admitting: Physician Assistant

## 2017-01-15 ENCOUNTER — Encounter: Payer: Self-pay | Admitting: Physician Assistant

## 2017-01-22 ENCOUNTER — Encounter: Payer: Self-pay | Admitting: Physician Assistant

## 2017-01-26 ENCOUNTER — Ambulatory Visit: Payer: Self-pay | Admitting: Physician Assistant

## 2017-02-11 ENCOUNTER — Encounter: Payer: Self-pay | Admitting: Nurse Practitioner

## 2017-02-17 ENCOUNTER — Other Ambulatory Visit: Payer: Self-pay | Admitting: Physician Assistant

## 2017-02-17 MED ORDER — CLONIDINE HCL 0.1 MG PO TABS: 0.1000 mg | ORAL_TABLET | Freq: Two times a day (BID) | ORAL | 1 refills | Status: DC

## 2017-03-24 ENCOUNTER — Telehealth: Payer: Self-pay | Admitting: Gastroenterology

## 2017-03-24 ENCOUNTER — Ambulatory Visit: Payer: Medicaid Other | Admitting: Family Medicine

## 2017-03-24 NOTE — Telephone Encounter (Signed)
RECALL FOR ULTRASOUND 

## 2017-03-24 NOTE — Telephone Encounter (Signed)
Letter mailed

## 2017-03-30 ENCOUNTER — Encounter: Payer: Self-pay | Admitting: Cardiology

## 2017-03-30 ENCOUNTER — Ambulatory Visit (INDEPENDENT_AMBULATORY_CARE_PROVIDER_SITE_OTHER): Payer: Medicaid Other | Admitting: Cardiology

## 2017-03-30 VITALS — BP 158/86 | HR 97 | Ht 68.0 in | Wt 192.0 lb

## 2017-03-30 DIAGNOSIS — E782 Mixed hyperlipidemia: Secondary | ICD-10-CM

## 2017-03-30 DIAGNOSIS — I10 Essential (primary) hypertension: Secondary | ICD-10-CM | POA: Diagnosis not present

## 2017-03-30 DIAGNOSIS — I2510 Atherosclerotic heart disease of native coronary artery without angina pectoris: Secondary | ICD-10-CM

## 2017-03-30 MED ORDER — METOPROLOL SUCCINATE ER 50 MG PO TB24: ORAL_TABLET | ORAL | 3 refills | Status: DC

## 2017-03-30 MED ORDER — CHLORTHALIDONE 25 MG PO TABS: 25.0000 mg | ORAL_TABLET | Freq: Every day | ORAL | 3 refills | Status: DC

## 2017-03-30 NOTE — Patient Instructions (Signed)
Medication Instructions:  INCREASE CHLORTHALIDONE TO 25 MG DAILY  DECREASE TOPROL XL TO 50 MG DAILY   Labwork: 2 WEEKS  CMET MAGNESIUM   Testing/Procedures: NONE  Follow-Up: Your physician recommends that you schedule a follow-up appointment in: [place]  Your physician recommends that you schedule a follow-up appointment in: 4 MONTHS     Any Other Special Instructions Will Be Listed Below (If Applicable).  PLEASE BRING YOUR HOME BLOOD PRESSURE CUFF WITH YOU TO YOUR NEXT APPOINTMENT    If you need a refill on your cardiac medications before your next appointment, please call your pharmacy.

## 2017-03-30 NOTE — Progress Notes (Signed)
   Clinical Summary Mr. Kirschenmann is a 61 y.o.male seen today for follow up of the following medical problems.    1. CAD  - cath 04/11/13 showed significant LAD and RCA disease, s/p DES to both - echo 03/2013 with normal LVEF  - compliant w/ meds including ASA and plavix  - describes some feelings of generalized fatigue that has gotten better with decreasing his metoprolol dose. Hx of angioedema on lisinopril   - chronic aching pain left chest 3/10 in severity. Can be sharp pain at times. Typically occurs at rest. No other associated symptom. Better with deep breaths. Can last up to 24 hours without relief. No relation to food. - pain ongoing for 10 years, slow steady progression. Never relieved with stents.  - DOE at 1 block, though mainly limited by chronic hip pain.   - seen at [facility] for chest pain 08/2016. LHC at that time showed only mild CAD along with patent stents.   - last visit lowered imdur to 15mg daily due to headaches, increased Toprol to 75mg daily. Has had some increased fatigue, poor sleep. No recent chest pain  2. HL  - bad reaction to crestor, on only low dose atorvastatin 10 mg.  - 09/2016 TC 176 TG 204 HDL 46 LDL 89 - he is compliant with statin   3. HTN  - elevated last visit. At that time stopped HCTZ and started chlorthalidone 12.5 mg daily, increased Toprol to 75mg daily.  - compliant with meds    Past Medical History:  Diagnosis Date  . Anxiety   . Cirrhosis ([facility])   . Coronary artery disease    a. 03/2013 Cath/PCI: LM nl, LAD 80m (2.5x20 Promus Premier DES), LCX min irregs, RCA dom, 90p(3.0x20 Promus Premier DES), EF 55-65%.  . Essential hypertension, benign   . Helicobacter pylori gastritis JUN 2016 EGD/Bx   PREVPAK BID FOR 14 DAYS  . Hepatitis    Hepatitis C treated   . Hiatal hernia   . Mixed hyperlipidemia   . Skin cancer   . Tobacco abuse      Allergies  Allergen Reactions  . Lisinopril Swelling    Dizziness, chest pain   .  Neosporin [Neomycin-Bacitracin Zn-Polymyx]      Current Outpatient Prescriptions  Medication Sig Dispense Refill  . aspirin EC 81 MG tablet Take 81 mg by mouth daily.    . atorvastatin (LIPITOR) 10 MG tablet Take 1 tablet (10 mg total) by mouth daily. (Patient not taking: Reported on 12/11/2016) 30 tablet 4  . chlorthalidone (HYGROTON) 25 MG tablet Take 12.5 mg daily.(1/2 tablet) 45 tablet 3  . cloNIDine (CATAPRES) 0.1 MG tablet Take 1 tablet (0.1 mg total) by mouth 2 (two) times daily. 60 tablet 1  . clopidogrel (PLAVIX) 75 MG tablet Take 1 tablet (75 mg total) by mouth daily with breakfast. 90 tablet 3  . isosorbide mononitrate (IMDUR) 30 MG 24 hr tablet Take 0.5 tablets (15 mg total) by mouth daily. 45 tablet 3  . metoprolol succinate (TOPROL-XL) 50 MG 24 hr tablet Take 75 mg once daily ( 1 1/2 tablets) Take with or immediately following a meal. 135 tablet 3  . Multiple Vitamin (MULTIVITAMIN) tablet Take 1 tablet by mouth daily.    . nitroGLYCERIN (NITROSTAT) 0.4 MG SL tablet Place 1 tablet (0.4 mg total) under the tongue every 5 (five) minutes as needed for chest pain. 25 tablet 3  . pantoprazole (PROTONIX) 40 MG tablet TAKE ONE TABLET BY MOUTH ONCE   DAILY. 90 tablet 3  . potassium chloride SA (K-DUR,KLOR-CON) 20 MEQ tablet Take 1 tablet (20 mEq total) by mouth daily. 90 tablet 3   No current facility-administered medications for this visit.      Past Surgical History:  Procedure Laterality Date  . BIOPSY N/A 12/19/2014   Procedure: BIOPSY;  Surgeon: Sandi L Fields, MD;  Location: [facility];  Service: Endoscopy;  Laterality: N/A;  . COLONOSCOPY  June 2016   [facility]: with endoscopic mucosal resection. 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 Needs colonoscopy in 1 year.   . COLONOSCOPY WITH PROPOFOL N/A 12/19/2014   Dr. Fields: six simple adenomas and 3 hyperplastic polyps. Had flat mid-transverse colon polyp and referred to [facility] for endoscopic mucosal resection, which is  scheduled for July 22  . CORONARY ANGIOPLASTY WITH STENT PLACEMENT  04/11/2013   LAD &  RCA     DR COOPER  . ESOPHAGOGASTRODUODENOSCOPY (EGD) WITH PROPOFOL N/A 12/19/2014   Dr. Fields:  without varices, small hiatal hernia noted, moderate non-erosive gastritis and mild duodenitis.  POSITIVE H.PYLORI. Prescribed Prevpac.  . HAND RECONSTRUCTION Left   . LEFT HEART CATHETERIZATION WITH CORONARY ANGIOGRAM N/A 04/11/2013   Procedure: LEFT HEART CATHETERIZATION WITH CORONARY ANGIOGRAM;  Surgeon: Michael D Cooper, MD;  Location: [facility] CATH LAB;  Service: Cardiovascular;  Laterality: N/A;  . LEG TENDON SURGERY Right   . POLYPECTOMY N/A 12/19/2014   Procedure: POLYPECTOMY;  Surgeon: Sandi L Fields, MD;  Location: [facility];  Service: Endoscopy;  Laterality: N/A;     Allergies  Allergen Reactions  . Lisinopril Swelling    Dizziness, chest pain   . Neosporin [Neomycin-Bacitracin Zn-Polymyx]       Family History  Problem Relation Age of Onset  . Stroke Mother   . Aneurysm Mother   . Hypertension Mother   . Heart disease Father   . Hypertension Father   . Heart disease Brother   . Hypertension Brother   . Diabetes Paternal Aunt   . Colon cancer Neg Hx      Social History Mr. Ferrer reports that he has been smoking Cigarettes.  He started smoking about 45 years ago. He has a 10.00 pack-year smoking history. He has never used smokeless tobacco. Mr. Jallow reports that he does not drink alcohol.   Review of Systems CONSTITUTIONAL: No weight loss, fever, chills, weakness or fatigue.  HEENT: Eyes: No visual loss, blurred vision, double vision or yellow sclerae.No hearing loss, sneezing, congestion, runny nose or sore throat.  SKIN: No rash or itching.  CARDIOVASCULAR: per hpi RESPIRATORY: No shortness of breath, cough or sputum.  GASTROINTESTINAL: No anorexia, nausea, vomiting or diarrhea. No abdominal pain or blood.  GENITOURINARY: No burning on urination, no polyuria NEUROLOGICAL: No  headache, dizziness, syncope, paralysis, ataxia, numbness or tingling in the extremities. No change in bowel or bladder control.  MUSCULOSKELETAL: No muscle, back pain, joint pain or stiffness.  LYMPHATICS: No enlarged nodes. No history of splenectomy.  PSYCHIATRIC: No history of depression or anxiety.  ENDOCRINOLOGIC: No reports of sweating, cold or heat intolerance. No polyuria or polydipsia.  .   Physical Examination Vitals:   03/30/17 1002  BP: (!) 158/86  Pulse: 97  SpO2: 97%   Vitals:   03/30/17 1002  Weight: 192 lb (87.1 kg)  Height: 5' 8" (1.727 m)    Gen: resting comfortably, no acute distress HEENT: no scleral icterus, pupils equal round and reactive, no palptable cervical adenopathy,  CV: RRR, no m/r/g, no   jvd Resp: Clear to auscultation bilaterally GI: abdomen is soft, non-tender, non-distended, normal bowel sounds, no hepatosplenomegaly MSK: extremities are warm, no edema.  Skin: warm, no rash Neuro:  no focal deficits Psych: appropriate affect   Diagnostic Studies 04/11/13 Cath PROCEDURAL FINDINGS  Hemodynamics:  AO 178/93  LV 177/18  Coronary angiography:  Coronary dominance: right  Left mainstem: Widely patent without obstructive disease  Left anterior descending (LAD): severe mid-vessel stenosis (80%) between the first and second septal perforators. The vessel reaches the LV apex and there is no other significant disease noted. Large D1 is patent.  Left circumflex (LCx): small vessel, supplies 2 OM es. Mild irregularity noted.  Right coronary artery (RCA): large, dominant vessel. There is severe 90% proximal stenosis. Irregularity in the mid-vessel without significant stenosis. The PDA and PLA es are both large without significant disease.  Left ventriculography: Left ventricular systolic function is normal, LVEF is estimated at 55-65%, there is no significant mitral regurgitation  PCI Note: Following the diagnostic procedure, the  decision was made to proceed with PCI. The patient was loaded with plavix 600 mg. Weight-based bivalirudin was given for anticoagulation. I planned on treating the LAD and RCA, both of which have high-grade disease. Once a therapeutic ACT was achieved, a 6 [place] XB-LAD guide catheter was inserted. A cougar coronary guidewire was used to cross the lesion. The lesion was predilated with a 2.0 mm balloon. The lesion was then stented with a 2.5x20 mm Promus Premier drug-eluting stent. The stent was postdilated with a 2.75 mm noncompliant balloon. Following PCI, there was 0% residual stenosis and TIMI-3 flow. Attention was then turned to the RCA. A JR-4 guide was used. The same cougar guidewire was used to cross the lesion. The lesion was dilated with a 2.0 mm balloon and stented with a 3.0x20 mm Promus Premier DES. The stent was post-dilated to 18 atm with a 3.25 mm [place] balloon. Final angiography confirmed an excellent result. The patient tolerated the procedure well. There were no immediate procedural complications. A TR band was used for radial hemostasis. The patient was transferred to the post catheterization recovery area for further monitoring.  PCI Data:  Lesion 1  Vessel - LAD/Segment - mid  Percent Stenosis (pre) 80  TIMI-flow 3  Stent 2.5x20 mm Promus Premier DES  Percent Stenosis (post) 0  TIMI-flow (post) 3  Lesion 2  Vessel - RCA/Segment - prox  Percent Stenosis (pre) 90  TIMI-flow 3  Stent 3.0x20 mm Promus Premier DES  Percent Stenosis (post) 0  TIMI-flow (post) 3  Final Conclusions:  Severe 2 vessel CAD with successful PCI of the LAD and RCA  Normal LV function  Recommendations:  ASA and plavix for at least 12 months.   04/04/13 Echo LVEF 60-65%, no WMA, grade I diastolic dysfunction    10/2014 Exercise Nuclear Stress IMPRESSION: 1. No reversible ischemia or infarction. Submaximal stress test, thus diminishing the ability of this test to detect  ischemia.  2. Normal left ventricular wall motion.  3. Left ventricular ejection fraction 78%  4. Low-risk stress test findings*. Consider Lexiscan in the future.   08/2016 [facility] cath Angiography: LVgram: not performed LCA: LAD stent patent, mild luminal irregularites, no significant obstructive disease RCA: proximal stent patent, large R dominant RCA,mild luminal irregularities in distal   Impression:   Mild CAD with Patent Stents RCA and LAD Normal resting LVedp (14 mm)  Plan:  Follow-up with referring physician. Echocardiogram suggested to evaluate possible HFrEF as cause for symptoms      Assessment and Plan  1. CAD  - s/p DES to LAD and DES to RCA in setting of angina  - he has favored continuing plavix, which given his stent burden is reasonable - long history of atypical chest pain. Recent cath 08/2016 at [facility] without significant CAD  - we will lower TOprol to 50mg daily due to fatigue.  - continue to monitor symptoms.   2. HTN  - remains elevated. Increase chlorthalidone to 25mg daily. Lower Toprol dose to 50mg daily due to fatigue. Check CMET/Mg in 2 weeks - bp check in 2 weeks with nursing visit  3. HL  - continue statin   F/u 4 months. Order CMET/Mg       F. , M.D. 

## 2017-04-02 ENCOUNTER — Other Ambulatory Visit: Payer: Self-pay

## 2017-04-02 DIAGNOSIS — K7460 Unspecified cirrhosis of liver: Secondary | ICD-10-CM

## 2017-04-07 ENCOUNTER — Ambulatory Visit (HOSPITAL_COMMUNITY)
Admission: RE | Admit: 2017-04-07 | Discharge: 2017-04-07 | Disposition: A | Discharge status: Home / Self Care | Payer: Medicaid Other | Source: Ambulatory Visit | Attending: Gastroenterology | Admitting: Gastroenterology

## 2017-04-07 DIAGNOSIS — K7460 Unspecified cirrhosis of liver: Secondary | ICD-10-CM | POA: Diagnosis present

## 2017-04-07 DIAGNOSIS — K760 Fatty (change of) liver, not elsewhere classified: Secondary | ICD-10-CM | POA: Insufficient documentation

## 2017-04-07 NOTE — Progress Notes (Signed)
Phone number not working. Mailed letter to call.

## 2017-04-07 NOTE — Progress Notes (Signed)
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 Although Hep C has been treated, I would recommend serial ultrasounds due to concern for an F3 component and progression of fibrosis.

## 2017-04-13 ENCOUNTER — Ambulatory Visit (INDEPENDENT_AMBULATORY_CARE_PROVIDER_SITE_OTHER): Payer: Medicaid Other

## 2017-04-13 VITALS — BP 132/82 | HR 77 | Ht 68.0 in | Wt 190.0 lb

## 2017-04-13 DIAGNOSIS — I10 Essential (primary) hypertension: Secondary | ICD-10-CM | POA: Diagnosis not present

## 2017-04-13 NOTE — Progress Notes (Signed)
Pt came in for blood pressure check. He states that he is feeling well. He brought in his home blood pressure monitor in to check it for accuracy. He has no complaints today.

## 2017-04-14 NOTE — Patient Instructions (Signed)
Received fax from [facility]. US abdomen was approved. PA# [id number], 04/06/17-05/06/17.

## 2017-04-14 NOTE — Progress Notes (Signed)
BP looks good, no changes   Zandra Abts MD

## 2017-04-21 ENCOUNTER — Encounter: Payer: Self-pay | Admitting: Gastroenterology

## 2017-04-21 ENCOUNTER — Ambulatory Visit (INDEPENDENT_AMBULATORY_CARE_PROVIDER_SITE_OTHER): Payer: Medicaid Other | Admitting: Gastroenterology

## 2017-04-21 VITALS — BP 157/91 | HR 94 | Temp 97.6°F | Ht 68.0 in | Wt 187.0 lb

## 2017-04-21 DIAGNOSIS — K635 Polyp of colon: Secondary | ICD-10-CM | POA: Diagnosis not present

## 2017-04-21 DIAGNOSIS — B182 Chronic viral hepatitis C: Secondary | ICD-10-CM

## 2017-04-21 NOTE — Progress Notes (Signed)
Referring Provider: Soyla Dryer, PA-C Primary Care Physician:  Raylene Everts, MD Primary GI: Dr. Oneida Alar   Chief Complaint  Patient presents with  . Follow-up    hepatitis    HPI:   Duane Adams is a 61 y.o. male presenting today with a history of Hep C, fibrosis score F2/F3, s/p Harvoni treatment in 2016. Documented SVR achieved.  Had endoscopic mucosal resection at [facility] for tubular adenoma and focal high grade dysplasia in June 2016 and recently completed surveillance over the summer at [facility]. Will need to retrieve notes.   No ETOH. States it makes him sick when he drinks. One beer and he feels bad. Protonix once daily. Belly feels like on the edge of getting sick all the time. Just doesn't feel right. No pain. No constipation or diarrhea. No weight loss. Can't exercise due to DOE, joint pain. Smoking. Denying any further evaluation for abdominal discomfort.   Past Medical History:  Diagnosis Date  . Anxiety   . Cirrhosis ([place])   . Coronary artery disease    a. 03/2013 Cath/PCI: LM nl, LAD 41m (2.5x20 Promus Premier DES), LCX min irregs, RCA dom, 90p(3.0x20 Promus Premier DES), EF 55-65%.  . Essential hypertension, benign   . Helicobacter pylori gastritis JUN 2016 EGD/Bx   PREVPAK BID FOR 14 DAYS  . Hepatitis    Hepatitis C treated   . Hiatal hernia   . Mixed hyperlipidemia   . Skin cancer   . Tobacco abuse     Past Surgical History:  Procedure Laterality Date  . BIOPSY N/A 12/19/2014   Procedure: BIOPSY;  Surgeon: Danie Binder, MD;  Location: [facility];  Service: Endoscopy;  Laterality: N/A;  . COLONOSCOPY  June 2016   [facility]: with endoscopic mucosal resection. 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 Needs colonoscopy in 1 year.   . COLONOSCOPY WITH PROPOFOL N/A 12/19/2014   Dr. Oneida Alar: six simple adenomas and 3 hyperplastic polyps. Had flat mid-transverse colon polyp and referred to [facility] for endoscopic mucosal resection, which is  scheduled for July 22  . CORONARY ANGIOPLASTY WITH STENT PLACEMENT  04/11/2013   LAD &  RCA     DR COOPER  . ESOPHAGOGASTRODUODENOSCOPY (EGD) WITH PROPOFOL N/A 12/19/2014   Dr. Oneida Alar:  without varices, small hiatal hernia noted, moderate non-erosive gastritis and mild duodenitis.  POSITIVE H.PYLORI. Prescribed Prevpac.  Marland Kitchen HAND RECONSTRUCTION Left   . LEFT HEART CATHETERIZATION WITH CORONARY ANGIOGRAM N/A 04/11/2013   Procedure: LEFT HEART CATHETERIZATION WITH CORONARY ANGIOGRAM;  Surgeon: Blane Ohara, MD;  Location: [facility] CATH LAB;  Service: Cardiovascular;  Laterality: N/A;  . LEG TENDON SURGERY Right   . POLYPECTOMY N/A 12/19/2014   Procedure: POLYPECTOMY;  Surgeon: Danie Binder, MD;  Location: [facility];  Service: Endoscopy;  Laterality: N/A;    Current Outpatient Prescriptions  Medication Sig Dispense Refill  . aspirin EC 81 MG tablet Take 81 mg by mouth daily.    Marland Kitchen atorvastatin (LIPITOR) 10 MG tablet Take 1 tablet (10 mg total) by mouth daily. 30 tablet 4  . chlorthalidone (HYGROTON) 25 MG tablet Take 1 tablet (25 mg total) by mouth daily. Take 12.5 mg daily.(1/2 tablet) (Patient taking differently: Take 25 mg by mouth daily. ) 90 tablet 3  . cloNIDine (CATAPRES) 0.1 MG tablet Take 1 tablet (0.1 mg total) by mouth 2 (two) times daily. 60 tablet 1  . clopidogrel (PLAVIX) 75 MG tablet Take 1 tablet (75 mg total) by mouth  daily with breakfast. 90 tablet 3  . isosorbide mononitrate (IMDUR) 30 MG 24 hr tablet Take 0.5 tablets (15 mg total) by mouth daily. 45 tablet 3  . metoprolol succinate (TOPROL-XL) 50 MG 24 hr tablet Take 50 mg daily. ( 1 tablet) 90 tablet 3  . Multiple Vitamin (MULTIVITAMIN) tablet Take 1 tablet by mouth daily.    . nitroGLYCERIN (NITROSTAT) 0.4 MG SL tablet Place 1 tablet (0.4 mg total) under the tongue every 5 (five) minutes as needed for chest pain. 25 tablet 3  . pantoprazole (PROTONIX) 40 MG tablet TAKE ONE TABLET BY MOUTH ONCE DAILY. 90 tablet 3  . potassium chloride  SA (K-DUR,KLOR-CON) 20 MEQ tablet Take 1 tablet (20 mEq total) by mouth daily. 90 tablet 3   No current facility-administered medications for this visit.     Allergies as of 04/21/2017 - Review Complete 04/21/2017  Allergen Reaction Noted  . Lisinopril Swelling 08/18/2012  . Neosporin [neomycin-bacitracin zn-polymyx]  10/28/2013    Family History  Problem Relation Age of Onset  . Stroke Mother   . Aneurysm Mother   . Hypertension Mother   . Heart disease Father   . Hypertension Father   . Heart disease Brother   . Hypertension Brother   . Diabetes Paternal Aunt   . Colon cancer Neg Hx     Social History   Social History  . Marital status: Single    Spouse name: N/A  . Number of children: N/A  . Years of education: N/A   Social History Main Topics  . Smoking status: Current Every Day Smoker    Packs/day: 0.25    Years: 40.00    Types: Cigarettes    Start date: 10/10/1971  . Smokeless tobacco: Never Used  . Alcohol use No  . Drug use: No  . Sexual activity: Not Asked   Other Topics Concern  . None   Social History Narrative  . None    Review of Systems: Gen: Denies fever, chills, anorexia. Denies fatigue, weakness, weight loss.  CV: Denies chest pain, palpitations, syncope, peripheral edema, and claudication. Resp: Denies dyspnea at rest, cough, wheezing, coughing up blood, and pleurisy. GI: see HPI  Derm: Denies rash, itching, dry skin Psych: Denies depression, anxiety, memory loss, confusion. No homicidal or suicidal ideation.  Heme: Denies bruising, bleeding, and enlarged lymph nodes.  Physical Exam: BP (!) 157/91   Pulse 94   Temp 97.6 F (36.4 C) (Oral)   Ht 5\' 8"  (1.727 m)   Wt 187 lb (84.8 kg)   BMI 28.43 kg/m  General:   Alert and oriented. No distress noted. Pleasant and cooperative.  Head:  Normocephalic and atraumatic. Eyes:  Conjuctiva clear without scleral icterus. Mouth:  Oral mucosa pink and moist. Good dentition. No  lesions. Abdomen:  +BS, soft, non-tender and non-distended. No rebound or guarding. No HSM or masses noted. Msk:  Symmetrical without gross deformities. Normal posture. Extremities:  Without edema. Neurologic:  Alert and  oriented x4 Psych:  Alert and cooperative. Normal mood and affect.

## 2017-04-21 NOTE — Progress Notes (Signed)
cc'ed to pcp °

## 2017-04-21 NOTE — Patient Instructions (Signed)
Let me know if you want any further evaluation of your abdomen. We will order a routine ultrasound in 6 months.  We will see you in 1 year!

## 2017-04-21 NOTE — Assessment & Plan Note (Signed)
History of multiple adenomas and EMR of adenoma with focal high grade dysplasia in 2016, recently s/p surveillance colonoscopy earlier this year. Will obtain records.

## 2017-04-21 NOTE — Assessment & Plan Note (Signed)
Completed Harvonin 2016. Documented SVR earlier this year. [place] elastography on file F2/F3. Will follow with serial ultrasounds, as he may have some component of F3 and the risk of cirrhosis advancement from an F3. However, he has no stigmata of advanced liver disease. Continue to avoid alcohol. Discussed that although he was treated for Hep C, he could contract this again. He stated understanding and is not engaging in any risk factors. Return in 1 year, and ultrasound in 6 months.

## 2017-05-05 ENCOUNTER — Encounter: Payer: Self-pay | Admitting: Family Medicine

## 2017-05-05 ENCOUNTER — Ambulatory Visit (INDEPENDENT_AMBULATORY_CARE_PROVIDER_SITE_OTHER): Payer: Medicaid Other | Admitting: Family Medicine

## 2017-05-05 VITALS — BP 144/78 | HR 88 | Temp 96.6°F | Resp 16 | Ht 68.0 in | Wt 188.0 lb

## 2017-05-05 DIAGNOSIS — K7030 Alcoholic cirrhosis of liver without ascites: Secondary | ICD-10-CM

## 2017-05-05 DIAGNOSIS — Z23 Encounter for immunization: Secondary | ICD-10-CM | POA: Diagnosis not present

## 2017-05-05 DIAGNOSIS — K7460 Unspecified cirrhosis of liver: Secondary | ICD-10-CM | POA: Insufficient documentation

## 2017-05-05 DIAGNOSIS — I10 Essential (primary) hypertension: Secondary | ICD-10-CM

## 2017-05-05 DIAGNOSIS — I739 Peripheral vascular disease, unspecified: Secondary | ICD-10-CM | POA: Diagnosis not present

## 2017-05-05 DIAGNOSIS — I25119 Atherosclerotic heart disease of native coronary artery with unspecified angina pectoris: Secondary | ICD-10-CM

## 2017-05-05 DIAGNOSIS — K7400 Hepatic fibrosis, unspecified: Secondary | ICD-10-CM | POA: Insufficient documentation

## 2017-05-05 DIAGNOSIS — Z8619 Personal history of other infectious and parasitic diseases: Secondary | ICD-10-CM | POA: Diagnosis not present

## 2017-05-05 NOTE — Progress Notes (Signed)
Chief Complaint  Patient presents with  . Hypertension  . PVD  new patient to clinic History of alcoholism and hepatitis C, treated and now clear of virus.  Has been told he has early cirrhosis/fibrosis of liver.  Is compliant with NOT drinking alcohol.  Under care [facility].  Also had history of H pylori and tubular adenoma-multiple He has hypertension, hyperlipidemia and coronary artery disease with stents.  Sees Dr Harl Bowie.  Infrequent chest pain.  Compliant with medicines but still smokes cigarettes.   I have discussed the multiple health risks associated with cigarette smoking including, but not limited to, cardiovascular disease, lung disease and cancer.  I have strongly recommended that smoking be stopped.  I have reviewed the various methods of quitting including cold [place], classes, nicotine replacements and prescription medications.  I have offered assistance in this difficult process.  The patient is not interested in assistance at this time. He has calf pain with walking.  This limits his ability to walk more than one block.  He has not been evaluated for this.  Needs ABI and referral to vascular surgery. He is a long term smoker.  Has not had lung cancer screening   Patient Active Problem List   Diagnosis Date Noted  . PAD (peripheral artery disease) ([place]) 05/05/2017  . Intermittent claudication ([place]) 05/05/2017  . Hepatitis C virus infection resolved after antiviral drug therapy 05/05/2017  . Cirrhosis of liver ([place]) 05/05/2017  . Esophageal reflux 07/23/2015  . Nicotine dependence, cigarettes, with other nicotine-induced disorders 07/23/2015  . Hyperlipidemia 06/13/2015  . Colon polyps 01/14/2015  . Essential hypertension 04/12/2013  . Coronary artery disease   . Unspecified malignant neoplasm of skin of lower limb, including hip 03/16/2013  . Other and unspecified hyperlipidemia 03/16/2013    Outpatient Encounter Prescriptions as of 05/05/2017  Medication Sig  .  aspirin EC 81 MG tablet Take 81 mg by mouth daily.  Marland Kitchen atorvastatin (LIPITOR) 10 MG tablet Take 1 tablet (10 mg total) by mouth daily.  . chlorthalidone (HYGROTON) 25 MG tablet Take 1 tablet (25 mg total) by mouth daily. Take 12.5 mg daily.(1/2 tablet) (Patient taking differently: Take 25 mg by mouth daily. )  . cloNIDine (CATAPRES) 0.1 MG tablet Take 1 tablet (0.1 mg total) by mouth 2 (two) times daily.  . clopidogrel (PLAVIX) 75 MG tablet Take 1 tablet (75 mg total) by mouth daily with breakfast.  . isosorbide mononitrate (IMDUR) 30 MG 24 hr tablet Take 0.5 tablets (15 mg total) by mouth daily.  . metoprolol succinate (TOPROL-XL) 50 MG 24 hr tablet Take 50 mg daily. ( 1 tablet)  . Multiple Vitamin (MULTIVITAMIN) tablet Take 1 tablet by mouth daily.  . nitroGLYCERIN (NITROSTAT) 0.4 MG SL tablet Place 1 tablet (0.4 mg total) under the tongue every 5 (five) minutes as needed for chest pain.  . pantoprazole (PROTONIX) 40 MG tablet TAKE ONE TABLET BY MOUTH ONCE DAILY.  Marland Kitchen potassium chloride SA (K-DUR,KLOR-CON) 20 MEQ tablet Take 1 tablet (20 mEq total) by mouth daily.   No facility-administered encounter medications on file as of 05/05/2017.     Past Medical History:  Diagnosis Date  . Allergy   . Anxiety   . Cirrhosis ([place])   . Coronary artery disease    a. 03/2013 Cath/PCI: LM nl, LAD 2m (2.5x20 Promus Premier DES), LCX min irregs, RCA dom, 90p(3.0x20 Promus Premier DES), EF 55-65%.  . Essential hypertension, benign   . GERD (gastroesophageal reflux disease)   . Helicobacter  pylori gastritis JUN 2016 EGD/Bx   PREVPAK BID FOR 14 DAYS  . Hepatitis    Hepatitis C treated   . Hiatal hernia   . Mixed hyperlipidemia   . Skin cancer   . Substance abuse ([place])    alcoholic quit 2202  . Tobacco abuse     Past Surgical History:  Procedure Laterality Date  . BIOPSY N/A 12/19/2014   Procedure: BIOPSY;  Surgeon: Danie Binder, MD;  Location: [facility];  Service: Endoscopy;  Laterality: N/A;  .  COLONOSCOPY  June 2016   [facility]: with endoscopic mucosal resection. 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 Needs colonoscopy in 1 year.   . COLONOSCOPY WITH PROPOFOL N/A 12/19/2014   Dr. Oneida Alar: six simple adenomas and 3 hyperplastic polyps. Had flat mid-transverse colon polyp and referred to [facility] for endoscopic mucosal resection, which is scheduled for July 22  . CORONARY ANGIOPLASTY WITH STENT PLACEMENT  04/11/2013   LAD &  RCA     DR COOPER  . ESOPHAGOGASTRODUODENOSCOPY (EGD) WITH PROPOFOL N/A 12/19/2014   Dr. Oneida Alar:  without varices, small hiatal hernia noted, moderate non-erosive gastritis and mild duodenitis.  POSITIVE H.PYLORI. Prescribed Prevpac.  Marland Kitchen HAND RECONSTRUCTION Left   . LEFT HEART CATHETERIZATION WITH CORONARY ANGIOGRAM N/A 04/11/2013   Procedure: LEFT HEART CATHETERIZATION WITH CORONARY ANGIOGRAM;  Surgeon: Blane Ohara, MD;  Location: [facility] CATH LAB;  Service: Cardiovascular;  Laterality: N/A;  . LEG TENDON SURGERY Right   . POLYPECTOMY N/A 12/19/2014   Procedure: POLYPECTOMY;  Surgeon: Danie Binder, MD;  Location: [facility];  Service: Endoscopy;  Laterality: N/A;    Social History   Social History  . Marital status: Soil scientist    Spouse name: Judie Petit  . Number of children: 2  . Years of education: 14   Occupational History  . disabled     heart   Social History Main Topics  . Smoking status: Current Every Day Smoker    Packs/day: 0.25    Years: 40.00    Types: Cigarettes    Start date: 07/14/1968  . Smokeless tobacco: Never Used  . Alcohol use No  . Drug use: No  . Sexual activity: Not Currently   Other Topics Concern  . Not on file   Social History Narrative   Disabled from heart disease   Previously worked in Scientist, research (medical) with Judie Petit for 28 years   Leisure: computer       Family History  Problem Relation Age of Onset  . Stroke Mother   . Aneurysm Mother   . Hypertension Mother   . Heart disease Father   . Hypertension  Father   . Heart disease Brother   . Hypertension Brother   . Alcohol abuse Brother   . Diabetes Paternal Aunt   . Colon cancer Neg Hx     Review of Systems  Constitutional: Negative for chills, fever and weight loss.  HENT: Negative for congestion and hearing loss.   Eyes: Negative for blurred vision and pain.  Respiratory: Positive for shortness of breath. Negative for cough.   Cardiovascular: Positive for chest pain and claudication. Negative for leg swelling.  Gastrointestinal: Negative for abdominal pain, constipation, diarrhea and heartburn.  Genitourinary: Negative for dysuria and frequency.  Musculoskeletal: Negative for falls, joint pain and myalgias.  Neurological: Negative for dizziness, seizures and headaches.  Psychiatric/Behavioral: Negative for depression. The patient is not nervous/anxious and does not have insomnia.     BP Marland Kitchen)  144/78   Pulse 88   Temp (!) 96.6 F (35.9 C) (Temporal)   Resp 16   Ht 5\' 8"  (1.727 m)   Wt 188 lb 0.6 oz (85.3 kg)   SpO2 98%   BMI 28.59 kg/m   Physical Exam  Constitutional: He is oriented to person, place, and time. He appears well-developed and well-nourished.  HENT:  Head: Normocephalic and atraumatic.  Mouth/Throat: Oropharynx is clear and moist.  Eyes: Pupils are equal, round, and reactive to light. Conjunctivae are normal.  Neck: Normal range of motion. Neck supple. No thyromegaly present.  Cardiovascular: Normal rate, regular rhythm and normal heart sounds.   No pedal pusles  Pulmonary/Chest: Effort normal and breath sounds normal. No respiratory distress.  Abdominal: Soft. Bowel sounds are normal.  Musculoskeletal: Normal range of motion. He exhibits no edema.  Lymphadenopathy:    He has no cervical adenopathy.  Neurological: He is alert and oriented to person, place, and time.  Gait normal  Skin: Skin is warm and dry.  Psychiatric: He has a normal mood and affect. His behavior is normal. Thought content normal.    Nursing note and vitals reviewed.   1. PAD (peripheral artery disease) ([place]) - Ambulatory referral to Vascular Surgery  2. Intermittent claudication ([place]) - Ambulatory referral to Vascular Surgery  3. Hepatitis C virus infection resolved after antiviral drug therapy  4. Alcoholic cirrhosis of liver without ascites ([place])  5. Coronary artery disease involving native coronary artery of native heart with angina pectoris (HCC) - CBC - COMPLETE METABOLIC PANEL WITH GFR - Lipid panel - VITAMIN D 25 Hydroxy (Vit-D Deficiency, Fractures) - Urinalysis, Routine w reflex microscopic  6. Essential hypertension  7. Nicotine dependence  Greater than 50% of this visit was spent in counseling and coordinating care.  Total face to face time:  45 min spent in discussing health risks of smoking with vascular disease, the workup of claudication and poss treatment options, chart review   Patient Instructions  I am referring you to a vascular surgeon for the leg cramping No change in medicines You are due for lab testing today Try to stop smoking! ( at least cut down)  Vaccinations today  See me in a month for a PE   Raylene Everts, MD

## 2017-05-05 NOTE — Patient Instructions (Signed)
I am referring you to a vascular surgeon for the leg cramping No change in medicines You are due for lab testing today Try to stop smoking! ( at least cut down)  Vaccinations today  See me in a month for a PE

## 2017-05-06 LAB — URINALYSIS, ROUTINE W REFLEX MICROSCOPIC
Bacteria, UA: NONE SEEN /HPF
Bilirubin Urine: NEGATIVE
GLUCOSE, UA: NEGATIVE
HYALINE CAST: NONE SEEN /LPF
Hgb urine dipstick: NEGATIVE
Nitrite: NEGATIVE
PH: 7 (ref 5.0–8.0)
Specific Gravity, Urine: 1.028 (ref 1.001–1.03)
Squamous Epithelial / LPF: NONE SEEN /HPF (ref <=5)
WBC UA: NONE SEEN /HPF (ref 0–5)

## 2017-05-06 LAB — COMPLETE METABOLIC PANEL WITH GFR
AG RATIO: 1.6 (calc) (ref 1.0–2.5)
ALKALINE PHOSPHATASE (APISO): 52 U/L (ref 40–115)
ALT: 15 U/L (ref 9–46)
AST: 15 U/L (ref 10–35)
Albumin: 4.4 g/dL (ref 3.6–5.1)
BILIRUBIN TOTAL: 0.4 mg/dL (ref 0.2–1.2)
BUN: 18 mg/dL (ref 7–25)
CALCIUM: 9.6 mg/dL (ref 8.6–10.3)
CHLORIDE: 101 mmol/L (ref 98–110)
CO2: 29 mmol/L (ref 20–32)
Creat: 0.94 mg/dL (ref 0.70–1.25)
GFR, EST AFRICAN AMERICAN: 102 mL/min/{1.73_m2} (ref >=60)
GFR, EST NON AFRICAN AMERICAN: 88 mL/min/{1.73_m2} (ref >=60)
GLOBULIN: 2.7 g/dL (ref 1.9–3.7)
Glucose, Bld: 149 mg/dL — ABNORMAL HIGH (ref 65–139)
Potassium: 4 mmol/L (ref 3.5–5.3)
Sodium: 139 mmol/L (ref 135–146)
Total Protein: 7.1 g/dL (ref 6.1–8.1)

## 2017-05-06 LAB — CBC
HEMATOCRIT: 46.2 % (ref 38.5–50.0)
Hemoglobin: 16 g/dL (ref 13.2–17.1)
MCH: 30.6 pg (ref 27.0–33.0)
MCHC: 34.6 g/dL (ref 32.0–36.0)
MCV: 88.3 fL (ref 80.0–100.0)
MPV: 10.5 fL (ref 7.5–12.5)
Platelets: 225 10*3/uL (ref 140–400)
RBC: 5.23 10*6/uL (ref 4.20–5.80)
RDW: 13.1 % (ref 11.0–15.0)
WBC: 10.6 10*3/uL (ref 3.8–10.8)

## 2017-05-06 LAB — LIPID PANEL
Cholesterol: 178 mg/dL (ref <200)
HDL: 36 mg/dL — ABNORMAL LOW (ref >40)
LDL CHOLESTEROL (CALC): 106 mg/dL — AB
NON-HDL CHOLESTEROL (CALC): 142 mg/dL — AB (ref <130)
TRIGLYCERIDES: 254 mg/dL — AB (ref <150)
Total CHOL/HDL Ratio: 4.9 (calc) (ref <5.0)

## 2017-05-06 LAB — VITAMIN D 25 HYDROXY (VIT D DEFICIENCY, FRACTURES): VIT D 25 HYDROXY: 17 ng/mL — AB (ref 30–100)

## 2017-05-14 ENCOUNTER — Other Ambulatory Visit: Payer: Self-pay

## 2017-05-14 DIAGNOSIS — I739 Peripheral vascular disease, unspecified: Secondary | ICD-10-CM

## 2017-05-25 ENCOUNTER — Telehealth: Payer: Self-pay | Admitting: Family Medicine

## 2017-05-25 MED ORDER — ATORVASTATIN CALCIUM 10 MG PO TABS: 10.0000 mg | ORAL_TABLET | Freq: Every day | ORAL | 3 refills | Status: DC

## 2017-05-25 MED ORDER — CLONIDINE HCL 0.1 MG PO TABS: 0.1000 mg | ORAL_TABLET | Freq: Two times a day (BID) | ORAL | 1 refills | Status: DC

## 2017-05-25 NOTE — Telephone Encounter (Signed)
Seen 10 23 18

## 2017-05-25 NOTE — Telephone Encounter (Signed)
Patients wife called in to request refill for atorvastatin and clonidine refills Cb#: [phone]

## 2017-06-03 ENCOUNTER — Encounter: Payer: Self-pay | Admitting: Vascular Surgery

## 2017-06-03 ENCOUNTER — Encounter: Payer: Self-pay | Admitting: *Deleted

## 2017-06-03 ENCOUNTER — Ambulatory Visit (INDEPENDENT_AMBULATORY_CARE_PROVIDER_SITE_OTHER): Payer: Medicaid Other | Admitting: Vascular Surgery

## 2017-06-03 ENCOUNTER — Ambulatory Visit (HOSPITAL_COMMUNITY)
Admission: RE | Admit: 2017-06-03 | Discharge: 2017-06-03 | Disposition: A | Discharge status: Home / Self Care | Payer: Medicaid Other | Source: Ambulatory Visit | Attending: Vascular Surgery | Admitting: Vascular Surgery

## 2017-06-03 ENCOUNTER — Other Ambulatory Visit: Payer: Self-pay | Admitting: *Deleted

## 2017-06-03 VITALS — BP 120/91 | HR 98 | Temp 98.1°F | Resp 20 | Ht 68.0 in | Wt 191.0 lb

## 2017-06-03 DIAGNOSIS — I10 Essential (primary) hypertension: Secondary | ICD-10-CM | POA: Insufficient documentation

## 2017-06-03 DIAGNOSIS — R9389 Abnormal findings on diagnostic imaging of other specified body structures: Secondary | ICD-10-CM | POA: Insufficient documentation

## 2017-06-03 DIAGNOSIS — E785 Hyperlipidemia, unspecified: Secondary | ICD-10-CM | POA: Insufficient documentation

## 2017-06-03 DIAGNOSIS — I739 Peripheral vascular disease, unspecified: Secondary | ICD-10-CM | POA: Diagnosis not present

## 2017-06-03 DIAGNOSIS — I7409 Other arterial embolism and thrombosis of abdominal aorta: Secondary | ICD-10-CM

## 2017-06-03 DIAGNOSIS — R0989 Other specified symptoms and signs involving the circulatory and respiratory systems: Secondary | ICD-10-CM | POA: Insufficient documentation

## 2017-06-03 DIAGNOSIS — F17200 Nicotine dependence, unspecified, uncomplicated: Secondary | ICD-10-CM | POA: Diagnosis not present

## 2017-06-03 NOTE — Progress Notes (Signed)
Patient name: Duane Adams MRN: [id number] DOB: October 20, 1955 Sex: male   REASON FOR CONSULT:    Peripheral vascular disease with claudication.  The consult is requested by Dr. Meda Coffee.  HPI:   Duane Adams is a pleasant 61 y.o. male, who was referred with bilateral lower extremity claudication.  He has been having symptoms for over a year.  Over the last several months the symptoms have gradually progressed.  He describes pain in his calves, thighs, and hips bilaterally which is brought on by ambulation and relieved with rest.  His symptoms are equal on both sides.  He can walk about 30-40 feet before experiencing symptoms.  He denies any history of rest pain or nonhealing ulcers.  His risk factors for peripheral vascular disease include hypertension, hypercholesterolemia, a family history of premature cardiovascular disease, and tobacco use.  He denies any history of diabetes.  He does have a history of coronary artery disease and is followed by Dr. Harl Bowie.  He has had previous PTCA and is on Plavix.  He is also had previous TIAs and his carotid disease is followed by Dr. Harl Bowie.   I have reviewed the records that were sent from the referring office.  The patient was seen on 05/05/2017 for continued follow-up of hypertension and peripheral vascular disease.  The patient has a history of alcoholism with hepatitis C and does have early cirrhosis of the liver.  Blood pressure and hyperlipidemia have been under good control.  Past Medical History:  Diagnosis Date  . Allergy   . Anxiety   . Cirrhosis ([place])   . Coronary artery disease    a. 03/2013 Cath/PCI: LM nl, LAD 9m (2.5x20 Promus Premier DES), LCX min irregs, RCA dom, 90p(3.0x20 Promus Premier DES), EF 55-65%.  . Essential hypertension, benign   . GERD (gastroesophageal reflux disease)   . Helicobacter pylori gastritis JUN 2016 EGD/Bx   PREVPAK BID FOR 14 DAYS  . Hepatitis    Hepatitis C treated   . Hiatal hernia   . Mixed  hyperlipidemia   . Skin cancer   . Substance abuse ([place])    alcoholic quit 7253  . Tobacco abuse     Family History  Problem Relation Age of Onset  . Stroke Mother   . Aneurysm Mother   . Hypertension Mother   . Heart disease Father   . Hypertension Father   . Heart disease Brother   . Hypertension Brother   . Alcohol abuse Brother   . Diabetes Paternal Aunt   . Colon cancer Neg Hx     SOCIAL HISTORY: Social History   Socioeconomic History  . Marital status: Soil scientist    Spouse name: Duane Adams  . Number of children: 2  . Years of education: 63  . Highest education level: Not on file  Social Needs  . Financial resource strain: Not on file  . Food insecurity - worry: Not on file  . Food insecurity - inability: Not on file  . Transportation needs - medical: Not on file  . Transportation needs - non-medical: Not on file  Occupational History  . Occupation: disabled    Comment: heart  Tobacco Use  . Smoking status: Current Every Day Smoker    Packs/day: 0.25    Years: 40.00    Pack years: 10.00    Types: Cigarettes    Start date: 07/14/1968  . Smokeless tobacco: Never Used  . Tobacco comment: 5-6 Cigarettes per day  Substance and Sexual  Activity  . Alcohol use: No    Alcohol/week: 0.0 oz  . Drug use: No  . Sexual activity: Not Currently  Other Topics Concern  . Not on file  Social History Narrative   Disabled from heart disease   Previously worked in Scientist, research (medical) with Duane Adams for 28 years   Leisure: computer    Allergies  Allergen Reactions  . Lisinopril Swelling    Swelling neck  . Neosporin [Neomycin-Bacitracin Zn-Polymyx] Rash    blistered    Current Outpatient Medications  Medication Sig Dispense Refill  . aspirin EC 81 MG tablet Take 81 mg by mouth daily.    Marland Kitchen atorvastatin (LIPITOR) 10 MG tablet Take 1 tablet (10 mg total) daily by mouth. 90 tablet 3  . chlorthalidone (HYGROTON) 25 MG tablet Take 1 tablet (25 mg total) by mouth daily. Take  12.5 mg daily.(1/2 tablet) (Patient taking differently: Take 25 mg by mouth daily. ) 90 tablet 3  . cloNIDine (CATAPRES) 0.1 MG tablet Take 1 tablet (0.1 mg total) 2 (two) times daily by mouth. 180 tablet 1  . clopidogrel (PLAVIX) 75 MG tablet Take 1 tablet (75 mg total) by mouth daily with breakfast. 90 tablet 3  . isosorbide mononitrate (IMDUR) 30 MG 24 hr tablet Take 0.5 tablets (15 mg total) by mouth daily. 45 tablet 3  . metoprolol succinate (TOPROL-XL) 50 MG 24 hr tablet Take 50 mg daily. ( 1 tablet) 90 tablet 3  . Multiple Vitamin (MULTIVITAMIN) tablet Take 1 tablet by mouth daily.    . nitroGLYCERIN (NITROSTAT) 0.4 MG SL tablet Place 1 tablet (0.4 mg total) under the tongue every 5 (five) minutes as needed for chest pain. 25 tablet 3  . pantoprazole (PROTONIX) 40 MG tablet TAKE ONE TABLET BY MOUTH ONCE DAILY. 90 tablet 3  . potassium chloride SA (K-DUR,KLOR-CON) 20 MEQ tablet Take 1 tablet (20 mEq total) by mouth daily. 90 tablet 3   No current facility-administered medications for this visit.     REVIEW OF SYSTEMS:  [X]  denotes positive finding, [ ]  denotes negative finding Cardiac  Comments:  Chest pain or chest pressure: X  stable  Shortness of breath upon exertion: X   Short of breath when lying flat:    Irregular heart rhythm:        Vascular    Pain in calf, thigh, or hip brought on by ambulation: X   Pain in feet at night that wakes you up from your sleep:     Blood clot in your veins:    Leg swelling:         Pulmonary    Oxygen at home:    Productive cough:     Wheezing:         Neurologic    Sudden weakness in arms or legs:     Sudden numbness in arms or legs:     Sudden onset of difficulty speaking or slurred speech:    Temporary loss of vision in one eye:     Problems with dizziness:         Gastrointestinal    Blood in stool:     Vomited blood:         Genitourinary    Burning when urinating:     Blood in urine:        Psychiatric    Major  depression:         Hematologic    Bleeding problems:    Problems with blood  clotting too easily:        Skin    Rashes or ulcers:        Constitutional    Fever or chills:     PHYSICAL EXAM:   Vitals:   06/03/17 1555  BP: (!) 120/91  Pulse: 98  Resp: 20  Temp: 98.1 F (36.7 C)  TempSrc: Oral  SpO2: 97%  Weight: 191 lb (86.6 kg)  Height: 5\' 8"  (1.727 m)    GENERAL: The patient is a well-nourished male, in no acute distress. The vital signs are documented above. CARDIAC: There is a regular rate and rhythm.  VASCULAR: He has bilateral carotid bruits. He has palpable femoral, popliteal, and posterior tibial pulses bilaterally.  I cannot palpate dorsalis pedis pulses. He has no significant lower extremity swelling. PULMONARY: There is good air exchange bilaterally without wheezing or rales. ABDOMEN: Soft and non-tender with normal pitched bowel sounds.  I do not palpate an abdominal aortic aneurysm. MUSCULOSKELETAL: There are no major deformities or cyanosis. NEUROLOGIC: No focal weakness or paresthesias are detected. SKIN: There are no ulcers or rashes noted. PSYCHIATRIC: The patient has a normal affect.  DATA:    ARTERIAL DOPPLER STUDY: I have independently interpreted his arterial Doppler study.  On the right side there is a triphasic posterior tibial and dorsalis pedis signal.  ABIs 100%.  Toe pressure on the right is 128 mmHg.  On the left side there is a triphasic dorsalis pedis and posterior tibial signal.  ABI is 100%.  Toe pressure on the left is 86 mmHg.  CAROTID DUPLEX: I reviewed his carotid duplex scan that was done on 07/30/2015.  This showed less than 50% stenosis bilaterally.   MEDICAL ISSUES:   AORTOILIAC OCCLUSIVE DISEASE: This patient's symptoms are classic for claudication secondary to aortoiliac occlusive disease.  Although he has a normal Doppler study at rest, I suspect that he has disease at the aortic bifurcation which would be unmasked with  exercise.  His symptoms are quite disabling and he is unable to work currently because of the symptoms.  For this reason I think it would be reasonable to proceed with arteriography.  If we found disease at the aortic bifurcation that was amenable to angioplasty and stenting this can be addressed at the same time.  I have reviewed with the patient the indications for arteriography. In addition, I have reviewed the potential complications of arteriography including but not limited to: Bleeding, arterial injury, arterial thrombosis, dye action, renal insufficiency, or other unpredictable medical problems. I have explained to the patient that if we find disease amenable to angioplasty we could potentially address this at the same time. I have discussed the potential complications of angioplasty and stenting, including but not limited to: Bleeding, arterial thrombosis, arterial injury, dissection, or the need for surgical intervention.  We have discussed the importance of tobacco cessation.  We have also discussed the option of not proceeding with arteriography if his symptoms were tolerable however he felt that his symptoms were significantly disabling.  I will make further recommendations pending the results of his arteriogram which is scheduled for 06/15/2017.    [name] [facility] [phone]

## 2017-06-03 NOTE — H&P (View-Only) (Signed)
Patient name: Duane Adams MRN: [id number] DOB: 1956-04-16 Sex: male   REASON FOR CONSULT:    Peripheral vascular disease with claudication.  The consult is requested by Dr. Meda Coffee.  HPI:   Duane Adams is a pleasant 61 y.o. male, who was referred with bilateral lower extremity claudication.  He has been having symptoms for over a year.  Over the last several months the symptoms have gradually progressed.  He describes pain in his calves, thighs, and hips bilaterally which is brought on by ambulation and relieved with rest.  His symptoms are equal on both sides.  He can walk about 30-40 feet before experiencing symptoms.  He denies any history of rest pain or nonhealing ulcers.  His risk factors for peripheral vascular disease include hypertension, hypercholesterolemia, a family history of premature cardiovascular disease, and tobacco use.  He denies any history of diabetes.  He does have a history of coronary artery disease and is followed by Dr. Harl Bowie.  He has had previous PTCA and is on Plavix.  He is also had previous TIAs and his carotid disease is followed by Dr. Harl Bowie.   I have reviewed the records that were sent from the referring office.  The patient was seen on 05/05/2017 for continued follow-up of hypertension and peripheral vascular disease.  The patient has a history of alcoholism with hepatitis C and does have early cirrhosis of the liver.  Blood pressure and hyperlipidemia have been under good control.  Past Medical History:  Diagnosis Date  . Allergy   . Anxiety   . Cirrhosis ([place])   . Coronary artery disease    a. 03/2013 Cath/PCI: LM nl, LAD 4m (2.5x20 Promus Premier DES), LCX min irregs, RCA dom, 90p(3.0x20 Promus Premier DES), EF 55-65%.  . Essential hypertension, benign   . GERD (gastroesophageal reflux disease)   . Helicobacter pylori gastritis JUN 2016 EGD/Bx   PREVPAK BID FOR 14 DAYS  . Hepatitis    Hepatitis C treated   . Hiatal hernia   . Mixed  hyperlipidemia   . Skin cancer   . Substance abuse ([place])    alcoholic quit 6503  . Tobacco abuse     Family History  Problem Relation Age of Onset  . Stroke Mother   . Aneurysm Mother   . Hypertension Mother   . Heart disease Father   . Hypertension Father   . Heart disease Brother   . Hypertension Brother   . Alcohol abuse Brother   . Diabetes Paternal Aunt   . Colon cancer Neg Hx     SOCIAL HISTORY: Social History   Socioeconomic History  . Marital status: Soil scientist    Spouse name: Duane Adams  . Number of children: 2  . Years of education: 71  . Highest education level: Not on file  Social Needs  . Financial resource strain: Not on file  . Food insecurity - worry: Not on file  . Food insecurity - inability: Not on file  . Transportation needs - medical: Not on file  . Transportation needs - non-medical: Not on file  Occupational History  . Occupation: disabled    Comment: heart  Tobacco Use  . Smoking status: Current Every Day Smoker    Packs/day: 0.25    Years: 40.00    Pack years: 10.00    Types: Cigarettes    Start date: 07/14/1968  . Smokeless tobacco: Never Used  . Tobacco comment: 5-6 Cigarettes per day  Substance and Sexual  Activity  . Alcohol use: No    Alcohol/week: 0.0 oz  . Drug use: No  . Sexual activity: Not Currently  Other Topics Concern  . Not on file  Social History Narrative   Disabled from heart disease   Previously worked in Scientist, research (medical) with Duane Adams for 28 years   Leisure: computer    Allergies  Allergen Reactions  . Lisinopril Swelling    Swelling neck  . Neosporin [Neomycin-Bacitracin Zn-Polymyx] Rash    blistered    Current Outpatient Medications  Medication Sig Dispense Refill  . aspirin EC 81 MG tablet Take 81 mg by mouth daily.    Marland Kitchen atorvastatin (LIPITOR) 10 MG tablet Take 1 tablet (10 mg total) daily by mouth. 90 tablet 3  . chlorthalidone (HYGROTON) 25 MG tablet Take 1 tablet (25 mg total) by mouth daily. Take  12.5 mg daily.(1/2 tablet) (Patient taking differently: Take 25 mg by mouth daily. ) 90 tablet 3  . cloNIDine (CATAPRES) 0.1 MG tablet Take 1 tablet (0.1 mg total) 2 (two) times daily by mouth. 180 tablet 1  . clopidogrel (PLAVIX) 75 MG tablet Take 1 tablet (75 mg total) by mouth daily with breakfast. 90 tablet 3  . isosorbide mononitrate (IMDUR) 30 MG 24 hr tablet Take 0.5 tablets (15 mg total) by mouth daily. 45 tablet 3  . metoprolol succinate (TOPROL-XL) 50 MG 24 hr tablet Take 50 mg daily. ( 1 tablet) 90 tablet 3  . Multiple Vitamin (MULTIVITAMIN) tablet Take 1 tablet by mouth daily.    . nitroGLYCERIN (NITROSTAT) 0.4 MG SL tablet Place 1 tablet (0.4 mg total) under the tongue every 5 (five) minutes as needed for chest pain. 25 tablet 3  . pantoprazole (PROTONIX) 40 MG tablet TAKE ONE TABLET BY MOUTH ONCE DAILY. 90 tablet 3  . potassium chloride SA (K-DUR,KLOR-CON) 20 MEQ tablet Take 1 tablet (20 mEq total) by mouth daily. 90 tablet 3   No current facility-administered medications for this visit.     REVIEW OF SYSTEMS:  [X]  denotes positive finding, [ ]  denotes negative finding Cardiac  Comments:  Chest pain or chest pressure: X  stable  Shortness of breath upon exertion: X   Short of breath when lying flat:    Irregular heart rhythm:        Vascular    Pain in calf, thigh, or hip brought on by ambulation: X   Pain in feet at night that wakes you up from your sleep:     Blood clot in your veins:    Leg swelling:         Pulmonary    Oxygen at home:    Productive cough:     Wheezing:         Neurologic    Sudden weakness in arms or legs:     Sudden numbness in arms or legs:     Sudden onset of difficulty speaking or slurred speech:    Temporary loss of vision in one eye:     Problems with dizziness:         Gastrointestinal    Blood in stool:     Vomited blood:         Genitourinary    Burning when urinating:     Blood in urine:        Psychiatric    Major  depression:         Hematologic    Bleeding problems:    Problems with blood  clotting too easily:        Skin    Rashes or ulcers:        Constitutional    Fever or chills:     PHYSICAL EXAM:   Vitals:   06/03/17 1555  BP: (!) 120/91  Pulse: 98  Resp: 20  Temp: 98.1 F (36.7 C)  TempSrc: Oral  SpO2: 97%  Weight: 191 lb (86.6 kg)  Height: 5\' 8"  (1.727 m)    GENERAL: The patient is a well-nourished male, in no acute distress. The vital signs are documented above. CARDIAC: There is a regular rate and rhythm.  VASCULAR: He has bilateral carotid bruits. He has palpable femoral, popliteal, and posterior tibial pulses bilaterally.  I cannot palpate dorsalis pedis pulses. He has no significant lower extremity swelling. PULMONARY: There is good air exchange bilaterally without wheezing or rales. ABDOMEN: Soft and non-tender with normal pitched bowel sounds.  I do not palpate an abdominal aortic aneurysm. MUSCULOSKELETAL: There are no major deformities or cyanosis. NEUROLOGIC: No focal weakness or paresthesias are detected. SKIN: There are no ulcers or rashes noted. PSYCHIATRIC: The patient has a normal affect.  DATA:    ARTERIAL DOPPLER STUDY: I have independently interpreted his arterial Doppler study.  On the right side there is a triphasic posterior tibial and dorsalis pedis signal.  ABIs 100%.  Toe pressure on the right is 128 mmHg.  On the left side there is a triphasic dorsalis pedis and posterior tibial signal.  ABI is 100%.  Toe pressure on the left is 86 mmHg.  CAROTID DUPLEX: I reviewed his carotid duplex scan that was done on 07/30/2015.  This showed less than 50% stenosis bilaterally.   MEDICAL ISSUES:   AORTOILIAC OCCLUSIVE DISEASE: This patient's symptoms are classic for claudication secondary to aortoiliac occlusive disease.  Although he has a normal Doppler study at rest, I suspect that he has disease at the aortic bifurcation which would be unmasked with  exercise.  His symptoms are quite disabling and he is unable to work currently because of the symptoms.  For this reason I think it would be reasonable to proceed with arteriography.  If we found disease at the aortic bifurcation that was amenable to angioplasty and stenting this can be addressed at the same time.  I have reviewed with the patient the indications for arteriography. In addition, I have reviewed the potential complications of arteriography including but not limited to: Bleeding, arterial injury, arterial thrombosis, dye action, renal insufficiency, or other unpredictable medical problems. I have explained to the patient that if we find disease amenable to angioplasty we could potentially address this at the same time. I have discussed the potential complications of angioplasty and stenting, including but not limited to: Bleeding, arterial thrombosis, arterial injury, dissection, or the need for surgical intervention.  We have discussed the importance of tobacco cessation.  We have also discussed the option of not proceeding with arteriography if his symptoms were tolerable however he felt that his symptoms were significantly disabling.  I will make further recommendations pending the results of his arteriogram which is scheduled for 06/15/2017.    [name] [facility] [phone]

## 2017-06-08 ENCOUNTER — Ambulatory Visit (INDEPENDENT_AMBULATORY_CARE_PROVIDER_SITE_OTHER): Payer: Medicaid Other | Admitting: Family Medicine

## 2017-06-08 ENCOUNTER — Encounter: Payer: Self-pay | Admitting: Family Medicine

## 2017-06-08 ENCOUNTER — Other Ambulatory Visit: Payer: Self-pay

## 2017-06-08 VITALS — BP 110/74 | HR 84 | Temp 97.4°F | Resp 16 | Ht 68.0 in | Wt 190.1 lb

## 2017-06-08 DIAGNOSIS — I10 Essential (primary) hypertension: Secondary | ICD-10-CM

## 2017-06-08 DIAGNOSIS — I739 Peripheral vascular disease, unspecified: Secondary | ICD-10-CM | POA: Diagnosis not present

## 2017-06-08 DIAGNOSIS — R739 Hyperglycemia, unspecified: Secondary | ICD-10-CM | POA: Diagnosis not present

## 2017-06-08 DIAGNOSIS — E785 Hyperlipidemia, unspecified: Secondary | ICD-10-CM

## 2017-06-08 DIAGNOSIS — F17210 Nicotine dependence, cigarettes, uncomplicated: Secondary | ICD-10-CM | POA: Diagnosis not present

## 2017-06-08 DIAGNOSIS — Z23 Encounter for immunization: Secondary | ICD-10-CM

## 2017-06-08 DIAGNOSIS — Z0000 Encounter for general adult medical examination without abnormal findings: Secondary | ICD-10-CM | POA: Diagnosis not present

## 2017-06-08 MED ORDER — ATORVASTATIN CALCIUM 40 MG PO TABS: 40.0000 mg | ORAL_TABLET | Freq: Every day | ORAL | 3 refills | Status: DC

## 2017-06-08 MED ORDER — VARENICLINE TARTRATE 0.5 MG X 11 & 1 MG X 42 PO MISC: ORAL | 0 refills | Status: DC

## 2017-06-08 MED ORDER — VARENICLINE TARTRATE 1 MG PO TABS: 1.0000 mg | ORAL_TABLET | Freq: Two times a day (BID) | ORAL | 1 refills | Status: DC

## 2017-06-08 NOTE — Patient Instructions (Addendum)
STOP SMOKING  Take the chantix as instructed Need labs today I will send you a letter with your test results.  If there is anything of concern, we will call right away. See me in 1-2 months to check on the  NOT smoking success  I am increasing the lipitor because of your vascular disease - to protect against the hardening of arteries/blockages

## 2017-06-08 NOTE — Progress Notes (Signed)
Chief Complaint  Patient presents with  . Annual Exam   Here for a physical examination. He saw vascular surgery for his claudication is scheduled for an aortogram next week.  He continues to have significant claudication in both calves and thighs. He continues to smoke cigarettes.  We had a long discussion about his nicotine dependence.  He agrees to try to quit smoking today.  He accepts a prescription for Chantix.  We discussed how Chantix works, and the side effects to expect.  Warning signs.  The need to quit smoking completely while on Chantix.  Current plan is for him to stay on the Chantix for 3 months. He is on low-dose Lipitor for vascular disease.  His last LDL was 106.  I explained to him that he needs to be on Lipitor 40 a day to try to get his LDL lower to prevent additional atherosclerosis and coronary artery disease. He is up-to-date with his health maintenance. He is up-to-date with his immunizations. I am going to get hemoglobin A1c and BMP  today because of his proposed procedure next week. Patient Active Problem List   Diagnosis Date Noted  . PAD (peripheral artery disease) ([place]) 05/05/2017  . Intermittent claudication ([place]) 05/05/2017  . Hepatitis C virus infection resolved after antiviral drug therapy 05/05/2017  . Cirrhosis of liver ([place]) 05/05/2017  . Esophageal reflux 07/23/2015  . Nicotine dependence, cigarettes, with other nicotine-induced disorders 07/23/2015  . Hyperlipidemia 06/13/2015  . Colon polyps 01/14/2015  . Essential hypertension 04/12/2013  . Coronary artery disease   . 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 aspirin EC 81 MG tablet Take 81 mg by mouth daily.  Marland Kitchen atorvastatin (LIPITOR) 40 MG tablet Take 1 tablet (40 mg total) by mouth daily.  . chlorthalidone (HYGROTON) 25 MG tablet Take 1 tablet (25 mg total) by mouth daily. Take  12.5 mg daily.(1/2 tablet) (Patient taking differently: Take 25 mg by mouth daily. )  . cloNIDine (CATAPRES) 0.1 MG tablet Take 1 tablet (0.1 mg total) 2 (two) times daily by mouth.  . clopidogrel (PLAVIX) 75 MG tablet Take 1 tablet (75 mg total) by mouth daily with breakfast.  . isosorbide mononitrate (IMDUR) 30 MG 24 hr tablet Take 0.5 tablets (15 mg total) by mouth daily.  . metoprolol succinate (TOPROL-XL) 50 MG 24 hr tablet Take 50 mg daily. ( 1 tablet)  . Multiple Vitamin (MULTIVITAMIN) tablet Take 1 tablet by mouth daily.  . nitroGLYCERIN (NITROSTAT) 0.4 MG SL tablet Place 1 tablet (0.4 mg total) under the tongue every 5 (five) minutes as needed for chest pain.  . pantoprazole (PROTONIX) 40 MG tablet TAKE ONE TABLET BY MOUTH ONCE DAILY.  Marland Kitchen potassium chloride SA (K-DUR,KLOR-CON) 20 MEQ tablet Take 1 tablet (20 mEq total) by mouth daily.  . [DISCONTINUED] atorvastatin (LIPITOR) 10 MG tablet Take 1 tablet (10 mg total) daily by mouth.  . varenicline (CHANTIX CONTINUING MONTH PAK) 1 MG tablet Take 1 tablet (1 mg total) by mouth 2 (two) times daily.  . varenicline (CHANTIX STARTING MONTH PAK) 0.5 MG X 11 & 1 MG X 42 tablet Take as instructed   No facility-administered encounter medications on file as of 06/08/2017.     Allergies  Allergen Reactions  . Lisinopril Swelling    Swelling neck  . Neosporin [Neomycin-Bacitracin Zn-Polymyx] Rash    blistered    Review of Systems  Constitutional: Negative for activity change, appetite change and fatigue.  HENT: Positive for dental problem and hearing loss. Negative for rhinorrhea and sinus pain.        Needs dental care.  Gradual hearing loss.  Eyes: Positive for visual disturbance. Negative for photophobia.  Respiratory: Negative for cough, choking and shortness of breath.        Denies COPD symptoms.  Cardiovascular: Negative for chest pain, palpitations and leg swelling.  Gastrointestinal: Negative for blood in stool, constipation and  diarrhea.  Genitourinary: Negative for difficulty urinating and frequency.  Musculoskeletal: Positive for gait problem.       Claudication  Skin: Negative for color change and pallor.  Neurological: Negative for dizziness and headaches.  Hematological: Negative for adenopathy. Bruises/bleeds easily.  Psychiatric/Behavioral: Negative for dysphoric mood. The patient is not nervous/anxious.     BP 110/74 (BP Location: Left Arm, Patient Position: Sitting, Cuff Size: Normal)   Pulse 84   Temp (!) 97.4 F (36.3 C) (Temporal)   Resp 16   Ht 5\' 8"  (1.727 m)   Wt 190 lb 1.3 oz (86.2 kg)   SpO2 98%   BMI 28.90 kg/m   Physical Exam   BP 110/74 (BP Location: Left Arm, Patient Position: Sitting, Cuff Size: Normal)   Pulse 84   Temp (!) 97.4 F (36.3 C) (Temporal)   Resp 16   Ht 5\' 8"  (1.727 m)   Wt 190 lb 1.3 oz (86.2 kg)   SpO2 98%   BMI 28.90 kg/m   General Appearance:    Alert, cooperative, no distress, appears stated age.  Smells of tobacco  Head:    Normocephalic, without obvious abnormality, atraumatic  Eyes:    PERRL, conjunctiva/corneas clear, EOM's intact, fundi    benign, both eyes       Ears:    Normal TM's and external ear canals, both ears  Nose:   Nares normal, septum midline, mucosa normal, no drainage   or sinus tenderness  Throat:   Lips, mucosa, and tongue normal; teeth are in poor repair with many absent, periodontal disease and gums normal  Neck:   Supple, symmetrical, trachea midline, no adenopathy;       thyroid:  No enlargement/tenderness/nodules; no carotid   bruit  Back:     Symmetric, no curvature, ROM normal, no CVA tenderness  Lungs:     Clear to auscultation bilaterally, respirations unlabored  Chest wall:    No tenderness or deformity  Heart:    Regular rate and rhythm, S1 and S2 normal, no murmur, rub   or gallop  Abdomen:     Soft, non-tender, bowel sounds active all four quadrants,    no masses, no organomegaly.  Telangiectasias across upper  abdomen consistent with cirrhosis.  Mild tenderness right upper quadrant but no palpable hepatomegaly  Extremities:   Extremities normal, atraumatic, no cyanosis or edema  Pulses:  Dorsalis pedis pulses absent symmetric lower extremities  Skin:   Skin color, texture, turgor normal, no rashes or lesions  Lymph nodes:   Cervical, supraclavicular, and axillary nodes normal  Neurologic:   CNII-XII intact. Normal strength, sensation and reflexes      throughout     ASSESSMENT/PLAN:  1. PE (physical exam), annual Physical exam findings consistent with peripheral vascular disease, and cirrhosis.  No unexpected findings  2. PAD (peripheral artery disease) ([facility]) Is seen vascular surgery  3. Blood glucose elevated Needs additional evaluation - Hemoglobin A1c  4. Essential hypertension Well-controlled - Basic Metabolic  Panel (BMET)  5. Hyperlipidemia, unspecified hyperlipidemia type Not well controlled.  Last LDL 106.  I am increasing Lipitor  6.  Nicotine dependence.  We long discussion about his nicotine dependence, methods of quitting, harm he was doing to his health.  He did agree to Chantix.  We discussed Chantix use, expectations, side effects.  He will return to see me in 1 month for follow-up on his nicotine dependence  Recommend patient make an appoint with an eye doctor.  Recommend patient make an appointment with his dentist.  Recommend he call his insurance to find providers that will cover services.  Patient does not feel like he yet needs a hearing aid.  Will consider ENT consult in the future  Patient Instructions  STOP SMOKING  Take the chantix as instructed Need labs today I will send you a letter with your test results.  If there is anything of concern, we will call right away. See me in 1-2 months to check on the  NOT smoking success  I am increasing the lipitor because of your vascular disease - to protect against the hardening of arteries/blockages   Raylene Everts, MD

## 2017-06-09 LAB — BASIC METABOLIC PANEL
BUN: 16 mg/dL (ref 7–25)
CALCIUM: 9.5 mg/dL (ref 8.6–10.3)
CHLORIDE: 102 mmol/L (ref 98–110)
CO2: 30 mmol/L (ref 20–32)
CREATININE: 0.85 mg/dL (ref 0.70–1.25)
Glucose, Bld: 98 mg/dL (ref 65–139)
Potassium: 3.9 mmol/L (ref 3.5–5.3)
Sodium: 138 mmol/L (ref 135–146)

## 2017-06-09 LAB — HEMOGLOBIN A1C
Hgb A1c MFr Bld: 5.8 % of total Hgb — ABNORMAL HIGH (ref <5.7)
MEAN PLASMA GLUCOSE: 120 (calc)
eAG (mmol/L): 6.6 (calc)

## 2017-06-15 ENCOUNTER — Encounter (HOSPITAL_COMMUNITY)
Admission: RE | Disposition: A | Discharge status: Home / Self Care | Payer: Self-pay | Source: Ambulatory Visit | Attending: Vascular Surgery

## 2017-06-15 ENCOUNTER — Telehealth: Payer: Self-pay | Admitting: Vascular Surgery

## 2017-06-15 ENCOUNTER — Ambulatory Visit (HOSPITAL_COMMUNITY)
Admission: RE | Admit: 2017-06-15 | Discharge: 2017-06-15 | Disposition: A | Discharge status: Home / Self Care | Payer: Medicaid Other | Source: Ambulatory Visit | Attending: Vascular Surgery | Admitting: Vascular Surgery

## 2017-06-15 DIAGNOSIS — K219 Gastro-esophageal reflux disease without esophagitis: Secondary | ICD-10-CM | POA: Insufficient documentation

## 2017-06-15 DIAGNOSIS — K7460 Unspecified cirrhosis of liver: Secondary | ICD-10-CM | POA: Diagnosis not present

## 2017-06-15 DIAGNOSIS — Z7982 Long term (current) use of aspirin: Secondary | ICD-10-CM | POA: Insufficient documentation

## 2017-06-15 DIAGNOSIS — Z8249 Family history of ischemic heart disease and other diseases of the circulatory system: Secondary | ICD-10-CM | POA: Insufficient documentation

## 2017-06-15 DIAGNOSIS — I739 Peripheral vascular disease, unspecified: Secondary | ICD-10-CM | POA: Diagnosis present

## 2017-06-15 DIAGNOSIS — Z8673 Personal history of transient ischemic attack (TIA), and cerebral infarction without residual deficits: Secondary | ICD-10-CM | POA: Diagnosis not present

## 2017-06-15 DIAGNOSIS — F17210 Nicotine dependence, cigarettes, uncomplicated: Secondary | ICD-10-CM | POA: Insufficient documentation

## 2017-06-15 DIAGNOSIS — E782 Mixed hyperlipidemia: Secondary | ICD-10-CM | POA: Insufficient documentation

## 2017-06-15 DIAGNOSIS — M79661 Pain in right lower leg: Secondary | ICD-10-CM | POA: Diagnosis not present

## 2017-06-15 DIAGNOSIS — I70212 Atherosclerosis of native arteries of extremities with intermittent claudication, left leg: Secondary | ICD-10-CM

## 2017-06-15 DIAGNOSIS — I2510 Atherosclerotic heart disease of native coronary artery without angina pectoris: Secondary | ICD-10-CM | POA: Insufficient documentation

## 2017-06-15 DIAGNOSIS — Z7902 Long term (current) use of antithrombotics/antiplatelets: Secondary | ICD-10-CM | POA: Insufficient documentation

## 2017-06-15 DIAGNOSIS — F419 Anxiety disorder, unspecified: Secondary | ICD-10-CM | POA: Insufficient documentation

## 2017-06-15 DIAGNOSIS — B1920 Unspecified viral hepatitis C without hepatic coma: Secondary | ICD-10-CM | POA: Diagnosis not present

## 2017-06-15 DIAGNOSIS — M79662 Pain in left lower leg: Secondary | ICD-10-CM | POA: Insufficient documentation

## 2017-06-15 DIAGNOSIS — I10 Essential (primary) hypertension: Secondary | ICD-10-CM | POA: Insufficient documentation

## 2017-06-15 HISTORY — PX: ABDOMINAL AORTOGRAM W/LOWER EXTREMITY: CATH118223

## 2017-06-15 LAB — POCT I-STAT, CHEM 8
BUN: 14 mg/dL (ref 6–20)
CALCIUM ION: 1.11 mmol/L — AB (ref 1.15–1.40)
CREATININE: 0.7 mg/dL (ref 0.61–1.24)
Chloride: 98 mmol/L — ABNORMAL LOW (ref 101–111)
Glucose, Bld: 110 mg/dL — ABNORMAL HIGH (ref 65–99)
HEMATOCRIT: 46 % (ref 39.0–52.0)
HEMOGLOBIN: 15.6 g/dL (ref 13.0–17.0)
Potassium: 3.7 mmol/L (ref 3.5–5.1)
SODIUM: 139 mmol/L (ref 135–145)
TCO2: 29 mmol/L (ref 22–32)

## 2017-06-15 SURGERY — ABDOMINAL AORTOGRAM W/LOWER EXTREMITY
Anesthesia: LOCAL

## 2017-06-15 MED ORDER — LABETALOL HCL 5 MG/ML IV SOLN: 10.0000 mg | INTRAVENOUS | Status: DC | PRN

## 2017-06-15 MED ORDER — SODIUM CHLORIDE 0.9% FLUSH: 3.0000 mL | Freq: Two times a day (BID) | INTRAVENOUS | Status: DC

## 2017-06-15 MED ORDER — SODIUM CHLORIDE 0.9% FLUSH: 3.0000 mL | INTRAVENOUS | Status: DC | PRN

## 2017-06-15 MED ORDER — FENTANYL CITRATE (PF) 100 MCG/2ML IJ SOLN
INTRAMUSCULAR | Status: AC
  Filled 2017-06-15: qty 2

## 2017-06-15 MED ORDER — LIDOCAINE HCL (PF) 1 % IJ SOLN
INTRAMUSCULAR | Status: AC
  Filled 2017-06-15: qty 30

## 2017-06-15 MED ORDER — LIDOCAINE HCL (PF) 1 % IJ SOLN
INTRAMUSCULAR | Status: DC | PRN
  Administered 2017-06-15: 15 mL

## 2017-06-15 MED ORDER — SODIUM CHLORIDE 0.9 % WEIGHT BASED INFUSION: 1.0000 mL/kg/h | INTRAVENOUS | Status: DC

## 2017-06-15 MED ORDER — HYDRALAZINE HCL 20 MG/ML IJ SOLN: 5.0000 mg | INTRAMUSCULAR | Status: DC | PRN

## 2017-06-15 MED ORDER — HEPARIN (PORCINE) IN NACL 2-0.9 UNIT/ML-% IJ SOLN
INTRAMUSCULAR | Status: AC
  Filled 2017-06-15: qty 1000

## 2017-06-15 MED ORDER — FENTANYL CITRATE (PF) 100 MCG/2ML IJ SOLN
INTRAMUSCULAR | Status: DC | PRN
  Administered 2017-06-15: 50 ug via INTRAVENOUS

## 2017-06-15 MED ORDER — IODIXANOL 320 MG/ML IV SOLN
INTRAVENOUS | Status: DC | PRN
  Administered 2017-06-15: 175 mL via INTRA_ARTERIAL

## 2017-06-15 MED ORDER — HEPARIN (PORCINE) IN NACL 2-0.9 UNIT/ML-% IJ SOLN
INTRAMUSCULAR | Status: AC | PRN
  Administered 2017-06-15: 1000 mL

## 2017-06-15 MED ORDER — SODIUM CHLORIDE 0.9 % IV SOLN
INTRAVENOUS | Status: DC
  Administered 2017-06-15: 08:00:00 via INTRAVENOUS

## 2017-06-15 MED ORDER — SODIUM CHLORIDE 0.9 % IV SOLN: 250.0000 mL | INTRAVENOUS | Status: DC | PRN

## 2017-06-15 MED ORDER — MIDAZOLAM HCL 2 MG/2ML IJ SOLN
INTRAMUSCULAR | Status: AC
  Filled 2017-06-15: qty 2

## 2017-06-15 MED ORDER — MIDAZOLAM HCL 2 MG/2ML IJ SOLN
INTRAMUSCULAR | Status: DC | PRN
Start: 2017-06-15 — End: 2017-06-15
  Administered 2017-06-15: 1 mg via INTRAVENOUS

## 2017-06-15 SURGICAL SUPPLY — 10 items
CATH ANGIO 5F PIGTAIL 65CM (CATHETERS) ×1 IMPLANT
COVER PRB 48X5XTLSCP FOLD TPE (BAG) IMPLANT
COVER PROBE 5X48 (BAG) ×2
KIT PV (KITS) ×2 IMPLANT
SET INTRODUCER MICROPUNCT 5F (INTRODUCER) ×1 IMPLANT
SHEATH PINNACLE 5F 10CM (SHEATH) ×1 IMPLANT
SYR MEDRAD MARK V 150ML (SYRINGE) ×2 IMPLANT
TRANSDUCER W/STOPCOCK (MISCELLANEOUS) ×2 IMPLANT
TRAY PV CATH (CUSTOM PROCEDURE TRAY) ×2 IMPLANT
WIRE HITORQ VERSACORE ST 145CM (WIRE) ×1 IMPLANT

## 2017-06-15 NOTE — Progress Notes (Signed)
Site area: Right groin a 5 french arterial sheath was removed  Site Prior to Removal:  Level 0  Pressure Applied For 20 MINUTES    Bedrest Beginning at 1000am  Manual:   Yes.    Patient Status During Pull:  stable  Post Pull Groin Site:  Level 0  Post Pull Instructions Given:  Yes.    Post Pull Pulses Present:  Yes.    Dressing Applied:  Yes.    Comments:  stable

## 2017-06-15 NOTE — Telephone Encounter (Signed)
-----   Message from Mena Goes, RN sent at 06/15/2017 10:20 AM EST ----- Regarding: 1 year    ----- Message ----- From: Angelia Mould, MD Sent: 06/15/2017   9:36 AM To: [facility] Charge Pool Subject: charge and f/u                                   PATIENT: Duane Adams      MRN: [id number] DOB: January 13, 1956    DATE OF PROCEDURE: 06/15/2017  INDICATIONS:   Duane Adams is a 61 y.o. male who presented with bilateral lower extremities hip thigh and calf claudication.  Although he had essentially normal noninvasive studies, his history was classic for aortoiliac occlusive disease and he had no other obvious etiology for his symptoms.  He has no history of disc disease in his back.  He presents for arteriography.  PROCEDURE:   1.  Conscious sedation 2.  Ultrasound-guided access to the right common femoral artery 3.  Aortogram with bilateral iliac arteriogram and bilateral lower extremity runoff  SURGEON: Judeth Cornfield. Scot Dock, MD, FACS   He will need a follow-up visit in 1 year with ABIs aortoiliac duplex.  Thank you. CD

## 2017-06-15 NOTE — Interval H&P Note (Signed)
History and Physical Interval Note:  06/15/2017 8:30 AM  Duane Adams  has presented today for surgery, with the diagnosis of pad  The various methods of treatment have been discussed with the patient and family. After consideration of risks, benefits and other options for treatment, the patient has consented to  Procedure(s): ABDOMINAL AORTOGRAM W/LOWER EXTREMITY (N/A) as a surgical intervention .  The patient's history has been reviewed, patient examined, no change in status, stable for surgery.  I have reviewed the patient's chart and labs.  Questions were answered to the patient's satisfaction.     [name]

## 2017-06-15 NOTE — Op Note (Signed)
   PATIENT: Duane Adams      MRN: [id number] DOB: 1956/02/16    DATE OF PROCEDURE: 06/15/2017  INDICATIONS:    Duane Adams is a 61 y.o. male who presented with bilateral lower extremities hip thigh and calf claudication.  Although he had essentially normal noninvasive studies, his history was classic for aortoiliac occlusive disease and he had no other obvious etiology for his symptoms.  He has no history of disc disease in his back.  He presents for arteriography.  PROCEDURE:    1.  Conscious sedation 2.  Ultrasound-guided access to the right common femoral artery 3.  Aortogram with bilateral iliac arteriogram and bilateral lower extremity runoff  SURGEON: Judeth Cornfield. Scot Dock, MD, FACS  ANESTHESIA: Local with sedation  EBL: Minimal  TECHNIQUE: The patient was brought to the peripheral vascular lab and was sedated. The period of conscious sedation was 40 minutes.  During that time period, I was present face-to-face 100% of the time.  The patient was administered 1 mg of Versed and 50 mcg of fentanyl. The patient's heart rate, blood pressure, and oxygen saturation were monitored by the nurse continuously during the procedure.  Both groins were prepped and draped in the usual sterile fashion.  Under ultrasound guidance, after the skin was anesthetized, the right common femoral artery was cannulated and a micropuncture sheath introduced over the wire.  This was exchanged for a 5 French sheath over a versa core wire.  Pigtail catheter was positioned at the L1 vertebral body.  Flush aortogram obtained.  Catheter was then advanced in the lateral aortogram obtained.  Catheter was in position above the aortic bifurcation and oblique iliac projections were obtained.  Next bilateral lower extremity runoff films were obtained.  I then removed the pigtail catheter over a wire and shot a retrograde right femoral arteriogram with a spot film of the right leg.  Patient was transferred to the holding  area for removal of the sheath.  No immediate complications were noted.   FINDINGS:   1.  There are 2 renal arteries on the right and a single renal artery on the left.  There is no renal artery stenosis 2.  The infrarenal aorta is widely patent.  There is some irregularity in the midportion of the infrarenal aorta but this does not produce any flow limitation.  There is no evidence of aneurysmal disease.  3.  On the right side the common iliac, external iliac, internal iliac, common femoral, deep femoral, superficial femoral, popliteal, anterior tibial, posterior tibial, and peroneal arteries are patent.  The Anterior Tibial Artery Gets Small Distally. 4.  On the left side, the common iliac, external iliac, hypogastric, common femoral artery, deep femoral artery, superficial femoral artery, popliteal, anterior tibial, posterior tibial, and peroneal arteries are patent.  There is poor visualization of the peroneal and anterior tibial artery distally.  CLINICAL NOTE: He does not have any significant aortoiliac occlusive disease which would explain his symptoms.  His symptoms progress I recommend that he obtain neurologic evaluation.  I will arrange for a follow-up aortoiliac duplex in 1 year and see him back at that time.  He knows to call sooner if he has problems.   [name], MD, FACS [facility]  DATE OF DICTATION:   06/15/2017

## 2017-06-15 NOTE — Discharge Instructions (Signed)
Angiogram, Care After °This sheet gives you information about how to care for yourself after your procedure. Your health care provider may also give you more specific instructions. If you have problems or questions, contact your health care provider. °What can I expect after the procedure? °After the procedure, it is common to have bruising and tenderness at the catheter insertion area. °Follow these instructions at home: °Insertion site care  °· Follow instructions from your health care provider about how to take care of your insertion site. Make sure you: °¨ Wash your hands with soap and water before you change your bandage (dressing). If soap and water are not available, use hand sanitizer. °¨ Change your dressing as told by your health care provider. °¨ Leave stitches (sutures), skin glue, or adhesive strips in place. These skin closures may need to stay in place for 2 weeks or longer. If adhesive strip edges start to loosen and curl up, you may trim the loose edges. Do not remove adhesive strips completely unless your health care provider tells you to do that. °· Do not take baths, swim, or use a hot tub until your health care provider approves. °· You may shower 24-48 hours after the procedure or as told by your health care provider. °¨ Gently wash the site with plain soap and water. °¨ Pat the area dry with a clean towel. °¨ Do not rub the site. This may cause bleeding. °· Do not apply powder or lotion to the site. Keep the site clean and dry. °· Check your insertion site every day for signs of infection. Check for: °¨ Redness, swelling, or pain. °¨ Fluid or blood. °¨ Warmth. °¨ Pus or a bad smell. °Activity  °· Rest as told by your health care provider, usually for 1-2 days. °· Do not lift anything that is heavier than 10 lbs. (4.5 kg) or as told by your health care provider. °· Do not drive for 24 hours if you were given a medicine to help you relax (sedative). °· Do not drive or use heavy machinery while  taking prescription pain medicine. °General instructions  °· Return to your normal activities as told by your health care provider, usually in about a week. Ask your health care provider what activities are safe for you. °· If the catheter site starts bleeding, lie flat and put pressure on the site. If the bleeding does not stop, get help right away. This is a medical emergency. °· Drink enough fluid to keep your urine clear or pale yellow. This helps flush the contrast dye from your body. °· Take over-the-counter and prescription medicines only as told by your health care provider. °· Keep all follow-up visits as told by your health care provider. This is important. °Contact a health care provider if: °· You have a fever or chills. °· You have redness, swelling, or pain around your insertion site. °· You have fluid or blood coming from your insertion site. °· The insertion site feels warm to the touch. °· You have pus or a bad smell coming from your insertion site. °· You have bruising around the insertion site. °· You notice blood collecting in the tissue around the catheter site (hematoma). The hematoma may be painful to the touch. °Get help right away if: °· You have severe pain at the catheter insertion area. °· The catheter insertion area swells very fast. °· The catheter insertion area is bleeding, and the bleeding does not stop when you hold steady pressure on   the area. °· The area near or just beyond the catheter insertion site becomes pale, cool, tingly, or numb. °These symptoms may represent a serious problem that is an emergency. Do not wait to see if the symptoms will go away. Get medical help right away. Call your local emergency services (911 in the [place] Do not drive yourself to the hospital. °Summary °· After the procedure, it is common to have bruising and tenderness at the catheter insertion area. °· After the procedure, it is important to rest and drink plenty of fluids. °· Do not take baths,  swim, or use a hot tub until your health care provider says it is okay to do so. You may shower 24-48 hours after the procedure or as told by your health care provider. °· If the catheter site starts bleeding, lie flat and put pressure on the site. If the bleeding does not stop, get help right away. This is a medical emergency. °This information is not intended to replace advice given to you by your health care provider. Make sure you discuss any questions you have with your health care provider. °Document Released: 01/16/2005 Document Revised: 06/04/2016 Document Reviewed: 06/04/2016 °Elsevier Interactive Patient Education © 2017 [employer] ° °

## 2017-06-15 NOTE — Progress Notes (Signed)
Pt ambulated to bathroom tolerated well.  Right groin level zero.

## 2017-06-16 ENCOUNTER — Encounter (HOSPITAL_COMMUNITY): Payer: Self-pay | Admitting: Vascular Surgery

## 2017-07-20 ENCOUNTER — Ambulatory Visit (INDEPENDENT_AMBULATORY_CARE_PROVIDER_SITE_OTHER): Payer: Medicaid Other | Admitting: Family Medicine

## 2017-07-20 ENCOUNTER — Other Ambulatory Visit: Payer: Self-pay

## 2017-07-20 ENCOUNTER — Encounter: Payer: Self-pay | Admitting: Family Medicine

## 2017-07-20 VITALS — BP 134/80 | HR 76 | Temp 98.7°F | Resp 16 | Ht 68.0 in | Wt 193.0 lb

## 2017-07-20 DIAGNOSIS — I10 Essential (primary) hypertension: Secondary | ICD-10-CM | POA: Diagnosis not present

## 2017-07-20 DIAGNOSIS — G9519 Other vascular myelopathies: Secondary | ICD-10-CM

## 2017-07-20 DIAGNOSIS — I739 Peripheral vascular disease, unspecified: Secondary | ICD-10-CM

## 2017-07-20 DIAGNOSIS — E785 Hyperlipidemia, unspecified: Secondary | ICD-10-CM

## 2017-07-20 DIAGNOSIS — M48062 Spinal stenosis, lumbar region with neurogenic claudication: Secondary | ICD-10-CM

## 2017-07-20 DIAGNOSIS — F17210 Nicotine dependence, cigarettes, uncomplicated: Secondary | ICD-10-CM | POA: Diagnosis not present

## 2017-07-20 NOTE — Progress Notes (Signed)
Chief Complaint  Patient presents with  . Follow-up    2 month   Patient is here for routine follow-up.  On my last of evaluation, we discussed that he had bilateral leg pain with exercise or activity.  Given his history of coronary artery disease, hypertension, hyperlipidemia, and smoking, peripheral arterial disease seemed the most likely diagnosis.  He was sent for vascular surgery evaluation and they did an aortogram.  It was negative.  He does not have any blockage of the arterial supply to his lower extremities. I discussed with the patient today that there are more than one kinds of claudication.  It is possible he has neurogenic claudication.  Is a look at his old radiology history, he did have a CT of his neck in 2011.  At that time he had significant cervical degenerative disc disease with spinal stenosis.  He tells me he had a back injury and was "laid up" for 3 months with severe back pain and had injections in his back.  He does not recall if he was told he had a disc injury.  In any event, neurogenic workup is appropriate.  I am going to start with simple back and neck x-rays.   The patient stated he was ready to quit smoking at his last visit.  I gave him a prescription for Chantix.  He states he took it for 2 weeks.  He had to stop the Chantix because of abdominal pain. Because his LDL was 106 I doubled his Lipitor.  He is now taking 40 mg a day.  He is tolerating this well. He has hypertension.  He is compliant with medication.  His blood pressure today is controlled.  Patient Active Problem List   Diagnosis Date Noted  . PAD (peripheral artery disease) ([place]) 05/05/2017  . Intermittent claudication ([place]) 05/05/2017  . Hepatitis C virus infection resolved after antiviral drug therapy 05/05/2017  . Cirrhosis of liver ([place]) 05/05/2017  . Esophageal reflux 07/23/2015  . Nicotine dependence, cigarettes, with other nicotine-induced disorders 07/23/2015  . Hyperlipidemia 06/13/2015   . Colon polyps 01/14/2015  . Essential hypertension 04/12/2013  . Coronary artery disease   . Unspecified malignant neoplasm of skin of lower limb, including hip 03/16/2013    Outpatient Encounter Medications as of 07/20/2017  Medication Sig  . aspirin EC 81 MG tablet Take 81 mg by mouth at bedtime.   Marland Kitchen atorvastatin (LIPITOR) 40 MG tablet Take 1 tablet (40 mg total) by mouth daily.  . chlorthalidone (HYGROTON) 25 MG tablet Take 1 tablet (25 mg total) by mouth daily. Take 12.5 mg daily.(1/2 tablet) (Patient taking differently: Take 25 mg by mouth daily. )  . Cholecalciferol (VITAMIN D) 2000 units tablet Take 2,000 Units by mouth daily.  . cloNIDine (CATAPRES) 0.1 MG tablet Take 1 tablet (0.1 mg total) 2 (two) times daily by mouth.  . clopidogrel (PLAVIX) 75 MG tablet Take 1 tablet (75 mg total) by mouth daily with breakfast.  . isosorbide mononitrate (IMDUR) 30 MG 24 hr tablet Take 0.5 tablets (15 mg total) by mouth daily.  . metoprolol succinate (TOPROL-XL) 50 MG 24 hr tablet Take 50 mg daily. ( 1 tablet) (Patient taking differently: Take 50 mg by mouth daily. )  . Multiple Vitamin (MULTIVITAMIN WITH MINERALS) TABS tablet Take 1 tablet by mouth daily.  . nitroGLYCERIN (NITROSTAT) 0.4 MG SL tablet Place 1 tablet (0.4 mg total) under the tongue every 5 (five) minutes as needed for chest pain.  . pantoprazole (  PROTONIX) 40 MG tablet TAKE ONE TABLET BY MOUTH ONCE DAILY.  Marland Kitchen potassium chloride SA (K-DUR,KLOR-CON) 20 MEQ tablet Take 1 tablet (20 mEq total) by mouth daily.   No facility-administered encounter medications on file as of 07/20/2017.     Allergies  Allergen Reactions  . Lisinopril Swelling    Swelling neck  . Chantix [Varenicline] Other (See Comments)    Abdominal pain  . Neosporin [Neomycin-Bacitracin Zn-Polymyx] Rash    blistered    Review of Systems  Constitutional: Negative for activity change, appetite change and fatigue.  HENT: Positive for dental problem and hearing  loss. Negative for rhinorrhea and sinus pain.        Needs dental care.  Gradual hearing loss.  Eyes: Positive for visual disturbance. Negative for photophobia.       Overdue eye exam  Respiratory: Negative for cough, choking and shortness of breath.        Denies COPD symptoms.  Cardiovascular: Negative for chest pain, palpitations and leg swelling.  Gastrointestinal: Negative for blood in stool, constipation and diarrhea.  Genitourinary: Negative for difficulty urinating and frequency.  Musculoskeletal: Positive for gait problem.       Claudication  Skin: Negative for color change and pallor.  Neurological: Negative for dizziness and headaches.  Hematological: Negative for adenopathy. Bruises/bleeds easily.       On Plavix  Psychiatric/Behavioral: Negative for dysphoric mood. The patient is not nervous/anxious.     BP 134/80 (BP Location: Left Arm, Patient Position: Sitting, Cuff Size: Large)   Pulse 76   Temp 98.7 F (37.1 C) (Temporal)   Resp 16   Ht 5\' 8"  (1.727 m)   Wt 193 lb 0.6 oz (87.6 kg)   SpO2 96%   BMI 29.35 kg/m   Physical Exam  Constitutional: He is oriented to person, place, and time. He appears well-developed and well-nourished.  Smells of tobacco  HENT:  Head: Normocephalic and atraumatic.  Mouth/Throat: Oropharynx is clear and moist.  Eyes: Conjunctivae are normal. Pupils are equal, round, and reactive to light.  Neck: Normal range of motion. Neck supple. No thyromegaly present.  Cardiovascular: Normal rate, regular rhythm and normal heart sounds.  No pedal pusles  Pulmonary/Chest: Effort normal and breath sounds normal. No respiratory distress.  Musculoskeletal: Normal range of motion. He exhibits no edema.  Lymphadenopathy:    He has no cervical adenopathy.  Neurological: He is alert and oriented to person, place, and time. He displays normal reflexes. No sensory deficit. Coordination normal.  No clonus  Skin: Skin is warm and dry.  Psychiatric: He  has a normal mood and affect. His behavior is normal. Thought content normal.  Nursing note and vitals reviewed.   ASSESSMENT/PLAN:  1. Neurogenic claudication  - DG Lumbar Spine Complete; Future - DG Cervical Spine Complete; Future  2. PAD (peripheral artery disease) ([place])   3. Essential hypertension   4. Hyperlipidemia, unspecified hyperlipidemia type   5. Cigarette nicotine dependence without complication    Patient Instructions  Need x rays of neck and low back Continue to try to quit smoking  See me in 3 months I will work on testing and referral for your legs and let you know    Raylene Everts, MD

## 2017-07-20 NOTE — Patient Instructions (Signed)
Need x rays of neck and low back Continue to try to quit smoking  See me in 3 months I will work on testing and referral for your legs and let you know

## 2017-09-21 ENCOUNTER — Encounter: Payer: Self-pay | Admitting: Family Medicine

## 2017-09-23 ENCOUNTER — Other Ambulatory Visit: Payer: Self-pay | Admitting: Gastroenterology

## 2017-10-26 DIAGNOSIS — M79606 Pain in leg, unspecified: Secondary | ICD-10-CM | POA: Diagnosis not present

## 2017-10-26 DIAGNOSIS — R6882 Decreased libido: Secondary | ICD-10-CM | POA: Diagnosis not present

## 2017-10-26 DIAGNOSIS — I2510 Atherosclerotic heart disease of native coronary artery without angina pectoris: Secondary | ICD-10-CM | POA: Diagnosis not present

## 2017-10-26 DIAGNOSIS — I10 Essential (primary) hypertension: Secondary | ICD-10-CM | POA: Diagnosis not present

## 2017-11-06 ENCOUNTER — Other Ambulatory Visit: Payer: Self-pay | Admitting: Cardiology

## 2017-11-06 DIAGNOSIS — E782 Mixed hyperlipidemia: Secondary | ICD-10-CM

## 2017-11-24 ENCOUNTER — Other Ambulatory Visit: Payer: Self-pay | Admitting: Family Medicine

## 2017-11-25 ENCOUNTER — Encounter (HOSPITAL_COMMUNITY): Payer: Self-pay | Admitting: Emergency Medicine

## 2017-11-25 ENCOUNTER — Other Ambulatory Visit: Payer: Self-pay

## 2017-11-25 ENCOUNTER — Emergency Department (HOSPITAL_COMMUNITY)
Admission: EM | Admit: 2017-11-25 | Discharge: 2017-11-25 | Disposition: A | Discharge status: Home / Self Care | Payer: Medicaid Other | Attending: Emergency Medicine | Admitting: Emergency Medicine

## 2017-11-25 DIAGNOSIS — Z7902 Long term (current) use of antithrombotics/antiplatelets: Secondary | ICD-10-CM | POA: Insufficient documentation

## 2017-11-25 DIAGNOSIS — K625 Hemorrhage of anus and rectum: Secondary | ICD-10-CM | POA: Insufficient documentation

## 2017-11-25 DIAGNOSIS — Z79899 Other long term (current) drug therapy: Secondary | ICD-10-CM | POA: Diagnosis not present

## 2017-11-25 DIAGNOSIS — I10 Essential (primary) hypertension: Secondary | ICD-10-CM | POA: Diagnosis not present

## 2017-11-25 DIAGNOSIS — I259 Chronic ischemic heart disease, unspecified: Secondary | ICD-10-CM | POA: Insufficient documentation

## 2017-11-25 DIAGNOSIS — F17210 Nicotine dependence, cigarettes, uncomplicated: Secondary | ICD-10-CM | POA: Insufficient documentation

## 2017-11-25 DIAGNOSIS — K922 Gastrointestinal hemorrhage, unspecified: Secondary | ICD-10-CM | POA: Diagnosis present

## 2017-11-25 DIAGNOSIS — Z7982 Long term (current) use of aspirin: Secondary | ICD-10-CM | POA: Diagnosis not present

## 2017-11-25 LAB — CBC
HCT: 40.6 % (ref 39.0–52.0)
HEMOGLOBIN: 10.4 g/dL — AB (ref 13.0–17.0)
MCH: 23.4 pg — ABNORMAL LOW (ref 26.0–34.0)
MCHC: 25.6 g/dL — AB (ref 30.0–36.0)
MCV: 91.2 fL (ref 78.0–100.0)
Platelets: 308 10*3/uL (ref 150–400)
RBC: 4.45 MIL/uL (ref 4.22–5.81)
RDW: 13.7 % (ref 11.5–15.5)
WBC: 7.4 10*3/uL (ref 4.0–10.5)

## 2017-11-25 LAB — COMPREHENSIVE METABOLIC PANEL
ALT: 24 U/L (ref 17–63)
AST: 29 U/L (ref 15–41)
Albumin: 4.5 g/dL (ref 3.5–5.0)
Alkaline Phosphatase: 53 U/L (ref 38–126)
Anion gap: 11 (ref 5–15)
BUN: 15 mg/dL (ref 6–20)
CALCIUM: 9.6 mg/dL (ref 8.9–10.3)
CO2: 24 mmol/L (ref 22–32)
Chloride: 102 mmol/L (ref 101–111)
Creatinine, Ser: 0.95 mg/dL (ref 0.61–1.24)
Glucose, Bld: 128 mg/dL — ABNORMAL HIGH (ref 65–99)
POTASSIUM: 4 mmol/L (ref 3.5–5.1)
SODIUM: 137 mmol/L (ref 135–145)
TOTAL PROTEIN: 7.9 g/dL (ref 6.5–8.1)
Total Bilirubin: 1 mg/dL (ref 0.3–1.2)

## 2017-11-25 LAB — TYPE AND SCREEN
ABO/RH(D): A NEG
ANTIBODY SCREEN: NEGATIVE

## 2017-11-25 MED ORDER — CLONIDINE HCL 0.1 MG PO TABS
0.1000 mg | ORAL_TABLET | Freq: Once | ORAL | Status: AC
  Administered 2017-11-25: 0.1 mg via ORAL
  Filled 2017-11-25: qty 1

## 2017-11-25 NOTE — ED Provider Notes (Signed)
[facility] EMERGENCY DEPARTMENT Provider Note   CSN: [id number] Arrival date & time: 11/25/17  1412     History   Chief Complaint Chief Complaint  Patient presents with  . GI Bleeding    HPI Duane Adams is a 62 y.o. male.  HPI This patient presents with concern of rectal bleeding. He notes that over the past 3 weeks he has had bleeding with all bowel movements, that occur typically every morning. He has no bleeding associated with non-defecation periods. During this illness he has had progressive weakness, fatigability, but no new pain, including abdominal or chest pain. He denies syncope, though there is lightheadedness. Patient has a 3 prior colonoscopy, with multiple polyps removed during the first 2, and his most recent one, 8 months ago, was unremarkable according to him. He has one history of rectal bleeding in the distant past, he attributes to alcohol consumption. Otherwise, the patient was well until about 3 weeks ago. Now since onset symptoms been persistent, with no clear relieving or exacerbating factors. He denies a history of hemorrhoids or fissure.  Past Medical History:  Diagnosis Date  . Allergy   . Anxiety   . Cirrhosis ([place])   . Coronary artery disease    a. 03/2013 Cath/PCI: LM nl, LAD 41m (2.5x20 Promus Premier DES), LCX min irregs, RCA dom, 90p(3.0x20 Promus Premier DES), EF 55-65%.  . Essential hypertension, benign   . GERD (gastroesophageal reflux disease)   . Helicobacter pylori gastritis JUN 2016 EGD/Bx   PREVPAK BID FOR 14 DAYS  . Hepatitis    Hepatitis C treated   . Hiatal hernia   . Mixed hyperlipidemia   . Skin cancer   . 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 Tobacco abuse     Patient Active Problem List   Diagnosis Date Noted  . PAD (peripheral artery disease) ([place]) 05/05/2017  . Intermittent claudication ([place]) 05/05/2017  . Hepatitis C virus infection resolved after antiviral drug therapy 05/05/2017  . Cirrhosis of  liver ([place]) 05/05/2017  . Esophageal reflux 07/23/2015  . Nicotine dependence, cigarettes, with other nicotine-induced disorders 07/23/2015  . Hyperlipidemia 06/13/2015  . Colon polyps 01/14/2015  . Essential hypertension 04/12/2013  . Coronary artery disease   . Unspecified malignant neoplasm of skin of lower limb, including hip 03/16/2013    Past Surgical History:  Procedure Laterality Date  . ABDOMINAL AORTOGRAM W/LOWER EXTREMITY N/A 06/15/2017   Procedure: ABDOMINAL AORTOGRAM W/LOWER EXTREMITY;  Surgeon: Angelia Mould, MD;  Location: [facility];  Service: Cardiovascular;  Laterality: N/A;  Bilateral  . BIOPSY N/A 12/19/2014   Procedure: BIOPSY;  Surgeon: Danie Binder, MD;  Location: [facility];  Service: Endoscopy;  Laterality: N/A;  . COLONOSCOPY  June 2016   [facility]: with endoscopic mucosal resection. 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 Needs colonoscopy in 1 year.   . COLONOSCOPY WITH PROPOFOL N/A 12/19/2014   Dr. Oneida Alar: six simple adenomas and 3 hyperplastic polyps. Had flat mid-transverse colon polyp and referred to [facility] for endoscopic mucosal resection, which is scheduled for July 22  . CORONARY ANGIOPLASTY WITH STENT PLACEMENT  04/11/2013   LAD &  RCA     DR COOPER  . ESOPHAGOGASTRODUODENOSCOPY (EGD) WITH PROPOFOL N/A 12/19/2014   Dr. Oneida Alar:  without varices, small hiatal hernia noted, moderate non-erosive gastritis and mild duodenitis.  POSITIVE H.PYLORI. Prescribed Prevpac.  Marland Kitchen HAND RECONSTRUCTION Left   . LEFT HEART CATHETERIZATION WITH CORONARY ANGIOGRAM N/A  04/11/2013   Procedure: LEFT HEART CATHETERIZATION WITH CORONARY ANGIOGRAM;  Surgeon: Blane Ohara, MD;  Location: [facility] CATH LAB;  Service: Cardiovascular;  Laterality: N/A;  . LEG TENDON SURGERY Right   . POLYPECTOMY N/A 12/19/2014   Procedure: POLYPECTOMY;  Surgeon: Danie Binder, MD;  Location: [facility];  Service: Endoscopy;  Laterality: N/A;        Home Medications    Prior  to Admission medications   Medication Sig Start Date End Date Taking? Authorizing Provider  aspirin EC 81 MG tablet Take 81 mg by mouth at bedtime.     [provider]  atorvastatin (LIPITOR) 40 MG tablet Take 1 tablet (40 mg total) by mouth daily. 06/08/17   Raylene Everts, MD  chlorthalidone (HYGROTON) 25 MG tablet Take 1 tablet (25 mg total) by mouth daily. Take 12.5 mg daily.(1/2 tablet) Patient taking differently: Take 25 mg by mouth daily.  03/30/17   Arnoldo Lenis, MD  Cholecalciferol (VITAMIN D) 2000 units tablet Take 2,000 Units by mouth daily.    [provider]  cloNIDine (CATAPRES) 0.1 MG tablet Take 1 tablet (0.1 mg total) 2 (two) times daily by mouth. 05/25/17   Raylene Everts, MD  clopidogrel (PLAVIX) 75 MG tablet TAKE 1 TABLET BY MOUTH ONCE DAILY. 11/06/17   Arnoldo Lenis, MD  isosorbide mononitrate (IMDUR) 30 MG 24 hr tablet Take 0.5 tablets (15 mg total) by mouth daily. 11/27/16   Arnoldo Lenis, MD  metoprolol succinate (TOPROL-XL) 50 MG 24 hr tablet Take 50 mg daily. ( 1 tablet) Patient taking differently: Take 50 mg by mouth daily.  03/30/17   Arnoldo Lenis, MD  Multiple Vitamin (MULTIVITAMIN WITH MINERALS) TABS tablet Take 1 tablet by mouth daily.    [provider]  nitroGLYCERIN (NITROSTAT) 0.4 MG SL tablet Place 1 tablet (0.4 mg total) under the tongue every 5 (five) minutes as needed for chest pain. 04/12/13   Theora Gianotti, NP  pantoprazole (PROTONIX) 40 MG tablet TAKE ONE TABLET BY MOUTH ONCE DAILY. 09/25/17   Mahala Menghini, PA-C  potassium chloride SA (K-DUR,KLOR-CON) 20 MEQ tablet Take 1 tablet (20 mEq total) by mouth daily. 12/16/16   Arnoldo Lenis, MD    Family History Family History  Problem Relation Age of Onset  . Stroke Mother   . Aneurysm Mother   . Hypertension Mother   . Heart disease Father   . Hypertension Father   . Heart disease Brother   . Hypertension Brother   . Alcohol abuse  Brother   . Diabetes Paternal Aunt   . Colon cancer Neg Hx     Social History Social History   Tobacco Use  . Smoking status: Current Every Day Smoker    Packs/day: 0.50    Years: 40.00    Pack years: 20.00    Types: Cigarettes    Start date: 07/14/1968  . Smokeless tobacco: Never Used  . Tobacco comment: 5-6 Cigarettes per day  Substance Use Topics  . Alcohol use: Yes    Alcohol/week: 0.0 oz    Comment: social  . Drug use: No     Allergies   Lisinopril; Chantix [varenicline]; and Neosporin [neomycin-bacitracin zn-polymyx]   Review of Systems Review of Systems  Constitutional:       Per HPI, otherwise negative  HENT:       Per HPI, otherwise negative  Respiratory:       Per HPI, otherwise negative  Cardiovascular:  Per HPI, otherwise negative  Gastrointestinal: Positive for blood in stool. Negative for vomiting.  Endocrine:       Negative aside from HPI  Genitourinary:       Neg aside from HPI   Musculoskeletal:       Per HPI, otherwise negative  Skin: Negative.   Neurological: Positive for weakness and light-headedness. Negative for syncope.     Physical Exam Updated Vital Signs BP (!) 200/100 (BP Location: Right Arm)   Pulse (!) 110   Temp 97.9 F (36.6 C) (Oral)   Resp 18   SpO2 96%   Physical Exam  Constitutional: He is oriented to person, place, and time. He appears well-developed. No distress.  HENT:  Head: Normocephalic and atraumatic.  Eyes: Conjunctivae and EOM are normal.  Cardiovascular: Normal rate and regular rhythm.  Pulmonary/Chest: Effort normal. No stridor. No respiratory distress.  Abdominal: He exhibits no distension. There is no tenderness. There is no guarding.  Genitourinary: Rectal exam shows external hemorrhoid. Rectal exam shows no fissure and guaiac negative stool.  Musculoskeletal: He exhibits no edema.  Neurological: He is alert and oriented to person, place, and time.  Skin: Skin is warm and dry.  Psychiatric: He  has a normal mood and affect.  Nursing note and vitals reviewed.    ED Treatments / Results  Labs (all labs ordered are listed, but only abnormal results are displayed) Labs Reviewed  CBC - Abnormal; Notable for the following components:      Result Value   Hemoglobin 10.4 (*)    MCH 23.4 (*)    MCHC 25.6 (*)    All other components within normal limits  COMPREHENSIVE METABOLIC PANEL  POC OCCULT BLOOD, ED  TYPE AND SCREEN    Procedures Procedures (including critical care time)  Medications Ordered in ED Medications  cloNIDine (CATAPRES) tablet 0.1 mg (0.1 mg Oral Given 11/25/17 1458)     Initial Impression / Assessment and Plan / ED Course  I have reviewed the triage vital signs and the nursing notes.  Pertinent labs & imaging results that were available during my care of the patient were reviewed by me and considered in my medical decision making (see chart for details).    4:54 PM On repeat exam the patient is awake and alert. On performing a rectal exam, patient is in no distress, but does have some discomfort with evaluation. No gross bleeding, there is one external hemorrhoid, Hemoccult negative result. We reviewed all findings including hemoglobin value of 10, down from baseline 15.    Discussed patient's case with her gastroenterologist on call, and the patient will be seen tomorrow morning in the office. On repeat exam the patient is awake, alert, hemodynamically unremarkable. I discussed the importance of following up tomorrow, and return precautions. With no evidence for hemodynamic instability, the patient does have ongoing bleeding, he will follow up closely.  Final Clinical Impressions(s) / ED Diagnoses   Final diagnoses:  Rectal bleed     Carmin Muskrat, MD 11/25/17 1751

## 2017-11-25 NOTE — ED Notes (Signed)
Phlebotomist getting more blood work

## 2017-11-25 NOTE — Discharge Instructions (Addendum)
As discussed, today's evaluation has demonstrated a substantial drop in your hemoglobin value. Given your ongoing bleeding, it is important he follow-up with our gastroenterology colleagues tomorrow. If you do not hear from them, please call in the morning to be seen tomorrow.  Return here for concerning changes in your condition.

## 2017-11-25 NOTE — ED Triage Notes (Signed)
Pt states he has been having bright red rectal bleeding with blood clots for the past 3 weeks. Denies hx of hemorrhoids or polyps, had a colonoscopy 8 months ago with normal result. Has run out of his Clonidine two days ago. BP 200/100. Pt is on plavix.

## 2017-11-25 NOTE — ED Notes (Signed)
Pt states bright red blood when he poops each morning. No more blood for the rest of the day. Denies dizziness.

## 2017-11-26 ENCOUNTER — Encounter: Payer: Self-pay | Admitting: Nurse Practitioner

## 2017-11-26 ENCOUNTER — Telehealth: Payer: Self-pay

## 2017-11-26 ENCOUNTER — Other Ambulatory Visit: Payer: Self-pay

## 2017-11-26 ENCOUNTER — Ambulatory Visit (INDEPENDENT_AMBULATORY_CARE_PROVIDER_SITE_OTHER): Payer: Medicaid Other | Admitting: Nurse Practitioner

## 2017-11-26 DIAGNOSIS — K649 Unspecified hemorrhoids: Secondary | ICD-10-CM | POA: Diagnosis not present

## 2017-11-26 DIAGNOSIS — Z860100 Personal history of colon polyps, unspecified: Secondary | ICD-10-CM

## 2017-11-26 DIAGNOSIS — K625 Hemorrhage of anus and rectum: Secondary | ICD-10-CM

## 2017-11-26 MED ORDER — HYDROCORTISONE ACETATE 25 MG RE SUPP: 25.0000 mg | Freq: Two times a day (BID) | RECTAL | 2 refills | Status: DC | PRN

## 2017-11-26 NOTE — Progress Notes (Addendum)
REVIEWED-NO ADDITIONAL RECOMMENDATIONS.  Referring Provider: Doree Albee, MD Primary Care Physician:  Doree Albee, MD Primary GI:  Dr. Oneida Alar  Chief Complaint  Patient presents with  . Rectal Bleeding    bright red with clots, water in toliet turns red and fills up    HPI:   Duane Adams is a 62 y.o. male who presents for ER follow-up related to rectal bleeding.  The patient was last seen in our office 04/21/2017 for chronic hepatitis C and colon polyp.  Has history of hepatitis C fibrosis score F2/F3 status post Harvoni treatment in 2016 and documented SVR.  Endoscopic mucosal resection about this or tubular adenoma with focal high-grade dysplasia in June 2016 with recent surveillance at [facility] in the summer 2018.  Denies alcohol, takes Protonix daily.  "Does not feel right."  No other GI symptoms.  Has dyspnea on exertion which limits his exercise tolerance.  Also joint pain.  Recommended requesting previous colonoscopy records.  No noted stigmata of advanced liver disease, recommend alcohol abstinence.  Recommended ultrasound in 6 months, follow-up in 1 year.  It appears his most recent colonoscopy was completed 47-month 2018.  No pathology report or procedure report available in care everywhere.  However, a fax copy of his colonoscopy report was received.  It is been marked for scanning.  Confirmed procedure date of 12/12/2016.  Endoscopic impression of normal colonoscopy.  Recommended 3-year repeat exam due to personal history of large, flat colon polyps.  He will be next due in 2021.  The patient was in the emergency department yesterday, 11/25/2017 for rectal bleeding.  This is been occurring for 3 weeks, with bowel movements, every morning.  No bleeding associated with non-defecation.  Noted progressive weakness, fatigue, lightheadedness but denies syncope.  On rectal exam it was noted he has an external hemorrhoid, heme-negative stool.  However, his hemoglobin was  10.4 which is declined from 15.65 months ago.  Noted to be normocytic but hypochromic.  Today he states he's doing ok overall. Confirmed previous colonoscopy results. Started having brbpr 3 weeks ago, only with bowel movement. Described as a lot, also with clots. After using the bathroom has some discomfort when sitting. Denies melena. Denies known history of hemorrhoids. Was told "it's a little one, it wasn't swollen or bleeding." Had bleeding like this 20 years ago and lasted 2 days due to excessive ETOH intake on vacation. Denies any recent ETOH, rare/social drinking currently (1-2 beers about 1-2 times a month.) Denies abdominal pain, N/V, fever, chills, unintentional weight loss. Does have some abdominal bloating. Having weakness, fatigue. Did have a couple occurrences of dizziness which rapidly self-resolved. Denies syncope. Denies any other upper or lower GI symptoms.  He is on Plavix and ASA due to CAD s/p PCI (last PCI 2014).   Past Medical History:  Diagnosis Date  . Allergy   . Anxiety   . Cirrhosis ([place])   . Coronary artery disease    a. 03/2013 Cath/PCI: LM nl, LAD 18m (2.5x20 Promus Premier DES), LCX min irregs, RCA dom, 90p(3.0x20 Promus Premier DES), EF 55-65%.  . Essential hypertension, benign   . GERD (gastroesophageal reflux disease)   . Helicobacter pylori gastritis JUN 2016 EGD/Bx   PREVPAK BID FOR 14 DAYS  . Hepatitis    Hepatitis C treated   . Hiatal hernia   . Mixed hyperlipidemia   . Skin cancer   . Substance abuse ([place])    alcoholic quit 8938  . Tobacco abuse  Past Surgical History:  Procedure Laterality Date  . ABDOMINAL AORTOGRAM W/LOWER EXTREMITY N/A 06/15/2017   Procedure: ABDOMINAL AORTOGRAM W/LOWER EXTREMITY;  Surgeon: Angelia Mould, MD;  Location: [place] [facility];  Service: Cardiovascular;  Laterality: N/A;  Bilateral  . BIOPSY N/A 12/19/2014   Procedure: BIOPSY;  Surgeon: Danie Binder, MD;  Location: [facility];  Service: Endoscopy;   Laterality: N/A;  . COLONOSCOPY  June 2016   [facility]: with endoscopic mucosal resection. 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 Needs colonoscopy in 1 year.   . COLONOSCOPY WITH PROPOFOL N/A 12/19/2014   Dr. Oneida Alar: six simple adenomas and 3 hyperplastic polyps. Had flat mid-transverse colon polyp and referred to [facility] for endoscopic mucosal resection, which is scheduled for July 22  . CORONARY ANGIOPLASTY WITH STENT PLACEMENT  04/11/2013   LAD &  RCA     DR COOPER  . ESOPHAGOGASTRODUODENOSCOPY (EGD) WITH PROPOFOL N/A 12/19/2014   Dr. Oneida Alar:  without varices, small hiatal hernia noted, moderate non-erosive gastritis and mild duodenitis.  POSITIVE H.PYLORI. Prescribed Prevpac.  Marland Kitchen HAND RECONSTRUCTION Left   . LEFT HEART CATHETERIZATION WITH CORONARY ANGIOGRAM N/A 04/11/2013   Procedure: LEFT HEART CATHETERIZATION WITH CORONARY ANGIOGRAM;  Surgeon: Blane Ohara, MD;  Location: [facility];  Service: Cardiovascular;  Laterality: N/A;  . LEG TENDON SURGERY Right   . POLYPECTOMY N/A 12/19/2014   Procedure: POLYPECTOMY;  Surgeon: Danie Binder, MD;  Location: [facility];  Service: Endoscopy;  Laterality: N/A;    Current Outpatient Medications  Medication Sig Dispense Refill  . aspirin EC 81 MG tablet Take 81 mg by mouth at bedtime.     Marland Kitchen atorvastatin (LIPITOR) 40 MG tablet Take 1 tablet (40 mg total) by mouth daily. 90 tablet 3  . chlorthalidone (HYGROTON) 25 MG tablet Take 1 tablet (25 mg total) by mouth daily. Take 12.5 mg daily.(1/2 tablet) (Patient taking differently: Take 25 mg by mouth daily. ) 90 tablet 3  . Cholecalciferol (VITAMIN D3) 2000 units TABS Take 1 tablet by mouth daily.    . cloNIDine (CATAPRES) 0.1 MG tablet Take 1 tablet (0.1 mg total) 2 (two) times daily by mouth. 180 tablet 1  . clopidogrel (PLAVIX) 75 MG tablet TAKE 1 TABLET BY MOUTH ONCE DAILY. 30 tablet 0  . isosorbide mononitrate (IMDUR) 30 MG 24 hr tablet Take 0.5 tablets (15 mg total) by mouth daily. 45  tablet 3  . metoprolol succinate (TOPROL-XL) 50 MG 24 hr tablet Take 50 mg daily. ( 1 tablet) (Patient taking differently: Take 50 mg by mouth daily. ) 90 tablet 3  . Multiple Vitamin (MULTIVITAMIN WITH MINERALS) TABS tablet Take 1 tablet by mouth daily.    . nitroGLYCERIN (NITROSTAT) 0.4 MG SL tablet Place 1 tablet (0.4 mg total) under the tongue every 5 (five) minutes as needed for chest pain. 25 tablet 3  . pantoprazole (PROTONIX) 40 MG tablet TAKE ONE TABLET BY MOUTH ONCE DAILY. 90 tablet 3  . potassium chloride SA (K-DUR,KLOR-CON) 20 MEQ tablet Take 1 tablet (20 mEq total) by mouth daily. 90 tablet 3   No current facility-administered medications for this visit.     Allergies as of 11/26/2017 - Review Complete 11/26/2017  Allergen Reaction Noted  . Lisinopril Swelling 08/18/2012  . Chantix [varenicline] Other (See Comments) 07/20/2017  . Neosporin [neomycin-bacitracin zn-polymyx] Rash 10/28/2013    Family History  Problem Relation Age of Onset  . Stroke Mother   . Aneurysm Mother   . Hypertension  Mother   . Heart disease Father   . Hypertension Father   . Heart disease Brother   . Hypertension Brother   . Alcohol abuse Brother   . Diabetes Paternal Aunt   . Colon cancer Neg Hx     Social History   Socioeconomic History  . Marital status: Soil scientist    Spouse name: Judie Petit  . Number of children: 2  . Years of education: 76  . Highest education level: Not on file  Occupational History  . Occupation: disabled    Comment: heart  Social Needs  . Financial resource strain: Not on file  . Food insecurity:    Worry: Not on file    Inability: Not on file  . Transportation needs:    Medical: Not on file    Non-medical: Not on file  Tobacco Use  . Smoking status: Current Every Day Smoker    Packs/day: 0.50    Years: 40.00    Pack years: 20.00    Types: Cigarettes    Start date: 07/14/1968  . Smokeless tobacco: Never Used  . Tobacco comment: 5-6 Cigarettes per day   Substance and Sexual Activity  . Alcohol use: Yes    Alcohol/week: 0.0 oz    Comment: As of 11/26/17: social - 1-2 beers a sitting x 1-2 times a month  . Drug use: No  . Sexual activity: Not Currently  Lifestyle  . Physical activity:    Days per week: Not on file    Minutes per session: Not on file  . Stress: Not on file  Relationships  . Social connections:    Talks on phone: Not on file    Gets together: Not on file    Attends religious service: Not on file    Active member of club or organization: Not on file    Attends meetings of clubs or organizations: Not on file    Relationship status: Not on file  Other Topics Concern  . 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   Physical Exam: BP 140/73   Pulse 98   Temp (!) 97 F (36.1 C) (Oral)   Ht 5\' 9"  (1.753 m)   Wt 194 lb (88 kg)   BMI 28.65 kg/m  General:   Alert and oriented. Pleasant and cooperative. Well-nourished and well-developed.  Eyes:  Without icterus, sclera clear and conjunctiva pink.  Ears:  Normal auditory acuity. Cardiovascular:  S1, S2 present without murmurs appreciated. Extremities without clubbing or edema. Respiratory:  Clear to auscultation bilaterally. No wheezes, rales, or rhonchi. No distress.  Gastrointestinal:  +BS, soft, non-tender and non-distended. No HSM noted. No guarding or rebound. No masses appreciated.  Rectal:  Deferred  Musculoskalatal:  Symmetrical without gross deformities. Neurologic:  Alert and oriented x4;  grossly normal neurologically. Psych:  Alert and cooperative. Normal mood and affect. Heme/Lymph/Immune: No excessive bruising noted.    11/26/2017 3:04 PM   Disclaimer: This note was dictated with voice recognition software. Similar sounding words can inadvertently be transcribed and may not  be corrected upon review.

## 2017-11-26 NOTE — Telephone Encounter (Signed)
Rourk, Cristopher Estimable, MD  Claudina Lick, LPN; Derrick Ravel, CMA        EDP called me about patient presenting to the ER with rectal bleeding. However, EDP did a rectal exam and found brown stool which was Hemoccult negative  He reports hemoglobin down to 10 compared to 15 last year. Did not appear acutely ill.  Outpatient follow-up recommended.  Please call him in the morning and see how he is doing. Would repeat an H&H on May 17. Needs a follow-up in the office. Urgency to be determined by how he is doing over the next 2 days.   EDP gave me this number for good contact: [phone]. Thanks.    AM-Spoke with pt and he has had rectal bleeding x 3 weeks. Pt has had some dizziness but doesn't feel bad. Pt was scheduled for 2:30 today 11/26/17 with EG per RMR

## 2017-11-26 NOTE — Patient Instructions (Signed)
1. I sent in a prescription for suppositories to help with your internal hemorrhoids.  You can insert 1 of these up to twice a day for up to 10 days at a time, if needed for rectal bleeding or rectal discomfort. 2. Have your labs checked on Monday. 3. I will talk with Dr. Oneida Alar about possible doing this flexible sigmoidoscopy with hemorrhoid banding while you are on Plavix. 4. Return for follow-up in 4 to 6 weeks. 5. If you have worsening weakness, dizziness, lightheadedness, chest pain, shortness of breath or significant worsening of bleeding and proceed to the emergency department or call our office if we are open. 6. Call us if you have any questions or concerns  At [facility] we value your feedback. You may receive a survey about your visit today. Please share your experience as we strive to create trusting relationships with our patients to provide genuine, compassionate, quality care.  Was good to meet you today!  I hope you have a great summer!!

## 2017-11-27 NOTE — Progress Notes (Signed)
CC'ED TO PCP 

## 2017-11-28 LAB — CBC WITH DIFFERENTIAL/PLATELET
Basophils Absolute: 64 cells/uL (ref 0–200)
Basophils Relative: 0.7 %
Eosinophils Absolute: 239 cells/uL (ref 15–500)
Eosinophils Relative: 2.6 %
HEMATOCRIT: 41.1 % (ref 38.5–50.0)
Hemoglobin: 14.4 g/dL (ref 13.2–17.1)
LYMPHS ABS: 3018 {cells}/uL (ref 850–3900)
MCH: 30.7 pg (ref 27.0–33.0)
MCHC: 35 g/dL (ref 32.0–36.0)
MCV: 87.6 fL (ref 80.0–100.0)
MPV: 10.8 fL (ref 7.5–12.5)
Monocytes Relative: 9.2 %
NEUTROS PCT: 54.7 %
Neutro Abs: 5032 cells/uL (ref 1500–7800)
Platelets: 232 10*3/uL (ref 140–400)
RBC: 4.69 10*6/uL (ref 4.20–5.80)
RDW: 13.3 % (ref 11.0–15.0)
Total Lymphocyte: 32.8 %
WBC: 9.2 10*3/uL (ref 3.8–10.8)
WBCMIX: 846 {cells}/uL (ref 200–950)

## 2017-11-28 LAB — IRON: Iron: 80 ug/dL (ref 50–180)

## 2017-11-28 LAB — FERRITIN: FERRITIN: 56 ng/mL (ref 20–380)

## 2017-12-08 ENCOUNTER — Other Ambulatory Visit: Payer: Self-pay

## 2017-12-08 DIAGNOSIS — K649 Unspecified hemorrhoids: Secondary | ICD-10-CM

## 2017-12-08 DIAGNOSIS — K625 Hemorrhage of anus and rectum: Secondary | ICD-10-CM

## 2017-12-08 NOTE — Progress Notes (Signed)
PT is aware and is willing to have it if it is covered by Medicaid. He said to please call him and let him know.

## 2017-12-11 ENCOUNTER — Other Ambulatory Visit: Payer: Self-pay | Admitting: Cardiology

## 2017-12-11 DIAGNOSIS — E782 Mixed hyperlipidemia: Secondary | ICD-10-CM

## 2017-12-23 ENCOUNTER — Other Ambulatory Visit: Payer: Self-pay | Admitting: Cardiology

## 2017-12-23 ENCOUNTER — Encounter (HOSPITAL_COMMUNITY): Payer: Self-pay | Admitting: *Deleted

## 2017-12-23 ENCOUNTER — Other Ambulatory Visit: Payer: Self-pay

## 2017-12-23 ENCOUNTER — Ambulatory Visit (HOSPITAL_COMMUNITY)
Admission: RE | Admit: 2017-12-23 | Discharge: 2017-12-23 | Disposition: A | Discharge status: Home / Self Care | Payer: Medicaid Other | Source: Ambulatory Visit | Attending: Gastroenterology | Admitting: Gastroenterology

## 2017-12-23 ENCOUNTER — Encounter (HOSPITAL_COMMUNITY)
Admission: RE | Disposition: A | Discharge status: Home / Self Care | Payer: Self-pay | Source: Ambulatory Visit | Attending: Gastroenterology

## 2017-12-23 DIAGNOSIS — F17210 Nicotine dependence, cigarettes, uncomplicated: Secondary | ICD-10-CM | POA: Insufficient documentation

## 2017-12-23 DIAGNOSIS — K625 Hemorrhage of anus and rectum: Secondary | ICD-10-CM

## 2017-12-23 DIAGNOSIS — Z79899 Other long term (current) drug therapy: Secondary | ICD-10-CM | POA: Insufficient documentation

## 2017-12-23 DIAGNOSIS — Z7982 Long term (current) use of aspirin: Secondary | ICD-10-CM | POA: Insufficient documentation

## 2017-12-23 DIAGNOSIS — K921 Melena: Secondary | ICD-10-CM

## 2017-12-23 DIAGNOSIS — K219 Gastro-esophageal reflux disease without esophagitis: Secondary | ICD-10-CM | POA: Diagnosis not present

## 2017-12-23 DIAGNOSIS — I10 Essential (primary) hypertension: Secondary | ICD-10-CM | POA: Insufficient documentation

## 2017-12-23 DIAGNOSIS — E782 Mixed hyperlipidemia: Secondary | ICD-10-CM | POA: Diagnosis not present

## 2017-12-23 DIAGNOSIS — I2510 Atherosclerotic heart disease of native coronary artery without angina pectoris: Secondary | ICD-10-CM | POA: Diagnosis not present

## 2017-12-23 DIAGNOSIS — K649 Unspecified hemorrhoids: Secondary | ICD-10-CM

## 2017-12-23 DIAGNOSIS — Z7902 Long term (current) use of antithrombotics/antiplatelets: Secondary | ICD-10-CM | POA: Diagnosis not present

## 2017-12-23 DIAGNOSIS — Z888 Allergy status to other drugs, medicaments and biological substances status: Secondary | ICD-10-CM | POA: Insufficient documentation

## 2017-12-23 DIAGNOSIS — K648 Other hemorrhoids: Secondary | ICD-10-CM | POA: Diagnosis not present

## 2017-12-23 DIAGNOSIS — Z881 Allergy status to other antibiotic agents status: Secondary | ICD-10-CM | POA: Insufficient documentation

## 2017-12-23 DIAGNOSIS — Z955 Presence of coronary angioplasty implant and graft: Secondary | ICD-10-CM | POA: Diagnosis not present

## 2017-12-23 HISTORY — PX: HEMORRHOID BANDING: SHX5850

## 2017-12-23 HISTORY — PX: FLEXIBLE SIGMOIDOSCOPY: SHX5431

## 2017-12-23 HISTORY — DX: Personal history of urinary calculi: Z87.442

## 2017-12-23 SURGERY — SIGMOIDOSCOPY, FLEXIBLE
Anesthesia: Moderate Sedation

## 2017-12-23 MED ORDER — MIDAZOLAM HCL 5 MG/5ML IJ SOLN
INTRAMUSCULAR | Status: AC
  Filled 2017-12-23: qty 10

## 2017-12-23 MED ORDER — MIDAZOLAM HCL 5 MG/5ML IJ SOLN
INTRAMUSCULAR | Status: DC | PRN
  Administered 2017-12-23: 1 mg via INTRAVENOUS
  Administered 2017-12-23: 2 mg via INTRAVENOUS
  Administered 2017-12-23: 1 mg via INTRAVENOUS

## 2017-12-23 MED ORDER — MEPERIDINE HCL 100 MG/ML IJ SOLN
INTRAMUSCULAR | Status: DC | PRN
  Administered 2017-12-23: 25 mg via INTRAVENOUS

## 2017-12-23 MED ORDER — SODIUM CHLORIDE 0.9 % IV SOLN
INTRAVENOUS | Status: DC
  Administered 2017-12-23: 1000 mL via INTRAVENOUS

## 2017-12-23 MED ORDER — MEPERIDINE HCL 100 MG/ML IJ SOLN
INTRAMUSCULAR | Status: AC
  Filled 2017-12-23: qty 2

## 2017-12-23 NOTE — Discharge Instructions (Signed)
HEMORRHOIDAL BANDING DISCHARGE INSTRUCTIONS  I PUT BANDS IN 3 AREAS ABOVE YOUR HEMORRHOIDS. PLEASE CALL [phone] FOR FOR RECTAL BLEEDING, FEVER, PAIN, OR DIFFICULTY URINATING. YOU MAY SEE BLEEDING FOR 3 DAYS TO 2 WEEKS.    HOME CARE INSTRUCTIONS  FOLLOWING YOUR PROCEDURE IT IS COMMON TO HAVE MINOR RECTAL DISCOMFORT OR PAIN. 1.  YOU may use ibuprofen 200 MG over-the-counter 2 every 6 hours IF needed for mild rectal pain OR use TYLENOL.   Take the ibuprofen with food and milk.  2   Use PREPARATION H IF NEEDED FOR RECTAL PAIN.  3. Avoid straining to have bowel movements. 4. Keep anal area dry and clean.  5. Do not use a donut shaped pillow or sit on the toilet for long periods. This increases blood pooling and pain.  6. Move your bowels when your body has the urge; this will require less straining and will decrease pain and pressure.  7. IF NEEDED USE Colace 100 mg twice daily FOR 7 DAYS to soften the stool. HOLD FOR DIARRHEA. 8. FOLLOW UP IN 3 MOS.    SIGMOIDOSCOPY Care After Read the instructions outlined below and refer to this sheet in the next week. These discharge instructions provide you with general information on caring for yourself after you leave the hospital. While your treatment has been planned according to the most current medical practices available, unavoidable complications occasionally occur. If you have any problems or questions after discharge, call DR. FIELDS, [phone].  ACTIVITY  You may resume your regular activity, but move at a slower pace for the next 24 hours.   Take frequent rest periods for the next 24 hours.   Walking will help get rid of the air and reduce the bloated feeling in your belly (abdomen).   No driving for 24 hours (because of the medicine (anesthesia) used during the test).   You may shower.   Do not sign any important legal documents or operate any machinery for 24 hours (because of the anesthesia used during the test).     NUTRITION  Drink plenty of fluids.   You may resume your normal diet as instructed by your doctor.   Begin with a light meal and progress to your normal diet. Heavy or fried foods are harder to digest and may make you feel sick to your stomach (nauseated).   Avoid alcoholic beverages for 24 hours or as instructed.    MEDICATIONS  You may resume your normal medications.   WHAT YOU CAN EXPECT TODAY  Some feelings of bloating in the abdomen.   Passage of more gas than usual.   Spotting of blood in your stool or on the toilet paper  .  IF YOU HAD POLYPS REMOVED DURING THE COLONOSCOPY:  Eat a soft diet IF YOU HAVE NAUSEA, BLOATING, ABDOMINAL PAIN, OR VOMITING.    FINDING OUT THE RESULTS OF YOUR TEST Not all test results are available during your visit. DR. Oneida Alar WILL CALL YOU WITHIN 7 DAYS OF YOUR PROCEDUE WITH YOUR RESULTS. Do not assume everything is normal if you have not heard from DR. FIELDS IN ONE WEEK, CALL HER OFFICE AT [phone].  SEEK IMMEDIATE MEDICAL ATTENTION AND CALL THE OFFICE: [phone] IF:  You have more than a spotting of blood in your stool.   Your belly is swollen (abdominal distention).   You are nauseated or vomiting.   You have a temperature over 101F.   You have abdominal pain or discomfort that is severe or gets worse  throughout the day.  Hemorrhoids Hemorrhoids are dilated (enlarged) veins around the rectum. Sometimes clots will form in the veins. This makes them swollen and painful. These are called thrombosed hemorrhoids. Causes of hemorrhoids include:  Constipation.   Straining to have a bowel movement.   HEAVY LIFTING  HOME CARE INSTRUCTIONS  Eat a well balanced diet and drink 6 to 8 glasses of water every day to avoid constipation. You may also use a bulk laxative.   Avoid straining to have bowel movements.   Keep anal area dry and clean.   Do not use a donut shaped pillow or sit on the toilet for long periods. This  increases blood pooling and pain.   Move your bowels when your body has the urge; this will require less straining and will decrease pain and pressure.     HEMORRHOIDAL BANDING COMPLICATIONS:  COMMON: 1. MINOR PAIN  UNCOMMON: 1. ABSCESS 2. BAND FALLS OFF 3. PROLAPSE OF HEMORRHOIDS AND PAIN 4. ULCER BLEEDING  A. USUALLY SELF-LIMITED: MAY LAST 3-5 DAYS  B. MAY REQUIRE INTERVENTION: 1-2 WEEKS AFTER INTERACTIONS 5. NECROTIZING PELVIC SEPSIS  A. SYMPTOMS: FEVER, PAIN, DIFFICULTY URINATING

## 2017-12-23 NOTE — H&P (Signed)
Primary Care Physician:  Doree Albee, MD Primary Gastroenterologist:  Dr. Oneida Alar  Pre-Procedure History & Physical: HPI:  Duane Adams is a 62 y.o. male here for  RECTAL BLEEDING/pain.  Past Medical History:  Diagnosis Date  . Allergy   . Anxiety   . Cirrhosis ([place])   . Coronary artery disease    a. 03/2013 Cath/PCI: LM nl, LAD 37m (2.5x20 Promus Premier DES), LCX min irregs, RCA dom, 90p(3.0x20 Promus Premier DES), EF 55-65%.  . Essential hypertension, benign   . GERD (gastroesophageal reflux disease)   . Helicobacter pylori gastritis JUN 2016 EGD/Bx   PREVPAK BID FOR 14 DAYS  . Hepatitis    Hepatitis C treated   . Hiatal hernia   . History of kidney stones   . Mixed hyperlipidemia   . Skin cancer   . Substance abuse ([place])    alcoholic quit 2355  . Tobacco abuse     Past Surgical History:  Procedure Laterality Date  . ABDOMINAL AORTOGRAM W/LOWER EXTREMITY N/A 06/15/2017   Procedure: ABDOMINAL AORTOGRAM W/LOWER EXTREMITY;  Surgeon: Angelia Mould, MD;  Location: [place] [facility];  Service: Cardiovascular;  Laterality: N/A;  Bilateral  . BIOPSY N/A 12/19/2014   Procedure: BIOPSY;  Surgeon: Danie Binder, MD;  Location: [facility];  Service: Endoscopy;  Laterality: N/A;  . COLONOSCOPY  June 2016   [facility]: with endoscopic mucosal resection. 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 Needs colonoscopy in 1 year.   . COLONOSCOPY WITH PROPOFOL N/A 12/19/2014   Dr. Oneida Alar: six simple adenomas and 3 hyperplastic polyps. Had flat mid-transverse colon polyp and referred to [facility] for endoscopic mucosal resection, which is scheduled for July 22  . CORONARY ANGIOPLASTY WITH STENT PLACEMENT  04/11/2013   LAD &  RCA     DR COOPER  . ESOPHAGOGASTRODUODENOSCOPY (EGD) WITH PROPOFOL N/A 12/19/2014   Dr. Oneida Alar:  without varices, small hiatal hernia noted, moderate non-erosive gastritis and mild duodenitis.  POSITIVE H.PYLORI. Prescribed Prevpac.  Marland Kitchen HAND RECONSTRUCTION  Left   . LEFT HEART CATHETERIZATION WITH CORONARY ANGIOGRAM N/A 04/11/2013   Procedure: LEFT HEART CATHETERIZATION WITH CORONARY ANGIOGRAM;  Surgeon: Blane Ohara, MD;  Location: [facility] CATH LAB;  Service: Cardiovascular;  Laterality: N/A;  . LEG TENDON SURGERY Right   . POLYPECTOMY N/A 12/19/2014   Procedure: POLYPECTOMY;  Surgeon: Danie Binder, MD;  Location: [facility];  Service: Endoscopy;  Laterality: N/A;    Prior to Admission medications   Medication Sig Start Date End Date Taking? Authorizing Provider  aspirin EC 81 MG tablet Take 81 mg by mouth at bedtime.    Yes [provider]  atorvastatin (LIPITOR) 40 MG tablet Take 1 tablet (40 mg total) by mouth daily. 06/08/17  Yes Raylene Everts, MD  chlorthalidone (HYGROTON) 25 MG tablet Take 1 tablet (25 mg total) by mouth daily. Take 12.5 mg daily.(1/2 tablet) Patient taking differently: Take 25 mg by mouth daily.  03/30/17  Yes BranchAlphonse Guild, MD  Cholecalciferol (VITAMIN D3) 2000 units TABS Take 1 tablet by mouth daily.   Yes [provider]  cloNIDine (CATAPRES) 0.1 MG tablet Take 1 tablet (0.1 mg total) 2 (two) times daily by mouth. 05/25/17  Yes Raylene Everts, MD  clopidogrel (PLAVIX) 75 MG tablet TAKE 1 TABLET BY MOUTH ONCE DAILY. 12/11/17  Yes BranchAlphonse Guild, MD  isosorbide mononitrate (IMDUR) 30 MG 24 hr tablet Take 0.5 tablets (15 mg total) by mouth daily. 11/27/16  Yes Arnoldo Lenis, MD  metoprolol succinate (TOPROL-XL) 50 MG 24 hr tablet Take 50 mg daily. ( 1 tablet) Patient taking differently: Take 50 mg by mouth daily.  03/30/17  Yes BranchAlphonse Guild, MD  Multiple Vitamin (MULTIVITAMIN WITH MINERALS) TABS tablet Take 1 tablet by mouth daily.   Yes [provider]  pantoprazole (PROTONIX) 40 MG tablet TAKE ONE TABLET BY MOUTH ONCE DAILY. 09/25/17  Yes Mahala Menghini, PA-C  potassium chloride SA (K-DUR,KLOR-CON) 20 MEQ tablet Take 1 tablet (20 mEq total) by mouth daily. 12/16/16  Yes Branch,  Alphonse Guild, MD  hydrocortisone (ANUSOL-HC) 25 MG suppository Place 1 suppository (25 mg total) rectally 2 (two) times daily as needed for hemorrhoids or anal itching. 11/26/17   Carlis Stable, NP  isosorbide mononitrate (IMDUR) 30 MG 24 hr tablet TAKE 1/2 TABLET BY MOUTH ONCE DAILY. 12/23/17   Arnoldo Lenis, MD  nitroGLYCERIN (NITROSTAT) 0.4 MG SL tablet Place 1 tablet (0.4 mg total) under the tongue every 5 (five) minutes as needed for chest pain. 04/12/13   Theora Gianotti, NP    Allergies as of 12/08/2017 - Review Complete 11/26/2017  Allergen Reaction Noted  . Lisinopril Swelling 08/18/2012  . Chantix [varenicline] Other (See Comments) 07/20/2017  . Neosporin [neomycin-bacitracin zn-polymyx] Rash 10/28/2013    Family History  Problem Relation Age of Onset  . Stroke Mother   . Aneurysm Mother   . Hypertension Mother   . Heart disease Father   . Hypertension Father   . Heart disease Brother   . Hypertension Brother   . Alcohol abuse Brother   . Diabetes Paternal Aunt   . Colon cancer Neg Hx     Social History   Socioeconomic History  . Marital status: Soil scientist    Spouse name: Judie Petit  . Number of children: 2  . Years of education: 81  . Highest education level: Not on file  Occupational History  . Occupation: disabled    Comment: heart  Social Needs  . Financial resource strain: Not on file  . Food insecurity:    Worry: Not on file    Inability: Not on file  . Transportation needs:    Medical: Not on file    Non-medical: Not on file  Tobacco Use  . Smoking status: Current Every Day Smoker    Packs/day: 0.50    Years: 40.00    Pack years: 20.00    Types: Cigarettes    Start date: 07/14/1968  . Smokeless tobacco: Never Used  . Tobacco comment: 5-6 Cigarettes per day  Substance and Sexual Activity  . Alcohol use: Yes    Alcohol/week: 0.0 oz    Comment: As of 11/26/17: social - 1-2 beers a sitting x 1-2 times a month  . Drug use: No  . Sexual  activity: Not Currently  Lifestyle  . Physical activity:    Days per week: Not on file    Minutes per session: Not on file  . Stress: Not on file  Relationships  . Social connections:    Talks on phone: Not on file    Gets together: Not on file    Attends religious service: Not on file    Active member of club or organization: Not on file    Attends meetings of clubs or organizations: Not on file    Relationship status: Not on file  . Intimate partner violence:    Fear of current or ex partner: Not on file  Emotionally abused: Not on file    Physically abused: Not on file    Forced sexual activity: Not on file  Other Topics Concern  . 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 Neck:  Supple; Lungs:  Clear throughout to auscultation.    Heart:  Regular rate and rhythm. Abdomen:  Soft, nontender and nondistended. Normal bowel sounds, without guarding, and without rebound.   Neurologic:  Alert and  oriented x4;  grossly normal neurologically.  Impression/Plan:      RECTAL BLEEDING/pain  PLAN:  1. FLEX SIG-BANDING TODAY DISCUSSED PROCEDURE, BENEFITS, & RISKS: < 1% chance of medication reaction, bleeding, perforation, OR PELVIC VEIN SEPSIS

## 2017-12-23 NOTE — Op Note (Signed)
[facility] Patient Name: Duane Adams Procedure Date: 12/23/2017 2:43 PM MRN: [id number] Date of Birth: 08/01/1955 Attending MD: Barney Drain MD, MD CSN: [id number] Age: 62 Admit Type: Outpatient Procedure:                Flexible Sigmoidoscopy WITH HEMORRHOID BANDING Indications:              Hematochezia Providers:                Barney Drain MD, MD, Lurline Del, RN, Nelma Rothman,                            Technician Referring MD:             Doree Albee MD, MD Medicines:                Meperidine 25 mg IV, Midazolam 4 mg IV Complications:            No immediate complications. Estimated Blood Loss:     Estimated blood loss: none. Procedure:                Pre-Anesthesia Assessment:                           - Prior to the procedure, a History and Physical                            was performed, and patient medications and                            allergies were reviewed. The patient's tolerance of                            previous anesthesia was also reviewed. The risks                            and benefits of the procedure and the sedation                            options and risks were discussed with the patient.                            All questions were answered, and informed consent                            was obtained. Prior Anticoagulants: The patient                            last took aspirin 1 day prior to the procedure and                            last took Plavix (clopidogrel) on the day of the                            procedure. ASA Grade Assessment: II - A patient  with mild systemic disease. After reviewing the                            risks and benefits, the patient was deemed in                            satisfactory condition to undergo the procedure.                            After obtaining informed consent, the scope was                            passed under direct vision. The EG-[id number] ([id number])                           scope was introduced through the anus and advanced                            to the the sigmoid colon. The flexible                            sigmoidoscopy was accomplished without difficulty.                            The patient tolerated the procedure well. The                            quality of the bowel preparation was good. Scope In: 2:47:30 PM Scope Out: 2:57:35 PM Total Procedure Duration: 0 hours 10 minutes 5 seconds  Findings:      Internal hemorrhoids were found during retroflexion and during digital       exam. The hemorrhoids were large. Three bands were successfully placed.       There was no bleeding during the procedure. Estimated blood loss: none.      The exam was otherwise without abnormality. Impression:               - Internal hemorrhoids. Banded.                           - The examination was otherwise normal. Moderate Sedation:      Moderate (conscious) sedation was administered by the endoscopy nurse       and supervised by the endoscopist. The following parameters were       monitored: oxygen saturation, heart rate, blood pressure, and response       to care. Total physician intraservice time was 24 minutes. Recommendation:           - High fiber diet.                           - Continue present medications.                           - Return to my office in 3 months.                           -  Patient has a contact number available for                            emergencies. The signs and symptoms of potential                            delayed complications were discussed with the                            patient. Return to normal activities tomorrow.                            Written discharge instructions were provided to the                            patient. Procedure Code(s):        --- Professional ---                           [id number], Sigmoidoscopy, flexible; with band                            ligation(s) (eg,  hemorrhoids)                           G0500, Moderate sedation services provided by the                            same physician or other qualified health care                            professional performing a gastrointestinal                            endoscopic service that sedation supports,                            requiring the presence of an independent trained                            observer to assist in the monitoring of the                            patient's level of consciousness and physiological                            status; initial 15 minutes of intra-service time;                            patient age 69 years or older (additional time may                            be reported with [id number], as appropriate)  99153, Moderate sedation services provided by the                            same physician or other qualified health care                            professional performing the diagnostic or                            therapeutic service that the sedation supports,                            requiring the presence of an independent trained                            observer to assist in the monitoring of the                            patient's level of consciousness and physiological                            status; each additional 15 minutes intraservice                            time (List separately in addition to code for                            primary service) Diagnosis Code(s):        --- Professional ---                           K64.8, Other hemorrhoids                           K92.1, Melena (includes Hematochezia) CPT copyright 2017 American Medical Association. All rights reserved. The codes documented in this report are preliminary and upon coder review may  be revised to meet current compliance requirements. Barney Drain, MD Barney Drain MD, MD 12/23/2017 3:03:41 PM This report has been signed  electronically. Number of Addenda: 0

## 2017-12-31 ENCOUNTER — Encounter (HOSPITAL_COMMUNITY): Payer: Self-pay | Admitting: Gastroenterology

## 2018-01-05 ENCOUNTER — Ambulatory Visit: Payer: Medicaid Other | Admitting: Nurse Practitioner

## 2018-01-05 ENCOUNTER — Encounter: Payer: Self-pay | Admitting: Nurse Practitioner

## 2018-01-05 VITALS — BP 139/80 | HR 88 | Temp 97.3°F | Ht 69.0 in | Wt 191.0 lb

## 2018-01-05 DIAGNOSIS — K635 Polyp of colon: Secondary | ICD-10-CM | POA: Diagnosis not present

## 2018-01-05 DIAGNOSIS — K649 Unspecified hemorrhoids: Secondary | ICD-10-CM

## 2018-01-05 DIAGNOSIS — K625 Hemorrhage of anus and rectum: Secondary | ICD-10-CM | POA: Diagnosis not present

## 2018-01-05 NOTE — Progress Notes (Signed)
Referring Provider: Doree Albee, MD Primary Care Physician:  Doree Albee, MD Primary GI:  Dr. Oneida Alar  Chief Complaint  Patient presents with  . Rectal Bleeding    f/u. Bleeding has stopped after banding    HPI:   Duane Adams is a 62 y.o. male who presents for follow-up on rectal bleeding.  The patient was last seen in our office 11/26/2017 for rectal bleeding, hemorrhoids, history of colon polyps.  3 of chronic hepatitis C fibrosis score F2/F3 status post Harvoni treatment in 2016 and documented sustained SVR.  Most recent colonoscopy in 2018 with report received and noting normal colonoscopy with recommended 3-year repeat exam due to personal history of large, flat colon polyps.  Next due in 2021.  Was seen prior to his last visit in the emergency department for rectal bleeding for 3 weeks with bowel movements and every morning.  On rectal exam noted to have an external hemorrhoid, heme-negative stool.  However, his hemoglobin was 10.4 which is declined from 15.6 approximately 5 months prior.  Noted to be normocytic but hypochromic.  At his last visit he confirmed colonoscopy reports.  Noted bright red blood per rectum 3 weeks prior only with bowel movements, described as "a lot" and also with clots noted.  Some hemorrhoid symptoms as well but denies known hemorrhoids.  Previous occurrence of similar symptoms 20 years prior after excessive alcohol intake but at his last visit he denied any recent alcohol.  No other GI symptoms.  On Plavix and aspirin due to CAD status post PCI in 2014.  Recommended rectal suppositories, follow-up labs, flex will sigmoidoscopy with hemorrhoid banding on Plavix, follow-up in 4 to 6 weeks.  Flexible sigmoidoscopy was completed 12/23/2017 which found internal hemorrhoids status post banding, otherwise normal.  Recommended continue current medications, follow-up in 3 months.  Today he states he's doing well. Flex Sig went well. No further bleeding  since hemorrhoid banding (other than minor amounts for 2-3 days after banding). No ongoing constipation, has regular bristol 4 stools. Denies abdominal pain, N/V, melena, fever, chills, unintentional weight loss. Reviewed constipation and hemorrhoid measures. Denies chest pain, dyspnea, dizziness, lightheadedness, syncope, near syncope. Denies any other upper or lower GI symptoms.  Past Medical History:  Diagnosis Date  . Allergy   . Anxiety   . Cirrhosis ([place])   . Coronary artery disease    a. 03/2013 Cath/PCI: LM nl, LAD 44m (2.5x20 Promus Premier DES), LCX min irregs, RCA dom, 90p(3.0x20 Promus Premier DES), EF 55-65%.  . Essential hypertension, benign   . GERD (gastroesophageal reflux disease)   . Helicobacter pylori gastritis JUN 2016 EGD/Bx   PREVPAK BID FOR 14 DAYS  . Hepatitis    Hepatitis C treated   . Hiatal hernia   . History of kidney stones   . Mixed hyperlipidemia   . Skin cancer   . Substance abuse ([place])    alcoholic quit 8563  . Tobacco abuse     Past Surgical History:  Procedure Laterality Date  . ABDOMINAL AORTOGRAM W/LOWER EXTREMITY N/A 06/15/2017   Procedure: ABDOMINAL AORTOGRAM W/LOWER EXTREMITY;  Surgeon: Angelia Mould, MD;  Location: [facility];  Service: Cardiovascular;  Laterality: N/A;  Bilateral  . BIOPSY N/A 12/19/2014   Procedure: BIOPSY;  Surgeon: Danie Binder, MD;  Location: [facility];  Service: Endoscopy;  Laterality: N/A;  . COLONOSCOPY  June 2016   [facility]: with endoscopic mucosal resection. Path with tubular adenoma and focal high grade  dysplasia. Needs colonoscopy in 1 year.   . COLONOSCOPY WITH PROPOFOL N/A 12/19/2014   Dr. Oneida Alar: six simple adenomas and 3 hyperplastic polyps. Had flat mid-transverse colon polyp and referred to [facility] for endoscopic mucosal resection, which is scheduled for July 22  . CORONARY ANGIOPLASTY WITH STENT PLACEMENT  04/11/2013   LAD &  RCA     DR COOPER  . ESOPHAGOGASTRODUODENOSCOPY (EGD) WITH PROPOFOL  N/A 12/19/2014   Dr. Oneida Alar:  without varices, small hiatal hernia noted, moderate non-erosive gastritis and mild duodenitis.  POSITIVE H.PYLORI. Prescribed Prevpac.  Marland Kitchen FLEXIBLE SIGMOIDOSCOPY N/A 12/23/2017   Procedure: FLEXIBLE SIGMOIDOSCOPY;  Surgeon: Danie Binder, MD;  Location: [facility];  Service: Endoscopy;  Laterality: N/A;  1:45pm  . HAND RECONSTRUCTION Left   . HEMORRHOID BANDING N/A 12/23/2017   Procedure: Thayer Jew;  Surgeon: Danie Binder, MD;  Location: [facility];  Service: Endoscopy;  Laterality: N/A;  . LEFT HEART CATHETERIZATION WITH CORONARY ANGIOGRAM N/A 04/11/2013   Procedure: LEFT HEART CATHETERIZATION WITH CORONARY ANGIOGRAM;  Surgeon: Blane Ohara, MD;  Location: [facility];  Service: Cardiovascular;  Laterality: N/A;  . LEG TENDON SURGERY Right   . POLYPECTOMY N/A 12/19/2014   Procedure: POLYPECTOMY;  Surgeon: Danie Binder, MD;  Location: [facility];  Service: Endoscopy;  Laterality: N/A;    Current Outpatient Medications  Medication Sig Dispense Refill  . aspirin EC 81 MG tablet Take 81 mg by mouth at bedtime.     Marland Kitchen atorvastatin (LIPITOR) 40 MG tablet Take 1 tablet (40 mg total) by mouth daily. 90 tablet 3  . chlorthalidone (HYGROTON) 25 MG tablet Take 1 tablet (25 mg total) by mouth daily. Take 12.5 mg daily.(1/2 tablet) (Patient taking differently: Take 25 mg by mouth daily. ) 90 tablet 3  . Cholecalciferol (VITAMIN D3) 2000 units TABS Take 1 tablet by mouth daily.    . cloNIDine (CATAPRES) 0.1 MG tablet Take 1 tablet (0.1 mg total) 2 (two) times daily by mouth. 180 tablet 1  . clopidogrel (PLAVIX) 75 MG tablet TAKE 1 TABLET BY MOUTH ONCE DAILY. 30 tablet 0  . isosorbide mononitrate (IMDUR) 30 MG 24 hr tablet Take 0.5 tablets (15 mg total) by mouth daily. 45 tablet 3  . metoprolol succinate (TOPROL-XL) 50 MG 24 hr tablet Take 50 mg daily. ( 1 tablet) (Patient taking differently: Take 50 mg by mouth daily. ) 90 tablet 3  . Multiple Vitamin  (MULTIVITAMIN WITH MINERALS) TABS tablet Take 1 tablet by mouth daily.    . nitroGLYCERIN (NITROSTAT) 0.4 MG SL tablet Place 1 tablet (0.4 mg total) under the tongue every 5 (five) minutes as needed for chest pain. 25 tablet 3  . pantoprazole (PROTONIX) 40 MG tablet TAKE ONE TABLET BY MOUTH ONCE DAILY. 90 tablet 3  . potassium chloride SA (K-DUR,KLOR-CON) 20 MEQ tablet Take 1 tablet (20 mEq total) by mouth daily. 90 tablet 3   No current facility-administered medications for this visit.     Allergies as of 01/05/2018 - Review Complete 01/05/2018  Allergen Reaction Noted  . Lisinopril Swelling 08/18/2012  . Chantix [varenicline] Other (See Comments) 07/20/2017  . Neosporin [neomycin-bacitracin zn-polymyx] Rash 10/28/2013    Family History  Problem Relation Age of Onset  . Stroke Mother   . Aneurysm Mother   . Hypertension Mother   . Heart disease Father   . Hypertension Father   . Heart disease Brother   . Hypertension Brother   . Alcohol abuse Brother   .  Diabetes Paternal Aunt   . Colon cancer Neg Hx     Social History   Socioeconomic History  . Marital status: Soil scientist    Spouse name: Judie Petit  . Number of children: 2  . Years of education: 31  . Highest education level: Not on file  Occupational History  . Occupation: disabled    Comment: heart  Social Needs  . Financial resource strain: Not on file  . Food insecurity:    Worry: Not on file    Inability: Not on file  . Transportation needs:    Medical: Not on file    Non-medical: Not on file  Tobacco Use  . Smoking status: Current Every Day Smoker    Packs/day: 0.50    Years: 40.00    Pack years: 20.00    Types: Cigarettes    Start date: 07/14/1968  . Smokeless tobacco: Never Used  . Tobacco comment: 5-6 Cigarettes per day  Substance and Sexual Activity  . Alcohol use: Yes    Alcohol/week: 0.0 oz    Comment: As of 11/26/17: social - 1-2 beers a sitting x 1-2 times a month  . Drug use: No  . Sexual  activity: Not Currently  Lifestyle  . Physical activity:    Days per week: Not on file    Minutes per session: Not on file  . Stress: Not on file  Relationships  . Social connections:    Talks on phone: Not on file    Gets together: Not on file    Attends religious service: Not on file    Active member of club or organization: Not on file    Attends meetings of clubs or organizations: Not on file    Relationship status: Not on file  Other Topics Concern  . 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   Physical Exam: BP 139/80   Pulse 88   Temp (!) 97.3 F (36.3 C) (Oral)   Ht 5\' 9"  (1.753 m)   Wt 191 lb (86.6 kg)   BMI 28.21 kg/m  General:   Alert and oriented. Pleasant and cooperative. Well-nourished and well-developed.  Eyes:  Without icterus, sclera clear and conjunctiva pink.  Ears:  Normal auditory acuity. Cardiovascular:  S1, S2 present without murmurs appreciated. Extremities without clubbing or edema. Respiratory:  Clear to auscultation bilaterally. No wheezes, rales, or rhonchi. No distress.  Gastrointestinal:  +BS, soft, non-tender and non-distended. No HSM noted. No guarding or rebound. No masses appreciated.  Rectal:  Deferred  Musculoskalatal:  Symmetrical without gross deformities. Neurologic:  Alert and oriented x4;  grossly normal neurologically. Psych:  Alert and cooperative. Normal mood and affect. Heme/Lymph/Immune: No excessive bruising noted.    01/05/2018 11:22 AM   Disclaimer: This note was dictated with voice recognition software. Similar sounding words can inadvertently be transcribed and may not be corrected upon review.

## 2018-01-05 NOTE — Patient Instructions (Signed)
1. Continue taking your current medications. 2. Let us know if you need rectal suppositories or creams for flare of hemorrhoid symptoms. 3. As we discussed, avoid constipation (you can use over-the-counter stool softeners and other treatment agents). 4. Limit "toilet time" to 5 minutes. 5. Return for follow-up as needed. 6. Call us if you have any questions or concerns.  At [facility] we value your feedback. You may receive a survey about your visit today. Please share your experience as we strive to create trusting relationships with our patients to provide genuine, compassionate, quality care.  It was great to see you today!  I hope you have a wonderful summer!!!

## 2018-01-05 NOTE — Assessment & Plan Note (Signed)
History of colon polyps.  Colonoscopy recently completed and not currently due until 2021.  No red flag or warning signs or symptoms to indicate need for early interval colonoscopy.  Recommend he call for any concerns, follow-up as needed.

## 2018-01-05 NOTE — Assessment & Plan Note (Signed)
Internal hemorrhoids as likely source of bleeding.  Status post banding on flexible sigmoidoscopy.  No further bleeding since banding.  Discussed hemorrhoid prevention measures including avoiding constipation, diarrhea, limiting toilet time to 5 minutes.  Call if any recurrent bleeding and we can send in topical therapy.  Continue other current medications.  Use over-the-counter stool softeners and/or laxatives and/or fiber as needed for any constipation.  He can call our office for advice if needed.  Otherwise, follow-up as needed.

## 2018-01-05 NOTE — Assessment & Plan Note (Signed)
No further rectal bleeding since hemorrhoid banding.  Discussed for the patient to call us with any noted recurrent bleeding.  At that time we can send in topical treatments as needed.  Discussed avoiding constipation and limiting toilet time to 5 minutes.  Follow-up as needed.

## 2018-01-11 ENCOUNTER — Other Ambulatory Visit: Payer: Self-pay | Admitting: Cardiology

## 2018-01-21 ENCOUNTER — Other Ambulatory Visit: Payer: Self-pay | Admitting: Cardiology

## 2018-01-21 DIAGNOSIS — E782 Mixed hyperlipidemia: Secondary | ICD-10-CM

## 2018-02-03 ENCOUNTER — Other Ambulatory Visit: Payer: Self-pay | Admitting: Physician Assistant

## 2018-02-11 ENCOUNTER — Other Ambulatory Visit: Payer: Self-pay | Admitting: Cardiology

## 2018-02-11 DIAGNOSIS — E782 Mixed hyperlipidemia: Secondary | ICD-10-CM

## 2018-03-03 ENCOUNTER — Encounter: Payer: Self-pay | Admitting: Gastroenterology

## 2018-03-12 ENCOUNTER — Ambulatory Visit (INDEPENDENT_AMBULATORY_CARE_PROVIDER_SITE_OTHER): Payer: Medicaid Other | Admitting: Cardiology

## 2018-03-12 ENCOUNTER — Other Ambulatory Visit (HOSPITAL_COMMUNITY)
Admission: RE | Admit: 2018-03-12 | Discharge: 2018-03-12 | Disposition: A | Discharge status: Home / Self Care | Payer: Medicaid Other | Source: Ambulatory Visit | Attending: Cardiology | Admitting: Cardiology

## 2018-03-12 ENCOUNTER — Encounter: Payer: Self-pay | Admitting: Cardiology

## 2018-03-12 VITALS — BP 110/72 | HR 84 | Ht 69.0 in | Wt 190.8 lb

## 2018-03-12 DIAGNOSIS — E782 Mixed hyperlipidemia: Secondary | ICD-10-CM | POA: Insufficient documentation

## 2018-03-12 DIAGNOSIS — I2510 Atherosclerotic heart disease of native coronary artery without angina pectoris: Secondary | ICD-10-CM | POA: Insufficient documentation

## 2018-03-12 DIAGNOSIS — I10 Essential (primary) hypertension: Secondary | ICD-10-CM

## 2018-03-12 LAB — BASIC METABOLIC PANEL
ANION GAP: 8 (ref 5–15)
BUN: 14 mg/dL (ref 8–23)
CHLORIDE: 105 mmol/L (ref 98–111)
CO2: 28 mmol/L (ref 22–32)
Calcium: 9.6 mg/dL (ref 8.9–10.3)
Creatinine, Ser: 0.76 mg/dL (ref 0.61–1.24)
GFR calc Af Amer: >60 mL/min (ref >60)
GFR calc non Af Amer: >60 mL/min (ref >60)
Glucose, Bld: 92 mg/dL (ref 70–99)
POTASSIUM: 3.6 mmol/L (ref 3.5–5.1)
Sodium: 141 mmol/L (ref 135–145)

## 2018-03-12 LAB — MAGNESIUM: Magnesium: 2.2 mg/dL (ref 1.7–2.4)

## 2018-03-12 NOTE — Progress Notes (Signed)
Clinical Summary [name] is a 62 y.o.male seen today for follow up of the following medical problems.    1. CAD  - cath 04/11/13 showed significant LAD and RCA disease, s/p DES to both - echo 03/2013 with normal LVEF  - compliant w/ meds including ASA and plavix  - seen at [facility] for chestpain 08/2016. LHC at that time showed only mild CAD along with patent stents.   - last visit lowered imdur to 93m daily due to headaches, increased Toprol to 75mdaily. Has had some increased fatigue, poor sleep. No recent chest pain   - no recent chest pain.  - compliant with meds.   2. HL  - bad reaction to crestor, on only low dose atorvastatin 10 mg.  - 09/2016 TC 176 TG 204 HDL 46 LDL 89 - most recent labs with pcp  3. HTN  - we lowered TOprol to 5049maily last visit due to fatigue.  - home bps 110-130s/80s    4. History of GI bleed - followed by GI, from notes thought to be due to internal hemorroids.   5. PAD - followed by vascular - aniography 06/2017 without significant disease.  Past Medical History:  Diagnosis Date  . Allergy   . Anxiety   . Cirrhosis ([place] . Coronary artery disease    a. 03/2013 Cath/PCI: LM nl, LAD 37m40m5x20 Promus Premier DES), LCX min irregs, RCA dom, 90p(3.0x20 Promus Premier DES), EF 55-65%.  . Essential hypertension, benign   . GERD (gastroesophageal reflux disease)   . Helicobacter pylori gastritis JUN 2016 EGD/Bx   PREVPAK BID FOR 14 DAYS  . Hepatitis    Hepatitis C treated   . Hiatal hernia   . History of kidney stones   . Mixed hyperlipidemia   . Skin cancer   . 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 Lisinopril Swelling    Swelling neck  . Chantix [Varenicline] Other (See Comments)    Abdominal pain  . Neosporin [Neomycin-Bacitracin Zn-Polymyx] Rash    blistered     Current Outpatient Medications  Medication Sig Dispense Refill  . aspirin EC  81 MG tablet Take 81 mg by mouth at bedtime.     . atMarland Kitchenrvastatin (LIPITOR) 40 MG tablet Take 1 tablet (40 mg total) by mouth daily. 90 tablet 3  . chlorthalidone (HYGROTON) 25 MG tablet Take 1 tablet (25 mg total) by mouth daily. Take 12.5 mg daily.(1/2 tablet) (Patient taking differently: Take 25 mg by mouth daily. ) 90 tablet 3  . Cholecalciferol (VITAMIN D3) 2000 units TABS Take 1 tablet by mouth daily.    . cloNIDine (CATAPRES) 0.1 MG tablet Take 1 tablet (0.1 mg total) 2 (two) times daily by mouth. 180 tablet 1  . clopidogrel (PLAVIX) 75 MG tablet TAKE 1 TABLET BY MOUTH ONCE DAILY. 90 tablet 3  . isosorbide mononitrate (IMDUR) 30 MG 24 hr tablet Take 0.5 tablets (15 mg total) by mouth daily. 45 tablet 3  . isosorbide mononitrate (IMDUR) 30 MG 24 hr tablet TAKE 1/2 TABLET BY MOUTH ONCE DAILY. 7 tablet 0  . isosorbide mononitrate (IMDUR) 30 MG 24 hr tablet TAKE 1/2 TABLET BY MOUTH ONCE DAILY. 90 tablet 3  . metoprolol succinate (TOPROL-XL) 50 MG 24 hr tablet TAKE 1 TABLET BY MOUTH DAILY. 90 tablet 0  . Multiple Vitamin (MULTIVITAMIN WITH MINERALS) TABS tablet Take  1 tablet by mouth daily.    . nitroGLYCERIN (NITROSTAT) 0.4 MG SL tablet Place 1 tablet (0.4 mg total) under the tongue every 5 (five) minutes as needed for chest pain. 25 tablet 3  . pantoprazole (PROTONIX) 40 MG tablet TAKE ONE TABLET BY MOUTH ONCE DAILY. 90 tablet 3  . potassium chloride SA (K-DUR,KLOR-CON) 20 MEQ tablet Take 1 tablet (20 mEq total) by mouth daily. 90 tablet 3   No current facility-administered medications for this visit.      Past Surgical History:  Procedure Laterality Date  . ABDOMINAL AORTOGRAM W/LOWER EXTREMITY N/A 06/15/2017   Procedure: ABDOMINAL AORTOGRAM W/LOWER EXTREMITY;  Surgeon: Angelia Mould, MD;  Location: [place] [facility];  Service: Cardiovascular;  Laterality: N/A;  Bilateral  . BIOPSY N/A 12/19/2014   Procedure: BIOPSY;  Surgeon: Danie Binder, MD;  Location: [facility];  Service:  Endoscopy;  Laterality: N/A;  . COLONOSCOPY  June 2016   [facility]: with endoscopic mucosal resection. 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 Needs colonoscopy in 1 year.   . COLONOSCOPY WITH PROPOFOL N/A 12/19/2014   Dr. Oneida Alar: six simple adenomas and 3 hyperplastic polyps. Had flat mid-transverse colon polyp and referred to [facility] for endoscopic mucosal resection, which is scheduled for July 22  . CORONARY ANGIOPLASTY WITH STENT PLACEMENT  04/11/2013   LAD &  RCA     DR COOPER  . ESOPHAGOGASTRODUODENOSCOPY (EGD) WITH PROPOFOL N/A 12/19/2014   Dr. Oneida Alar:  without varices, small hiatal hernia noted, moderate non-erosive gastritis and mild duodenitis.  POSITIVE H.PYLORI. Prescribed Prevpac.  Marland Kitchen FLEXIBLE SIGMOIDOSCOPY N/A 12/23/2017   Procedure: FLEXIBLE SIGMOIDOSCOPY;  Surgeon: Danie Binder, MD;  Location: [facility];  Service: Endoscopy;  Laterality: N/A;  1:45pm  . HAND RECONSTRUCTION Left   . HEMORRHOID BANDING N/A 12/23/2017   Procedure: Thayer Jew;  Surgeon: Danie Binder, MD;  Location: [facility];  Service: Endoscopy;  Laterality: N/A;  . LEFT HEART CATHETERIZATION WITH CORONARY ANGIOGRAM N/A 04/11/2013   Procedure: LEFT HEART CATHETERIZATION WITH CORONARY ANGIOGRAM;  Surgeon: Blane Ohara, MD;  Location: [facility] CATH LAB;  Service: Cardiovascular;  Laterality: N/A;  . LEG TENDON SURGERY Right   . POLYPECTOMY N/A 12/19/2014   Procedure: POLYPECTOMY;  Surgeon: Danie Binder, MD;  Location: [facility];  Service: Endoscopy;  Laterality: N/A;     Allergies  Allergen Reactions  . Lisinopril Swelling    Swelling neck  . Chantix [Varenicline] Other (See Comments)    Abdominal pain  . Neosporin [Neomycin-Bacitracin Zn-Polymyx] Rash    blistered      Family History  Problem Relation Age of Onset  . Stroke Mother   . Aneurysm Mother   . Hypertension Mother   . Heart disease Father   . Hypertension Father   . Heart disease Brother   . Hypertension Brother    . Alcohol abuse Brother   . Diabetes Paternal Aunt   . Colon cancer Neg Hx      Social History Mr. Canipe reports that he has been smoking cigarettes. He started smoking about 49 years ago. He has a 20.00 pack-year smoking history. He has never used smokeless tobacco. Mr. [place] reports that he drinks alcohol.   Review of Systems CONSTITUTIONAL: No weight loss, fever, chills, weakness or fatigue.  HEENT: Eyes: No visual loss, blurred vision, double vision or yellow sclerae.No hearing loss, sneezing, congestion, runny nose or sore throat.  SKIN: No rash or itching.  CARDIOVASCULAR: per  hpi RESPIRATORY: No shortness of breath, cough or sputum.  GASTROINTESTINAL: No anorexia, nausea, vomiting or diarrhea. No abdominal pain or blood.  GENITOURINARY: No burning on urination, no polyuria NEUROLOGICAL: No headache, dizziness, syncope, paralysis, ataxia, numbness or tingling in the extremities. No change in bowel or bladder control.  MUSCULOSKELETAL: No muscle, back pain, joint pain or stiffness.  LYMPHATICS: No enlarged nodes. No history of splenectomy.  PSYCHIATRIC: No history of depression or anxiety.  ENDOCRINOLOGIC: No reports of sweating, cold or heat intolerance. No polyuria or polydipsia.  Marland Kitchen   Physical Examination Vitals:   03/12/18 1434  BP: 110/72  Pulse: 84  SpO2: 96%   Vitals:   03/12/18 1434  Weight: 190 lb 12.8 oz (86.5 kg)  Height: '5\' 9"'  (1.753 m)    Gen: resting comfortably, no acute distress HEENT: no scleral icterus, pupils equal round and reactive, no palptable cervical adenopathy,  CV: RRR, no m/r/g, no jvd Resp: Clear to auscultation bilaterally GI: abdomen is soft, non-tender, non-distended, normal bowel sounds, no hepatosplenomegaly MSK: extremities are warm, no edema.  Skin: warm, no rash Neuro:  no focal deficits Psych: appropriate affect   Diagnostic Studies 04/11/13 Cath PROCEDURAL FINDINGS  Hemodynamics:  AO 178/93  LV 177/18   Coronary angiography:  Coronary dominance: right  Left mainstem: Widely patent without obstructive disease  Left anterior descending (LAD): severe mid-vessel stenosis (80%) between the first and second septal perforators. The vessel reaches the LV apex and there is no other significant disease noted. Large D1 is patent.  Left circumflex (LCx): small vessel, supplies 2 OM branches. Mild irregularity noted.  Right coronary artery (RCA): large, dominant vessel. There is severe 90% proximal stenosis. Irregularity in the mid-vessel without significant stenosis. The PDA and PLA branches are both large without significant disease.  Left ventriculography: Left ventricular systolic function is normal, LVEF is estimated at 55-65%, there is no significant mitral regurgitation  PCI Note: Following the diagnostic procedure, the decision was made to proceed with PCI. The patient was loaded with plavix 600 mg. Weight-based bivalirudin was given for anticoagulation. I planned on treating the LAD and RCA, both of which have high-grade disease. Once a therapeutic ACT was achieved, a 6 [place] XB-LAD guide catheter was inserted. A cougar coronary guidewire was used to cross the lesion. The lesion was predilated with a 2.0 mm balloon. The lesion was then stented with a 2.5x20 mm Promus Premier drug-eluting stent. The stent was postdilated with a 2.75 mm noncompliant balloon. Following PCI, there was 0% residual stenosis and TIMI-3 flow. Attention was then turned to the RCA. A JR-4 guide was used. The same cougar guidewire was used to cross the lesion. The lesion was dilated with a 2.0 mm balloon and stented with a 3.0x20 mm Promus Premier DES. The stent was post-dilated to 18 atm with a 3.25 mm [place] balloon. Final angiography confirmed an excellent result. The patient tolerated the procedure well. There were no immediate procedural complications. A TR band was used for radial hemostasis. The patient was transferred to the  post catheterization recovery area for further monitoring.  PCI Data:  Lesion 1  Vessel - LAD/Segment - mid  Percent Stenosis (pre) 80  TIMI-flow 3  Stent 2.5x20 mm Promus Premier DES  Percent Stenosis (post) 0  TIMI-flow (post) 3  Lesion 2  Vessel - RCA/Segment - prox  Percent Stenosis (pre) 90  TIMI-flow 3  Stent 3.0x20 mm Promus Premier DES  Percent Stenosis (post) 0  TIMI-flow (post) 3  Final  Conclusions:  Severe 2 vessel CAD with successful PCI of the LAD and RCA  Normal LV function  Recommendations:  ASA and plavix for at least 12 months.   04/04/13 Echo LVEF 60-65%, no WMA, grade I diastolic dysfunction    12/1222 Exercise Nuclear Stress IMPRESSION: 1. No reversible ischemia or infarction. Submaximal stress test, thus diminishing the ability of this test to detect ischemia.  2. Normal left ventricular wall motion.  3. Left ventricular ejection fraction 78%  4. Low-risk stress test findings*. Consider Lexiscan in the future.   08/2016 [facility] cath Angiography: LVgram: not performed LCA: LAD stent patent, mild luminal irregularites, no significant obstructive disease RCA: proximal stent patent, large R dominant RCA,mild luminal irregularities in distal Marko Skalski  Impression:   Mild CAD with Patent Stents RCA and LAD Normal resting LVedp (14 mm)  Plan:  Follow-up with referring physician. Echocardiogram suggested to evaluate possible HFrEF as cause for symptoms      Assessment and Plan  1. CAD  - s/p DES to LAD and DES to RCA in setting of angina  - he has favored continuing plavix, which given his stent burden is reasonable  Recent cath 08/2016 at [facility] without significant CAD  - no recent symptoms, continue current meds  2. HTN  -at goal. He has stopped taking KCl on chlorthalidone, repeat BMET if stable K then can d/c KCl   3. HL  - he will continue statin, request labs from pcp   F/u 6 months   Arnoldo Lenis, M.D.,

## 2018-03-12 NOTE — Addendum Note (Signed)
Addended by: Barbarann Ehlers A on: 03/12/2018 03:24 PM   Modules accepted: Orders

## 2018-03-12 NOTE — Patient Instructions (Signed)
Your physician wants you to follow-up in: 6 months with Dr.BranchYou will receive a reminder letter in the mail two months in advance. If you don't receive a letter, please call our office to schedule the follow-up appointment.    Your physician recommends that you continue on your current medications as directed. Please refer to the Current Medication list given to you today.    If you need a refill on your cardiac medications before your next appointment, please call your pharmacy.    Your physician recommends that you return for lab work in:  Bmet, magnesium     No tests today      Thank you for choosing [place] !

## 2018-03-17 ENCOUNTER — Telehealth: Payer: Self-pay

## 2018-03-17 DIAGNOSIS — R6882 Decreased libido: Secondary | ICD-10-CM | POA: Diagnosis not present

## 2018-03-17 MED ORDER — POTASSIUM CHLORIDE CRYS ER 20 MEQ PO TBCR: 20.0000 meq | EXTENDED_RELEASE_TABLET | Freq: Every day | ORAL | 3 refills | Status: DC

## 2018-03-17 NOTE — Telephone Encounter (Signed)
Pt made aware. Copy to pcp. Sent potassium refill in to [facility].

## 2018-03-17 NOTE — Telephone Encounter (Signed)
-----   Message from Arnoldo Lenis, MD sent at 03/17/2018  2:37 PM EDT ----- Labs look ok, potasium is low normal and likely will become low over time. I would restart his potassium chloride 64mEq daily.   Zandra Abts MD

## 2018-04-15 ENCOUNTER — Other Ambulatory Visit: Payer: Self-pay | Admitting: Cardiology

## 2018-04-27 DIAGNOSIS — Z23 Encounter for immunization: Secondary | ICD-10-CM | POA: Diagnosis not present

## 2018-05-03 ENCOUNTER — Other Ambulatory Visit: Payer: Self-pay | Admitting: Cardiology

## 2018-05-06 ENCOUNTER — Encounter: Payer: Self-pay | Admitting: Podiatry

## 2018-05-06 ENCOUNTER — Ambulatory Visit (INDEPENDENT_AMBULATORY_CARE_PROVIDER_SITE_OTHER): Payer: Medicaid Other

## 2018-05-06 ENCOUNTER — Other Ambulatory Visit: Payer: Self-pay | Admitting: Podiatry

## 2018-05-06 ENCOUNTER — Ambulatory Visit: Payer: Medicaid Other | Admitting: Podiatry

## 2018-05-06 VITALS — BP 146/76 | HR 89 | Resp 16

## 2018-05-06 DIAGNOSIS — G5762 Lesion of plantar nerve, left lower limb: Secondary | ICD-10-CM | POA: Diagnosis not present

## 2018-05-06 DIAGNOSIS — M79674 Pain in right toe(s): Secondary | ICD-10-CM

## 2018-05-06 DIAGNOSIS — M79676 Pain in unspecified toe(s): Secondary | ICD-10-CM | POA: Diagnosis not present

## 2018-05-06 DIAGNOSIS — M79675 Pain in left toe(s): Secondary | ICD-10-CM

## 2018-05-06 DIAGNOSIS — D3610 Benign neoplasm of peripheral nerves and autonomic nervous system, unspecified: Secondary | ICD-10-CM

## 2018-05-06 DIAGNOSIS — M79672 Pain in left foot: Secondary | ICD-10-CM

## 2018-05-06 DIAGNOSIS — B351 Tinea unguium: Secondary | ICD-10-CM | POA: Diagnosis not present

## 2018-05-06 DIAGNOSIS — D689 Coagulation defect, unspecified: Secondary | ICD-10-CM

## 2018-05-06 MED ORDER — TRIAMCINOLONE ACETONIDE 10 MG/ML IJ SUSP
10.0000 mg | Freq: Once | INTRAMUSCULAR | Status: AC
  Administered 2018-05-06: 10 mg

## 2018-05-06 NOTE — Progress Notes (Signed)
   Subjective:    Patient ID: Duane Adams, male    DOB: 02-27-1956, 62 y.o.   MRN: [id number]  HPI    Review of Systems  All other systems reviewed and are negative.      Objective:   Physical Exam        Assessment & Plan:

## 2018-05-07 NOTE — Progress Notes (Signed)
Subjective:   Patient ID: Duane Adams, male   DOB: 62 y.o.   MRN: [id number]   HPI Patient presents stating that he is in poor health and has nails that he cannot cut get incurvated in the corners and is on blood thinner.  Also complains of shooting pain in the third interspace left foot that is been present for around 10 years and is gradually worsened over that time especially with shoe gear.  Patient smokes half pack per day and is not very active   Review of Systems  All other systems reviewed and are negative.       Objective:  Physical Exam  Constitutional: He appears well-developed and well-nourished.  Cardiovascular: Intact distal pulses.  Pulmonary/Chest: Effort normal.  Musculoskeletal: Normal range of motion.  Neurological: He is alert.  Skin: Skin is warm.  Nursing note and vitals reviewed.   Neurovascular status was found to be intact with muscle strength adequate.  Patient was noted to have thickened yellow nailbeds 1-5 both feet that are brittle and painful in the corners as they did get to the corners secondary to structure.  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  Patient also noted to have neuroma symptomatology left     Plan:  H&P initial x-ray reviewed and today I went ahead and did a injection of the third interspace with steroidal medication to try to reduce the neuroma symptoms and the inflammation associated with it and discussed possible neural lysis in future.  Debrided nailbeds 1-5 both feet with no iatrogenic bleeding and reappoint for routine care  X-rays indicate that there is no indications of stress fracture or advanced arthritis associated with condition

## 2018-05-10 DIAGNOSIS — R5383 Other fatigue: Secondary | ICD-10-CM | POA: Diagnosis not present

## 2018-05-10 DIAGNOSIS — R6882 Decreased libido: Secondary | ICD-10-CM | POA: Diagnosis not present

## 2018-05-10 DIAGNOSIS — E559 Vitamin D deficiency, unspecified: Secondary | ICD-10-CM | POA: Diagnosis not present

## 2018-05-10 DIAGNOSIS — I10 Essential (primary) hypertension: Secondary | ICD-10-CM | POA: Diagnosis not present

## 2018-06-29 IMAGING — US US ABDOMEN COMPLETE W/ ELASTOGRAPHY
2 series · 13 of 25 positions shown · non-contrast
Comparison: 12/15/2014

CLINICAL DATA: Chronic hepatitis-C without hepatic coma.



[Series 1: us abdomen complete w/ elastography · 0.18mm/px · 10 of 105 slices shown (1 of 2)]
[im 1/105]
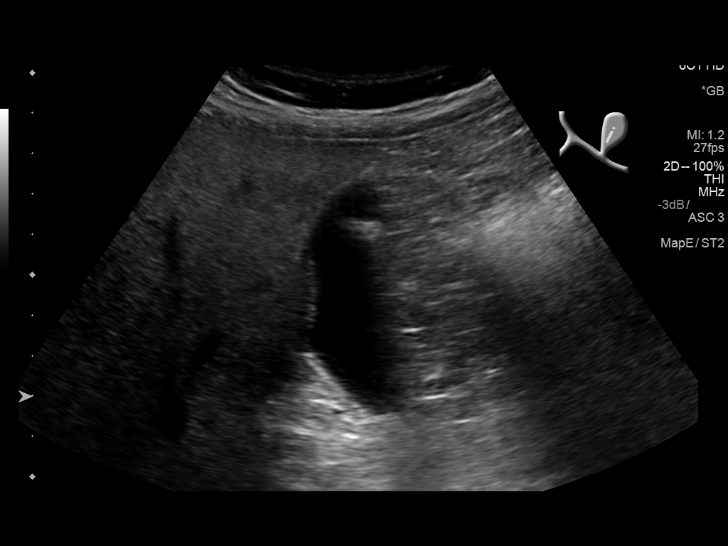
[im 12/105]
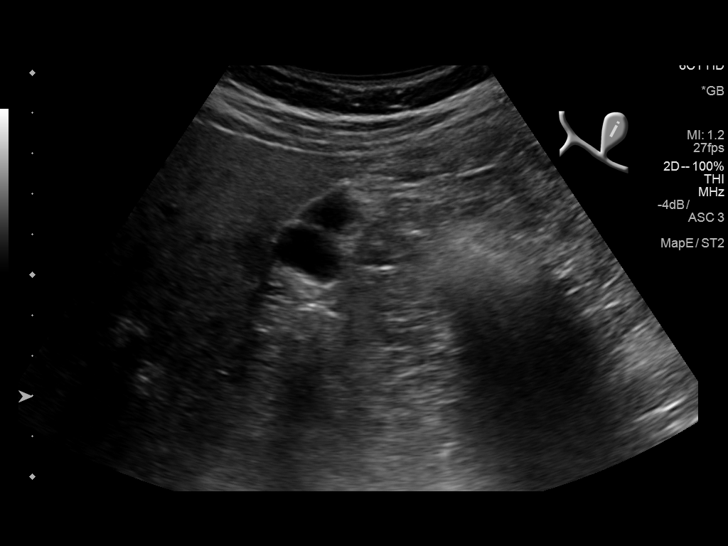
[im 24/105]
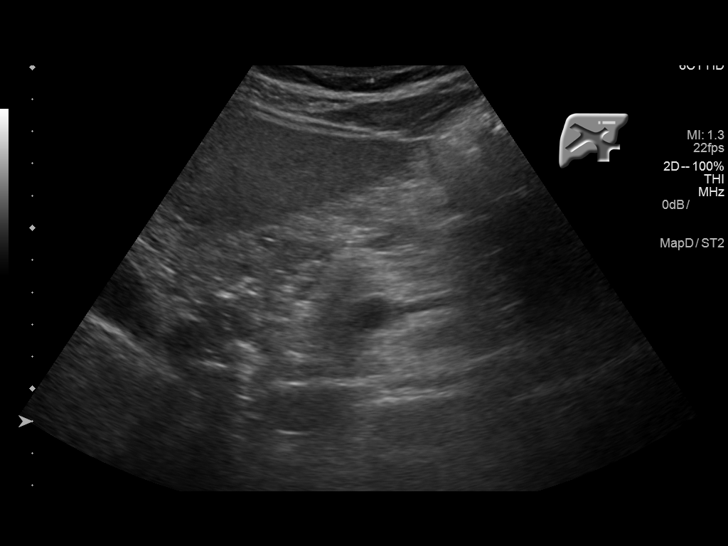
[im 35/105]
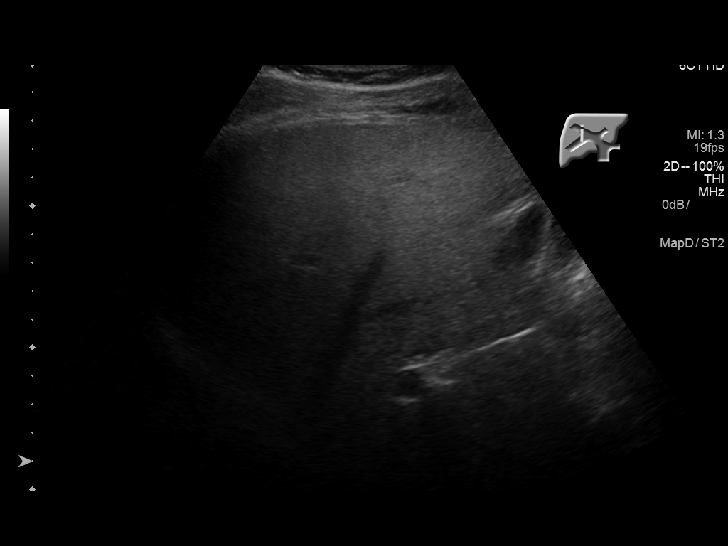
[im 47/105]
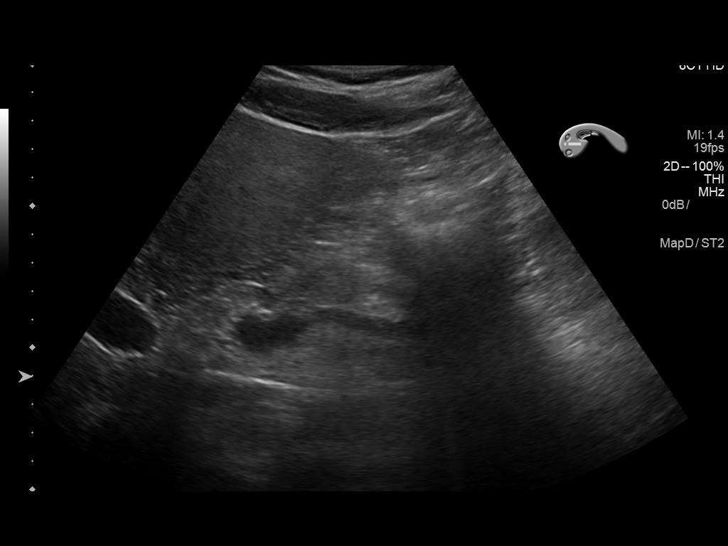
[im 58/105]
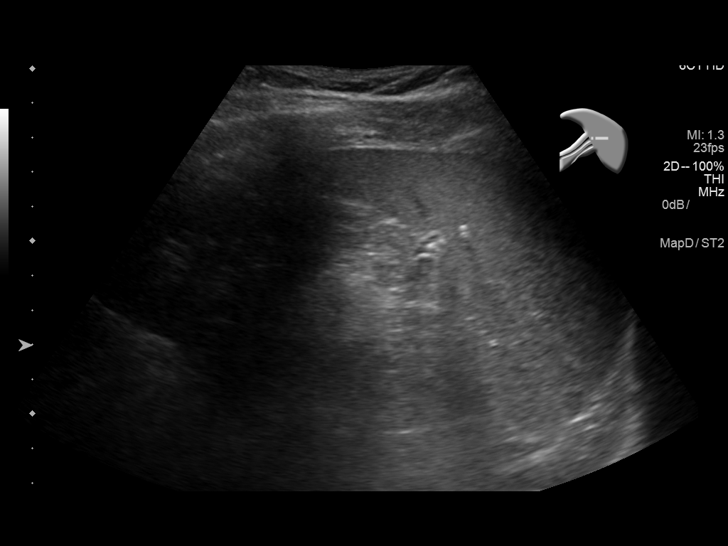
[im 70/105]
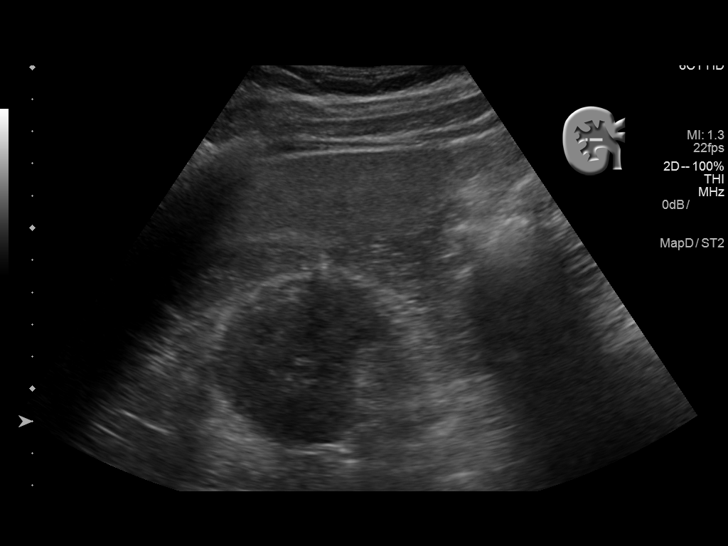
[im 81/105]
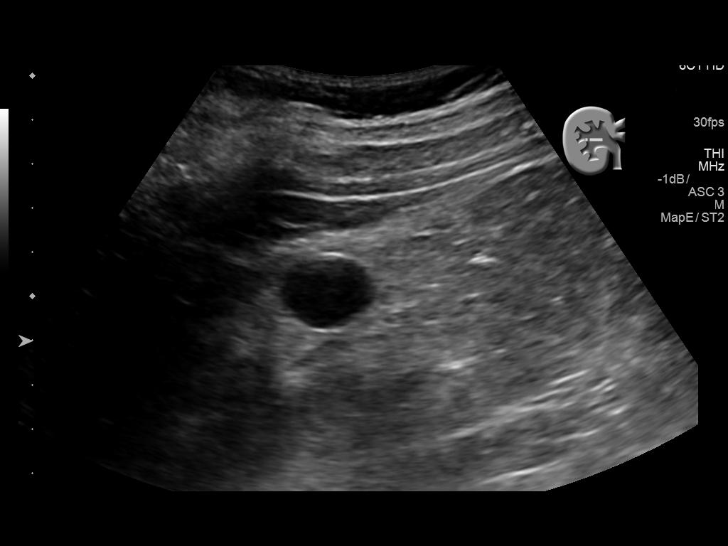
[im 93/105]
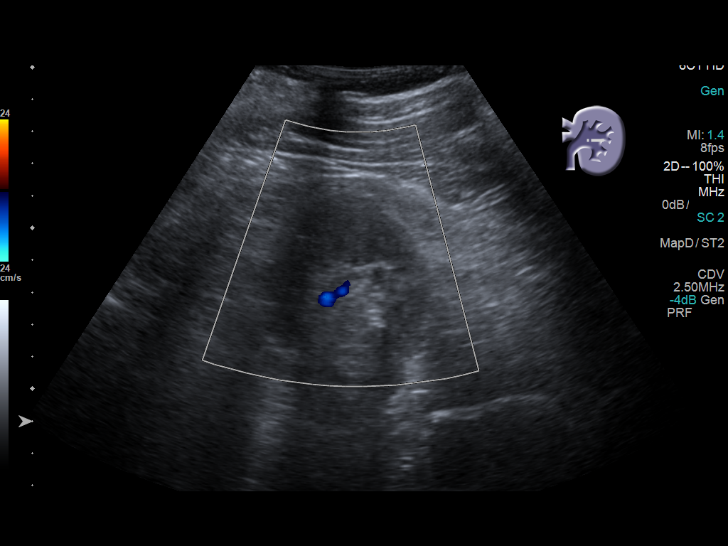
[im 105/105]
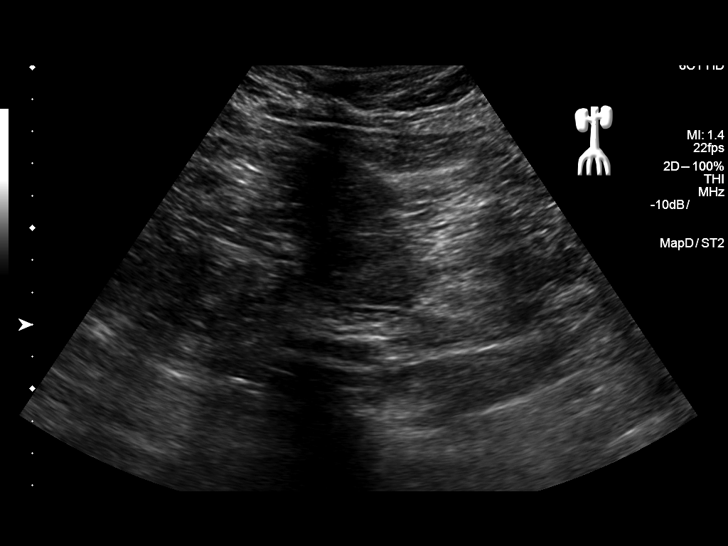

[Series 2001: us abdomen complete w/ elastography · 0.13mm/px · 3 of 30 slices shown (2 of 2)]
[im 6/30]
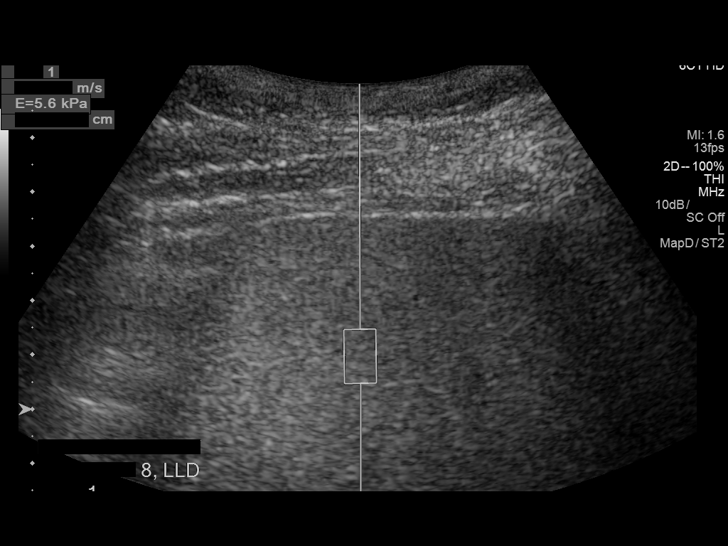
[im 18/30]
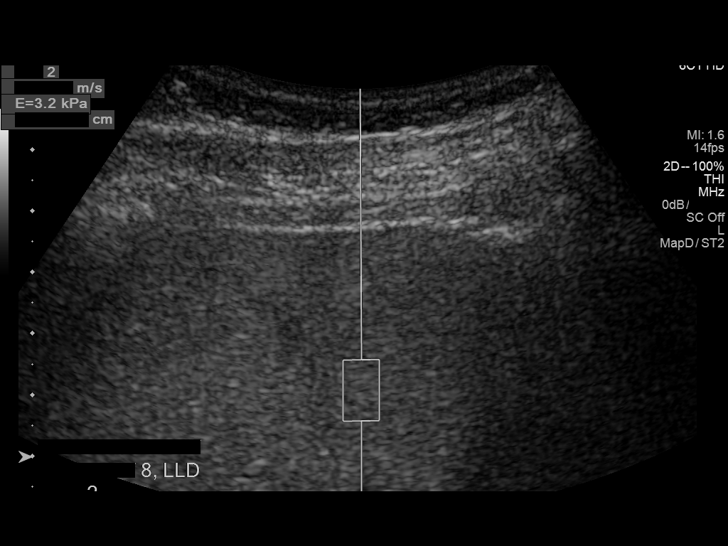
[im 30/30]
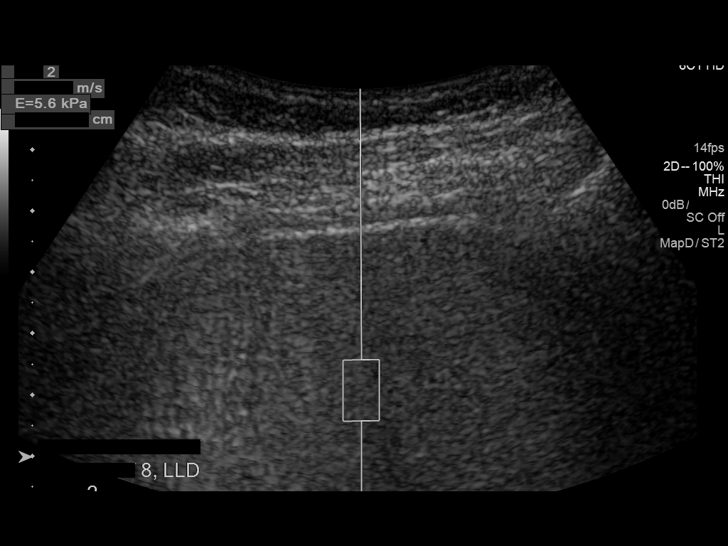

[13 of 25 positions shown; findings below may reference images not displayed]

FINDINGS: ULTRASOUND ABDOMEN

Gallbladder: No gallstones or wall thickening visualized. No
sonographic Murphy sign noted by sonographer.

Common bile duct: Diameter: 3 mm, within normal limits.

Liver: Diffusely increased echogenicity of the hepatic parenchyma,
consistent with hepatic steatosis. No focal mass lesion identified.

IVC: No abnormality visualized.

Pancreas: Visualized portion unremarkable.

Spleen: Size and appearance within normal limits.

Right Kidney: Length: 12.5 cm. Echogenicity within normal limits.
2.2 cm simple subcapsular cyst is seen in lower pole of right
kidney. No mass or hydronephrosis visualized.

Left Kidney: Length: 12.5 cm. Echogenicity within normal limits. No
mass or hydronephrosis visualized.

Abdominal aorta: No aneurysm visualized.

Other findings: None.

ULTRASOUND HEPATIC ELASTOGRAPHY

Device: Siemens Helix VTQ

Patient position: Left Lateral Decubitus

Transducer 6C1

Number of measurements: 10

Hepatic segment:  8

Median velocity:   1.75  m/sec

IQR:

IQR/Median velocity ratio:

Corresponding Metavir fibrosis score:  F2 + some F3

Risk of fibrosis: Moderate

Limitations of exam: None

Pertinent findings noted on other imaging exams:  None

Please note that abnormal shear wave velocities may also be
identified in clinical settings other than with hepatic fibrosis,
such as: acute hepatitis, elevated right heart and central venous
pressures including use of beta blockers, Kajo disease
(Citozi), infiltrative processes such as
mastocytosis/amyloidosis/infiltrative tumor, extrahepatic
cholestasis, in the post-prandial state, and liver transplantation.
Correlation with patient history, laboratory data, and clinical
condition recommended.
IMPRESSION: ULTRASOUND ABDOMEN:
No evidence of cholelithiasis, biliary dilatation, or other acute
findings.

Hepatic steatosis.  No hepatic mass visualized.

ULTRASOUND HEPATIC ELASTOGRAPHY:

Median hepatic shear wave velocity is calculated at 1.75 m/sec.

Corresponding Metavir fibrosis score is  F2 + some F3.

Risk of fibrosis is Moderate.

Follow-up: Additional testing appropriate

## 2018-07-15 ENCOUNTER — Other Ambulatory Visit: Payer: Self-pay | Admitting: Cardiology

## 2018-08-04 ENCOUNTER — Ambulatory Visit: Payer: Medicaid Other | Admitting: Podiatry

## 2018-08-04 DIAGNOSIS — M79674 Pain in right toe(s): Secondary | ICD-10-CM

## 2018-08-04 DIAGNOSIS — B351 Tinea unguium: Secondary | ICD-10-CM

## 2018-08-04 DIAGNOSIS — D492 Neoplasm of unspecified behavior of bone, soft tissue, and skin: Secondary | ICD-10-CM

## 2018-08-04 DIAGNOSIS — M79675 Pain in left toe(s): Secondary | ICD-10-CM

## 2018-08-04 DIAGNOSIS — D689 Coagulation defect, unspecified: Secondary | ICD-10-CM

## 2018-08-04 DIAGNOSIS — D3610 Benign neoplasm of peripheral nerves and autonomic nervous system, unspecified: Secondary | ICD-10-CM

## 2018-08-04 NOTE — Progress Notes (Signed)
Subjective:   Patient ID: Duane Adams, male   DOB: 63 y.o.   MRN: [id number]   HPI Patient presents for chronic nail trim with extreme pain in his nailbeds and extreme pain third interspace left stated killing him and he wants to have this taken care of   ROS      Objective:  Physical Exam  I noted neurovascular status to be intact with good DP pulses and light PT pulses but patient states he has had vascular studies done the last 8 months which were normal and patient admits he is been smoking a half a pack per day.  Patient has exquisite discomfort third interspace left with radiating shooting discomfort into the adjacent digits and thick nailbeds 1-5 both feet that are incurvated painful when pressed and he cannot cut     Assessment:  Patient with severe neuroma symptomatology left with positive Mulder sign and nail disease 1-5 both feet with pain     Plan:  H&P conditions discussed at great length.  He is going to go back to his vascular doctor for evaluation and I do think that neurectomy could be done after getting clearance from his family physician and he is going to do this.  I spent a great deal time going over that there is no guarantee with surgery and all possible complications and allowed him to read consent form going over alternative treatments and complications.  After extensive review and review with me he signed consent form and is given all preoperative instructions for surgery and we will get approval from vascular surgeon and then we can do this surgery.  Debrided nailbeds 1-5 today with no iatrogenic bleeding

## 2018-08-04 NOTE — Patient Instructions (Addendum)
Pre-Operative Instructions  Congratulations, you have decided to take an important step towards improving your quality of life.  You can be assured that the doctors and staff at [facility] will be with you every step of the way.  Here are some important things you should know:  1. Plan to be at the surgery center/hospital at least 1 (one) hour prior to your scheduled time, unless otherwise directed by the surgical center/hospital staff.  You must have a responsible adult accompany you, remain during the surgery and drive you home.  Make sure you have directions to the surgical center/hospital to ensure you arrive on time. 2. If you are having surgery at [facility] or [facility] hospitals, you will need a copy of your medical history and physical form from your family physician within one month prior to the date of surgery. We will give you a form for your primary physician to complete.  3. We make every effort to accommodate the date you request for surgery.  However, there are times where surgery dates or times have to be moved.  We will contact you as soon as possible if a change in schedule is required.   4. No aspirin/ibuprofen for one week before surgery.  If you are on aspirin, any non-steroidal anti-inflammatory medications (Mobic, Aleve, Ibuprofen) should not be taken seven (7) days prior to your surgery.  You make take Tylenol for pain prior to surgery.  5. Medications - If you are taking daily heart and blood pressure medications, seizure, reflux, allergy, asthma, anxiety, pain or diabetes medications, make sure you notify the surgery center/hospital before the day of surgery so they can tell you which medications you should take or avoid the day of surgery. 6. No food or drink after midnight the night before surgery unless directed otherwise by surgical center/hospital staff. 7. No alcoholic beverages 24-hours prior to surgery.  No smoking 24-hours prior or 24-hours after  surgery. 8. Wear loose pants or shorts. They should be loose enough to fit over bandages, boots, and casts. 9. Don't wear slip-on shoes. Sneakers are preferred. 10. Bring your boot with you to the surgery center/hospital.  Also bring crutches or a walker if your physician has prescribed it for you.  If you do not have this equipment, it will be provided for you after surgery. 11. If you have not been contacted by the surgery center/hospital by the day before your surgery, call to confirm the date and time of your surgery. 12. Leave-time from work may vary depending on the type of surgery you have.  Appropriate arrangements should be made prior to surgery with your employer. 13. Prescriptions will be provided immediately following surgery by your doctor.  Fill these as soon as possible after surgery and take the medication as directed. Pain medications will not be refilled on weekends and must be approved by the doctor. 14. Remove nail polish on the operative foot and avoid getting pedicures prior to surgery. 15. Wash the night before surgery.  The night before surgery wash the foot and leg well with water and the antibacterial soap provided. Be sure to pay special attention to beneath the toenails and in between the toes.  Wash for at least three (3) minutes. Rinse thoroughly with water and dry well with a towel.  Perform this wash unless told not to do so by your physician.  Enclosed: 1 Ice pack (please put in freezer the night before surgery)   1 Hibiclens skin cleaner     Pre-op instructions  If you have any questions regarding the instructions, please do not hesitate to call our office.  [place]: [address], [place], [place] [address] -- [phone]  [place]: [address] [place], [place] [address] -- [phone]  [place]: [address]  [place], [place] [address] -- [phone]  [place]: [address], [address], [place],  [address] -- [phone]  Website:  [email or web address] 

## 2018-09-29 ENCOUNTER — Other Ambulatory Visit: Payer: Self-pay | Admitting: Gastroenterology

## 2018-10-25 ENCOUNTER — Ambulatory Visit (INDEPENDENT_AMBULATORY_CARE_PROVIDER_SITE_OTHER): Payer: Medicaid Other | Admitting: Nurse Practitioner

## 2018-11-02 ENCOUNTER — Other Ambulatory Visit: Payer: Self-pay | Admitting: Cardiology

## 2018-11-04 ENCOUNTER — Ambulatory Visit: Payer: Medicaid Other | Admitting: Cardiology

## 2018-11-08 NOTE — Progress Notes (Signed)
REVIEWED-NO ADDITIONAL RECOMMENDATIONS. 

## 2018-11-09 NOTE — Progress Notes (Signed)
REVIEWED-NO ADDITIONAL RECOMMENDATIONS. 

## 2019-01-27 ENCOUNTER — Other Ambulatory Visit: Payer: Self-pay | Admitting: Nurse Practitioner

## 2019-01-27 DIAGNOSIS — I10 Essential (primary) hypertension: Secondary | ICD-10-CM | POA: Diagnosis not present

## 2019-01-27 DIAGNOSIS — E559 Vitamin D deficiency, unspecified: Secondary | ICD-10-CM | POA: Diagnosis not present

## 2019-01-27 DIAGNOSIS — E785 Hyperlipidemia, unspecified: Secondary | ICD-10-CM | POA: Diagnosis not present

## 2019-01-27 DIAGNOSIS — R195 Other fecal abnormalities: Secondary | ICD-10-CM | POA: Diagnosis not present

## 2019-01-28 ENCOUNTER — Other Ambulatory Visit (HOSPITAL_COMMUNITY): Payer: Self-pay | Admitting: Nurse Practitioner

## 2019-01-28 DIAGNOSIS — K7460 Unspecified cirrhosis of liver: Secondary | ICD-10-CM

## 2019-01-31 ENCOUNTER — Emergency Department (HOSPITAL_COMMUNITY): Payer: Medicaid Other

## 2019-01-31 ENCOUNTER — Encounter (HOSPITAL_COMMUNITY): Payer: Self-pay | Admitting: Emergency Medicine

## 2019-01-31 ENCOUNTER — Other Ambulatory Visit: Payer: Self-pay

## 2019-01-31 ENCOUNTER — Inpatient Hospital Stay (HOSPITAL_COMMUNITY)
Admission: EM | Admit: 2019-01-31 | Discharge: 2019-02-01 | DRG: 305 | Disposition: A | Discharge status: Home / Self Care | Payer: Medicaid Other | Attending: Internal Medicine | Admitting: Internal Medicine

## 2019-01-31 ENCOUNTER — Inpatient Hospital Stay (HOSPITAL_COMMUNITY): Payer: Medicaid Other

## 2019-01-31 ENCOUNTER — Telehealth: Payer: Self-pay

## 2019-01-31 DIAGNOSIS — Z823 Family history of stroke: Secondary | ICD-10-CM | POA: Diagnosis not present

## 2019-01-31 DIAGNOSIS — R42 Dizziness and giddiness: Secondary | ICD-10-CM

## 2019-01-31 DIAGNOSIS — E876 Hypokalemia: Secondary | ICD-10-CM | POA: Diagnosis present

## 2019-01-31 DIAGNOSIS — R471 Dysarthria and anarthria: Secondary | ICD-10-CM | POA: Diagnosis present

## 2019-01-31 DIAGNOSIS — Z955 Presence of coronary angioplasty implant and graft: Secondary | ICD-10-CM

## 2019-01-31 DIAGNOSIS — R479 Unspecified speech disturbances: Secondary | ICD-10-CM

## 2019-01-31 DIAGNOSIS — I6501 Occlusion and stenosis of right vertebral artery: Secondary | ICD-10-CM | POA: Diagnosis not present

## 2019-01-31 DIAGNOSIS — F17210 Nicotine dependence, cigarettes, uncomplicated: Secondary | ICD-10-CM | POA: Diagnosis present

## 2019-01-31 DIAGNOSIS — R079 Chest pain, unspecified: Secondary | ICD-10-CM | POA: Diagnosis not present

## 2019-01-31 DIAGNOSIS — R29700 NIHSS score 0: Secondary | ICD-10-CM | POA: Diagnosis present

## 2019-01-31 DIAGNOSIS — I6523 Occlusion and stenosis of bilateral carotid arteries: Secondary | ICD-10-CM | POA: Diagnosis present

## 2019-01-31 DIAGNOSIS — Z7982 Long term (current) use of aspirin: Secondary | ICD-10-CM

## 2019-01-31 DIAGNOSIS — Z8249 Family history of ischemic heart disease and other diseases of the circulatory system: Secondary | ICD-10-CM

## 2019-01-31 DIAGNOSIS — K219 Gastro-esophageal reflux disease without esophagitis: Secondary | ICD-10-CM | POA: Diagnosis present

## 2019-01-31 DIAGNOSIS — I2510 Atherosclerotic heart disease of native coronary artery without angina pectoris: Secondary | ICD-10-CM | POA: Diagnosis present

## 2019-01-31 DIAGNOSIS — Z79899 Other long term (current) drug therapy: Secondary | ICD-10-CM | POA: Diagnosis not present

## 2019-01-31 DIAGNOSIS — I169 Hypertensive crisis, unspecified: Principal | ICD-10-CM | POA: Diagnosis present

## 2019-01-31 DIAGNOSIS — I6381 Other cerebral infarction due to occlusion or stenosis of small artery: Secondary | ICD-10-CM | POA: Diagnosis not present

## 2019-01-31 DIAGNOSIS — I639 Cerebral infarction, unspecified: Secondary | ICD-10-CM

## 2019-01-31 DIAGNOSIS — R0789 Other chest pain: Secondary | ICD-10-CM | POA: Diagnosis not present

## 2019-01-31 DIAGNOSIS — I10 Essential (primary) hypertension: Secondary | ICD-10-CM | POA: Diagnosis not present

## 2019-01-31 HISTORY — DX: Cerebral infarction, unspecified: I63.9

## 2019-01-31 LAB — COMPREHENSIVE METABOLIC PANEL
ALT: 31 U/L (ref 0–44)
AST: 28 U/L (ref 15–41)
Albumin: 4.9 g/dL (ref 3.5–5.0)
Alkaline Phosphatase: 44 U/L (ref 38–126)
Anion gap: 14 (ref 5–15)
BUN: 14 mg/dL (ref 8–23)
CO2: 24 mmol/L (ref 22–32)
Calcium: 9.7 mg/dL (ref 8.9–10.3)
Chloride: 99 mmol/L (ref 98–111)
Creatinine, Ser: 0.71 mg/dL (ref 0.61–1.24)
GFR calc Af Amer: >60 mL/min (ref >60)
GFR calc non Af Amer: >60 mL/min (ref >60)
Glucose, Bld: 111 mg/dL — ABNORMAL HIGH (ref 70–99)
Potassium: 3.2 mmol/L — ABNORMAL LOW (ref 3.5–5.1)
Sodium: 137 mmol/L (ref 135–145)
Total Bilirubin: 0.5 mg/dL (ref 0.3–1.2)
Total Protein: 8.5 g/dL — ABNORMAL HIGH (ref 6.5–8.1)

## 2019-01-31 LAB — CBC
HCT: 48.2 % (ref 39.0–52.0)
Hemoglobin: 16.5 g/dL (ref 13.0–17.0)
MCH: 32.2 pg (ref 26.0–34.0)
MCHC: 34.2 g/dL (ref 30.0–36.0)
MCV: 94 fL (ref 80.0–100.0)
Platelets: 206 10*3/uL (ref 150–400)
RBC: 5.13 MIL/uL (ref 4.22–5.81)
RDW: 12.9 % (ref 11.5–15.5)
WBC: 8.2 10*3/uL (ref 4.0–10.5)
nRBC: 0 % (ref 0.0–0.2)

## 2019-01-31 LAB — TROPONIN I (HIGH SENSITIVITY)
Troponin I (High Sensitivity): 4 ng/L (ref <18)
Troponin I (High Sensitivity): 4 ng/L (ref <18)

## 2019-01-31 MED ORDER — CLOPIDOGREL BISULFATE 75 MG PO TABS
75.0000 mg | ORAL_TABLET | Freq: Every day | ORAL | Status: DC
  Filled 2019-01-31: qty 1

## 2019-01-31 MED ORDER — ACETAMINOPHEN 160 MG/5ML PO SOLN: 650.0000 mg | ORAL | Status: DC | PRN

## 2019-01-31 MED ORDER — NITROGLYCERIN 0.4 MG SL SUBL: 0.4000 mg | SUBLINGUAL_TABLET | SUBLINGUAL | Status: DC | PRN

## 2019-01-31 MED ORDER — PANTOPRAZOLE SODIUM 40 MG PO TBEC
40.0000 mg | DELAYED_RELEASE_TABLET | Freq: Every day | ORAL | Status: DC
  Administered 2019-01-31 – 2019-02-01 (×2): 40 mg via ORAL
  Filled 2019-01-31 (×2): qty 1

## 2019-01-31 MED ORDER — NICOTINE 7 MG/24HR TD PT24
7.0000 mg | MEDICATED_PATCH | Freq: Every day | TRANSDERMAL | Status: DC
  Administered 2019-01-31 – 2019-02-01 (×2): 7 mg via TRANSDERMAL
  Filled 2019-01-31 (×5): qty 1

## 2019-01-31 MED ORDER — STROKE: EARLY STAGES OF RECOVERY BOOK
Freq: Once | Status: AC
  Administered 2019-01-31: 18:00:00
  Filled 2019-01-31: qty 1

## 2019-01-31 MED ORDER — CLONIDINE HCL 0.1 MG PO TABS
0.1000 mg | ORAL_TABLET | Freq: Two times a day (BID) | ORAL | Status: DC
  Administered 2019-01-31 – 2019-02-01 (×2): 0.1 mg via ORAL
  Filled 2019-01-31 (×2): qty 1

## 2019-01-31 MED ORDER — ENOXAPARIN SODIUM 40 MG/0.4ML ~~LOC~~ SOLN: 40.0000 mg | SUBCUTANEOUS | Status: DC

## 2019-01-31 MED ORDER — ACETAMINOPHEN 650 MG RE SUPP: 650.0000 mg | RECTAL | Status: DC | PRN

## 2019-01-31 MED ORDER — SODIUM CHLORIDE 0.9 % IV SOLN
INTRAVENOUS | Status: AC
  Administered 2019-01-31: 18:00:00 via INTRAVENOUS

## 2019-01-31 MED ORDER — ISOSORBIDE MONONITRATE ER 30 MG PO TB24
15.0000 mg | ORAL_TABLET | Freq: Every day | ORAL | Status: DC
  Administered 2019-01-31: 18:00:00 15 mg via ORAL
  Filled 2019-01-31 (×2): qty 1

## 2019-01-31 MED ORDER — CHLORTHALIDONE 25 MG PO TABS
25.0000 mg | ORAL_TABLET | Freq: Every day | ORAL | Status: DC
  Filled 2019-01-31: qty 1

## 2019-01-31 MED ORDER — ATORVASTATIN CALCIUM 40 MG PO TABS
40.0000 mg | ORAL_TABLET | Freq: Every day | ORAL | Status: DC
  Administered 2019-01-31 – 2019-02-01 (×2): 40 mg via ORAL
  Filled 2019-01-31 (×2): qty 1

## 2019-01-31 MED ORDER — ASPIRIN EC 81 MG PO TBEC
81.0000 mg | DELAYED_RELEASE_TABLET | Freq: Every day | ORAL | Status: DC
  Administered 2019-01-31: 21:00:00 81 mg via ORAL
  Filled 2019-01-31: qty 1

## 2019-01-31 MED ORDER — HYDRALAZINE HCL 20 MG/ML IJ SOLN: 10.0000 mg | INTRAMUSCULAR | Status: DC | PRN

## 2019-01-31 MED ORDER — POTASSIUM CHLORIDE CRYS ER 20 MEQ PO TBCR
40.0000 meq | EXTENDED_RELEASE_TABLET | Freq: Once | ORAL | Status: AC
  Administered 2019-01-31: 40 meq via ORAL
  Filled 2019-01-31: qty 2

## 2019-01-31 MED ORDER — IOHEXOL 350 MG/ML SOLN
100.0000 mL | Freq: Once | INTRAVENOUS | Status: AC | PRN
  Administered 2019-01-31: 100 mL via INTRAVENOUS

## 2019-01-31 MED ORDER — METOPROLOL SUCCINATE ER 50 MG PO TB24
100.0000 mg | ORAL_TABLET | Freq: Every day | ORAL | Status: DC
  Filled 2019-01-31: qty 2

## 2019-01-31 MED ORDER — ACETAMINOPHEN 325 MG PO TABS: 650.0000 mg | ORAL_TABLET | ORAL | Status: DC | PRN

## 2019-01-31 NOTE — ED Provider Notes (Signed)
[facility] EMERGENCY DEPARTMENT Provider Note   CSN: [id number] Arrival date & time: 01/31/19  1003    History   Chief Complaint Chief Complaint  Patient presents with   Chest Pain   Dizziness    HPI Duane Adams is a 63 y.o. male.     HPI Patient presents with chest pain and dizziness.  States that he went to bed last night and had trouble sleeping.  States he woke up a few times feeling dizzy.  States he describes more as vertigo.  States he saw things shaking a little bit.  Does not feel like lightheadedness.  Had some dull chest pain.  States he sometimes get these symptoms when his blood pressure goes high.  States he checked his blood pressure and it was elevated.  However also last night during the dizziness he had a dull headache.  States that he had trouble speaking.  States he had trouble getting the words out.  States he also felt slowed mentally.  Has known coronary artery disease but 2 years ago had a reassuring cath.  No localizing numbness or weakness.  States his blood pressure is elevated he sometimes gets dizziness and his blood pressure goes up.  Discussed with Dr. Harl Bowie is staff and reportedly told to come in the ER. Past Medical History:  Diagnosis Date   Allergy    Anxiety    Cirrhosis ([place])    Coronary artery disease    a. 03/2013 Cath/PCI: LM nl, LAD 52m (2.5x20 Promus Premier DES), LCX min irregs, RCA dom, 90p(3.0x20 Promus Premier DES), EF 55-65%.   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   Tobacco abuse     Patient Active Problem List   Diagnosis Date Noted   Rectal bleeding 11/26/2017   Hemorrhoids 11/26/2017   History of colonic polyps 11/26/2017     PAD (peripheral artery disease) ([place]) 05/05/2017   Intermittent claudication ([place]) 05/05/2017   Hepatitis C virus infection resolved after antiviral drug therapy 05/05/2017   Cirrhosis of liver ([place]) 05/05/2017   Esophageal reflux 07/23/2015   Nicotine dependence, cigarettes, with other nicotine-induced disorders 07/23/2015   Hyperlipidemia 06/13/2015   Colon polyps 01/14/2015   Essential hypertension 04/12/2013   Coronary artery disease    Unspecified malignant neoplasm of skin of lower limb, including hip 03/16/2013    Past Surgical History:  Procedure Laterality Date   ABDOMINAL AORTOGRAM W/LOWER EXTREMITY N/A 06/15/2017   Procedure: ABDOMINAL AORTOGRAM W/LOWER EXTREMITY;  Surgeon: Angelia Mould, MD;  Location: [place] [facility];  Service: Cardiovascular;  Laterality: N/A;  Bilateral   BIOPSY N/A 12/19/2014   Procedure: BIOPSY;  Surgeon: Danie Binder, MD;  Location: [facility];  Service: Endoscopy;  Laterality: N/A;   COLONOSCOPY  June 2016   [facility]: with endoscopic mucosal resection. 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 Needs colonoscopy in 1 year.    COLONOSCOPY WITH PROPOFOL N/A 12/19/2014   Dr. Oneida Alar: six simple adenomas and 3 hyperplastic polyps. Had flat mid-transverse colon polyp and referred to [facility] for endoscopic mucosal resection, which is scheduled for July 22   CORONARY ANGIOPLASTY  WITH STENT PLACEMENT  04/11/2013   LAD &  RCA     DR COOPER   ESOPHAGOGASTRODUODENOSCOPY (EGD) WITH PROPOFOL N/A 12/19/2014   Dr. Oneida Alar:  without varices, small hiatal hernia noted, moderate non-erosive gastritis and mild duodenitis.  POSITIVE H.PYLORI. Prescribed Prevpac.   FLEXIBLE SIGMOIDOSCOPY N/A 12/23/2017   Procedure: FLEXIBLE SIGMOIDOSCOPY;  Surgeon: Danie Binder, MD;  Location: [facility];  Service: Endoscopy;  Laterality: N/A;  1:45pm   HAND RECONSTRUCTION Left    HEMORRHOID BANDING N/A 12/23/2017   Procedure: HEMORRHOID BANDING;   Surgeon: Danie Binder, MD;  Location: [facility];  Service: Endoscopy;  Laterality: N/A;   LEFT HEART CATHETERIZATION WITH CORONARY ANGIOGRAM N/A 04/11/2013   Procedure: LEFT HEART CATHETERIZATION WITH CORONARY ANGIOGRAM;  Surgeon: Blane Ohara, MD;  Location: [facility] CATH LAB;  Service: Cardiovascular;  Laterality: N/A;   LEG TENDON SURGERY Right    POLYPECTOMY N/A 12/19/2014   Procedure: POLYPECTOMY;  Surgeon: Danie Binder, MD;  Location: [facility];  Service: Endoscopy;  Laterality: N/A;        Home Medications    Prior to Admission medications   Medication Sig Start Date End Date Taking? Authorizing Provider  aspirin EC 81 MG tablet Take 81 mg by mouth at bedtime.    Yes [provider]  atorvastatin (LIPITOR) 40 MG tablet Take 1 tablet (40 mg total) by mouth daily. 06/08/17  Yes Raylene Everts, MD  chlorthalidone (HYGROTON) 25 MG tablet TAKE 1 TABLET BY MOUTH DAILY. 11/02/18  Yes BranchAlphonse Guild, MD  Cholecalciferol (VITAMIN D3) 2000 units TABS Take 1 tablet by mouth daily.   Yes [provider]  cloNIDine (CATAPRES) 0.1 MG tablet Take 1 tablet (0.1 mg total) 2 (two) times daily by mouth. 05/25/17  Yes Raylene Everts, MD  clopidogrel (PLAVIX) 75 MG tablet TAKE 1 TABLET BY MOUTH ONCE DAILY. 02/12/18  Yes BranchAlphonse Guild, MD  isosorbide mononitrate (IMDUR) 30 MG 24 hr tablet TAKE 1/2 TABLET BY MOUTH ONCE DAILY. 01/21/18  Yes Branch, Alphonse Guild, MD  metoprolol succinate (TOPROL-XL) 50 MG 24 hr tablet TAKE 1 TABLET BY MOUTH DAILY. Patient taking differently: 100 mg. TAKE 1 TABLET BY MOUTH DAILY. 07/15/18  Yes Branch, Alphonse Guild, MD  Multiple Vitamin (MULTIVITAMIN WITH MINERALS) TABS tablet Take 1 tablet by mouth daily.   Yes [provider]  nitroGLYCERIN (NITROSTAT) 0.4 MG SL tablet Place 1 tablet (0.4 mg total) under the tongue every 5 (five) minutes as needed for chest pain. 04/12/13  Yes Theora Gianotti, NP  pantoprazole (PROTONIX) 40 MG  tablet TAKE ONE TABLET BY MOUTH ONCE DAILY. 09/30/18  Yes Annitta Needs, NP  potassium chloride SA (K-DUR,KLOR-CON) 20 MEQ tablet Take 1 tablet (20 mEq total) by mouth daily. 03/17/18  Yes Branch, Alphonse Guild, MD  chlorthalidone (HYGROTON) 25 MG tablet Take 1 tablet (25 mg total) by mouth daily. Take 12.5 mg daily.(1/2 tablet) Patient taking differently: Take 25 mg by mouth daily.  03/30/17   Arnoldo Lenis, MD  chlorthalidone (HYGROTON) 25 MG tablet TAKE 1 TABLET BY MOUTH DAILY. 05/03/18   Arnoldo Lenis, MD  isosorbide mononitrate (IMDUR) 30 MG 24 hr tablet Take 0.5 tablets (15 mg total) by mouth daily. Patient not taking: Reported on 01/31/2019 11/27/16   Arnoldo Lenis, MD  isosorbide mononitrate (IMDUR) 30 MG 24 hr tablet TAKE 1/2 TABLET BY MOUTH ONCE DAILY. 02/12/18   Arnoldo Lenis, MD  testosterone cypionate (DEPOTESTOSTERONE CYPIONATE)  200 MG/ML injection INJECT 0.5 MLS INTO THE MUSCLE ONCE A WEEK 04/27/18   [provider]    Family History Family History  Problem Relation Age of Onset   Stroke Mother    Aneurysm Mother    Hypertension Mother    Heart disease Father    Hypertension Father    Heart disease Brother    Hypertension Brother    Alcohol abuse Brother    Diabetes Paternal Aunt    Colon cancer Neg Hx     Social History Social History   Tobacco Use   Smoking status: Current Every Day Smoker    Packs/day: 0.50    Years: 40.00    Pack years: 20.00    Types: Cigarettes    Start date: 07/14/1968   Smokeless tobacco: Never Used   Tobacco comment: 5-6 Cigarettes per day  Substance Use Topics   Alcohol use: Yes    Alcohol/week: 0.0 standard drinks    Comment: As of 11/26/17: social - 1-2 beers a sitting x 1-2 times a month   Drug use: No     Allergies   Lisinopril, Chantix [varenicline], and Neosporin [neomycin-bacitracin zn-polymyx]   Review of Systems Review of Systems  Constitutional: Negative for appetite change.  HENT:  Negative for congestion.   Respiratory: Negative for shortness of breath.   Cardiovascular: Positive for chest pain.  Gastrointestinal: Negative for abdominal pain.  Genitourinary: Negative for flank pain.  Musculoskeletal: Negative for back pain.  Skin: Negative for rash.  Neurological: Positive for dizziness, speech difficulty and headaches.  Psychiatric/Behavioral: Positive for confusion.     Physical Exam Updated Vital Signs BP (!) 197/87    Pulse 87    Temp 98.5 F (36.9 C) (Oral)    Resp 17    Ht 5\' 9"  (1.753 m)    Wt 83.9 kg    SpO2 98%    BMI 27.32 kg/m   Physical Exam Vitals signs and nursing note reviewed.  HENT:     Head: Atraumatic.  Eyes:     Pupils: Pupils are equal, round, and reactive to light.  Cardiovascular:     Rate and Rhythm: Normal rate and regular rhythm.     Heart sounds: Normal heart sounds.  Pulmonary:     Breath sounds: No wheezing, rhonchi or rales.  Abdominal:     Palpations: There is no mass.     Tenderness: There is no abdominal tenderness. There is no guarding.  Musculoskeletal:     Right lower leg: He exhibits no tenderness.     Left lower leg: He exhibits no tenderness.  Skin:    General: Skin is warm.     Capillary Refill: Capillary refill takes less than 2 seconds.  Neurological:     Mental Status: He is alert.     Comments: Nystagmus worse with gaze to right but also with gaze to left.  Has corrective saccade is with head rotation exam bilaterally.      ED Treatments / Results  Labs (all labs ordered are listed, but only abnormal results are displayed) Labs Reviewed  COMPREHENSIVE METABOLIC PANEL - Abnormal; Notable for the following components:      Result Value   Potassium 3.2 (*)    Glucose, Bld 111 (*)    Total Protein 8.5 (*)    All other components within normal limits  CBC  TROPONIN I (HIGH SENSITIVITY)  TROPONIN I (HIGH SENSITIVITY)    EKG EKG Interpretation  Date/Time:  Monday January 31 2019 10:34:58  EDT Ventricular Rate:  91 PR Interval:    QRS Duration: 90 QT Interval:  362 QTC Calculation: 446 R Axis:   71 Text Interpretation:  Sinus rhythm Consider left atrial enlargement Minimal ST depression, inferior leads Confirmed by Davonna Belling [phone]) on 01/31/2019 11:23:56 AM   Radiology Ct Angio Head W Or Wo Contrast  Result Date: 01/31/2019 CLINICAL DATA:  Focal neuro deficit, greater than 6 hours, stroke suspected. Chest pain and dizziness beginning at 2 a.m. Patient awoke with the symptoms. EXAM: CT ANGIOGRAPHY HEAD AND NECK TECHNIQUE: Multidetector CT imaging of the head and neck was performed using the standard protocol during bolus administration of intravenous contrast. Multiplanar CT image reconstructions and MIPs were obtained to evaluate the vascular anatomy. Carotid stenosis measurements (when applicable) are obtained utilizing NASCET criteria, using the distal internal carotid diameter as the denominator. CONTRAST:  135mL OMNIPAQUE IOHEXOL 350 MG/ML SOLN COMPARISON:  CT head without contrast 09/26/2009. Lacunar infarct involving the right thalamus genu of the internal capsule appears remote. A remote lacunar infarct is also present at the right lentiform nucleus. Mild subcortical white matter changes are noted bilaterally. No acute cortical abnormality is present. Basal ganglia are otherwise within normal limits. The ventricles are of normal size. No significant extraaxial fluid collection is present. FINDINGS: CT HEAD FINDINGS Brain: Nonhemorrhagic age indeterminate lacunar infarct is present in the right thalamus and internal capsule. A remote lacunar infarct is present adjacent to the right lentiform nucleus. Basal ganglia are otherwise intact. Acute or focal cortical abnormality is present. The ventricles are normal. No significant extraaxial fluid collection is present. Vascular: Atherosclerotic calcifications are present within the cavernous internal carotid arteries. There is no  hyperdense vessel. Skull: Calvarium is intact. No focal lytic or blastic lesions are present. Sinuses: The paranasal sinuses and mastoid air cells are clear. Orbits: The globes and orbits are within normal limits. Review of the MIP images confirms the above findings CTA NECK FINDINGS Aortic arch: Atherosclerotic changes are noted at the aortic arch the great vessel origins without a significant stenosis or focal aneurysm. Right carotid system: The right common carotid artery is within normal limits. There is no significant stenosis relative to the more distal vessel. Cervical right ICA is normal. Left carotid system: The left common carotid artery is within normal limits. Atherosclerotic calcifications are present at the carotid bifurcation without a significant stenosis relative to the more distal vessel. Cervical left ICA is normal. Vertebral arteries: The vertebral artery is the dominant vessel. Both vertebral arteries originate from the subclavian arteries. Atherosclerotic calcifications are present at the origin of the dominant right vertebral artery. Stenosis is less than 50%. Left vertebral artery is hypoplastic. 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 Skeleton: Multilevel endplate degenerative changes are present in the cervical spine uncovertebral spurring contributes to foraminal stenosis at C3-4, C5-6, and C6-7. No focal lytic or blastic lesions are present. Other neck: The soft tissues of the neck are within normal limits. No focal mucosal or submucosal lesions are present. There is no significant adenopathy. Salivary glands are normal. Thyroid is within normal limits. Upper chest: The lung apices are normal. Thoracic inlet is within normal limits. Review of the MIP images confirms the above findings CTA HEAD FINDINGS Anterior circulation: Atherosclerotic calcifications present within the cavernous internal carotid arteries bilaterally  without a significant stenosis relative to the more distal vessels. The ICA termini are normal. The anterior communicating  artery is patent. MCA bifurcations are within normal limits. The ACA and MCA branch vessels are normal. Posterior circulation: Calcification at the dural margin the right vertebral artery results in 50% stenosis. PICA origins are visualized and normal. Vertebrobasilar junction is within normal limits. The basilar artery is small. The right posterior cerebral artery originates from the basilar tip. The left posterior cerebral artery is of fetal type. There is mild irregularity within distal PCA vessels. Significant proximal stenosis or occlusion is present. Venous sinuses: The dural sinuses are patent. The straight sinus deep cerebral veins are intact. Cortical veins are normal. Anatomic variants: Fetal type left posterior cerebral artery. Prominent right posterior communicating artery. Review of the MIP images confirms the above findings IMPRESSION: 1. Atherosclerotic changes at the carotid bifurcations bilaterally without a significant stenosis relative to the more distal vessels. 2. 50% stenosis at the origin of the dominant right vertebral artery and at the dural margin. 3. 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 4. Mild distal small vessel disease without a significant proximal stenosis, aneurysm, or branch vessel occlusion within the circle-of-Willis. 5. Degenerative changes in the cervical spine as described. Electronically Signed   By: San Morelle M.D.   On: 01/31/2019 13:31   Ct Angio Neck W And/or Wo Contrast  Result Date: 01/31/2019 CLINICAL DATA:  Focal neuro deficit, greater than 6 hours, stroke suspected. Chest pain and dizziness beginning at 2 a.m. Patient awoke with the symptoms. EXAM: CT ANGIOGRAPHY HEAD AND NECK TECHNIQUE: Multidetector CT imaging of the head and neck was performed using the standard  protocol during bolus administration of intravenous contrast. Multiplanar CT image reconstructions and MIPs were obtained to evaluate the vascular anatomy. Carotid stenosis measurements (when applicable) are obtained utilizing NASCET criteria, using the distal internal carotid diameter as the denominator. CONTRAST:  143mL OMNIPAQUE IOHEXOL 350 MG/ML SOLN COMPARISON:  CT head without contrast 09/26/2009. Lacunar infarct involving the right thalamus genu of the internal capsule appears remote. A remote lacunar infarct is also present at the right lentiform nucleus. Mild subcortical white matter changes are noted bilaterally. No acute cortical abnormality is present. Basal ganglia are otherwise within normal limits. The ventricles are of normal size. No significant extraaxial fluid collection is present. FINDINGS: CT HEAD FINDINGS Brain: Nonhemorrhagic age indeterminate lacunar infarct is present in the right thalamus and internal capsule. A remote lacunar infarct is present adjacent to the right lentiform nucleus. Basal ganglia are otherwise intact. Acute or focal cortical abnormality is present. The ventricles are normal. No significant extraaxial fluid collection is present. Vascular: Atherosclerotic calcifications are present within the cavernous internal carotid arteries. There is no hyperdense vessel. Skull: Calvarium is intact. No focal lytic or blastic lesions are present. Sinuses: The paranasal sinuses and mastoid air cells are clear. Orbits: The globes and orbits are within normal limits. Review of the MIP images confirms the above findings CTA NECK FINDINGS Aortic arch: Atherosclerotic changes are noted at the aortic arch the great vessel origins without a significant stenosis or focal aneurysm. Right carotid system: The right common carotid artery is within normal limits. There is no significant stenosis relative to the more distal vessel. Cervical right ICA is normal. Left carotid system: The left common  carotid artery is within normal limits. Atherosclerotic calcifications are present at the carotid bifurcation without a significant stenosis relative to the more distal vessel. Cervical left ICA is normal. Vertebral arteries: The vertebral artery is the dominant vessel. Both vertebral  arteries originate from the subclavian arteries. Atherosclerotic calcifications are present at the origin of the dominant right vertebral artery. Stenosis is less than 50%. Left vertebral artery is hypoplastic. 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 Skeleton: Multilevel endplate degenerative changes are present in the cervical spine uncovertebral spurring contributes to foraminal stenosis at C3-4, C5-6, and C6-7. No focal lytic or blastic lesions are present. Other neck: The soft tissues of the neck are within normal limits. No focal mucosal or submucosal lesions are present. There is no significant adenopathy. Salivary glands are normal. Thyroid is within normal limits. Upper chest: The lung apices are normal. Thoracic inlet is within normal limits. Review of the MIP images confirms the above findings CTA HEAD FINDINGS Anterior circulation: Atherosclerotic calcifications present within the cavernous internal carotid arteries bilaterally without a significant stenosis relative to the more distal vessels. The ICA termini are normal. The anterior communicating artery is patent. MCA bifurcations are within normal limits. The ACA and MCA branch vessels are normal. Posterior circulation: Calcification at the dural margin the right vertebral artery results in 50% stenosis. PICA origins are visualized and normal. Vertebrobasilar junction is within normal limits. The basilar artery is small. The right posterior cerebral artery originates from the basilar tip. The left posterior cerebral artery is of fetal type. There is mild irregularity within distal PCA vessels. Significant  proximal stenosis or occlusion is present. Venous sinuses: The dural sinuses are patent. The straight sinus deep cerebral veins are intact. Cortical veins are normal. Anatomic variants: Fetal type left posterior cerebral artery. Prominent right posterior communicating artery. Review of the MIP images confirms the above findings IMPRESSION: 1. Atherosclerotic changes at the carotid bifurcations bilaterally without a significant stenosis relative to the more distal vessels. 2. 50% stenosis at the origin of the dominant right vertebral artery and at the dural margin. 3. 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 4. Mild distal small vessel disease without a significant proximal stenosis, aneurysm, or branch vessel occlusion within the circle-of-Willis. 5. Degenerative changes in the cervical spine as described. Electronically Signed   By: San Morelle M.D.   On: 01/31/2019 13:31   Dg Chest Portable 1 View  Result Date: 01/31/2019 CLINICAL DATA:  Central chest pain and dizziness since 2 a.m. today. EXAM: PORTABLE CHEST 1 VIEW COMPARISON:  12/28/2014. FINDINGS: Normal sized heart. Stable small amount of probable scarring at the left cardiac apex. Otherwise, clear lungs. Mild aortic tortuosity and atheromatous calcifications. Probable old posttraumatic changes at the right acromioclavicular joint. IMPRESSION: No acute abnormality. Electronically Signed   By: Claudie Revering M.D.   On: 01/31/2019 12:04    Procedures Procedures (including critical care time)  Medications Ordered in ED Medications  iohexol (OMNIPAQUE) 350 MG/ML injection 100 mL (100 mLs Intravenous Contrast Given 01/31/19 1243)     Initial Impression / Assessment and Plan / ED Course  I have reviewed the triage vital signs and the nursing notes.  Pertinent labs & imaging results that were available during my care of the patient were reviewed by me and considered in my medical  decision making (see chart for details).        Patient presented with chest pain.  EKG reassuring.  Enzymes negative x2.  Does have hypertension however.  However patient had dizziness and has some bilateral nystagmus.  Also had some difficulty speaking last night.  This does  worry me more for some sort of neurologic cause of the difficulty speaking and vertigo.  CTA rather reassuring overall.  No acute stroke.  Will admit for further work-up of a central cause of vertigo.  With the symptoms mild I do not think patient is a candidate for TPA at this time.  Had symptoms since 2 in the morning last night.  Final Clinical Impressions(s) / ED Diagnoses   Final diagnoses:  Hypertension, unspecified type  Dizziness  Difficulty speaking    ED Discharge Orders    None       Davonna Belling, MD 01/31/19 1347

## 2019-01-31 NOTE — H&P (Signed)
History and Physical    Duane Adams:[id number] DOB: 1955/10/28 DOA: 01/31/2019  PCP: Doree Albee, MD   Patient coming from: Home  Chief Complaint: Dizziness/chest pain  HPI: Duane Adams is a 63 y.o. male with medical history significant for severe hypertension with poor control, CAD with prior stent, GERD, and tobacco abuse who presented to the emergency department with complaints of substernal chest pain as well as some dizziness that began at approximately 2 AM.  He is unsure if this was actually the case, but does recall waking up later in the morning around 5 AM when he noticed that he was quite "swimmy headed" and would notice the room spinning when he moves his eyes around.  He has had similar kinds of symptoms previously when his blood pressures have been noted to be elevated.  He states that this is not quite unusual for him.  He additionally noted some substernal chest pain which is unusual.  He denies any headaches, other visual changes, or weakness in his limbs.  No sensory deficits noted.  He was noted to have some trouble with his speech and getting words out in general and felt as though he was slowed mentally.  He denies any speech slurring or facial droop.  Alarmed that his symptoms, he called his cardiologist Dr. Harl Bowie and his staff told the patient to come to the ED for further evaluation.  Patient states that he is compliant with his Plavix and aspirin on a daily basis along with his other medications.   ED Course: Vital signs are stable, but with elevated blood pressure readings noted.  Patient did not take his clonidine this morning.  Laboratory data only with potassium of 3.2 and otherwise unremarkable.  Chest x-ray with no acute findings.  CT of the head with some age-indeterminate CVA to the right thalamus and internal capsule region with CTA of the head and neck with findings of significant stenosis noted throughout.  EKG with no significant findings and  high-sensitivity troponin of 4 noted.  Review of Systems: All others reviewed and otherwise negative.  Past Medical History:  Diagnosis Date  . Allergy   . Anxiety   . Cirrhosis ([place])   . Coronary artery disease    a. 03/2013 Cath/PCI: LM nl, LAD 95m (2.5x20 Promus Premier DES), LCX min irregs, RCA dom, 90p(3.0x20 Promus Premier DES), EF 55-65%.  . CVA (cerebral vascular accident) ([place]) 01/31/2019  . Essential hypertension, benign   . GERD (gastroesophageal reflux disease)   . Helicobacter pylori gastritis JUN 2016 EGD/Bx   PREVPAK BID FOR 14 DAYS  . Hepatitis    Hepatitis C treated   . Hiatal hernia   . History of kidney stones   . Mixed hyperlipidemia   . Skin cancer   . Substance abuse ([place])    alcoholic quit 2440  . Tobacco abuse     Past Surgical History:  Procedure Laterality Date  . ABDOMINAL AORTOGRAM W/LOWER EXTREMITY N/A 06/15/2017   Procedure: ABDOMINAL AORTOGRAM W/LOWER EXTREMITY;  Surgeon: Angelia Mould, MD;  Location: [facility];  Service: Cardiovascular;  Laterality: N/A;  Bilateral  . BIOPSY N/A 12/19/2014   Procedure: BIOPSY;  Surgeon: Danie Binder, MD;  Location: [facility];  Service: Endoscopy;  Laterality: N/A;  . COLONOSCOPY  June 2016   [facility]: with endoscopic mucosal resection. 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 Needs colonoscopy in 1 year.   . COLONOSCOPY WITH PROPOFOL N/A 12/19/2014  Dr. Oneida Alar: six simple adenomas and 3 hyperplastic polyps. Had flat mid-transverse colon polyp and referred to [facility] for endoscopic mucosal resection, which is scheduled for July 22  . CORONARY ANGIOPLASTY WITH STENT PLACEMENT  04/11/2013   LAD &  RCA     DR COOPER  . ESOPHAGOGASTRODUODENOSCOPY (EGD) WITH PROPOFOL N/A 12/19/2014   Dr. Oneida Alar:  without varices, small hiatal hernia noted, moderate non-erosive gastritis and mild duodenitis.  POSITIVE H.PYLORI. Prescribed Prevpac.  Marland Kitchen FLEXIBLE SIGMOIDOSCOPY N/A 12/23/2017   Procedure: FLEXIBLE  SIGMOIDOSCOPY;  Surgeon: Danie Binder, MD;  Location: [facility];  Service: Endoscopy;  Laterality: N/A;  1:45pm  . HAND RECONSTRUCTION Left   . HEMORRHOID BANDING N/A 12/23/2017   Procedure: Thayer Jew;  Surgeon: Danie Binder, MD;  Location: [facility];  Service: Endoscopy;  Laterality: N/A;  . LEFT HEART CATHETERIZATION WITH CORONARY ANGIOGRAM N/A 04/11/2013   Procedure: LEFT HEART CATHETERIZATION WITH CORONARY ANGIOGRAM;  Surgeon: Blane Ohara, MD;  Location: [facility] CATH LAB;  Service: Cardiovascular;  Laterality: N/A;  . LEG TENDON SURGERY Right   . POLYPECTOMY N/A 12/19/2014   Procedure: POLYPECTOMY;  Surgeon: Danie Binder, MD;  Location: [facility];  Service: Endoscopy;  Laterality: N/A;     reports that he has been smoking cigarettes. He started smoking about 50 years ago. He has a 20.00 pack-year smoking history. He has never used smokeless tobacco. He reports current alcohol use. He reports that he does not use drugs.  Allergies  Allergen Reactions  . Lisinopril Swelling    Swelling neck  . Chantix [Varenicline] Other (See Comments)    Abdominal pain  . Neosporin [Neomycin-Bacitracin Zn-Polymyx] Rash    blistered    Family History  Problem Relation Age of Onset  . Stroke Mother   . Aneurysm Mother   . Hypertension Mother   . Heart disease Father   . Hypertension Father   . Heart disease Brother   . Hypertension Brother   . Alcohol abuse Brother   . Diabetes Paternal Aunt   . Colon cancer Neg Hx     Prior to Admission medications   Medication Sig Start Date End Date Taking? Authorizing Provider  aspirin EC 81 MG tablet Take 81 mg by mouth at bedtime.    Yes [provider]  atorvastatin (LIPITOR) 40 MG tablet Take 1 tablet (40 mg total) by mouth daily. 06/08/17  Yes Raylene Everts, MD  chlorthalidone (HYGROTON) 25 MG tablet TAKE 1 TABLET BY MOUTH DAILY. 11/02/18  Yes BranchAlphonse Guild, MD  Cholecalciferol (VITAMIN D3) 2000 units TABS Take 1  tablet by mouth daily.   Yes [provider]  cloNIDine (CATAPRES) 0.1 MG tablet Take 1 tablet (0.1 mg total) 2 (two) times daily by mouth. 05/25/17  Yes Raylene Everts, MD  clopidogrel (PLAVIX) 75 MG tablet TAKE 1 TABLET BY MOUTH ONCE DAILY. 02/12/18  Yes BranchAlphonse Guild, MD  isosorbide mononitrate (IMDUR) 30 MG 24 hr tablet TAKE 1/2 TABLET BY MOUTH ONCE DAILY. 01/21/18  Yes Branch, Alphonse Guild, MD  metoprolol succinate (TOPROL-XL) 50 MG 24 hr tablet TAKE 1 TABLET BY MOUTH DAILY. Patient taking differently: 100 mg. TAKE 1 TABLET BY MOUTH DAILY. 07/15/18  Yes Branch, Alphonse Guild, MD  Multiple Vitamin (MULTIVITAMIN WITH MINERALS) TABS tablet Take 1 tablet by mouth daily.   Yes [provider]  nitroGLYCERIN (NITROSTAT) 0.4 MG SL tablet Place 1 tablet (0.4 mg total) under the tongue every 5 (five) minutes as  needed for chest pain. 04/12/13  Yes Theora Gianotti, NP  pantoprazole (PROTONIX) 40 MG tablet TAKE ONE TABLET BY MOUTH ONCE DAILY. 09/30/18  Yes Annitta Needs, NP  potassium chloride SA (K-DUR,KLOR-CON) 20 MEQ tablet Take 1 tablet (20 mEq total) by mouth daily. 03/17/18  Yes Branch, Alphonse Guild, MD  chlorthalidone (HYGROTON) 25 MG tablet Take 1 tablet (25 mg total) by mouth daily. Take 12.5 mg daily.(1/2 tablet) Patient taking differently: Take 25 mg by mouth daily.  03/30/17   Arnoldo Lenis, MD  chlorthalidone (HYGROTON) 25 MG tablet TAKE 1 TABLET BY MOUTH DAILY. 05/03/18   Arnoldo Lenis, MD  isosorbide mononitrate (IMDUR) 30 MG 24 hr tablet Take 0.5 tablets (15 mg total) by mouth daily. Patient not taking: Reported on 01/31/2019 11/27/16   Arnoldo Lenis, MD  isosorbide mononitrate (IMDUR) 30 MG 24 hr tablet TAKE 1/2 TABLET BY MOUTH ONCE DAILY. 02/12/18   Arnoldo Lenis, MD  testosterone cypionate (DEPOTESTOSTERONE CYPIONATE) 200 MG/ML injection INJECT 0.5 MLS INTO THE MUSCLE ONCE A WEEK 04/27/18   [provider]    Physical Exam: Vitals:    01/31/19 1230 01/31/19 1300 01/31/19 1350 01/31/19 1353  BP: (!) 151/85 (!) 167/87 (!) 187/163   Pulse: 79 76  81  Resp:  20 16 18   Temp:      TempSrc:      SpO2: 95% 96%  98%  Weight:      Height:        Constitutional: NAD, calm, comfortable Vitals:   01/31/19 1230 01/31/19 1300 01/31/19 1350 01/31/19 1353  BP: (!) 151/85 (!) 167/87 (!) 187/163   Pulse: 79 76  81  Resp:  20 16 18   Temp:      TempSrc:      SpO2: 95% 96%  98%  Weight:      Height:       Eyes: lids and conjunctivae normal ENMT: Mucous membranes are moist.  Neck: normal, supple Respiratory: clear to auscultation bilaterally. Normal respiratory effort. No accessory muscle use.  Cardiovascular: Regular rate and rhythm, no murmurs. No extremity edema. Abdomen: no tenderness, no distention. Bowel sounds positive.  Musculoskeletal:  No joint deformity upper and lower extremities.   Skin: no rashes, lesions, ulcers.  Psychiatric: Normal judgment and insight. Alert and oriented x 3. Normal mood.  Neurologic: No significant focal deficits to light touch or motor strength in all 4 extremities.  Facial motions appear to be grossly intact as well as speech.  Labs on Admission: I have personally reviewed following labs and imaging studies  CBC: Recent Labs  Lab 01/31/19 1144  WBC 8.2  HGB 16.5  HCT 48.2  MCV 94.0  PLT 505   Basic Metabolic Panel: Recent Labs  Lab 01/31/19 1144  NA 137  K 3.2*  CL 99  CO2 24  GLUCOSE 111*  BUN 14  CREATININE 0.71  CALCIUM 9.7   GFR: Estimated Creatinine Clearance: 97 mL/min (by C-G formula based on SCr of 0.71 mg/dL). Liver Function Tests: Recent Labs  Lab 01/31/19 1144  AST 28  ALT 31  ALKPHOS 44  BILITOT 0.5  PROT 8.5*  ALBUMIN 4.9   No results for input(s): LIPASE, AMYLASE in the last 168 hours. No results for input(s): AMMONIA in the last 168 hours. Coagulation Profile: No results for input(s): INR, PROTIME in the last 168 hours. Cardiac Enzymes: No  results for input(s): CKTOTAL, CKMB, CKMBINDEX, TROPONINI in the last 168 hours. BNP (last 3  results) No results for input(s): PROBNP in the last 8760 hours. HbA1C: No results for input(s): HGBA1C in the last 72 hours. CBG: No results for input(s): GLUCAP in the last 168 hours. Lipid Profile: No results for input(s): CHOL, HDL, LDLCALC, TRIG, CHOLHDL, LDLDIRECT in the last 72 hours. Thyroid Function Tests: No results for input(s): TSH, T4TOTAL, FREET4, T3FREE, THYROIDAB in the last 72 hours. Anemia Panel: No results for input(s): VITAMINB12, FOLATE, FERRITIN, TIBC, IRON, RETICCTPCT in the last 72 hours. Urine analysis:    Component Value Date/Time   COLORURINE DARK YELLOW 05/05/2017 1135   APPEARANCEUR CLEAR 05/05/2017 1135   LABSPEC 1.028 05/05/2017 1135   PHURINE 7.0 05/05/2017 1135   GLUCOSEU NEGATIVE 05/05/2017 1135   HGBUR NEGATIVE 05/05/2017 1135   BILIRUBINUR NEGATIVE 10/30/2011 1209   KETONESUR TRACE (A) 05/05/2017 1135   PROTEINUR TRACE (A) 05/05/2017 1135   UROBILINOGEN 0.2 10/30/2011 1209   NITRITE NEGATIVE 05/05/2017 1135   LEUKOCYTESUR TRACE (A) 05/05/2017 1135    Radiological Exams on Admission: Ct Angio Head W Or Wo Contrast  Result Date: 01/31/2019 CLINICAL DATA:  Focal neuro deficit, greater than 6 hours, stroke suspected. Chest pain and dizziness beginning at 2 a.m. Patient awoke with the symptoms. EXAM: CT ANGIOGRAPHY HEAD AND NECK TECHNIQUE: Multidetector CT imaging of the head and neck was performed using the standard protocol during bolus administration of intravenous contrast. Multiplanar CT image reconstructions and MIPs were obtained to evaluate the vascular anatomy. Carotid stenosis measurements (when applicable) are obtained utilizing NASCET criteria, using the distal internal carotid diameter as the denominator. CONTRAST:  145mL OMNIPAQUE IOHEXOL 350 MG/ML SOLN COMPARISON:  CT head without contrast 09/26/2009. Lacunar infarct involving the right thalamus  genu of the internal capsule appears remote. A remote lacunar infarct is also present at the right lentiform nucleus. Mild subcortical white matter changes are noted bilaterally. No acute cortical abnormality is present. Basal ganglia are otherwise within normal limits. The ventricles are of normal size. No significant extraaxial fluid collection is present. FINDINGS: CT HEAD FINDINGS Brain: Nonhemorrhagic age indeterminate lacunar infarct is present in the right thalamus and internal capsule. A remote lacunar infarct is present adjacent to the right lentiform nucleus. Basal ganglia are otherwise intact. Acute or focal cortical abnormality is present. The ventricles are normal. No significant extraaxial fluid collection is present. Vascular: Atherosclerotic calcifications are present within the cavernous internal carotid arteries. There is no hyperdense vessel. Skull: Calvarium is intact. No focal lytic or blastic lesions are present. Sinuses: The paranasal sinuses and mastoid air cells are clear. Orbits: The globes and orbits are within normal limits. Review of the MIP images confirms the above findings CTA NECK FINDINGS Aortic arch: Atherosclerotic changes are noted at the aortic arch the great vessel origins without a significant stenosis or focal aneurysm. Right carotid system: The right common carotid artery is within normal limits. There is no significant stenosis relative to the more distal vessel. Cervical right ICA is normal. Left carotid system: The left common carotid artery is within normal limits. Atherosclerotic calcifications are present at the carotid bifurcation without a significant stenosis relative to the more distal vessel. Cervical left ICA is normal. Vertebral arteries: The vertebral artery is the dominant vessel. Both vertebral arteries originate from the subclavian arteries. Atherosclerotic calcifications are present at the origin of the dominant right vertebral artery. Stenosis is less than  50%. Left vertebral artery is hypoplastic. There is a 70+ % stenosis of the left subclavian artery just proximal to the left vertebral  artery takeoff. Skeleton: Multilevel endplate degenerative changes are present in the cervical spine uncovertebral spurring contributes to foraminal stenosis at C3-4, C5-6, and C6-7. No focal lytic or blastic lesions are present. Other neck: The soft tissues of the neck are within normal limits. No focal mucosal or submucosal lesions are present. There is no significant adenopathy. Salivary glands are normal. Thyroid is within normal limits. Upper chest: The lung apices are normal. Thoracic inlet is within normal limits. Review of the MIP images confirms the above findings CTA HEAD FINDINGS Anterior circulation: Atherosclerotic calcifications present within the cavernous internal carotid arteries bilaterally without a significant stenosis relative to the more distal vessels. The ICA termini are normal. The anterior communicating artery is patent. MCA bifurcations are within normal limits. The ACA and MCA branch vessels are normal. Posterior circulation: Calcification at the dural margin the right vertebral artery results in 50% stenosis. PICA origins are visualized and normal. Vertebrobasilar junction is within normal limits. The basilar artery is small. The right posterior cerebral artery originates from the basilar tip. The left posterior cerebral artery is of fetal type. There is mild irregularity within distal PCA vessels. Significant proximal stenosis or occlusion is present. Venous sinuses: The dural sinuses are patent. The straight sinus deep cerebral veins are intact. Cortical veins are normal. Anatomic variants: Fetal type left posterior cerebral artery. Prominent right posterior communicating artery. Review of the MIP images confirms the above findings IMPRESSION: 1. Atherosclerotic changes at the carotid bifurcations bilaterally without a significant stenosis relative to  the more distal vessels. 2. 50% stenosis at the origin of the dominant right vertebral artery and at the dural margin. 3. 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 4. Mild distal small vessel disease without a significant proximal stenosis, aneurysm, or branch vessel occlusion within the circle-of-Willis. 5. Degenerative changes in the cervical spine as described. Electronically Signed   By: San Morelle M.D.   On: 01/31/2019 13:31   Ct Angio Neck W And/or Wo Contrast  Result Date: 01/31/2019 CLINICAL DATA:  Focal neuro deficit, greater than 6 hours, stroke suspected. Chest pain and dizziness beginning at 2 a.m. Patient awoke with the symptoms. EXAM: CT ANGIOGRAPHY HEAD AND NECK TECHNIQUE: Multidetector CT imaging of the head and neck was performed using the standard protocol during bolus administration of intravenous contrast. Multiplanar CT image reconstructions and MIPs were obtained to evaluate the vascular anatomy. Carotid stenosis measurements (when applicable) are obtained utilizing NASCET criteria, using the distal internal carotid diameter as the denominator. CONTRAST:  174mL OMNIPAQUE IOHEXOL 350 MG/ML SOLN COMPARISON:  CT head without contrast 09/26/2009. Lacunar infarct involving the right thalamus genu of the internal capsule appears remote. A remote lacunar infarct is also present at the right lentiform nucleus. Mild subcortical white matter changes are noted bilaterally. No acute cortical abnormality is present. Basal ganglia are otherwise within normal limits. The ventricles are of normal size. No significant extraaxial fluid collection is present. FINDINGS: CT HEAD FINDINGS Brain: Nonhemorrhagic age indeterminate lacunar infarct is present in the right thalamus and internal capsule. A remote lacunar infarct is present adjacent to the right lentiform nucleus. Basal ganglia are otherwise intact. Acute or focal cortical  abnormality is present. The ventricles are normal. No significant extraaxial fluid collection is present. Vascular: Atherosclerotic calcifications are present within the cavernous internal carotid arteries. There is no hyperdense vessel. Skull: Calvarium is intact. No focal lytic or blastic lesions are present. Sinuses: The paranasal sinuses and  mastoid air cells are clear. Orbits: The globes and orbits are within normal limits. Review of the MIP images confirms the above findings CTA NECK FINDINGS Aortic arch: Atherosclerotic changes are noted at the aortic arch the great vessel origins without a significant stenosis or focal aneurysm. Right carotid system: The right common carotid artery is within normal limits. There is no significant stenosis relative to the more distal vessel. Cervical right ICA is normal. Left carotid system: The left common carotid artery is within normal limits. Atherosclerotic calcifications are present at the carotid bifurcation without a significant stenosis relative to the more distal vessel. Cervical left ICA is normal. Vertebral arteries: The vertebral artery is the dominant vessel. Both vertebral arteries originate from the subclavian arteries. Atherosclerotic calcifications are present at the origin of the dominant right vertebral artery. Stenosis is less than 50%. Left vertebral artery is hypoplastic. 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 Skeleton: Multilevel endplate degenerative changes are present in the cervical spine uncovertebral spurring contributes to foraminal stenosis at C3-4, C5-6, and C6-7. No focal lytic or blastic lesions are present. Other neck: The soft tissues of the neck are within normal limits. No focal mucosal or submucosal lesions are present. There is no significant adenopathy. Salivary glands are normal. Thyroid is within normal limits. Upper chest: The lung apices are normal. Thoracic  inlet is within normal limits. Review of the MIP images confirms the above findings CTA HEAD FINDINGS Anterior circulation: Atherosclerotic calcifications present within the cavernous internal carotid arteries bilaterally without a significant stenosis relative to the more distal vessels. The ICA termini are normal. The anterior communicating artery is patent. MCA bifurcations are within normal limits. The ACA and MCA branch vessels are normal. Posterior circulation: Calcification at the dural margin the right vertebral artery results in 50% stenosis. PICA origins are visualized and normal. Vertebrobasilar junction is within normal limits. The basilar artery is small. The right posterior cerebral artery originates from the basilar tip. The left posterior cerebral artery is of fetal type. There is mild irregularity within distal PCA vessels. Significant proximal stenosis or occlusion is present. Venous sinuses: The dural sinuses are patent. The straight sinus deep cerebral veins are intact. Cortical veins are normal. Anatomic variants: Fetal type left posterior cerebral artery. Prominent right posterior communicating artery. Review of the MIP images confirms the above findings IMPRESSION: 1. Atherosclerotic changes at the carotid bifurcations bilaterally without a significant stenosis relative to the more distal vessels. 2. 50% stenosis at the origin of the dominant right vertebral artery and at the dural margin. 3. 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 4. Mild distal small vessel disease without a significant proximal stenosis, aneurysm, or branch vessel occlusion within the circle-of-Willis. 5. Degenerative changes in the cervical spine as described. Electronically Signed   By: San Morelle M.D.   On: 01/31/2019 13:31   Dg Chest Portable 1 View  Result Date: 01/31/2019 CLINICAL DATA:  Central chest pain and dizziness since 2 a.m. today.  EXAM: PORTABLE CHEST 1 VIEW COMPARISON:  12/28/2014. FINDINGS: Normal sized heart. Stable small amount of probable scarring at the left cardiac apex. Otherwise, clear lungs. Mild aortic tortuosity and atheromatous calcifications. Probable old posttraumatic changes at the right acromioclavicular joint. IMPRESSION: No acute abnormality. Electronically Signed   By: Claudie Revering M.D.   On: 01/31/2019 12:04    EKG: Independently reviewed. SR 91bpm. Possible  inferior ST seg. Depressions.  Assessment/Plan Active Problems:   CVA (cerebral vascular accident) ([place])    Acute onset dizziness with mild dysarthria suspect for CVA -Patient may also have blood pressure related symptomatology -We will further delineate with brain MRI and consult to neurology as needed based on results -Maintain on current aspirin and Plavix as well as statin -Check lipids and A1c -2D echocardiogram -PT/OT evaluation as well as bedside swallow evaluation -Permissive hypertension -IV normal saline  Substernal chest pain-resolved -May be related to elevated blood pressure readings as he does not have early findings of ACS -We will recheck repeat troponins -Check 2D echocardiogram -Nitroglycerin as needed  Mild hypokalemia -Replete and recheck in a.m. along with mag  CAD with prior stent -Maintain on aspirin and Plavix as well as statin -Hold metoprolol and Imdur until MRI results return  Hypertension with elevated readings -Could be related to possible CVA -We will allow permissive hypertension for now until MRI results return -Hydralazine for significant elevations  GERD -PPI  Tobacco abuse -Counseled on cessation and offered nicotine patch   DVT prophylaxis: Lovenox Code Status: Full Family Communication:None at bedside  Disposition Plan:Admit for CVA evaluation Consults called:None Admission status: Inpatient, Tele   Kendyll Huettner Darleen Crocker DO [facility] Pager [phone]  If 7PM-7AM, please  contact night-coverage [email or web address] Password [email or web address]  01/31/2019, 2:18 PM

## 2019-01-31 NOTE — Telephone Encounter (Signed)
Awakened this am to feeling "swimmy headed", "can't get thoughts together", BP was 195/107, states he woke up twice in the night dizzy  Rechecked again 213/113   He is going to go to the ED now for evaluation   I will FYI Dr.Branch

## 2019-01-31 NOTE — ED Notes (Signed)
ED TO INPATIENT HANDOFF REPORT  ED Nurse Name and Phone #: Gaspar Bidding [phone]  S Name/Age/Gender Duane Adams 63 y.o. male Room/Bed: APA02/APA02  Code Status   Code Status: Full Code  Home/SNF/Other Home Patient oriented to: self, place, time and situation Is this baseline? Yes   Triage Complete: Triage complete  Chief Complaint hypertension chest tightness  Triage Note Patient complains of chest pain and dizziness that started this morning at 2am. States he woke up at 2 am with symptoms. Patient states he went to bed at 12am. He complains of tightness in center of chest.     Allergies Allergies  Allergen Reactions  . Lisinopril Swelling    Swelling neck  . Chantix [Varenicline] Other (See Comments)    Abdominal pain  . Neosporin [Neomycin-Bacitracin Zn-Polymyx] Rash    blistered    Level of Care/Admitting Diagnosis ED Disposition    ED Disposition Condition [facility] Area: [facility] [[id number]]  Level of Care: Telemetry [5]  Covid Evaluation: N/A  Diagnosis: CVA (cerebral vascular accident) [facility]) [[id number]]  Admitting Physician: Rodena Goldmann [[id number]]  Attending Physician: Heath Lark D [[id number]]  Estimated length of stay: past midnight tomorrow  Certification:: I certify this patient will need inpatient services for at least 2 midnights  PT Class (Do Not Modify): Inpatient [101]  PT Acc Code (Do Not Modify): Private [1]       B Medical/Surgery History Past Medical History:  Diagnosis Date  . Allergy   . Anxiety   . Cirrhosis ([place])   . Coronary artery disease    a. 03/2013 Cath/PCI: LM nl, LAD 77m (2.5x20 Promus Premier DES), LCX min irregs, RCA dom, 90p(3.0x20 Promus Premier DES), EF 55-65%.  . CVA (cerebral vascular accident) ([place]) 01/31/2019  . Essential hypertension, benign   . GERD (gastroesophageal reflux disease)   . Helicobacter pylori gastritis JUN 2016 EGD/Bx   PREVPAK BID FOR 14 DAYS  . Hepatitis    Hepatitis C treated    . Hiatal hernia   . History of kidney stones   . Mixed hyperlipidemia   . Skin cancer   . 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 Tobacco abuse    Past Surgical History:  Procedure Laterality Date  . ABDOMINAL AORTOGRAM W/LOWER EXTREMITY N/A 06/15/2017   Procedure: ABDOMINAL AORTOGRAM W/LOWER EXTREMITY;  Surgeon: Angelia Mould, MD;  Location: [facility];  Service: Cardiovascular;  Laterality: N/A;  Bilateral  . BIOPSY N/A 12/19/2014   Procedure: BIOPSY;  Surgeon: Danie Binder, MD;  Location: [facility];  Service: Endoscopy;  Laterality: N/A;  . COLONOSCOPY  June 2016   [facility]: with endoscopic mucosal resection. 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 Needs colonoscopy in 1 year.   . COLONOSCOPY WITH PROPOFOL N/A 12/19/2014   Dr. Oneida Alar: six simple adenomas and 3 hyperplastic polyps. Had flat mid-transverse colon polyp and referred to [facility] for endoscopic mucosal resection, which is scheduled for July 22  . CORONARY ANGIOPLASTY WITH STENT PLACEMENT  04/11/2013   LAD &  RCA     DR COOPER  . ESOPHAGOGASTRODUODENOSCOPY (EGD) WITH PROPOFOL N/A 12/19/2014   Dr. Oneida Alar:  without varices, small hiatal hernia noted, moderate non-erosive gastritis and mild duodenitis.  POSITIVE H.PYLORI. Prescribed Prevpac.  Marland Kitchen FLEXIBLE SIGMOIDOSCOPY N/A 12/23/2017   Procedure: FLEXIBLE SIGMOIDOSCOPY;  Surgeon: Danie Binder, MD;  Location: [facility];  Service: Endoscopy;  Laterality: N/A;  1:45pm  .  HAND RECONSTRUCTION Left   . HEMORRHOID BANDING N/A 12/23/2017   Procedure: [place] [facility];  Surgeon: Danie Binder, MD;  Location: [facility];  Service: Endoscopy;  Laterality: N/A;  . LEFT HEART CATHETERIZATION WITH CORONARY ANGIOGRAM N/A 04/11/2013   Procedure: LEFT HEART CATHETERIZATION WITH CORONARY ANGIOGRAM;  Surgeon: Blane Ohara, MD;  Location: [facility] CATH LAB;  Service: Cardiovascular;  Laterality: N/A;  . LEG TENDON SURGERY Right   . POLYPECTOMY N/A  12/19/2014   Procedure: POLYPECTOMY;  Surgeon: Danie Binder, MD;  Location: [facility];  Service: Endoscopy;  Laterality: N/A;     A IV Location/Drains/Wounds Patient Lines/Drains/Airways Status   Active Line/Drains/Airways    Name:   Placement date:   Placement time:   Site:   Days:   Peripheral IV 01/31/19 Right Antecubital   01/31/19    1052    Antecubital   less than 1          Intake/Output Last 24 hours No intake or output data in the 24 hours ending 01/31/19 1448  Labs/Imaging Results for orders placed or performed during the hospital encounter of 01/31/19 (from the past 48 hour(s))  Comprehensive metabolic panel     Status: Abnormal   Collection Time: 01/31/19 11:44 AM  Result Value Ref Range   Sodium 137 135 - 145 mmol/L   Potassium 3.2 (L) 3.5 - 5.1 mmol/L   Chloride 99 98 - 111 mmol/L   CO2 24 22 - 32 mmol/L   Glucose, Bld 111 (H) 70 - 99 mg/dL   BUN 14 8 - 23 mg/dL   Creatinine, Ser 0.71 0.61 - 1.24 mg/dL   Calcium 9.7 8.9 - 10.3 mg/dL   Total Protein 8.5 (H) 6.5 - 8.1 g/dL   Albumin 4.9 3.5 - 5.0 g/dL   AST 28 15 - 41 U/L   ALT 31 0 - 44 U/L   Alkaline Phosphatase 44 38 - 126 U/L   Total Bilirubin 0.5 0.3 - 1.2 mg/dL   GFR calc non Af Amer >60 >60 mL/min   GFR calc Af Amer >60 >60 mL/min   Anion gap 14 5 - 15    Comment: Performed at [facility], [address], [place], [place] [address]  Troponin I (High Sensitivity)     Status: None   Collection Time: 01/31/19 11:44 AM  Result Value Ref Range   Troponin I (High Sensitivity) 4 <18 ng/L    Comment: (NOTE) Elevated high sensitivity troponin I (hsTnI) values and significant  changes across serial measurements may suggest ACS but many other  chronic and acute conditions are known to elevate hsTnI results.  Refer to the "Links" section for chest pain algorithms and additional  guidance. Performed at [facility], [address], [place], [place] [address]   CBC     Status: None   Collection Time: 01/31/19  11:44 AM  Result Value Ref Range   WBC 8.2 4.0 - 10.5 K/uL   RBC 5.13 4.22 - 5.81 MIL/uL   Hemoglobin 16.5 13.0 - 17.0 g/dL   HCT 48.2 39.0 - 52.0 %   MCV 94.0 80.0 - 100.0 fL   MCH 32.2 26.0 - 34.0 pg   MCHC 34.2 30.0 - 36.0 g/dL   RDW 12.9 11.5 - 15.5 %   Platelets 206 150 - 400 K/uL   nRBC 0.0 0.0 - 0.2 %    Comment: Performed at [facility], [address] [place], [place] [address]  Troponin I (High Sensitivity)  Status: None   Collection Time: 01/31/19  1:23 PM  Result Value Ref Range   Troponin I (High Sensitivity) 4 <18 ng/L    Comment: (NOTE) Elevated high sensitivity troponin I (hsTnI) values and significant  changes across serial measurements may suggest ACS but many other  chronic and acute conditions are known to elevate hsTnI results.  Refer to the "Links" section for chest pain algorithms and additional  guidance. Performed at [facility], [address], [place], [place] [address]    Ct Angio Head W Or Wo Contrast  Result Date: 01/31/2019 CLINICAL DATA:  Focal neuro deficit, greater than 6 hours, stroke suspected. Chest pain and dizziness beginning at 2 a.m. Patient awoke with the symptoms. EXAM: CT ANGIOGRAPHY HEAD AND NECK TECHNIQUE: Multidetector CT imaging of the head and neck was performed using the standard protocol during bolus administration of intravenous contrast. Multiplanar CT image reconstructions and MIPs were obtained to evaluate the vascular anatomy. Carotid stenosis measurements (when applicable) are obtained utilizing NASCET criteria, using the distal internal carotid diameter as the denominator. CONTRAST:  148mL OMNIPAQUE IOHEXOL 350 MG/ML SOLN COMPARISON:  CT head without contrast 09/26/2009. Lacunar infarct involving the right thalamus genu of the internal capsule appears remote. A remote lacunar infarct is also present at the right lentiform nucleus. Mild subcortical white matter changes are noted bilaterally. No acute cortical abnormality is  present. Basal ganglia are otherwise within normal limits. The ventricles are of normal size. No significant extraaxial fluid collection is present. FINDINGS: CT HEAD FINDINGS Brain: Nonhemorrhagic age indeterminate lacunar infarct is present in the right thalamus and internal capsule. A remote lacunar infarct is present adjacent to the right lentiform nucleus. Basal ganglia are otherwise intact. Acute or focal cortical abnormality is present. The ventricles are normal. No significant extraaxial fluid collection is present. Vascular: Atherosclerotic calcifications are present within the cavernous internal carotid arteries. There is no hyperdense vessel. Skull: Calvarium is intact. No focal lytic or blastic lesions are present. Sinuses: The paranasal sinuses and mastoid air cells are clear. Orbits: The globes and orbits are within normal limits. Review of the MIP images confirms the above findings CTA NECK FINDINGS Aortic arch: Atherosclerotic changes are noted at the aortic arch the great vessel origins without a significant stenosis or focal aneurysm. Right carotid system: The right common carotid artery is within normal limits. There is no significant stenosis relative to the more distal vessel. Cervical right ICA is normal. Left carotid system: The left common carotid artery is within normal limits. Atherosclerotic calcifications are present at the carotid bifurcation without a significant stenosis relative to the more distal vessel. Cervical left ICA is normal. Vertebral arteries: The vertebral artery is the dominant vessel. Both vertebral arteries originate from the subclavian arteries. Atherosclerotic calcifications are present at the origin of the dominant right vertebral artery. Stenosis is less than 50%. Left vertebral artery is hypoplastic. 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 Skeleton: Multilevel endplate degenerative changes are  present in the cervical spine uncovertebral spurring contributes to foraminal stenosis at C3-4, C5-6, and C6-7. No focal lytic or blastic lesions are present. Other neck: The soft tissues of the neck are within normal limits. No focal mucosal or submucosal lesions are present. There is no significant adenopathy. Salivary glands are normal. Thyroid is within normal limits. Upper chest: The lung apices are normal. Thoracic inlet is within normal limits. Review of the MIP images confirms the above  findings CTA HEAD FINDINGS Anterior circulation: Atherosclerotic calcifications present within the cavernous internal carotid arteries bilaterally without a significant stenosis relative to the more distal vessels. The ICA termini are normal. The anterior communicating artery is patent. MCA bifurcations are within normal limits. The ACA and MCA branch vessels are normal. Posterior circulation: Calcification at the dural margin the right vertebral artery results in 50% stenosis. PICA origins are visualized and normal. Vertebrobasilar junction is within normal limits. The basilar artery is small. The right posterior cerebral artery originates from the basilar tip. The left posterior cerebral artery is of fetal type. There is mild irregularity within distal PCA vessels. Significant proximal stenosis or occlusion is present. Venous sinuses: The dural sinuses are patent. The straight sinus deep cerebral veins are intact. Cortical veins are normal. Anatomic variants: Fetal type left posterior cerebral artery. Prominent right posterior communicating artery. Review of the MIP images confirms the above findings IMPRESSION: 1. Atherosclerotic changes at the carotid bifurcations bilaterally without a significant stenosis relative to the more distal vessels. 2. 50% stenosis at the origin of the dominant right vertebral artery and at the dural margin. 3. 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 4. Mild distal small vessel disease without a significant proximal stenosis, aneurysm, or branch vessel occlusion within the circle-of-Willis. 5. Degenerative changes in the cervical spine as described. Electronically Signed   By: San Morelle M.D.   On: 01/31/2019 13:31   Ct Angio Neck W And/or Wo Contrast  Result Date: 01/31/2019 CLINICAL DATA:  Focal neuro deficit, greater than 6 hours, stroke suspected. Chest pain and dizziness beginning at 2 a.m. Patient awoke with the symptoms. EXAM: CT ANGIOGRAPHY HEAD AND NECK TECHNIQUE: Multidetector CT imaging of the head and neck was performed using the standard protocol during bolus administration of intravenous contrast. Multiplanar CT image reconstructions and MIPs were obtained to evaluate the vascular anatomy. Carotid stenosis measurements (when applicable) are obtained utilizing NASCET criteria, using the distal internal carotid diameter as the denominator. CONTRAST:  138mL OMNIPAQUE IOHEXOL 350 MG/ML SOLN COMPARISON:  CT head without contrast 09/26/2009. Lacunar infarct involving the right thalamus genu of the internal capsule appears remote. A remote lacunar infarct is also present at the right lentiform nucleus. Mild subcortical white matter changes are noted bilaterally. No acute cortical abnormality is present. Basal ganglia are otherwise within normal limits. The ventricles are of normal size. No significant extraaxial fluid collection is present. FINDINGS: CT HEAD FINDINGS Brain: Nonhemorrhagic age indeterminate lacunar infarct is present in the right thalamus and internal capsule. A remote lacunar infarct is present adjacent to the right lentiform nucleus. Basal ganglia are otherwise intact. Acute or focal cortical abnormality is present. The ventricles are normal. No significant extraaxial fluid collection is present. Vascular: Atherosclerotic calcifications are present within the cavernous internal carotid arteries.  There is no hyperdense vessel. Skull: Calvarium is intact. No focal lytic or blastic lesions are present. Sinuses: The paranasal sinuses and mastoid air cells are clear. Orbits: The globes and orbits are within normal limits. Review of the MIP images confirms the above findings CTA NECK FINDINGS Aortic arch: Atherosclerotic changes are noted at the aortic arch the great vessel origins without a significant stenosis or focal aneurysm. Right carotid system: The right common carotid artery is within normal limits. There is no significant stenosis relative to the more distal vessel. Cervical right ICA is normal. Left carotid system: The left common carotid artery is within normal limits.  Atherosclerotic calcifications are present at the carotid bifurcation without a significant stenosis relative to the more distal vessel. Cervical left ICA is normal. Vertebral arteries: The vertebral artery is the dominant vessel. Both vertebral arteries originate from the subclavian arteries. Atherosclerotic calcifications are present at the origin of the dominant right vertebral artery. Stenosis is less than 50%. Left vertebral artery is hypoplastic. 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 Skeleton: Multilevel endplate degenerative changes are present in the cervical spine uncovertebral spurring contributes to foraminal stenosis at C3-4, C5-6, and C6-7. No focal lytic or blastic lesions are present. Other neck: The soft tissues of the neck are within normal limits. No focal mucosal or submucosal lesions are present. There is no significant adenopathy. Salivary glands are normal. Thyroid is within normal limits. Upper chest: The lung apices are normal. Thoracic inlet is within normal limits. Review of the MIP images confirms the above findings CTA HEAD FINDINGS Anterior circulation: Atherosclerotic calcifications present within the cavernous internal carotid arteries  bilaterally without a significant stenosis relative to the more distal vessels. The ICA termini are normal. The anterior communicating artery is patent. MCA bifurcations are within normal limits. The ACA and MCA branch vessels are normal. Posterior circulation: Calcification at the dural margin the right vertebral artery results in 50% stenosis. PICA origins are visualized and normal. Vertebrobasilar junction is within normal limits. The basilar artery is small. The right posterior cerebral artery originates from the basilar tip. The left posterior cerebral artery is of fetal type. There is mild irregularity within distal PCA vessels. Significant proximal stenosis or occlusion is present. Venous sinuses: The dural sinuses are patent. The straight sinus deep cerebral veins are intact. Cortical veins are normal. Anatomic variants: Fetal type left posterior cerebral artery. Prominent right posterior communicating artery. Review of the MIP images confirms the above findings IMPRESSION: 1. Atherosclerotic changes at the carotid bifurcations bilaterally without a significant stenosis relative to the more distal vessels. 2. 50% stenosis at the origin of the dominant right vertebral artery and at the dural margin. 3. 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 4. Mild distal small vessel disease without a significant proximal stenosis, aneurysm, or branch vessel occlusion within the circle-of-Willis. 5. Degenerative changes in the cervical spine as described. Electronically Signed   By: San Morelle M.D.   On: 01/31/2019 13:31   Dg Chest Portable 1 View  Result Date: 01/31/2019 CLINICAL DATA:  Central chest pain and dizziness since 2 a.m. today. EXAM: PORTABLE CHEST 1 VIEW COMPARISON:  12/28/2014. FINDINGS: Normal sized heart. Stable small amount of probable scarring at the left cardiac apex. Otherwise, clear lungs. Mild aortic tortuosity and  atheromatous calcifications. Probable old posttraumatic changes at the right acromioclavicular joint. IMPRESSION: No acute abnormality. Electronically Signed   By: Claudie Revering M.D.   On: 01/31/2019 12:04    Pending Labs Unresulted Labs (From admission, onward)    Start     Ordered   02/07/19 0500  Creatinine, serum  (enoxaparin (LOVENOX)    CrCl >/= 30 ml/min)  Weekly,   R    Comments: while on enoxaparin therapy    01/31/19 1435   02/01/19 0500  Hemoglobin A1c  Tomorrow morning,   R     01/31/19 1435   02/01/19 0500  Lipid panel  Tomorrow morning,   R    Comments: Fasting    01/31/19 1435  02/01/19 7048  Basic metabolic panel  Tomorrow morning,   R     01/31/19 1435   02/01/19 0500  Magnesium  Tomorrow morning,   R     01/31/19 1435   01/31/19 1433  HIV antibody (Routine Testing)  Once,   STAT     01/31/19 1435   01/31/19 1433  CBC  (enoxaparin (LOVENOX)    CrCl >/= 30 ml/min)  Once,   STAT    Comments: Baseline for enoxaparin therapy IF NOT ALREADY DRAWN.  Notify MD if PLT < 100 K.    01/31/19 1435   01/31/19 1433  Creatinine, serum  (enoxaparin (LOVENOX)    CrCl >/= 30 ml/min)  Once,   STAT    Comments: Baseline for enoxaparin therapy IF NOT ALREADY DRAWN.    01/31/19 1435          Vitals/Pain Today's Vitals   01/31/19 1300 01/31/19 1350 01/31/19 1352 01/31/19 1353  BP: (!) 167/87 (!) 187/163    Pulse: 76   81  Resp: 20 16  18   Temp:      TempSrc:      SpO2: 96%   98%  Weight:      Height:      PainSc:   0-No pain     Isolation Precautions No active isolations  Medications Medications  aspirin EC tablet 81 mg (has no administration in time range)  atorvastatin (LIPITOR) tablet 40 mg (has no administration in time range)  nitroGLYCERIN (NITROSTAT) SL tablet 0.4 mg (has no administration in time range)  pantoprazole (PROTONIX) EC tablet 40 mg (has no administration in time range)  clopidogrel (PLAVIX) tablet 75 mg (has no administration in time range)    stroke: mapping our early stages of recovery book (has no administration in time range)  0.9 %  sodium chloride infusion (has no administration in time range)  acetaminophen (TYLENOL) tablet 650 mg (has no administration in time range)    Or  acetaminophen (TYLENOL) solution 650 mg (has no administration in time range)    Or  acetaminophen (TYLENOL) suppository 650 mg (has no administration in time range)  enoxaparin (LOVENOX) injection 40 mg (has no administration in time range)  potassium chloride SA (K-DUR) CR tablet 40 mEq (has no administration in time range)  hydrALAZINE (APRESOLINE) injection 10 mg (has no administration in time range)  nicotine (NICODERM CQ - dosed in mg/24 hr) patch 7 mg (has no administration in time range)  iohexol (OMNIPAQUE) 350 MG/ML injection 100 mL (100 mLs Intravenous Contrast Given 01/31/19 1243)    Mobility walks Low fall risk   Focused Assessments Neuro Assessment Handoff:  Swallow screen pass? Yes  Cardiac Rhythm: Normal sinus rhythm NIH Stroke Scale ( + Modified Stroke Scale Criteria)  Interval: Initial Level of Consciousness (1a.)   : Alert, keenly responsive LOC Questions (1b. )   +: Answers both questions correctly LOC Commands (1c. )   + : Performs both tasks correctly Best Gaze (2. )  +: Normal Visual (3. )  +: No visual loss Facial Palsy (4. )    : Normal symmetrical movements Motor Arm, Left (5a. )   +: No drift Motor Arm, Right (5b. )   +: No drift Motor Leg, Left (6a. )   +: No drift Motor Leg, Right (6b. )   +: No drift Limb Ataxia (7. ): Absent Sensory (8. )   +: Normal, no sensory loss Best Language (9. )   +: No aphasia  Dysarthria (10. ): Normal Extinction/Inattention (11.)   +: No Abnormality Modified SS Total  +: 0 Complete NIHSS TOTAL: 0     Neuro Assessment: Within Defined Limits Neuro Checks:   Initial (01/31/19 1448)  Last Documented NIHSS Modified Score: 0 (01/31/19 1448) Has TPA been given? No If patient is a  Neuro Trauma and patient is going to OR before floor call report to [place] nurse: [phone] or [phone]     R Recommendations: See Admitting Provider Note  Report given to:   Additional Notes:  Patient alert x4. Reports no deficits.

## 2019-01-31 NOTE — ED Notes (Signed)
Patient transported to CT 

## 2019-01-31 NOTE — ED Triage Notes (Signed)
Patient complains of chest pain and dizziness that started this morning at 2am. States he woke up at 2 am with symptoms. Patient states he went to bed at 12am. He complains of tightness in center of chest.

## 2019-02-01 ENCOUNTER — Inpatient Hospital Stay (HOSPITAL_COMMUNITY): Payer: Medicaid Other

## 2019-02-01 LAB — BASIC METABOLIC PANEL
Anion gap: 9 (ref 5–15)
BUN: 15 mg/dL (ref 8–23)
CO2: 28 mmol/L (ref 22–32)
Calcium: 9.4 mg/dL (ref 8.9–10.3)
Chloride: 102 mmol/L (ref 98–111)
Creatinine, Ser: 0.78 mg/dL (ref 0.61–1.24)
GFR calc Af Amer: >60 mL/min (ref >60)
GFR calc non Af Amer: >60 mL/min (ref >60)
Glucose, Bld: 107 mg/dL — ABNORMAL HIGH (ref 70–99)
Potassium: 3.8 mmol/L (ref 3.5–5.1)
Sodium: 139 mmol/L (ref 135–145)

## 2019-02-01 LAB — HIV ANTIBODY (ROUTINE TESTING W REFLEX): HIV Screen 4th Generation wRfx: NONREACTIVE

## 2019-02-01 LAB — LIPID PANEL
Cholesterol: 184 mg/dL (ref 0–200)
HDL: 39 mg/dL — ABNORMAL LOW (ref >40)
LDL Cholesterol: UNDETERMINED mg/dL (ref 0–99)
Total CHOL/HDL Ratio: 4.7 RATIO
Triglycerides: 443 mg/dL — ABNORMAL HIGH (ref <150)
VLDL: UNDETERMINED mg/dL (ref 0–40)

## 2019-02-01 LAB — HEMOGLOBIN A1C
Hgb A1c MFr Bld: 5.9 % — ABNORMAL HIGH (ref 4.8–5.6)
Mean Plasma Glucose: 122.63 mg/dL

## 2019-02-01 LAB — LDL CHOLESTEROL, DIRECT: Direct LDL: 100.2 mg/dL — ABNORMAL HIGH (ref 0–99)

## 2019-02-01 LAB — MAGNESIUM: Magnesium: 2 mg/dL (ref 1.7–2.4)

## 2019-02-01 MED ORDER — CLONIDINE HCL 0.1 MG PO TABS: 0.1000 mg | ORAL_TABLET | Freq: Two times a day (BID) | ORAL | 3 refills | Status: DC

## 2019-02-01 NOTE — Discharge Summary (Addendum)
Physician Discharge Summary  Duane Adams:[id number] DOB: 1956/03/13 DOA: 01/31/2019  PCP: Doree Albee, MD  Admit date: 01/31/2019  Discharge date: 02/01/2019  Admitted From:Home  Disposition:  Home  Recommendations for Outpatient Follow-up:  1. Follow up with PCP in 1-2 weeks 2. Continue on home blood pressure medications as prior and have blood pressure rechecked in the office 3. Consider 2D echocardiogram in the outpatient setting if persistent chest pain noted 4. 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  Home Health: None  Equipment/Devices: None  Discharge Condition: Stable  CODE STATUS: Full  Diet recommendation: Heart Healthy  Brief/Interim Summary: Per HPI: Duane Adams is a 63 y.o. male with medical history significant for severe hypertension with poor control, CAD with prior stent, GERD, and tobacco abuse who presented to the emergency department with complaints of substernal chest pain as well as some dizziness that began at approximately 2 AM.  He is unsure if this was actually the case, but does recall waking up later in the morning around 5 AM when he noticed that he was quite "swimmy headed" and would notice the room spinning when he moves his eyes around.  He has had similar kinds of symptoms previously when his blood pressures have been noted to be elevated.  He states that this is not quite unusual for him.  He additionally noted some substernal chest pain which is unusual.  He denies any headaches, other visual changes, or weakness in his limbs.  No sensory deficits noted.  He was noted to have some trouble with his speech and getting words out in general and felt as though he was slowed mentally.  He denies any  speech slurring or facial droop.  Alarmed that his symptoms, he called his cardiologist Dr. Harl Bowie and his staff told the patient to come to the ED for further evaluation.  Patient states that he is compliant with his Plavix and aspirin on a daily basis along with his other medications.  Patient was admitted with acute onset dizziness with mild dysarthria and was suspect for CVA, however brain MRI did not reveal new CVA.  He is to continue on his prior home medications of aspirin and Plavix as well as statin.  2D echocardiogram can be followed up on if he continues to have further symptoms of chest pain in the outpatient setting.  His blood pressures have improved after reinitiation of his home blood pressure agents and overall he states that he has felt much better with no further symptomatology.  He denies any further dizziness or chest pain.  2 sets of high-sensitivity troponins have also remained negative and EKG is without changes.  I have counseled him on smoking cessation which is likely contributing to his blood pressure rise and he states that he will continue to work on this and has nicotine patches at home.  I have also advised him on a low-sodium diet and he states that he will work on this as well.  He is otherwise stable for discharge with no other acute events noted during the inpatient stay.  Discharge Diagnoses:  Active Problems:   CVA (cerebral vascular accident) [facility])  Principal discharge diagnoses: Hypertensive crisis, CVA ruled out.  Discharge Instructions  Discharge Instructions  Diet - low sodium heart healthy   Complete by: As directed    Increase activity slowly   Complete by: As directed      Allergies as of 02/01/2019      Reactions   Lisinopril Swelling   Swelling neck   Chantix [varenicline] Other (See Comments)   Abdominal pain   Neosporin [neomycin-bacitracin Zn-polymyx] Rash   blistered      Medication List    TAKE these medications   aspirin EC 81 MG  tablet Take 81 mg by mouth at bedtime.   atorvastatin 40 MG tablet Commonly known as: LIPITOR Take 1 tablet (40 mg total) by mouth daily.   chlorthalidone 25 MG tablet Commonly known as: HYGROTON TAKE 1 TABLET BY MOUTH DAILY. What changed: Another medication with the same name was removed. Continue taking this medication, and follow the directions you see here.   cloNIDine 0.1 MG tablet Commonly known as: CATAPRES Take 1 tablet (0.1 mg total) by mouth 2 (two) times daily.   clopidogrel 75 MG tablet Commonly known as: PLAVIX TAKE 1 TABLET BY MOUTH ONCE DAILY.   isosorbide mononitrate 30 MG 24 hr tablet Commonly known as: Imdur Take 0.5 tablets (15 mg total) by mouth daily. What changed: Another medication with the same name was removed. Continue taking this medication, and follow the directions you see here.   isosorbide mononitrate 30 MG 24 hr tablet Commonly known as: IMDUR TAKE 1/2 TABLET BY MOUTH ONCE DAILY. What changed: Another medication with the same name was removed. Continue taking this medication, and follow the directions you see here.   metoprolol succinate 50 MG 24 hr tablet Commonly known as: TOPROL-XL TAKE 1 TABLET BY MOUTH DAILY. What changed:   how much to take  how to take this  when to take this  additional instructions   multivitamin with minerals Tabs tablet Take 1 tablet by mouth daily.   nitroGLYCERIN 0.4 MG SL tablet Commonly known as: Nitrostat Place 1 tablet (0.4 mg total) under the tongue every 5 (five) minutes as needed for chest pain.   pantoprazole 40 MG tablet Commonly known as: PROTONIX TAKE ONE TABLET BY MOUTH ONCE DAILY.   potassium chloride SA 20 MEQ tablet Commonly known as: K-DUR Take 1 tablet (20 mEq total) by mouth daily.   testosterone cypionate 200 MG/ML injection Commonly known as: DEPOTESTOSTERONE CYPIONATE INJECT 0.5 MLS INTO THE MUSCLE ONCE A WEEK   Vitamin D3 50 MCG (2000 UT) Tabs Take 1 tablet by mouth  daily.      Follow-up Information    Gosrani, Nimish C, MD Follow up in 1 week(s).   Specialty: Internal Medicine Contact information: [place] [address] [phone]        Arnoldo Lenis, MD .   Specialty: Cardiology Contact information: [place] [address] [phone]          Allergies  Allergen Reactions  . Lisinopril Swelling    Swelling neck  . Chantix [Varenicline] Other (See Comments)    Abdominal pain  . Neosporin [Neomycin-Bacitracin Zn-Polymyx] Rash    blistered    Consultations:  None   Procedures/Studies: Ct Angio Head W Or Wo Contrast  Result Date: 01/31/2019 CLINICAL DATA:  Focal neuro deficit, greater than 6 hours, stroke suspected. Chest pain and dizziness beginning at 2 a.m. Patient awoke with the symptoms. EXAM: CT ANGIOGRAPHY HEAD AND NECK TECHNIQUE: Multidetector CT imaging of the head and neck was performed using the standard  protocol during bolus administration of intravenous contrast. Multiplanar CT image reconstructions and MIPs were obtained to evaluate the vascular anatomy. Carotid stenosis measurements (when applicable) are obtained utilizing NASCET criteria, using the distal internal carotid diameter as the denominator. CONTRAST:  171mL OMNIPAQUE IOHEXOL 350 MG/ML SOLN COMPARISON:  CT head without contrast 09/26/2009. Lacunar infarct involving the right thalamus genu of the internal capsule appears remote. A remote lacunar infarct is also present at the right lentiform nucleus. Mild subcortical white matter changes are noted bilaterally. No acute cortical abnormality is present. Basal ganglia are otherwise within normal limits. The ventricles are of normal size. No significant extraaxial fluid collection is present. FINDINGS: CT HEAD FINDINGS Brain: Nonhemorrhagic age indeterminate lacunar infarct is present in the right thalamus and internal capsule. A remote lacunar infarct is present adjacent to the  right lentiform nucleus. Basal ganglia are otherwise intact. Acute or focal cortical abnormality is present. The ventricles are normal. No significant extraaxial fluid collection is present. Vascular: Atherosclerotic calcifications are present within the cavernous internal carotid arteries. There is no hyperdense vessel. Skull: Calvarium is intact. No focal lytic or blastic lesions are present. Sinuses: The paranasal sinuses and mastoid air cells are clear. Orbits: The globes and orbits are within normal limits. Review of the MIP images confirms the above findings CTA NECK FINDINGS Aortic arch: Atherosclerotic changes are noted at the aortic arch the great vessel origins without a significant stenosis or focal aneurysm. Right carotid system: The right common carotid artery is within normal limits. There is no significant stenosis relative to the more distal vessel. Cervical right ICA is normal. Left carotid system: The left common carotid artery is within normal limits. Atherosclerotic calcifications are present at the carotid bifurcation without a significant stenosis relative to the more distal vessel. Cervical left ICA is normal. Vertebral arteries: The vertebral artery is the dominant vessel. Both vertebral arteries originate from the subclavian arteries. Atherosclerotic calcifications are present at the origin of the dominant right vertebral artery. Stenosis is less than 50%. Left vertebral artery is hypoplastic. 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 Skeleton: Multilevel endplate degenerative changes are present in the cervical spine uncovertebral spurring contributes to foraminal stenosis at C3-4, C5-6, and C6-7. No focal lytic or blastic lesions are present. Other neck: The soft tissues of the neck are within normal limits. No focal mucosal or submucosal lesions are present. There is no significant adenopathy. Salivary glands are normal.  Thyroid is within normal limits. Upper chest: The lung apices are normal. Thoracic inlet is within normal limits. Review of the MIP images confirms the above findings CTA HEAD FINDINGS Anterior circulation: Atherosclerotic calcifications present within the cavernous internal carotid arteries bilaterally without a significant stenosis relative to the more distal vessels. The ICA termini are normal. The anterior communicating artery is patent. MCA bifurcations are within normal limits. The ACA and MCA branch vessels are normal. Posterior circulation: Calcification at the dural margin the right vertebral artery results in 50% stenosis. PICA origins are visualized and normal. Vertebrobasilar junction is within normal limits. The basilar artery is small. The right posterior cerebral artery originates from the basilar tip. The left posterior cerebral artery is of fetal type. There is mild irregularity within distal PCA vessels. Significant proximal stenosis or occlusion is present. Venous sinuses: The dural sinuses are patent. The straight sinus deep cerebral veins are intact. Cortical veins are normal. Anatomic variants: Fetal type left posterior cerebral artery.  Prominent right posterior communicating artery. Review of the MIP images confirms the above findings IMPRESSION: 1. Atherosclerotic changes at the carotid bifurcations bilaterally without a significant stenosis relative to the more distal vessels. 2. 50% stenosis at the origin of the dominant right vertebral artery and at the dural margin. 3. 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 4. Mild distal small vessel disease without a significant proximal stenosis, aneurysm, or branch vessel occlusion within the circle-of-Willis. 5. Degenerative changes in the cervical spine as described. Electronically Signed   By: San Morelle M.D.   On: 01/31/2019 13:31   Ct Angio Neck W And/or Wo  Contrast  Result Date: 01/31/2019 CLINICAL DATA:  Focal neuro deficit, greater than 6 hours, stroke suspected. Chest pain and dizziness beginning at 2 a.m. Patient awoke with the symptoms. EXAM: CT ANGIOGRAPHY HEAD AND NECK TECHNIQUE: Multidetector CT imaging of the head and neck was performed using the standard protocol during bolus administration of intravenous contrast. Multiplanar CT image reconstructions and MIPs were obtained to evaluate the vascular anatomy. Carotid stenosis measurements (when applicable) are obtained utilizing NASCET criteria, using the distal internal carotid diameter as the denominator. CONTRAST:  149mL OMNIPAQUE IOHEXOL 350 MG/ML SOLN COMPARISON:  CT head without contrast 09/26/2009. Lacunar infarct involving the right thalamus genu of the internal capsule appears remote. A remote lacunar infarct is also present at the right lentiform nucleus. Mild subcortical white matter changes are noted bilaterally. No acute cortical abnormality is present. Basal ganglia are otherwise within normal limits. The ventricles are of normal size. No significant extraaxial fluid collection is present. FINDINGS: CT HEAD FINDINGS Brain: Nonhemorrhagic age indeterminate lacunar infarct is present in the right thalamus and internal capsule. A remote lacunar infarct is present adjacent to the right lentiform nucleus. Basal ganglia are otherwise intact. Acute or focal cortical abnormality is present. The ventricles are normal. No significant extraaxial fluid collection is present. Vascular: Atherosclerotic calcifications are present within the cavernous internal carotid arteries. There is no hyperdense vessel. Skull: Calvarium is intact. No focal lytic or blastic lesions are present. Sinuses: The paranasal sinuses and mastoid air cells are clear. Orbits: The globes and orbits are within normal limits. Review of the MIP images confirms the above findings CTA NECK FINDINGS Aortic arch: Atherosclerotic changes are  noted at the aortic arch the great vessel origins without a significant stenosis or focal aneurysm. Right carotid system: The right common carotid artery is within normal limits. There is no significant stenosis relative to the more distal vessel. Cervical right ICA is normal. Left carotid system: The left common carotid artery is within normal limits. Atherosclerotic calcifications are present at the carotid bifurcation without a significant stenosis relative to the more distal vessel. Cervical left ICA is normal. Vertebral arteries: The vertebral artery is the dominant vessel. Both vertebral arteries originate from the subclavian arteries. Atherosclerotic calcifications are present at the origin of the dominant right vertebral artery. Stenosis is less than 50%. Left vertebral artery is hypoplastic. 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 Skeleton: Multilevel endplate degenerative changes are present in the cervical spine uncovertebral spurring contributes to foraminal stenosis at C3-4, C5-6, and C6-7. No focal lytic or blastic lesions are present. Other neck: The soft tissues of the neck are within normal limits. No focal mucosal or submucosal lesions are present. There is no significant adenopathy. Salivary glands are normal. Thyroid is within  normal limits. Upper chest: The lung apices are normal. Thoracic inlet is within normal limits. Review of the MIP images confirms the above findings CTA HEAD FINDINGS Anterior circulation: Atherosclerotic calcifications present within the cavernous internal carotid arteries bilaterally without a significant stenosis relative to the more distal vessels. The ICA termini are normal. The anterior communicating artery is patent. MCA bifurcations are within normal limits. The ACA and MCA branch vessels are normal. Posterior circulation: Calcification at the dural margin the right vertebral artery results in 50%  stenosis. PICA origins are visualized and normal. Vertebrobasilar junction is within normal limits. The basilar artery is small. The right posterior cerebral artery originates from the basilar tip. The left posterior cerebral artery is of fetal type. There is mild irregularity within distal PCA vessels. Significant proximal stenosis or occlusion is present. Venous sinuses: The dural sinuses are patent. The straight sinus deep cerebral veins are intact. Cortical veins are normal. Anatomic variants: Fetal type left posterior cerebral artery. Prominent right posterior communicating artery. Review of the MIP images confirms the above findings IMPRESSION: 1. Atherosclerotic changes at the carotid bifurcations bilaterally without a significant stenosis relative to the more distal vessels. 2. 50% stenosis at the origin of the dominant right vertebral artery and at the dural margin. 3. 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 4. Mild distal small vessel disease without a significant proximal stenosis, aneurysm, or branch vessel occlusion within the circle-of-Willis. 5. Degenerative changes in the cervical spine as described. Electronically Signed   By: San Morelle M.D.   On: 01/31/2019 13:31   Mr Brain Wo Contrast  Result Date: 01/31/2019 CLINICAL DATA:  Chest pain and dizziness beginning at 2 a.m. Patient awoke with symptoms. EXAM: MRI HEAD WITHOUT CONTRAST TECHNIQUE: Multiplanar, multiecho pulse sequences of the brain and surrounding structures were obtained without intravenous contrast. COMPARISON:  CTA head without contrast and CTA head and neck 01/31/2019 FINDINGS: Brain: Diffusion-weighted images demonstrate no acute or subacute infarction. Periventricular and subcortical T2 hyperintensities bilaterally are mildly advanced for age. There is a remote lacunar infarct of the right putamen. A remote lacunar infarct is present in the posterior right  caudate head. A remote lacunar infarct is present in the left caudate head. White matter changes extend into the brainstem. Cerebellum is normal. The ventricles are of normal size. No significant extraaxial fluid collection is present. Vascular: Flow is present in the major intracranial arteries. Skull and upper cervical spine: The craniocervical junction is normal. Upper cervical spine is within normal limits. Marrow signal is unremarkable. Sinuses/Orbits: The paranasal sinuses and mastoid air cells are clear. The globes and orbits are within normal limits. IMPRESSION: 1. No acute intracranial abnormality. 2. Remote lacunar infarcts of the basal ganglia, right greater than left. 3. Periventricular and subcortical white matter changes bilaterally are mildly advanced for age. This likely reflects the sequela of chronic microvascular ischemia in this patient with hypertension, known coronary artery and peripheral artery disease and other lacunar infarcts. Electronically Signed   By: San Morelle M.D.   On: 01/31/2019 16:47   Dg Chest Portable 1 View  Result Date: 01/31/2019 CLINICAL DATA:  Central chest pain and dizziness since 2 a.m. today. EXAM: PORTABLE CHEST 1 VIEW COMPARISON:  12/28/2014. FINDINGS: Normal sized heart. Stable small amount of probable scarring at the left cardiac apex. Otherwise, clear lungs. Mild aortic tortuosity and atheromatous calcifications. Probable old posttraumatic changes at the right acromioclavicular joint. IMPRESSION: No acute abnormality. Electronically  Signed   By: Claudie Revering M.D.   On: 01/31/2019 12:04     Discharge Exam: Vitals:   02/01/19 0329 02/01/19 0718  BP: (!) 173/83 (!) 164/92  Pulse: 83 89  Resp: 20 16  Temp:  98.3 F (36.8 C)  SpO2: 96% 96%   Vitals:   01/31/19 2334 02/01/19 0121 02/01/19 0329 02/01/19 0718  BP: (!) 150/82 (!) 153/86 (!) 173/83 (!) 164/92  Pulse: 82 83 83 89  Resp: 20 16 20 16   Temp: 98.7 F (37.1 C)   98.3 F (36.8 C)   TempSrc: Oral   Oral  SpO2: 96% 98% 96% 96%  Weight:      Height:        General: Pt is alert, awake, not in acute distress Cardiovascular: RRR, S1/S2 +, no rubs, no gallops Respiratory: CTA bilaterally, no wheezing, no rhonchi Abdominal: Soft, NT, ND, bowel sounds + Extremities: no edema, no cyanosis    The results of significant diagnostics from this hospitalization (including imaging, microbiology, ancillary and laboratory) are listed below for reference.     Microbiology: No results found for this or any previous visit (from the past 240 hour(s)).   Labs: BNP (last 3 results) No results for input(s): BNP in the last 8760 hours. Basic Metabolic Panel: Recent Labs  Lab 01/31/19 1144 02/01/19 0425  NA 137 139  K 3.2* 3.8  CL 99 102  CO2 24 28  GLUCOSE 111* 107*  BUN 14 15  CREATININE 0.71 0.78  CALCIUM 9.7 9.4  MG  --  2.0   Liver Function Tests: Recent Labs  Lab 01/31/19 1144  AST 28  ALT 31  ALKPHOS 44  BILITOT 0.5  PROT 8.5*  ALBUMIN 4.9   No results for input(s): LIPASE, AMYLASE in the last 168 hours. No results for input(s): AMMONIA in the last 168 hours. CBC: Recent Labs  Lab 01/31/19 1144  WBC 8.2  HGB 16.5  HCT 48.2  MCV 94.0  PLT 206   Cardiac Enzymes: No results for input(s): CKTOTAL, CKMB, CKMBINDEX, TROPONINI in the last 168 hours. BNP: Invalid input(s): POCBNP CBG: No results for input(s): GLUCAP in the last 168 hours. D-Dimer No results for input(s): DDIMER in the last 72 hours. Hgb A1c No results for input(s): HGBA1C in the last 72 hours. Lipid Profile Recent Labs    02/01/19 0425  CHOL 184  HDL 39*  LDLCALC UNABLE TO CALCULATE IF TRIGLYCERIDE OVER 400 mg/dL  TRIG 443*  CHOLHDL 4.7   Thyroid function studies No results for input(s): TSH, T4TOTAL, T3FREE, THYROIDAB in the last 72 hours.  Invalid input(s): FREET3 Anemia work up No results for input(s): VITAMINB12, FOLATE, FERRITIN, TIBC, IRON, RETICCTPCT in the last  72 hours. Urinalysis    Component Value Date/Time   COLORURINE DARK YELLOW 05/05/2017 1135   APPEARANCEUR CLEAR 05/05/2017 1135   LABSPEC 1.028 05/05/2017 1135   PHURINE 7.0 05/05/2017 1135   GLUCOSEU NEGATIVE 05/05/2017 1135   HGBUR NEGATIVE 05/05/2017 1135   BILIRUBINUR NEGATIVE 10/30/2011 1209   KETONESUR TRACE (A) 05/05/2017 1135   PROTEINUR TRACE (A) 05/05/2017 1135   UROBILINOGEN 0.2 10/30/2011 1209   NITRITE NEGATIVE 05/05/2017 1135   LEUKOCYTESUR TRACE (A) 05/05/2017 1135   Sepsis Labs Invalid input(s): PROCALCITONIN,  WBC,  LACTICIDVEN Microbiology No results found for this or any previous visit (from the past 240 hour(s)).   Time coordinating discharge: 35 minutes  SIGNED:   Rodena Goldmann, DO [facility] 02/01/2019, 10:13 AM  If 7PM-7AM, please contact night-coverage [email or web address] Password [email or web address]

## 2019-02-01 NOTE — Progress Notes (Signed)
Nsg Discharge Note  Admit Date:  01/31/2019 Discharge date: 02/01/2019   [name] to be D/C'd Home per MD order.  AVS completed.  Patient able to verbalize understanding.  Discharge Medication: Allergies as of 02/01/2019      Reactions   Lisinopril Swelling   Swelling neck   Chantix [varenicline] Other (See Comments)   Abdominal pain   Neosporin [neomycin-bacitracin Zn-polymyx] Rash   blistered      Medication List    TAKE these medications   aspirin EC 81 MG tablet Take 81 mg by mouth at bedtime.   atorvastatin 40 MG tablet Commonly known as: LIPITOR Take 1 tablet (40 mg total) by mouth daily.   chlorthalidone 25 MG tablet Commonly known as: HYGROTON TAKE 1 TABLET BY MOUTH DAILY. What changed: Another medication with the same name was removed. Continue taking this medication, and follow the directions you see here.   cloNIDine 0.1 MG tablet Commonly known as: CATAPRES Take 1 tablet (0.1 mg total) by mouth 2 (two) times daily.   clopidogrel 75 MG tablet Commonly known as: PLAVIX TAKE 1 TABLET BY MOUTH ONCE DAILY.   isosorbide mononitrate 30 MG 24 hr tablet Commonly known as: Imdur Take 0.5 tablets (15 mg total) by mouth daily. What changed: Another medication with the same name was removed. Continue taking this medication, and follow the directions you see here.   isosorbide mononitrate 30 MG 24 hr tablet Commonly known as: IMDUR TAKE 1/2 TABLET BY MOUTH ONCE DAILY. What changed: Another medication with the same name was removed. Continue taking this medication, and follow the directions you see here.   metoprolol succinate 50 MG 24 hr tablet Commonly known as: TOPROL-XL TAKE 1 TABLET BY MOUTH DAILY. What changed:   how much to take  how to take this  when to take this  additional instructions   multivitamin with minerals Tabs tablet Take 1 tablet by mouth daily.   nitroGLYCERIN 0.4 MG SL tablet Commonly known as: Nitrostat Place 1 tablet (0.4  mg total) under the tongue every 5 (five) minutes as needed for chest pain.   pantoprazole 40 MG tablet Commonly known as: PROTONIX TAKE ONE TABLET BY MOUTH ONCE DAILY.   potassium chloride SA 20 MEQ tablet Commonly known as: K-DUR Take 1 tablet (20 mEq total) by mouth daily.   testosterone cypionate 200 MG/ML injection Commonly known as: DEPOTESTOSTERONE CYPIONATE INJECT 0.5 MLS INTO THE MUSCLE ONCE A WEEK   Vitamin D3 50 MCG (2000 UT) Tabs Take 1 tablet by mouth daily.       Discharge Assessment: Vitals:   02/01/19 0718 02/01/19 1013  BP: (!) 164/92 (!) 164/97  Pulse: 89   Resp: 16   Temp: 98.3 F (36.8 C)   SpO2: 96%    Skin clean, dry and intact without evidence of skin break down, no evidence of skin tears noted. IV catheter discontinued intact. Site without signs and symptoms of complications - no redness or edema noted at insertion site, patient denies c/o pain - only slight tenderness at site.  Dressing with slight pressure applied.  D/c Instructions-Education: Discharge instructions given to patient/ with verbalized understanding. D/c education completed with patient including follow up instructions, medication list, d/c activities limitations if indicated, with other d/c instructions as indicated by MD - patient able to verbalize understanding, all questions fully answered. Patient instructed to return to ED, call 911, or call MD for any changes in condition.  Patient escorted via [place], and D/C home  via private auto.  Calinda Stockinger Loletha Grayer, RN 02/01/2019 10:53 AM

## 2019-02-01 NOTE — Progress Notes (Signed)
While reviewing chart in was discovered that a Covid test had not been performed. MD notified and test ordered. Patient is refusing to have Covid test performed at this time. Provided teaching and patient still refusing to have Covid test performed. MD notified.

## 2019-02-01 NOTE — Evaluation (Signed)
Physical Therapy Evaluation Patient Details Name: Duane Adams MRN: [id number] DOB: 1956/03/04 Today's Date: 02/01/2019   History of Present Illness  Duane Adams is a 63 y.o. male with medical history significant for severe hypertension with poor control, CAD with prior stent, GERD, and tobacco abuse who presented to the emergency department with complaints of substernal chest pain as well as some dizziness that began at approximately 2 AM.  He is unsure if this was actually the case, but does recall waking up later in the morning around 5 AM when he noticed that he was quite "swimmy headed" and would notice the room spinning when he moves his eyes around.  He has had similar kinds of symptoms previously when his blood pressures have been noted to be elevated.  He states that this is not quite unusual for him.  He additionally noted some substernal chest pain which is unusual.  He denies any headaches, other visual changes, or weakness in his limbs.  No sensory deficits noted.  He was noted to have some trouble with his speech and getting words out in general and felt as though he was slowed mentally.  He denies any speech slurring or facial droop.  Alarmed that his symptoms, he called his cardiologist Dr. Harl Bowie and his staff told the patient to come to the ED for further evaluation.  Patient states that he is compliant with his Plavix and aspirin on a daily basis along with his other medications.    Clinical Impression  Patient functioning at baseline for functional mobility and gait.  Plan:  Patient discharged from physical therapy to care of nursing for ambulation daily as tolerated for length of stay.     Follow Up Recommendations No PT follow up    Equipment Recommendations  None recommended by PT    Recommendations for Other Services       Precautions / Restrictions Precautions Precautions: None Restrictions Weight Bearing Restrictions: No      Mobility  Bed  Mobility Overal bed mobility: Independent                Transfers Overall transfer level: Independent Equipment used: None                Ambulation/Gait Ambulation/Gait assistance: Modified independent (Device/Increase time) Gait Distance (Feet): 200 Feet Assistive device: None Gait Pattern/deviations: WFL(Within Functional Limits) Gait velocity: slightly decreased   General Gait Details: demonstrates good return for ambulation on level, inclined, and declined surfaces without loss of balance  Stairs            Wheelchair Mobility    Modified Rankin (Stroke Patients Only)       Balance Overall balance assessment: Independent                                           Pertinent Vitals/Pain Pain Assessment: No/denies pain    Home Living Family/patient expects to be discharged to:: Private residence Living Arrangements: Spouse/significant other Available Help at Discharge: Family Type of Home: House Home Access: Ramped entrance     Home Layout: One level Home Equipment: None      Prior Function Level of Independence: Independent         Comments: Hydrographic surveyor, drives     Hand Dominance        Extremity/Trunk Assessment   Upper Extremity Assessment Upper Extremity Assessment:  Overall [facility] for tasks assessed    Lower Extremity Assessment Lower Extremity Assessment: Overall WFL for tasks assessed    Cervical / Trunk Assessment Cervical / Trunk Assessment: Normal  Communication   Communication: No difficulties  Cognition Arousal/Alertness: Awake/alert Behavior During Therapy: WFL for tasks assessed/performed Overall Cognitive Status: Within Functional Limits for tasks assessed                                        General Comments      Exercises     Assessment/Plan    PT Assessment Patent does not need any further PT services  PT Problem List         PT Treatment  Interventions      PT Goals (Current goals can be found in the Care Plan section)  Acute Rehab PT Goals Patient Stated Goal: return home PT Goal Formulation: With patient Time For Goal Achievement: 02/01/19 Potential to Achieve Goals: Good    Frequency     Barriers to discharge        Co-evaluation               AM-PAC PT "6 Clicks" Mobility  Outcome Measure Help needed turning from your back to your side while in a flat bed without using bedrails?: None Help needed moving from lying on your back to sitting on the side of a flat bed without using bedrails?: None Help needed moving to and from a bed to a chair (including a wheelchair)?: None Help needed standing up from a chair using your arms (e.g., wheelchair or bedside chair)?: None Help needed to walk in hospital room?: None Help needed climbing 3-5 steps with a railing? : None 6 Click Score: 24    End of Session   Activity Tolerance: Patient tolerated treatment well Patient left: in bed;with call bell/phone within reach Nurse Communication: Mobility status PT Visit Diagnosis: Unsteadiness on feet (R26.81);Other abnormalities of gait and mobility (R26.89);Muscle weakness (generalized) (M62.81)    Time: 8403-7543 PT Time Calculation (min) (ACUTE ONLY): 20 min   Charges:   PT Evaluation $PT Eval Moderate Complexity: 1 Mod PT Treatments $Gait Training: 8-22 mins        9:16 AM, 02/01/19 Lonell Grandchild, MPT Physical Therapist with [facility] [phone] office [phone] mobile phone

## 2019-02-01 NOTE — Progress Notes (Signed)
OT Cancellation Note  Patient Details Name: Duane Adams MRN: [id number] DOB: 1955/12/12   Cancelled Treatment:    Reason Eval/Treat Not Completed: OT screened, no needs identified, will sign off.  OT screened in room, performing ADLs independently. Symptoms seemingly resolved this am. No further OT services required at this time.    Guadelupe Sabin, OTR/L  [phone] 02/01/2019, 7:51 AM

## 2019-02-02 ENCOUNTER — Other Ambulatory Visit: Payer: Self-pay

## 2019-02-02 ENCOUNTER — Ambulatory Visit (HOSPITAL_COMMUNITY)
Admission: RE | Admit: 2019-02-02 | Discharge: 2019-02-02 | Disposition: A | Discharge status: Home / Self Care | Payer: Medicaid Other | Source: Ambulatory Visit | Attending: Nurse Practitioner | Admitting: Nurse Practitioner

## 2019-02-02 DIAGNOSIS — K7460 Unspecified cirrhosis of liver: Secondary | ICD-10-CM | POA: Diagnosis not present

## 2019-02-02 DIAGNOSIS — N281 Cyst of kidney, acquired: Secondary | ICD-10-CM | POA: Diagnosis not present

## 2019-02-09 DIAGNOSIS — I871 Compression of vein: Secondary | ICD-10-CM | POA: Insufficient documentation

## 2019-02-09 NOTE — Progress Notes (Signed)
Cardiology Office Note    Date:  02/21/2019   ID:  [name], DOB Oct 18, 1955, MRN [id number]  PCP:  Doree Albee, MD  Cardiologist: Carlyle Dolly, MD EPS: None  Chief Complaint  Patient presents with  . Hospitalization Follow-up    History of Present Illness:  Duane Adams is a 63 y.o. male with history of CAD DES LAD and RCA 2014, normal LVEF on echo 2014. LHC 08/2016 [facility] mild diseasse with patent stents, history HTN, HLD, PAD followed by vascular.   Last seen by Dr. Harl Bowie 02/2018 and no changes.  Patient was discharged 02/01/19 after admission with hypertensive crisis BP 187/163 and severe dizziness. He also had some chest pain but troponins negative and EKG without change. MRI without acute CVA but possible microvascular ischemia. CT showed > 70% stenosis left subclavian proximal to vertebral takeoff. Recommend vascular surgeon if symptomatic or uncontrolled BP.    Patient says he can tell when his BP is running real high-gets foggy headed. BP running on average 150/85 at home, sometimes higher and lower, only checking it every 4 days or so. Doesn't think he uses a lot of salt and cooks for himself. Eats meat and potatoes, sausage, biscuits. Smoking just less than a pack a day. Trying patches, gum but having trouble.   Past Medical History:  Diagnosis Date  . Allergy   . Anxiety   . Cirrhosis ([place])   . Coronary artery disease    a. 03/2013 Cath/PCI: LM nl, LAD 45m(2.5x20 Promus Premier DES), LCX min irregs, RCA dom, 90p(3.0x20 Promus Premier DES), EF 55-65%.  . CVA (cerebral vascular accident) ([place] 01/31/2019  . Essential hypertension, benign   . GERD (gastroesophageal reflux disease)   . Helicobacter pylori gastritis JUN 2016 EGD/Bx   PREVPAK BID FOR 14 DAYS  . Hepatitis    Hepatitis C treated   . Hiatal hernia   . History of kidney stones   . Mixed hyperlipidemia   . Skin cancer   . Substance abuse ([place]    alcoholic quit 22023 . Tobacco abuse     Past Surgical History:  Procedure Laterality Date  . ABDOMINAL AORTOGRAM W/LOWER EXTREMITY N/A 06/15/2017   Procedure: ABDOMINAL AORTOGRAM W/LOWER EXTREMITY;  Surgeon: DAngelia Mould MD;  [facility];  Service: Cardiovascular;  Laterality: N/A;  Bilateral  . BIOPSY N/A 12/19/2014   Procedure: BIOPSY;  Surgeon: SDanie Binder MD;  Location: [facility];  Service: Endoscopy;  Laterality: N/A;  . COLONOSCOPY  June 2016   [facility]: with endoscopic mucosal resection. 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 Needs colonoscopy in 1 year.   . COLONOSCOPY WITH PROPOFOL N/A 12/19/2014   Dr. FOneida Alar six simple adenomas and 3 hyperplastic polyps. [facility] polyp [facility]  . CORONARY ANGIOPLASTY WITH STENT PLACEMENT  04/11/2013   LAD &  RCA     DR COOPER  . ESOPHAGOGASTRODUODENOSCOPY (EGD) WITH PROPOFOL N/A 12/19/2014   Dr. FOneida Alar  without varices, small hiatal hernia noted, moderate non-erosive gastritis and mild duodenitis.  POSITIVE H.PYLORI. Prescribed Prevpac.  .Marland KitchenFLEXIBLE SIGMOIDOSCOPY N/A 12/23/2017   Procedure: FLEXIBLE SIGMOIDOSCOPY;  Surgeon: FDanie Binder MD;  [facility];  Service: Endoscopy;  Laterality: N/A;  1:45pm  . HAND RECONSTRUCTION Left   . HEMORRHOID BANDING N/A 12/23/2017   Procedure: HThayer Jew  Surgeon: FDanie Binder MD;  [facility];  Service: Endoscopy;  Laterality: N/A;  . LEFT HEART CATHETERIZATION WITH CORONARY ANGIOGRAM N/A 04/11/2013   Procedure: LEFT HEART CATHETERIZATION WITH CORONARY ANGIOGRAM;  Surgeon: Blane Ohara, MD;  Location: [facility] CATH LAB;  Service: Cardiovascular;  Laterality: N/A;  . LEG TENDON SURGERY Right   . POLYPECTOMY N/A 12/19/2014   Procedure: POLYPECTOMY;  Surgeon: Danie Binder, MD;  Location: [facility];  Service: Endoscopy;  Laterality: N/A;    Current Medications: Current Meds   Medication Sig  . aspirin EC 81 MG tablet Take 81 mg by mouth at bedtime.   Marland Kitchen atorvastatin (LIPITOR) 40 MG tablet Take 1 tablet (40 mg total) by mouth daily.  . chlorthalidone (HYGROTON) 25 MG tablet TAKE 1 TABLET BY MOUTH DAILY.  Marland Kitchen Cholecalciferol (VITAMIN D3) 2000 units TABS Take 1 tablet by mouth daily.  . clopidogrel (PLAVIX) 75 MG tablet TAKE 1 TABLET BY MOUTH ONCE DAILY.  . isosorbide mononitrate (IMDUR) 30 MG 24 hr tablet Take 0.5 tablets (15 mg total) by mouth daily.  . metoprolol succinate (TOPROL-XL) 100 MG 24 hr tablet Take 100 mg by mouth daily. Take with or immediately following a meal.  . Multiple Vitamin (MULTIVITAMIN WITH MINERALS) TABS tablet Take 1 tablet by mouth daily.  . nitroGLYCERIN (NITROSTAT) 0.4 MG SL tablet Place 1 tablet (0.4 mg total) under the tongue every 5 (five) minutes as needed for chest pain.  . pantoprazole (PROTONIX) 40 MG tablet TAKE ONE TABLET BY MOUTH ONCE DAILY.  Marland Kitchen potassium chloride SA (K-DUR,KLOR-CON) 20 MEQ tablet Take 1 tablet (20 mEq total) by mouth daily.  . [DISCONTINUED] cloNIDine (CATAPRES) 0.1 MG tablet Take 1 tablet (0.1 mg total) by mouth 2 (two) times daily.  . [DISCONTINUED] isosorbide mononitrate (IMDUR) 30 MG 24 hr tablet Take 0.5 tablets (15 mg total) by mouth daily.     Allergies:   Lisinopril, Chantix [varenicline], and Neosporin [neomycin-bacitracin zn-polymyx]   Social History   Socioeconomic History  . Marital status: Soil scientist    Spouse name: Judie Petit  . Number of children: 2  . Years of education: 36  . Highest education level: Not on file  Occupational History  . Occupation: disabled    Comment: heart  Social Needs  . Financial resource strain: Not on file  . Food insecurity    Worry: Not on file    Inability: Not on file  . Transportation needs    Medical: Not on file    Non-medical: Not on file  Tobacco Use  . Smoking status: Current Every Day Smoker    Packs/day: 0.50    Years: 40.00    Pack years:  20.00    Types: Cigarettes    Start date: 07/14/1968  . Smokeless tobacco: Never Used  . Tobacco comment: 5-6 Cigarettes per day  Substance and Sexual Activity  . Alcohol use: Yes    Alcohol/week: 0.0 standard drinks    Comment: As of 11/26/17: social - 1-2 beers a sitting x 1-2 times a month  . Drug use: No  . Sexual activity: Not Currently  Lifestyle  . Physical activity    Days per week: Not on file    Minutes per session: Not on file  . Stress: Not on file  Relationships  . Social Herbalist on phone: Not on file    Gets together: Not on file    Attends religious service: Not on file    Active member of club or organization: Not  on file    Attends meetings of clubs or organizations: Not on file    Relationship status: Not on file  Other Topics Concern  . Not on file  Social History Narrative   Disabled from heart disease   Previously worked in Scientist, research (medical) with Judie Petit for 28 years   Leisure: computer     Family History:  The patient's   family history includes Alcohol abuse in his brother; Aneurysm in his mother; Diabetes in his paternal aunt; Heart disease in his brother and father; Hypertension in his brother, father, and mother; Stroke in his mother.   ROS:   Please see the history of present illness.    ROS All other systems reviewed and are negative.   PHYSICAL EXAM:   VS:  BP (!) 160/89   Pulse 98   Temp (!) 97.5 F (36.4 C)   Ht 5' 9" (1.753 m)   Wt 187 lb (84.8 kg)   SpO2 98%   BMI 27.62 kg/m   Physical Exam  GEN: Well nourished, well developed, in no acute distress  Neck: Bilateral carotid bruits and subclavian bruit no JVD,  Cardiac:RRR; no murmurs, rubs, or gallops  Respiratory: Decreased breath sounds throughout clear to auscultation bilaterally, normal work of breathing GI: soft, nontender, nondistended, + BS Ext: without cyanosis, clubbing, or edema, Good distal pulses bilaterally Neuro:  Alert and Oriented x 3 Psych: euthymic  mood, full affect  Wt Readings from Last 3 Encounters:  02/21/19 187 lb (84.8 kg)  01/31/19 185 lb (83.9 kg)  03/12/18 190 lb 12.8 oz (86.5 kg)      Studies/Labs Reviewed:   EKG:  EKG is not ordered today.   Recent Labs: 01/31/2019: ALT 31; Hemoglobin 16.5; Platelets 206 02/01/2019: BUN 15; Creatinine, Ser 0.78; Magnesium 2.0; Potassium 3.8; Sodium 139   Lipid Panel    Component Value Date/Time   CHOL 184 02/01/2019 0425   TRIG 443 (H) 02/01/2019 0425   HDL 39 (L) 02/01/2019 0425   CHOLHDL 4.7 02/01/2019 0425   VLDL UNABLE TO CALCULATE IF TRIGLYCERIDE OVER 400 mg/dL 02/01/2019 0425   LDLCALC UNABLE TO CALCULATE IF TRIGLYCERIDE OVER 400 mg/dL 02/01/2019 0425   LDLCALC 106 (H) 05/05/2017 1135   LDLDIRECT 100.2 (H) 02/01/2019 0425    Additional studies/ records that were reviewed today include:   04/11/13 Cath   PROCEDURAL FINDINGS   Hemodynamics:   AO 178/93   LV 177/18   Coronary angiography:   Coronary dominance: right   Left mainstem: Widely patent without obstructive disease   Left anterior descending (LAD): severe mid-vessel stenosis (80%) between the first and second septal perforators. The vessel reaches the LV apex and there is no other significant disease noted. Large D1 is patent.   Left circumflex (LCx): small vessel, supplies 2 OM branches. Mild irregularity noted.   Right coronary artery (RCA): large, dominant vessel. There is severe 90% proximal stenosis. Irregularity in the mid-vessel without significant stenosis. The PDA and PLA branches are both large without significant disease.   Left ventriculography: Left ventricular systolic function is normal, LVEF is estimated at 55-65%, there is no significant mitral regurgitation   PCI Note: Following the diagnostic procedure, the decision was made to proceed with PCI. The patient was loaded with plavix 600 mg. Weight-based bivalirudin was given for anticoagulation. I planned on treating the LAD and RCA, both of which  have high-grade disease. Once a therapeutic ACT was achieved, a 6 [place] XB-LAD guide catheter was inserted.  A cougar coronary guidewire was used to cross the lesion. The lesion was predilated with a 2.0 mm balloon. The lesion was then stented with a 2.5x20 mm Promus Premier drug-eluting stent. The stent was postdilated with a 2.75 mm noncompliant balloon. Following PCI, there was 0% residual stenosis and TIMI-3 flow. Attention was then turned to the RCA. A JR-4 guide was used. The same cougar guidewire was used to cross the lesion. The lesion was dilated with a 2.0 mm balloon and stented with a 3.0x20 mm Promus Premier DES. The stent was post-dilated to 18 atm with a 3.25 mm [place] balloon. Final angiography confirmed an excellent result. The patient tolerated the procedure well. There were no immediate procedural complications. A TR band was used for radial hemostasis. The patient was transferred to the post catheterization recovery area for further monitoring.   PCI Data:   Lesion 1   Vessel - LAD/Segment - mid   Percent Stenosis (pre) 80   TIMI-flow 3   Stent 2.5x20 mm Promus Premier DES   Percent Stenosis (post) 0   TIMI-flow (post) 3   Lesion 2   Vessel - RCA/Segment - prox   Percent Stenosis (pre) 90   TIMI-flow 3   Stent 3.0x20 mm Promus Premier DES   Percent Stenosis (post) 0   TIMI-flow (post) 3   Final Conclusions:   Severe 2 vessel CAD with successful PCI of the LAD and RCA   Normal LV function   Recommendations:   ASA and plavix for at least 12 months.     04/04/13 Echo   LVEF 60-65%, no WMA, grade I diastolic dysfunction       10/2014 Exercise Nuclear Stress IMPRESSION: 1. No reversible ischemia or infarction. Submaximal stress test, thus diminishing the ability of this test to detect ischemia.   2. Normal left ventricular wall motion.   3. Left ventricular ejection fraction 78%   4. Low-risk stress test findings*.  Consider Lexiscan in the future.     08/2016 [facility] cath  Angiography: LVgram: not performed LCA: LAD stent patent, mild luminal irregularites, no significant obstructive disease RCA: proximal stent patent, large R dominant RCA,mild luminal irregularities in distal branch  Impression:   Mild CAD with Patent Stents RCA and LAD Normal resting LVedp (14 mm)  Plan:  Follow-up with referring physician. Echocardiogram suggested to evaluate possible HFrEF as cause for symptoms  CT 01/2019   IMPRESSION: 1. Atherosclerotic changes at the carotid bifurcations bilaterally without a significant stenosis relative to the more distal vessels. 2. 50% stenosis at the origin of the dominant right vertebral artery and at the dural margin. 3. 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 4. Mild distal small vessel disease without a significant proximal stenosis, aneurysm, or branch vessel occlusion within the circle-of-Willis. 5. Degenerative changes in the cervical spine as described.    MRI 01/2019 IMPRESSION: 1. No acute intracranial abnormality. 2. Remote lacunar infarcts of the basal ganglia, right greater than left. 3. Periventricular and subcortical white matter changes bilaterally are mildly advanced for age. This likely reflects the sequela of chronic microvascular ischemia in this patient with hypertension, known coronary artery and peripheral artery disease and other lacunar infarcts.         ASSESSMENT:    1. Coronary artery disease involving native coronary artery of native heart without angina pectoris   2. Essential hypertension   3. Mixed hyperlipidemia   4. Subclavian vein stenosis  PLAN:  In order of problems listed above:  CAD DES LAD and RCA 2014, normal LVEF on echo 2014, mild disease LHC 08/2016 [facility]-no angina  Hypertensive emergency 01/2019-blood pressure still running high.  Will increase clonidine to 0.2 mg 3 times daily.  I have asked patient to take his  blood pressure daily.  Follow-up blood pressure check in 2 weeks follow-up with Dr. Harl Bowie next available.  HLD continue Lipitor LDL not calculated because triglycerides were 443 02/01/2019 should have repeat next office visit.  Consider Vascepa.  PAD-> 70% left subclavian stenosis-Saw Dr. Scot Dock 06/2017 and had aortogram and bilateral iliac arteriogram that was normal-scheduled to see him in September.      Medication Adjustments/Labs and Tests Ordered: Current medicines are reviewed at length with the patient today.  Concerns regarding medicines are outlined above.  Medication changes, Labs and Tests ordered today are listed in the Patient Instructions below. Patient Instructions  Medication Instructions: INCREASE Clonidine to 0.2 mg three times a day  Labwork: None  Procedures/Testing: None  Follow-Up: Next available with Dr.Branch  Nurse apt in 2 weeks for BP check  Any Additional Special Instructions Will Be Listed Below (If Applicable).   Follow 2 gram sodium diet provided If you need a refill on your cardiac medications before your next appointment, please call your pharmacy.      Sumner Boast, PA-C  02/21/2019 12:13 PM    [facility] [place], [place], [place]  [address] Phone: [phone]; Fax: [phone]

## 2019-02-16 ENCOUNTER — Other Ambulatory Visit: Payer: Self-pay | Admitting: Cardiology

## 2019-02-16 DIAGNOSIS — E782 Mixed hyperlipidemia: Secondary | ICD-10-CM

## 2019-02-21 ENCOUNTER — Other Ambulatory Visit: Payer: Self-pay

## 2019-02-21 ENCOUNTER — Ambulatory Visit (INDEPENDENT_AMBULATORY_CARE_PROVIDER_SITE_OTHER): Payer: Medicaid Other | Admitting: Physician Assistant

## 2019-02-21 ENCOUNTER — Encounter: Payer: Self-pay | Admitting: Physician Assistant

## 2019-02-21 VITALS — BP 160/89 | HR 98 | Temp 97.5°F | Ht 69.0 in | Wt 187.0 lb

## 2019-02-21 DIAGNOSIS — I871 Compression of vein: Secondary | ICD-10-CM | POA: Diagnosis not present

## 2019-02-21 DIAGNOSIS — I2510 Atherosclerotic heart disease of native coronary artery without angina pectoris: Secondary | ICD-10-CM | POA: Diagnosis not present

## 2019-02-21 DIAGNOSIS — E782 Mixed hyperlipidemia: Secondary | ICD-10-CM | POA: Diagnosis not present

## 2019-02-21 DIAGNOSIS — I10 Essential (primary) hypertension: Secondary | ICD-10-CM

## 2019-02-21 MED ORDER — CLONIDINE HCL 0.2 MG PO TABS: 0.2000 mg | ORAL_TABLET | Freq: Three times a day (TID) | ORAL | 3 refills | Status: DC

## 2019-02-21 MED ORDER — ISOSORBIDE MONONITRATE ER 30 MG PO TB24: 15.0000 mg | ORAL_TABLET | Freq: Every day | ORAL | 3 refills | Status: DC

## 2019-02-21 NOTE — Patient Instructions (Addendum)
Medication Instructions: INCREASE Clonidine to 0.2 mg three times a day  Labwork: None  Procedures/Testing: None  Follow-Up: Next available with Dr.Branch  Nurse apt in 2 weeks for BP check  Any Additional Special Instructions Will Be Listed Below (If Applicable).   Follow 2 gram sodium diet provided If you need a refill on your cardiac medications before your next appointment, please call your pharmacy.

## 2019-02-22 ENCOUNTER — Other Ambulatory Visit: Payer: Self-pay

## 2019-02-22 DIAGNOSIS — I7409 Other arterial embolism and thrombosis of abdominal aorta: Secondary | ICD-10-CM

## 2019-03-08 ENCOUNTER — Ambulatory Visit: Payer: Medicaid Other | Admitting: *Deleted

## 2019-03-08 ENCOUNTER — Other Ambulatory Visit: Payer: Self-pay

## 2019-03-08 DIAGNOSIS — I10 Essential (primary) hypertension: Secondary | ICD-10-CM

## 2019-03-08 NOTE — Progress Notes (Signed)
Pt here for BP check after 8/10 increase of clonidine 0.2 mg tid - says he has been compliant with taking as directed. Pt c/o being tired/sleepy having to take several naps during the day since increase. BP today by nursing check 127/78 HR 78

## 2019-03-08 NOTE — Progress Notes (Signed)
Imogene Burn, PA-C sent to Johnika Escareno T, CMA        Blood pressure much better. See if patient can tolerate it a little bit longer to see if he can adjust to it. Thanks   Pt agreeable to continue to take for now, has f/u with Dr Harl Bowie in October, will contact us if he doesn't think he can continue to tolerate

## 2019-03-16 ENCOUNTER — Other Ambulatory Visit: Payer: Self-pay

## 2019-03-16 ENCOUNTER — Ambulatory Visit (INDEPENDENT_AMBULATORY_CARE_PROVIDER_SITE_OTHER): Payer: Medicaid Other | Admitting: Vascular Surgery

## 2019-03-16 ENCOUNTER — Encounter: Payer: Self-pay | Admitting: Vascular Surgery

## 2019-03-16 ENCOUNTER — Ambulatory Visit (HOSPITAL_COMMUNITY)
Admission: RE | Admit: 2019-03-16 | Discharge: 2019-03-16 | Disposition: A | Discharge status: Home / Self Care | Payer: Medicaid Other | Source: Ambulatory Visit | Attending: Vascular Surgery | Admitting: Vascular Surgery

## 2019-03-16 VITALS — BP 126/78 | HR 85 | Temp 97.6°F | Resp 20 | Ht 69.0 in | Wt 188.0 lb

## 2019-03-16 DIAGNOSIS — I7409 Other arterial embolism and thrombosis of abdominal aorta: Secondary | ICD-10-CM | POA: Diagnosis not present

## 2019-03-16 DIAGNOSIS — I739 Peripheral vascular disease, unspecified: Secondary | ICD-10-CM | POA: Diagnosis not present

## 2019-03-16 NOTE — Progress Notes (Signed)
REASON FOR CONSULT:    Follow-up of peripheral vascular disease.  ASSESSMENT & PLAN:   PERIPHERAL VASCULAR DISEASE: The patient has had a change in his Doppler signals in the left leg with an ABI of 74% now.  However his symptoms clearly do not fit with the symptoms from peripheral vascular disease.  He describes burning pain in both hips which is brought on by ambulation although his circulation on the right is normal.  He did have some mild left common iliac artery stenosis back in December 2018 and this may have progressed since that time.  However currently his symptoms are quite stable so we will hold off on repeat arteriography.  I have ordered ABIs in 6 months and I will see him back at that time.  His arteriogram back in December 2018 showed some slight dilatation of the mid infrarenal aorta so I have also ordered a duplex of his of his abdominal aorta when he returns in 6 months just to be sure he has no evidence of a small aneurysm.  LEFT SUBCLAVIAN STENOSIS: CT angiogram of the neck from his admission in July showed a 70% left subclavian artery stenosis.  He really does not have any vertebrobasilar symptoms.  I explained to him that when his blood pressure is taken it should always be taken from his right arm in order for it to be accurate.  I have ordered a follow-up carotid duplex scan in 6 months and this includes evaluating the subclavian arteries.  I will see him back at that time.  [name], MD, FACS Beeper [phone] Office: [phone]   HPI:   Duane Adams is a pleasant 63 y.o. male, who I had seen back in 2018.  He had presented with bilateral lower extremity hip thigh and calf claudication.  He had essentially normal noninvasive studies.  However his history was classic for aortoiliac occlusive disease.  He had no history of disc problems or back problems.  He underwent an arteriogram on 06/15/2017.  This showed that the infrarenal aorta was widely patent.  There  was some mild irregularity in the midportion of the infrarenal aorta but this did not produce any flow limitation.  He had no significant inflow disease on either side and no significant infrainguinal arterial occlusive disease.  I suggested possible neurologic exam if his symptoms progress.  I recommended an aortoiliac duplex in 1 year.  He comes in for that study.  Since I saw him last his main complaint is burning in both hips which is brought on by ambulation especially when he walks upstairs.  His symptoms are equal on both sides.  He also has some back pain and some radiation to the right leg.  I do not get any history of calf claudication on either side.  He denies any history of rest pain or nonhealing ulcers.  He had smoked a pack per day of cigarettes for 45 years but is cut back to 2 to 3 cigarettes a day.  His other risk factors for peripheral vascular disease include hypertension and hypercholesterolemia.  He denies any history of diabetes or family history of premature cardiovascular disease.  He denies any problems with persistent dizziness or unsteady gait.  Of note, the patient was admitted with a hypertensive crisis in August 2020.  Blood pressure reportedly was as high as 187/163.  MRI did not show any evidence of an acute stroke.  He had a CT angiogram of the neck which showed some mild  carotid disease but no significant stenosis.    Past Medical History:  Diagnosis Date  . Allergy   . Anxiety   . Cirrhosis ([place])   . Coronary artery disease    a. 03/2013 Cath/PCI: LM nl, LAD 18m (2.5x20 Promus Premier DES), LCX min irregs, RCA dom, 90p(3.0x20 Promus Premier DES), EF 55-65%.  . CVA (cerebral vascular accident) ([place]) 01/31/2019  . Essential hypertension, benign   . GERD (gastroesophageal reflux disease)   . Helicobacter pylori gastritis JUN 2016 EGD/Bx   PREVPAK BID FOR 14 DAYS  . Hepatitis    Hepatitis C treated   . Hiatal hernia   . History of kidney stones   . Mixed  hyperlipidemia   . Skin cancer   . 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 Tobacco abuse     Family History  Problem Relation Age of Onset  . Stroke Mother   . Aneurysm Mother   . Hypertension Mother   . Heart disease Father   . Hypertension Father   . Heart disease Brother   . Hypertension Brother   . Alcohol abuse Brother   . Diabetes Paternal Aunt   . Colon cancer Neg Hx     SOCIAL HISTORY: Social History   Socioeconomic History  . Marital status: Soil scientist    Spouse name: Judie Petit  . Number of children: 2  . Years of education: 15  . Highest education level: Not on file  Occupational History  . Occupation: disabled    Comment: heart  Social Needs  . Financial resource strain: Not on file  . Food insecurity    Worry: Not on file    Inability: Not on file  . Transportation needs    Medical: Not on file    Non-medical: Not on file  Tobacco Use  . Smoking status: Light Tobacco Smoker    Packs/day: 0.50    Years: 40.00    Pack years: 20.00    Types: Cigarettes    Start date: 07/14/1968  . Smokeless tobacco: Never Used  . Tobacco comment: 5-6 Cigarettes per day  Substance and Sexual Activity  . Alcohol use: Yes    Alcohol/week: 0.0 standard drinks    Comment: As of 11/26/17: social - 1-2 beers a sitting x 1-2 times a month  . Drug use: No  . Sexual activity: Not Currently  Lifestyle  . Physical activity    Days per week: Not on file    Minutes per session: Not on file  . Stress: Not on file  Relationships  . Social Herbalist on phone: Not on file    Gets together: Not on file    Attends religious service: Not on file    Active member of club or organization: Not on file    Attends meetings of clubs or organizations: Not on file    Relationship status: Not on file  . Intimate partner violence    Fear of current or ex partner: Not on file    Emotionally abused: Not on file    Physically abused: Not on file    Forced sexual  activity: Not on file  Other Topics Concern  . Not on file  Social History Narrative   Disabled from heart disease   Previously worked in Scientist, research (medical) with Judie Petit for 28 years   Leisure: computer    Allergies  Allergen Reactions  . Lisinopril Swelling    Swelling neck  .  Chantix [Varenicline] Other (See Comments)    Abdominal pain  . Neosporin [Neomycin-Bacitracin Zn-Polymyx] Rash    blistered    Current Outpatient Medications  Medication Sig Dispense Refill  . aspirin EC 81 MG tablet Take 81 mg by mouth at bedtime.     Marland Kitchen atorvastatin (LIPITOR) 40 MG tablet Take 1 tablet (40 mg total) by mouth daily. 90 tablet 3  . chlorthalidone (HYGROTON) 25 MG tablet TAKE 1 TABLET BY MOUTH DAILY. 30 tablet 6  . Cholecalciferol (VITAMIN D3) 2000 units TABS Take 1 tablet by mouth daily.    . cloNIDine (CATAPRES) 0.2 MG tablet Take 1 tablet (0.2 mg total) by mouth 3 (three) times daily. 270 tablet 3  . clopidogrel (PLAVIX) 75 MG tablet TAKE 1 TABLET BY MOUTH ONCE DAILY. 30 tablet 0  . isosorbide mononitrate (IMDUR) 30 MG 24 hr tablet Take 0.5 tablets (15 mg total) by mouth daily. 45 tablet 3  . metoprolol succinate (TOPROL-XL) 100 MG 24 hr tablet Take 100 mg by mouth daily. Take with or immediately following a meal.    . Multiple Vitamin (MULTIVITAMIN WITH MINERALS) TABS tablet Take 1 tablet by mouth daily.    . nitroGLYCERIN (NITROSTAT) 0.4 MG SL tablet Place 1 tablet (0.4 mg total) under the tongue every 5 (five) minutes as needed for chest pain. 25 tablet 3  . pantoprazole (PROTONIX) 40 MG tablet TAKE ONE TABLET BY MOUTH ONCE DAILY. 90 tablet 3  . potassium chloride SA (K-DUR,KLOR-CON) 20 MEQ tablet Take 1 tablet (20 mEq total) by mouth daily. 90 tablet 3   No current facility-administered medications for this visit.     REVIEW OF SYSTEMS:  [X]  denotes positive finding, [ ]  denotes negative finding Cardiac  Comments:  Chest pain or chest pressure: x  stable for many years.  Shortness  of breath upon exertion: x   Short of breath when lying flat:    Irregular heart rhythm:        Vascular    Pain in calf, thigh, or hip brought on by ambulation:    Pain in feet at night that wakes you up from your sleep:     Blood clot in your veins:    Leg swelling:         Pulmonary    Oxygen at home:    Productive cough:     Wheezing:         Neurologic    Sudden weakness in arms or legs:     Sudden numbness in arms or legs:     Sudden onset of difficulty speaking or slurred speech:    Temporary loss of vision in one eye:     Problems with dizziness:  x       Gastrointestinal    Blood in stool:     Vomited blood:         Genitourinary    Burning when urinating:     Blood in urine:        Psychiatric    Major depression:         Hematologic    Bleeding problems:    Problems with blood clotting too easily:        Skin    Rashes or ulcers:        Constitutional    Fever or chills:     PHYSICAL EXAM:   Vitals:   03/16/19 1515  BP: 126/78  Pulse: 85  Resp: 20  Temp: 97.6 F (36.4 C)  SpO2: 96%  Weight: 188 lb (85.3 kg)  Height: 5\' 9"  (1.753 m)    GENERAL: The patient is a well-nourished male, in no acute distress. The vital signs are documented above. CARDIAC: There is a regular rate and rhythm.  VASCULAR: I do not detect carotid bruits. On the right side he has a palpable femoral, popliteal, and posterior tibial pulse. On the left side he has a palpable femoral pulse I cannot palpate popliteal or pedal pulses on the left. Both feet are warm and well-perfused. He has no significant lower extremity swelling. PULMONARY: There is good air exchange bilaterally without wheezing or rales. ABDOMEN: Soft and non-tender with normal pitched bowel sounds.  I do not palpate an abdominal aortic aneurysm. MUSCULOSKELETAL: There are no major deformities or cyanosis. NEUROLOGIC: No focal weakness or paresthesias are detected. SKIN: There are no ulcers or rashes  noted. PSYCHIATRIC: The patient has a normal affect.  DATA:    ARTERIAL DOPPLER STUDY: I have independently interpreted his arterial Doppler study today.   On the right side he has a triphasic dorsalis pedis and posterior tibial signal.  ABI is 100%.  Toe pressures 123 mmHg.  On the left side he has a monophasic dorsalis pedis and posterior tibial signal.  ABI is 74%.  Toe pressure is 82 mmHg.  CT ANGIOGRAM NECK: I reviewed his CT angiogram of the neck that was done on 01/31/2019.  This showed evidence of a 70% left subclavian artery stenosis.  He had some atherosclerotic changes at the carotid bifurcation but no significant stenosis on either side.  There was a 50% stenosis at the origin of a dominant right vertebral artery.  LABS: His GFR in July of this year was greater than 60.  LDL cholesterol was 100  A total of 40 minutes was spent on this visit. 20 minutes was face to face time. More than 50% of the time was spent on counseling and coordinating with the patient.

## 2019-03-22 ENCOUNTER — Other Ambulatory Visit: Payer: Self-pay | Admitting: Cardiology

## 2019-03-22 DIAGNOSIS — E782 Mixed hyperlipidemia: Secondary | ICD-10-CM

## 2019-04-20 ENCOUNTER — Other Ambulatory Visit (INDEPENDENT_AMBULATORY_CARE_PROVIDER_SITE_OTHER): Payer: Self-pay | Admitting: Internal Medicine

## 2019-04-20 ENCOUNTER — Other Ambulatory Visit: Payer: Self-pay | Admitting: Cardiology

## 2019-05-11 ENCOUNTER — Other Ambulatory Visit: Payer: Self-pay

## 2019-05-11 ENCOUNTER — Ambulatory Visit (INDEPENDENT_AMBULATORY_CARE_PROVIDER_SITE_OTHER): Payer: Medicaid Other | Admitting: Cardiology

## 2019-05-11 ENCOUNTER — Encounter: Payer: Self-pay | Admitting: Cardiology

## 2019-05-11 VITALS — BP 161/78 | HR 90 | Temp 97.1°F | Ht 69.0 in | Wt 189.0 lb

## 2019-05-11 DIAGNOSIS — I10 Essential (primary) hypertension: Secondary | ICD-10-CM

## 2019-05-11 DIAGNOSIS — I2510 Atherosclerotic heart disease of native coronary artery without angina pectoris: Secondary | ICD-10-CM | POA: Diagnosis not present

## 2019-05-11 DIAGNOSIS — E782 Mixed hyperlipidemia: Secondary | ICD-10-CM | POA: Diagnosis not present

## 2019-05-11 MED ORDER — CHLORTHALIDONE 25 MG PO TABS: 25.0000 mg | ORAL_TABLET | Freq: Every day | ORAL | 3 refills | Status: DC

## 2019-05-11 MED ORDER — CLONIDINE HCL 0.2 MG PO TABS: 0.2000 mg | ORAL_TABLET | Freq: Two times a day (BID) | ORAL | 3 refills | Status: DC

## 2019-05-11 MED ORDER — AMLODIPINE BESYLATE 5 MG PO TABS: 5.0000 mg | ORAL_TABLET | Freq: Every day | ORAL | 3 refills | Status: DC

## 2019-05-11 NOTE — Progress Notes (Signed)
Clinical Summary [name] is a 63 y.o.male seen today for follow up of the following medical problems.    1. CAD  - cath 04/11/13 showed significant LAD and RCA disease, s/p DES to both - echo 03/2013 with normal LVEF  - compliant w/ meds including ASA and plavix  - seen at [facility] for chestpain 08/2016. LHC at that time showed only mild CAD along with patent stents.   - previouslyloweredimdur to 65m daily due to headaches, increasedToprol to 763mdaily. Has had some increased fatigue, poor sleep.No recent chest pain   - occasional chest pains midchest, Dull pain, flipping kind of filling. Lasts just a few seconds.  - compliant with meds     2. HL  - bad reaction to crestor, on only low dose atorvastatin 10 mg.  - 09/2016 TC 176 TG 204 HDL 46 LDL 89 - most recent labs with pcp  - he is on atorva 4082maily. LDL 100. TGs were 443, taking fish oil - side effects on higher dose statins in the past     3. HTN  - admit July 2020 with severe HTN, chest pain, stroke like symptoms - home bp's usually 120-160s/80s.  - ACEI alllergy with neck swelling, has not been on ARB due to cross reactivity risk.     4. History of GI bleed - followed by GI, from notes thought to be due to internal hemorroids.   5. PAD - followed by vascular - aniography 06/2017 without significant disease.  - left subclavian stenosis. Must check bp's in right arm  Past Medical History:  Diagnosis Date  . Allergy   . Anxiety   . [facility] [place] Coronary artery disease    a. 03/2013 Cath/PCI: LM nl, LAD 29m18m5x20 Promus Premier DES), LCX min irregs, RCA dom, 90p(3.0x20 Promus Premier DES), EF 55-65%.  . CVA (cerebral vascular accident) ([facility])[place]20/2020  . Essential hypertension, benign   . GERD (gastroesophageal reflux disease)   . Helicobacter pylori gastritis JUN 2016 EGD/Bx   PREVPAK BID FOR 14 DAYS  . Hepatitis    Hepatitis C treated   . Hiatal hernia   . History of  kidney stones   . Mixed hyperlipidemia   . Skin cancer   . Substance abuse ([facility])[place] alcoholic quit [id number] abuse      Allergies  Allergen Reactions  . Lisinopril Swelling    Swelling neck  . Chantix [Varenicline] Other (See Comments)    Abdominal pain  . Neosporin [Neomycin-Bacitracin Zn-Polymyx] Rash    blistered     Current Outpatient Medications  Medication Sig Dispense Refill  . aspirin EC 81 MG tablet Take 81 mg by mouth at bedtime.     . atMarland Kitchenrvastatin (LIPITOR) 40 MG tablet Take 1 tablet (40 mg total) by mouth daily. 90 tablet 3  . chlorthalidone (HYGROTON) 25 MG tablet TAKE 1 TABLET BY MOUTH DAILY. 30 tablet 6  . Cholecalciferol (VITAMIN D3) 2000 units TABS Take 1 tablet by mouth daily.    . cloNIDine (CATAPRES) 0.2 MG tablet Take 1 tablet (0.2 mg total) by mouth 3 (three) times daily. 270 tablet 3  . clopidogrel (PLAVIX) 75 MG tablet TAKE 1 TABLET BY MOUTH ONCE DAILY. 30 tablet 9  . isosorbide mononitrate (IMDUR) 30 MG 24 hr tablet Take 0.5 tablets (15 mg total) by mouth daily. 45 tablet 3  . metoprolol succinate (TOPROL-XL) 100 MG 24 hr tablet TAKE ONE TABLET  BY MOUTH ONCE DAILY. 30 tablet 3  . Multiple Vitamin (MULTIVITAMIN WITH MINERALS) TABS tablet Take 1 tablet by mouth daily.    . nitroGLYCERIN (NITROSTAT) 0.4 MG SL tablet Place 1 tablet (0.4 mg total) under the tongue every 5 (five) minutes as needed for chest pain. 25 tablet 3  . pantoprazole (PROTONIX) 40 MG tablet TAKE ONE TABLET BY MOUTH ONCE DAILY. 90 tablet 3  . potassium chloride SA (KLOR-CON) 20 MEQ tablet TAKE ONE TABLET BY MOUTH ONCE DAILY. 30 tablet 0   No current facility-administered medications for this visit.      Past Surgical History:  Procedure Laterality Date  . ABDOMINAL AORTOGRAM W/LOWER EXTREMITY N/A 06/15/2017   Procedure: ABDOMINAL AORTOGRAM W/LOWER EXTREMITY;  Surgeon: Angelia Mould, MD;  Location: [facility];  Service: Cardiovascular;  Laterality: N/A;   Bilateral  . BIOPSY N/A 12/19/2014   Procedure: BIOPSY;  Surgeon: Danie Binder, MD;  Location: [facility];  Service: Endoscopy;  Laterality: N/A;  . COLONOSCOPY  June 2016   [facility]: with endoscopic mucosal resection. 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 Needs colonoscopy in 1 year.   . COLONOSCOPY WITH PROPOFOL N/A 12/19/2014   Dr. Oneida Alar: six simple adenomas and 3 hyperplastic polyps. Had flat mid-transverse colon polyp and referred to [facility] for endoscopic mucosal resection, which is scheduled for July 22  . CORONARY ANGIOPLASTY WITH STENT PLACEMENT  04/11/2013   LAD &  RCA     DR COOPER  . ESOPHAGOGASTRODUODENOSCOPY (EGD) WITH PROPOFOL N/A 12/19/2014   Dr. Oneida Alar:  without varices, small hiatal hernia noted, moderate non-erosive gastritis and mild duodenitis.  POSITIVE H.PYLORI. Prescribed Prevpac.  Marland Kitchen FLEXIBLE SIGMOIDOSCOPY N/A 12/23/2017   Procedure: FLEXIBLE SIGMOIDOSCOPY;  Surgeon: Danie Binder, MD;  Location: [facility];  Service: Endoscopy;  Laterality: N/A;  1:45pm  . HAND RECONSTRUCTION Left   . HEMORRHOID BANDING N/A 12/23/2017   Procedure: Thayer Jew;  Surgeon: Danie Binder, MD;  Location: [facility];  Service: Endoscopy;  Laterality: N/A;  . LEFT HEART CATHETERIZATION WITH CORONARY ANGIOGRAM N/A 04/11/2013   Procedure: LEFT HEART CATHETERIZATION WITH CORONARY ANGIOGRAM;  Surgeon: Blane Ohara, MD;  Location: [facility];  Service: Cardiovascular;  Laterality: N/A;  . LEG TENDON SURGERY Right   . POLYPECTOMY N/A 12/19/2014   Procedure: POLYPECTOMY;  Surgeon: Danie Binder, MD;  Location: [facility];  Service: Endoscopy;  Laterality: N/A;     Allergies  Allergen Reactions  . Lisinopril Swelling    Swelling neck  . Chantix [Varenicline] Other (See Comments)    Abdominal pain  . Neosporin [Neomycin-Bacitracin Zn-Polymyx] Rash    blistered      Family History  Problem Relation Age of Onset  . Stroke Mother   . Aneurysm Mother   .  Hypertension Mother   . Heart disease Father   . Hypertension Father   . Heart disease Brother   . Hypertension Brother   . Alcohol abuse Brother   . Diabetes Paternal Aunt   . Colon cancer Neg Hx      Social History Mr. Credit reports that he has been smoking cigarettes. He started smoking about 50 years ago. He has a 20.00 pack-year smoking history. He has never used smokeless tobacco. Mr. Reichel reports current alcohol use.   Review of Systems CONSTITUTIONAL: No weight loss, fever, chills, weakness or fatigue.  HEENT: Eyes: No visual loss, blurred vision, double vision or yellow sclerae.No hearing loss, sneezing, congestion,  runny nose or sore throat.  SKIN: No rash or itching.  CARDIOVASCULAR: per hpi RESPIRATORY: No shortness of breath, cough or sputum.  GASTROINTESTINAL: No anorexia, nausea, vomiting or diarrhea. No abdominal pain or blood.  GENITOURINARY: No burning on urination, no polyuria NEUROLOGICAL: No headache, dizziness, syncope, paralysis, ataxia, numbness or tingling in the extremities. No change in bowel or bladder control.  MUSCULOSKELETAL: No muscle, back pain, joint pain or stiffness.  LYMPHATICS: No enlarged nodes. No history of splenectomy.  PSYCHIATRIC: No history of depression or anxiety.  ENDOCRINOLOGIC: No reports of sweating, cold or heat intolerance. No polyuria or polydipsia.  Marland Kitchen   Physical Examination Today's Vitals   05/11/19 1257  BP: (!) 161/78  Pulse: 90  Temp: (!) 97.1 F (36.2 C)  TempSrc: Temporal  Weight: 189 lb (85.7 kg)  Height: '5\' 9"'$  (1.753 m)   Body mass index is 27.91 kg/m.  Gen: resting comfortably, no acute distress HEENT: no scleral icterus, pupils equal round and reactive, no palptable cervical adenopathy,  CV: RRR, no m/r/g, no jvd Resp: Clear to auscultation bilaterally GI: abdomen is soft, non-tender, non-distended, normal bowel sounds, no hepatosplenomegaly MSK: extremities are warm, no edema.  Skin: warm, no  rash Neuro:  no focal deficits Psych: appropriate affect   Diagnostic Studies  04/11/13 Cath PROCEDURAL FINDINGS  Hemodynamics:  AO 178/93  LV 177/18  Coronary angiography:  Coronary dominance: right  Left mainstem: Widely patent without obstructive disease  Left anterior descending (LAD): severe mid-vessel stenosis (80%) between the first and second septal perforators. The vessel reaches the LV apex and there is no other significant disease noted. Large D1 is patent.  Left circumflex (LCx): small vessel, supplies 2 OM branches. Mild irregularity noted.  Right coronary artery (RCA): large, dominant vessel. There is severe 90% proximal stenosis. Irregularity in the mid-vessel without significant stenosis. The PDA and PLA branches are both large without significant disease.  Left ventriculography: Left ventricular systolic function is normal, LVEF is estimated at 55-65%, there is no significant mitral regurgitation  PCI Note: Following the diagnostic procedure, the decision was made to proceed with PCI. The patient was loaded with plavix 600 mg. Weight-based bivalirudin was given for anticoagulation. I planned on treating the LAD and RCA, both of which have high-grade disease. Once a therapeutic ACT was achieved, a 6 [place] XB-LAD guide catheter was inserted. A cougar coronary guidewire was used to cross the lesion. The lesion was predilated with a 2.0 mm balloon. The lesion was then stented with a 2.5x20 mm Promus Premier drug-eluting stent. The stent was postdilated with a 2.75 mm noncompliant balloon. Following PCI, there was 0% residual stenosis and TIMI-3 flow. Attention was then turned to the RCA. A JR-4 guide was used. The same cougar guidewire was used to cross the lesion. The lesion was dilated with a 2.0 mm balloon and stented with a 3.0x20 mm Promus Premier DES. The stent was post-dilated to 18 atm with a 3.25 mm [place] balloon. Final angiography confirmed an excellent result. The  patient tolerated the procedure well. There were no immediate procedural complications. A TR band was used for radial hemostasis. The patient was transferred to the post catheterization recovery area for further monitoring.  PCI Data:  Lesion 1  Vessel - LAD/Segment - mid  Percent Stenosis (pre) 80  TIMI-flow 3  Stent 2.5x20 mm Promus Premier DES  Percent Stenosis (post) 0  TIMI-flow (post) 3  Lesion 2  Vessel - RCA/Segment - prox  Percent Stenosis (pre) 90  TIMI-flow 3  Stent 3.0x20 mm Promus Premier DES  Percent Stenosis (post) 0  TIMI-flow (post) 3  Final Conclusions:  Severe 2 vessel CAD with successful PCI of the LAD and RCA  Normal LV function  Recommendations:  ASA and plavix for at least 12 months.   04/04/13 Echo LVEF 60-65%, no WMA, grade I diastolic dysfunction    02/5487 Exercise Nuclear Stress IMPRESSION: 1. No reversible ischemia or infarction. Submaximal stress test, thus diminishing the ability of this test to detect ischemia.  2. Normal left ventricular wall motion.  3. Left ventricular ejection fraction 78%  4. Low-risk stress test findings*. Consider Lexiscan in the future.   08/2016 [facility] cath Angiography: LVgram: not performed LCA: LAD stent patent, mild luminal irregularites, no significant obstructive disease RCA: proximal stent patent, large R dominant RCA,mild luminal irregularities in distal [place] Trembley  Impression:   Mild CAD with Patent Stents RCA and LAD Normal resting LVedp (14 mm)  Plan:  Follow-up with referring physician. Echocardiogram suggested to evaluate possible HFrEF as cause for symptoms    Assessment and Plan   1. CAD  - s/p DES to LAD and DES to RCA in setting of angina  - he has favored continuing plavix, which given his stent burden is reasonable  Recent cath 08/2016 [facility] without significant CAD  - no recnet angninal symptoms, we will continue current meds  2. HTN  - not at goal.  Unclear when he got on clonidine, but appears dose has been titrated over time and he is having some assoicated fatigue. Several other better options - start norvasc 90m daily, wean clonidine to 0.275mbid, bp log in 1 week, may wean clonidine even further. Room to titrate norvasc. Could consider aldactone as well. Angioedemaon ACEI in the past.    3. HL  - cntinue statin. LDL is reasonable given prior issues with statins in the past, has been tolerating atorva 4029maly   F/u 4 months  JonArnoldo Lenis.D.

## 2019-05-11 NOTE — Patient Instructions (Addendum)
Medication Instructions:  START NORVASC 5 MG DAILY   DECREASE CLONIDINE TO 0.2 MG - TWO TIMES DAILY   Labwork: NONE  Testing/Procedures: NONE  Follow-Up: Your physician recommends that you schedule a follow-up appointment in: 4 MONTHS    Any Other Special Instructions Will Be Listed Below (If Applicable).  PLEASE KEEP BLOOD PRESSURE LOG FOR 1 WEEK    If you need a refill on your cardiac medications before your next appointment, please call your pharmacy.

## 2019-06-08 ENCOUNTER — Other Ambulatory Visit: Payer: Self-pay | Admitting: Cardiology

## 2019-06-19 NOTE — Progress Notes (Signed)
REVIEWED-NO ADDITIONAL RECOMMENDATIONS. 

## 2019-06-23 ENCOUNTER — Other Ambulatory Visit: Payer: Self-pay

## 2019-06-23 ENCOUNTER — Ambulatory Visit (INDEPENDENT_AMBULATORY_CARE_PROVIDER_SITE_OTHER): Payer: Medicaid Other | Admitting: Internal Medicine

## 2019-06-23 ENCOUNTER — Encounter (INDEPENDENT_AMBULATORY_CARE_PROVIDER_SITE_OTHER): Payer: Self-pay | Admitting: Internal Medicine

## 2019-06-23 VITALS — BP 140/80 | HR 85 | Temp 98.1°F | Resp 18 | Ht 69.0 in | Wt 190.0 lb

## 2019-06-23 DIAGNOSIS — I2510 Atherosclerotic heart disease of native coronary artery without angina pectoris: Secondary | ICD-10-CM | POA: Diagnosis not present

## 2019-06-23 DIAGNOSIS — F17218 Nicotine dependence, cigarettes, with other nicotine-induced disorders: Secondary | ICD-10-CM

## 2019-06-23 DIAGNOSIS — R7303 Prediabetes: Secondary | ICD-10-CM

## 2019-06-23 DIAGNOSIS — Z23 Encounter for immunization: Secondary | ICD-10-CM

## 2019-06-23 DIAGNOSIS — E782 Mixed hyperlipidemia: Secondary | ICD-10-CM | POA: Diagnosis not present

## 2019-06-23 DIAGNOSIS — I10 Essential (primary) hypertension: Secondary | ICD-10-CM

## 2019-06-23 HISTORY — DX: Prediabetes: R73.03

## 2019-06-23 NOTE — Progress Notes (Signed)
Metrics: Intervention Frequency ACO  Documented Smoking Status Yearly  Screened one or more times in 24 months  Cessation Counseling or  Active cessation medication Past 24 months  Past 24 months   Guideline developer: UpToDate (See UpToDate for funding source) Date Released: 2014       Wellness Office Visit  Subjective:  Patient ID: Duane Adams, male    DOB: 11-26-1955  Age: 63 y.o. MRN: [id number]  CC: This man comes in for follow-up of hypertension, coronary artery disease, cerebrovascular disease, and prediabetes HPI Unfortunately, he has not lost weight and admits that he does not exercise and I am not sure he is following a consistent diet.  We did discuss this on the last visit. He continues on antihypertensive therapy and this seems to have controlled his blood pressure with changing in clonidine dose and adding amlodipine.  He denies any chest pain, dyspnea, palpitations or new limb weakness.  Past Medical History:  Diagnosis Date  . Allergy   . Anxiety   . Cirrhosis ([place])   . Coronary artery disease    a. 03/2013 Cath/PCI: LM nl, LAD 69m(2.5x20 Promus Premier DES), LCX min irregs, RCA dom, 90p(3.0x20 Promus Premier DES), EF 55-65%.  . [place]  . Essential hypertension, benign   . GERD (gastroesophageal reflux disease)   . Helicobacter pylori gastritis JUN 2016 EGD/Bx   PREVPAK BID FOR 14 DAYS  . Hepatitis    Hepatitis C treated   . Hiatal hernia   . History of kidney stones   . Mixed hyperlipidemia   . Prediabetes 06/23/2019  . Skin cancer   . Substance abuse ([place]    alcoholic quit 28088 . Tobacco abuse       Family History  Problem Relation Age of Onset  . Stroke Mother   . Aneurysm Mother   . Hypertension Mother   . Heart disease Father   . Hypertension Father   . Heart disease Brother   . Hypertension Brother   . Alcohol abuse Brother   . Diabetes Paternal Aunt   . 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 Smoking status: Light Tobacco Smoker    Packs/day: 0.50    Years: 40.00    Pack years: 20.00    Types: Cigarettes    Start date: 07/14/1968  . Smokeless tobacco: Never Used  . Tobacco comment: 5-6 Cigarettes per day  Substance Use Topics  . Alcohol use: Yes    Alcohol/week: 0.0 standard drinks    Comment: As of 11/26/17: social - 1-2 beers a sitting x 1-2 times a month    Current Meds  Medication Sig  . amLODipine (NORVASC) 5 MG tablet Take 1 tablet (5 mg total) by mouth daily.  .Marland Kitchenaspirin EC 81 MG tablet Take 81 mg by mouth at bedtime.   .Marland Kitchenatorvastatin (LIPITOR) 40 MG tablet Take 1 tablet (40 mg total) by mouth daily.  . chlorthalidone (HYGROTON) 25 MG tablet Take 1 tablet (25 mg total) by mouth daily.  . Cholecalciferol (VITAMIN D3) 2000 units TABS Take 1 tablet by mouth daily.  . cloNIDine (CATAPRES) 0.2 MG tablet Take 1 tablet (0.2 mg total) by mouth 2 (two) times daily.  . clopidogrel (PLAVIX)  75 MG tablet TAKE 1 TABLET BY MOUTH ONCE DAILY.  . isosorbide mononitrate (IMDUR) 30 MG 24 hr tablet Take 0.5 tablets (15 mg total) by mouth daily.  . metoprolol succinate (TOPROL-XL) 100 MG 24 hr tablet TAKE ONE TABLET BY MOUTH ONCE DAILY.  . Multiple Vitamin (MULTIVITAMIN WITH MINERALS) TABS tablet Take 1 tablet by mouth daily.  . nitroGLYCERIN (NITROSTAT) 0.4 MG SL tablet Place 1 tablet (0.4 mg total) under the tongue every 5 (five) minutes as needed for chest pain.  . pantoprazole (PROTONIX) 40 MG tablet TAKE ONE TABLET BY MOUTH ONCE DAILY.  Marland Kitchen potassium chloride SA (KLOR-CON) 20 MEQ tablet TAKE ONE TABLET BY MOUTH ONCE DAILY.      Objective:   Today's Vitals: BP 140/80 (BP Location: Left Arm, Patient Position: Sitting, Cuff Size: Normal)   Pulse 85   Temp 98.1 F (36.7 C) (Temporal)   Resp 18    Ht _0  (1.753 m)   Wt 190 lb (86.2 kg)   SpO2 99% Comment: wearing mask.  BMI 28.06 kg/m  Vitals with BMI 06/23/2019 05/11/2019 03/16/2019  Height _1  _2  _3   Weight 190 lbs 189 lbs 188 lbs  BMI 28.05 80.0 44.71  Systolic 580 638 685  Diastolic 80 78 78  Pulse 85 90 85     Physical Exam  His blood pressure is borderline controlled today.  He has gained weight since the last time I saw him.  He is alert and orientated without any focal neurological signs.     Assessment   1. Essential hypertension   2. Coronary artery disease involving native coronary artery of native heart without angina pectoris   3. Mixed hyperlipidemia   4. Nicotine dependence, cigarettes, with other nicotine-induced disorders   5. Prediabetes       Tests ordered Orders Placed This Encounter  Procedures  . CMP with eGFR([facility])  . Hemoglobin A1c     Plan: 1. Blood work is ordered as above. 2. He will continue with antihypertensive therapy above which seems to have led to improved blood pressure control. 3. He will continue with statin therapy for his hyperlipidemia in the face of coronary artery disease. 4. He says he has reduced the amount of cigarettes he smokes in a day.  Hopefully he will continue to progress and quit smoking.  We discussed this briefly. 5. Further recommendations will depend on blood results.  He was given influenza vaccination today. 6. Follow-up with Judson Roch in 3 months time.   No orders of the defined types were placed in this encounter.   Doree Albee, MD

## 2019-06-24 LAB — HEMOGLOBIN A1C
Hgb A1c MFr Bld: 6.2 % of total Hgb — ABNORMAL HIGH (ref <5.7)
Mean Plasma Glucose: 131 (calc)
eAG (mmol/L): 7.3 (calc)

## 2019-06-24 LAB — COMPLETE METABOLIC PANEL WITH GFR
AG Ratio: 1.7 (calc) (ref 1.0–2.5)
ALT: 29 U/L (ref 9–46)
AST: 26 U/L (ref 10–35)
Albumin: 4.4 g/dL (ref 3.6–5.1)
Alkaline phosphatase (APISO): 54 U/L (ref 35–144)
BUN: 10 mg/dL (ref 7–25)
CO2: 24 mmol/L (ref 20–32)
Calcium: 9.4 mg/dL (ref 8.6–10.3)
Chloride: 102 mmol/L (ref 98–110)
Creat: 0.77 mg/dL (ref 0.70–1.25)
GFR, Est African American: 113 mL/min/{1.73_m2} (ref >=60)
GFR, Est Non African American: 97 mL/min/{1.73_m2} (ref >=60)
Globulin: 2.6 g/dL (calc) (ref 1.9–3.7)
Glucose, Bld: 153 mg/dL — ABNORMAL HIGH (ref 65–99)
Potassium: 3.6 mmol/L (ref 3.5–5.3)
Sodium: 139 mmol/L (ref 135–146)
Total Bilirubin: 0.4 mg/dL (ref 0.2–1.2)
Total Protein: 7 g/dL (ref 6.1–8.1)

## 2019-07-18 ENCOUNTER — Other Ambulatory Visit (INDEPENDENT_AMBULATORY_CARE_PROVIDER_SITE_OTHER): Payer: Self-pay | Admitting: Internal Medicine

## 2019-09-20 ENCOUNTER — Other Ambulatory Visit (INDEPENDENT_AMBULATORY_CARE_PROVIDER_SITE_OTHER): Payer: Self-pay | Admitting: Internal Medicine

## 2019-09-26 ENCOUNTER — Encounter (INDEPENDENT_AMBULATORY_CARE_PROVIDER_SITE_OTHER): Payer: Self-pay | Admitting: Nurse Practitioner

## 2019-09-26 ENCOUNTER — Other Ambulatory Visit: Payer: Self-pay

## 2019-09-26 ENCOUNTER — Ambulatory Visit (INDEPENDENT_AMBULATORY_CARE_PROVIDER_SITE_OTHER): Payer: Medicaid Other | Admitting: Nurse Practitioner

## 2019-09-26 VITALS — BP 140/85 | HR 86 | Temp 97.6°F | Ht 69.0 in | Wt 185.6 lb

## 2019-09-26 DIAGNOSIS — G4700 Insomnia, unspecified: Secondary | ICD-10-CM | POA: Diagnosis not present

## 2019-09-26 DIAGNOSIS — E782 Mixed hyperlipidemia: Secondary | ICD-10-CM

## 2019-09-26 DIAGNOSIS — I10 Essential (primary) hypertension: Secondary | ICD-10-CM

## 2019-09-26 DIAGNOSIS — R7303 Prediabetes: Secondary | ICD-10-CM | POA: Diagnosis not present

## 2019-09-26 NOTE — Patient Instructions (Signed)
Melatonin 1-5mg  by mouth at night as needed.

## 2019-09-26 NOTE — Progress Notes (Signed)
Subjective:  Patient ID: Duane Adams, male    DOB: 1955/11/06  Age: 64 y.o. MRN: [id number]  CC:  Chief Complaint  Patient presents with  . Hypertension  . Follow-up    Hyperlipidemia, prediabetes  . Other    insomnia      HPI  This patient comes in today for the above.  Hypertension: He continues on all his antihypertensives as prescribed.  He tells me he has not taken them yet this morning.  He is tolerating these well.  Last metabolic panel was collected in December 2020 which showed normal renal function and potassium level.  Hyperlipidemia: He continues on aspirin and atorvastatin.  He tells me he started taking an omega-3 supplement approximately 2 months ago.  Last lipid panel was collected in July 2020 and showed triglyceride level of 443.  LDL was not able to be calculated.  Prediabetes: Last A1c was collected in December 2020 and it was 6.2.  He tells me he has been really focusing on reducing carbohydrate intake.  Insomnia: He mentions to me that he often has a hard time staying asleep.  He will fall asleep without difficulty, but will often wake up in the night and have a hard time falling back to sleep.  He does have a family history of sleep apnea.  He denies snoring on a regular basis.  He does admit to snoring if he drinks any alcohol.  He does feel sleepy during the day.  He does not feel like he wakes up gasping for air at night.  He denies ever having been told he has had witnessed apneic episodes overnight.   Past Medical History:  Diagnosis Date  . Allergy   . Anxiety   . Cirrhosis ([place])   . Coronary artery disease    a. 03/2013 Cath/PCI: LM nl, LAD 81m (2.5x20 Promus Premier DES), LCX min irregs, RCA dom, 90p(3.0x20 Promus Premier DES), EF 55-65%.  . CVA (cerebral vascular accident) ([place]) 01/31/2019  . Essential hypertension, benign   . GERD (gastroesophageal reflux disease)   . Helicobacter pylori gastritis JUN 2016 EGD/Bx   PREVPAK BID FOR 14  DAYS  . Hepatitis    Hepatitis C treated   . Hiatal hernia   . History of kidney stones   . Mixed hyperlipidemia   . Prediabetes 06/23/2019  . Skin cancer   . Substance abuse ([place])    alcoholic quit Q000111Q  . Tobacco abuse       Family History  Problem Relation Age of Onset  . Stroke Mother   . Aneurysm Mother   . Hypertension Mother   . Heart disease Father   . Hypertension Father   . Heart disease Brother   . Hypertension Brother   . Alcohol abuse Brother   . Diabetes Paternal Aunt   . 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 Smoking status: Light Tobacco Smoker    Packs/day: 0.50    Years: 40.00    Pack years: 20.00    Types: Cigarettes    Start date: 07/14/1968  . Smokeless tobacco: Never Used  . Tobacco comment: 5-6 Cigarettes per day  Substance Use Topics  . Alcohol use: Yes    Alcohol/week: 0.0 standard drinks  Comment: As of 11/26/17: social - 1-2 beers a sitting x 1-2 times a month     Current Meds  Medication Sig  . aspirin EC 81 MG tablet Take 81 mg by mouth at bedtime.   Marland Kitchen atorvastatin (LIPITOR) 40 MG tablet TAKE ONE TABLET BY MOUTH ONCE DAILY.  . chlorthalidone (HYGROTON) 25 MG tablet Take 1 tablet (25 mg total) by mouth daily.  . Cholecalciferol (VITAMIN D3) 2000 units TABS Take 1 tablet by mouth daily.  . cloNIDine (CATAPRES) 0.2 MG tablet Take 1 tablet (0.2 mg total) by mouth 2 (two) times daily.  . clopidogrel (PLAVIX) 75 MG tablet TAKE 1 TABLET BY MOUTH ONCE DAILY.  . isosorbide mononitrate (IMDUR) 30 MG 24 hr tablet Take 0.5 tablets (15 mg total) by mouth daily.  . metoprolol succinate (TOPROL-XL) 100 MG 24 hr tablet TAKE ONE TABLET BY MOUTH ONCE DAILY.  . Multiple Vitamin (MULTIVITAMIN WITH MINERALS) TABS tablet Take 1 tablet by mouth daily.  . nitroGLYCERIN  (NITROSTAT) 0.4 MG SL tablet Place 1 tablet (0.4 mg total) under the tongue every 5 (five) minutes as needed for chest pain.  . Omega-3 1000 MG CAPS Take 1 capsule by mouth daily.  . pantoprazole (PROTONIX) 40 MG tablet TAKE ONE TABLET BY MOUTH ONCE DAILY.  Marland Kitchen potassium chloride SA (KLOR-CON) 20 MEQ tablet TAKE ONE TABLET BY MOUTH ONCE DAILY.    ROS:  Review of Systems  Respiratory: Negative.   Cardiovascular: Negative.   Psychiatric/Behavioral: The patient has insomnia.      Objective:   Today's Vitals: BP 140/85 (BP Location: Right Arm, Patient Position: Sitting, Cuff Size: Normal)   Pulse 86   Temp 97.6 F (36.4 C) (Temporal)   Ht 5\' 9"  (1.753 m)   Wt 185 lb 9.6 oz (84.2 kg)   SpO2 97%   BMI 27.41 kg/m  Vitals with BMI 09/26/2019 06/23/2019 05/11/2019  Height 5\' 9"  5\' 9"  5\' 9"   Weight 185 lbs 10 oz 190 lbs 189 lbs  BMI 27.4 [id number]  Systolic [id number]  Diastolic 85 80 78  Pulse 86 85 90     Physical Exam Vitals reviewed.  Constitutional:      Appearance: Normal appearance.  HENT:     Head: Normocephalic and atraumatic.  Cardiovascular:     Rate and Rhythm: Normal rate and regular rhythm.  Pulmonary:     Effort: Pulmonary effort is normal.     Breath sounds: Normal breath sounds.  Musculoskeletal:     Cervical back: Neck supple.  Skin:    General: Skin is warm and dry.  Neurological:     Mental Status: He is alert and oriented to person, place, and time.  Psychiatric:        Mood and Affect: Mood normal.        Behavior: Behavior normal.        Thought Content: Thought content normal.        Judgment: Judgment normal.          Assessment and Plan   1. Essential hypertension   2. Mixed hyperlipidemia   3. Prediabetes   4. Insomnia, unspecified type      Plan: 1.,  2.,  3.  He will continue on current medication regimen as prescribed.  I encouraged him to continue trying to follow a lower carbohydrate diet.  I also encouraged him to focus  on whole foods especially fresh vegetables.  We will consider collecting blood work at next office visit,  including lipid panel and A1c.  4.  I offered to refer him to neurology for evaluation of possible sleep apnea.  For now he would like to hold off on this referral but would like to be given recommendations regarding supplementation to help with sleep.  I recommended he try melatonin.  We also talked about sleep hygiene and the importance of having a regular set bedtime and wake up time, avoiding screens for at least 90 minutes before bedtime, and avoiding any screens if he were to wake up overnight.  He was encouraged to call the office if he continues to have issues with sleeping.   Tests ordered No orders of the defined types were placed in this encounter.     No orders of the defined types were placed in this encounter.   Patient to follow-up in 3 months  Ailene Ards, NP

## 2019-10-10 ENCOUNTER — Other Ambulatory Visit: Payer: Self-pay | Admitting: *Deleted

## 2019-10-10 DIAGNOSIS — I7409 Other arterial embolism and thrombosis of abdominal aorta: Secondary | ICD-10-CM

## 2019-10-10 DIAGNOSIS — I739 Peripheral vascular disease, unspecified: Secondary | ICD-10-CM

## 2019-10-10 DIAGNOSIS — I714 Abdominal aortic aneurysm, without rupture, unspecified: Secondary | ICD-10-CM

## 2019-10-10 DIAGNOSIS — I6523 Occlusion and stenosis of bilateral carotid arteries: Secondary | ICD-10-CM

## 2019-10-10 DIAGNOSIS — I639 Cerebral infarction, unspecified: Secondary | ICD-10-CM

## 2019-10-11 ENCOUNTER — Telehealth (HOSPITAL_COMMUNITY): Payer: Self-pay

## 2019-10-11 NOTE — Telephone Encounter (Signed)

## 2019-10-12 ENCOUNTER — Ambulatory Visit (INDEPENDENT_AMBULATORY_CARE_PROVIDER_SITE_OTHER)
Admission: RE | Admit: 2019-10-12 | Discharge: 2019-10-12 | Disposition: A | Discharge status: Home / Self Care | Payer: Medicaid Other | Source: Ambulatory Visit | Attending: Vascular Surgery | Admitting: Vascular Surgery

## 2019-10-12 ENCOUNTER — Other Ambulatory Visit: Payer: Self-pay

## 2019-10-12 ENCOUNTER — Encounter: Payer: Self-pay | Admitting: Vascular Surgery

## 2019-10-12 ENCOUNTER — Ambulatory Visit (HOSPITAL_COMMUNITY)
Admission: RE | Admit: 2019-10-12 | Discharge: 2019-10-12 | Disposition: A | Discharge status: Home / Self Care | Payer: Medicaid Other | Source: Ambulatory Visit | Attending: Vascular Surgery | Admitting: Vascular Surgery

## 2019-10-12 ENCOUNTER — Ambulatory Visit (INDEPENDENT_AMBULATORY_CARE_PROVIDER_SITE_OTHER): Payer: Medicaid Other | Admitting: Vascular Surgery

## 2019-10-12 VITALS — BP 110/68 | HR 72 | Temp 97.4°F | Resp 20 | Ht 69.0 in | Wt 183.0 lb

## 2019-10-12 DIAGNOSIS — I6523 Occlusion and stenosis of bilateral carotid arteries: Secondary | ICD-10-CM | POA: Insufficient documentation

## 2019-10-12 DIAGNOSIS — I739 Peripheral vascular disease, unspecified: Secondary | ICD-10-CM

## 2019-10-12 DIAGNOSIS — I714 Abdominal aortic aneurysm, without rupture, unspecified: Secondary | ICD-10-CM

## 2019-10-12 DIAGNOSIS — I7409 Other arterial embolism and thrombosis of abdominal aorta: Secondary | ICD-10-CM

## 2019-10-12 DIAGNOSIS — I771 Stricture of artery: Secondary | ICD-10-CM | POA: Diagnosis not present

## 2019-10-12 DIAGNOSIS — I639 Cerebral infarction, unspecified: Secondary | ICD-10-CM | POA: Insufficient documentation

## 2019-10-12 NOTE — Progress Notes (Signed)
Patient name: Duane Adams MRN: [id number] DOB: April 03, 1956 Sex: male  REASON FOR VISIT:   Follow-up of peripheral vascular disease.  HPI:   Duane Adams is a pleasant 64 y.o. male who I last saw on 03/16/2019.  The patient has a history of peripheral vascular disease.  ABI on the left was 74% at that time.  However his symptoms he was having in his left leg did not totally fit with symptoms from peripheral vascular disease.  He describes burning pain in his hips brought on by ambulation on both sides although his circulation on the right is normal.  He did have a left common iliac artery stent in December 2018.  At that point I did not recommend proceeding with arteriography and he comes in for 23-month follow-up visit.  In addition the patient had undergone a CT angiogram of the neck from an admission in July and was found to have a 70% left subclavian stenosis.  He did not have any vertebrobasilar symptoms.  I recommended a follow-up carotid duplex scan in 6 months.  Since I saw him last, he continues to have burning pain mostly in his hips which is associated with ambulation relieved with rest.  His symptoms are equal on both sides.  I do not get any history of calf claudication, rest pain, or nonhealing ulcers.  He tells me that he had a slight stroke 6 months ago associated with dizziness and some slurred speech.  He is on aspirin, Plavix, and a statin.  He does continue to smoke a pack per day of cigarettes and has been smoking for 45 years.  His risk factors for peripheral vascular disease include hypertension, hypercholesterolemia, a family history of premature cardiovascular disease, and smoking.  He denies any history of diabetes.  He has had no problems with dizziness recently.  Past Medical History:  Diagnosis Date  . Allergy   . Anxiety   . Cirrhosis ([place])   . Coronary artery disease    a. 03/2013 Cath/PCI: LM nl, LAD 29m (2.5x20 Promus Premier DES), LCX min irregs, RCA  dom, 90p(3.0x20 Promus Premier DES), EF 55-65%.  . CVA (cerebral vascular accident) ([place]) 01/31/2019  . Essential hypertension, benign   . GERD (gastroesophageal reflux disease)   . Helicobacter pylori gastritis JUN 2016 EGD/Bx   PREVPAK BID FOR 14 DAYS  . Hepatitis    Hepatitis C treated   . Hiatal hernia   . History of kidney stones   . Mixed hyperlipidemia   . Prediabetes 06/23/2019  . Skin cancer   . Substance abuse ([place])    alcoholic quit Q000111Q  . Tobacco abuse     Family History  Problem Relation Age of Onset  . Stroke Mother   . Aneurysm Mother   . Hypertension Mother   . Heart disease Father   . Hypertension Father   . Heart disease Brother   . Hypertension Brother   . Alcohol abuse Brother   . Diabetes Paternal Aunt   . Colon cancer Neg Hx     SOCIAL HISTORY: Social History   Tobacco Use  . Smoking status: Light Tobacco Smoker    Packs/day: 0.50    Years: 40.00    Pack years: 20.00    Types: Cigarettes    Start date: 07/14/1968  . Smokeless tobacco: Never Used  . Tobacco comment: 5-6 Cigarettes per day  Substance Use Topics  . Alcohol use: Yes    Alcohol/week: 0.0 standard drinks  Comment: As of 11/26/17: social - 1-2 beers a sitting x 1-2 times a month    Allergies  Allergen Reactions  . Lisinopril Swelling    Swelling neck  . Chantix [Varenicline] Other (See Comments)    Abdominal pain  . Neosporin [Neomycin-Bacitracin Zn-Polymyx] Rash    blistered    Current Outpatient Medications  Medication Sig Dispense Refill  . amLODipine (NORVASC) 5 MG tablet Take 1 tablet (5 mg total) by mouth daily. 90 tablet 3  . aspirin EC 81 MG tablet Take 81 mg by mouth at bedtime.     Marland Kitchen atorvastatin (LIPITOR) 40 MG tablet TAKE ONE TABLET BY MOUTH ONCE DAILY. 30 tablet 3  . chlorthalidone (HYGROTON) 25 MG tablet Take 1 tablet (25 mg total) by mouth daily. 90 tablet 3  . Cholecalciferol (VITAMIN D3) 2000 units TABS Take 1 tablet by mouth daily.    . cloNIDine  (CATAPRES) 0.2 MG tablet Take 1 tablet (0.2 mg total) by mouth 2 (two) times daily. 180 tablet 3  . clopidogrel (PLAVIX) 75 MG tablet TAKE 1 TABLET BY MOUTH ONCE DAILY. 30 tablet 9  . isosorbide mononitrate (IMDUR) 30 MG 24 hr tablet Take 0.5 tablets (15 mg total) by mouth daily. 45 tablet 3  . metoprolol succinate (TOPROL-XL) 100 MG 24 hr tablet TAKE ONE TABLET BY MOUTH ONCE DAILY. 30 tablet 0  . Multiple Vitamin (MULTIVITAMIN WITH MINERALS) TABS tablet Take 1 tablet by mouth daily.    . nitroGLYCERIN (NITROSTAT) 0.4 MG SL tablet Place 1 tablet (0.4 mg total) under the tongue every 5 (five) minutes as needed for chest pain. 25 tablet 3  . Omega-3 1000 MG CAPS Take 1 capsule by mouth daily.    . pantoprazole (PROTONIX) 40 MG tablet TAKE ONE TABLET BY MOUTH ONCE DAILY. 90 tablet 3  . potassium chloride SA (KLOR-CON) 20 MEQ tablet TAKE ONE TABLET BY MOUTH ONCE DAILY. 30 tablet 11   No current facility-administered medications for this visit.    REVIEW OF SYSTEMS:  [X]  denotes positive finding, [ ]  denotes negative finding Cardiac  Comments:  Chest pain or chest pressure:    Shortness of breath upon exertion: x   Short of breath when lying flat:    Irregular heart rhythm:        Vascular    Pain in calf, thigh, or hip brought on by ambulation: x   Pain in feet at night that wakes you up from your sleep:     Blood clot in your veins:    Leg swelling:         Pulmonary    Oxygen at home:    Productive cough:     Wheezing:         Neurologic    Sudden weakness in arms or legs:     Sudden numbness in arms or legs:     Sudden onset of difficulty speaking or slurred speech:    Temporary loss of vision in one eye:     Problems with dizziness:         Gastrointestinal    Blood in stool:     Vomited blood:         Genitourinary    Burning when urinating:     Blood in urine:        Psychiatric    Major depression:         Hematologic    Bleeding problems:    Problems with blood  clotting too easily:  Skin    Rashes or ulcers:        Constitutional    Fever or chills:     PHYSICAL EXAM:   Vitals:   10/12/19 1113 10/12/19 1116  BP: 102/66 110/68  Pulse: 72   Resp: 20   Temp: (!) 97.4 F (36.3 C)   SpO2: 96%   Weight: 183 lb (83 kg)   Height: 5\' 9"  (1.753 m)     GENERAL: The patient is a well-nourished male, in no acute distress. The vital signs are documented above. CARDIAC: There is a regular rate and rhythm.  VASCULAR: I do not detect carotid bruits. He has normal femoral pulses. He has palpable right posterior tibial pulse. I cannot palpate pedal pulses in the left foot however both feet are warm and well perfused. He has no significant lower extremity swelling. PULMONARY: There is good air exchange bilaterally without wheezing or rales. ABDOMEN: Soft and non-tender with normal pitched bowel sounds.  MUSCULOSKELETAL: There are no major deformities or cyanosis. NEUROLOGIC: No focal weakness or paresthesias are detected. SKIN: There are no ulcers or rashes noted. PSYCHIATRIC: The patient has a normal affect.  DATA:    ARTERIAL DOPPLER STUDY: I have independently interpreted his arterial Doppler study today.  On the right side, there is a biphasic dorsalis pedis and posterior tibial signal.  ABI is 100%.  Toe pressure is 116 mmHg.  On the left side, there is a monophasic dorsalis pedis signal.  There is a biphasic posterior tibial signal.  ABI is 84%.  Toe pressures 57 mmHg.  DUPLEX ABDOMINAL AORTA: I have independently interpreted his duplex of the abdominal aorta.  The aorta is not dilated.  He has biphasic flow.  The iliac arteries could not be identified.  CAROTID DUPLEX: I have independently interpreted his carotid duplex scan.  On the right side he has a less than 39% stenosis.  The right vertebral artery is patent with antegrade flow.  On the left side there is a less than 39% stenosis.  The left vertebral artery demonstrates  bidirectional flow.  The patient does have a known left subclavian artery stenosis with elevated velocities on the left.  MEDICAL ISSUES:   PERIPHERAL VASCULAR DISEASE: He has evidence of infrainguinal arterial occlusive disease on the left.  He has no evidence of significant arterial insufficiency on the right.  Yet, his symptoms are in both hips so I do not think these can be attributed to his peripheral vascular disease.  I suspect he has back issues.  He did have a back injury 30 years ago.  We have previously discussed the option of arteriography to look for some underlying aortoiliac disease however I think the yield from this would be fairly low and we both agree we will hold off on arteriography for now.  We have again discussed the importance of tobacco cessation.  I encouraged him to stay as active as possible.  I reassured him today that his carotid duplex scan showed no evidence of carotid disease.  I have ordered follow-up ABIs in 1 year and I will see him back at that time.  He knows to call sooner if he has problems.  [name] [facility] [phone]

## 2019-10-13 ENCOUNTER — Other Ambulatory Visit: Payer: Self-pay | Admitting: *Deleted

## 2019-10-13 DIAGNOSIS — I739 Peripheral vascular disease, unspecified: Secondary | ICD-10-CM

## 2019-10-24 ENCOUNTER — Other Ambulatory Visit (INDEPENDENT_AMBULATORY_CARE_PROVIDER_SITE_OTHER): Payer: Self-pay | Admitting: Internal Medicine

## 2019-10-24 ENCOUNTER — Other Ambulatory Visit: Payer: Self-pay | Admitting: Gastroenterology

## 2019-10-26 ENCOUNTER — Other Ambulatory Visit: Payer: Self-pay | Admitting: Gastroenterology

## 2019-11-24 ENCOUNTER — Other Ambulatory Visit (INDEPENDENT_AMBULATORY_CARE_PROVIDER_SITE_OTHER): Payer: Self-pay | Admitting: Internal Medicine

## 2019-12-20 ENCOUNTER — Other Ambulatory Visit (INDEPENDENT_AMBULATORY_CARE_PROVIDER_SITE_OTHER): Payer: Self-pay | Admitting: Internal Medicine

## 2020-01-02 ENCOUNTER — Other Ambulatory Visit: Payer: Self-pay

## 2020-01-02 ENCOUNTER — Ambulatory Visit (INDEPENDENT_AMBULATORY_CARE_PROVIDER_SITE_OTHER): Payer: Medicaid Other | Admitting: Nurse Practitioner

## 2020-01-02 ENCOUNTER — Encounter (INDEPENDENT_AMBULATORY_CARE_PROVIDER_SITE_OTHER): Payer: Self-pay | Admitting: Nurse Practitioner

## 2020-01-02 ENCOUNTER — Other Ambulatory Visit: Payer: Self-pay | Admitting: Cardiology

## 2020-01-02 VITALS — BP 140/75 | HR 92 | Temp 97.3°F | Ht 69.0 in | Wt 181.8 lb

## 2020-01-02 DIAGNOSIS — M25551 Pain in right hip: Secondary | ICD-10-CM

## 2020-01-02 DIAGNOSIS — E782 Mixed hyperlipidemia: Secondary | ICD-10-CM

## 2020-01-02 DIAGNOSIS — R7303 Prediabetes: Secondary | ICD-10-CM

## 2020-01-02 DIAGNOSIS — I10 Essential (primary) hypertension: Secondary | ICD-10-CM

## 2020-01-02 DIAGNOSIS — K7460 Unspecified cirrhosis of liver: Secondary | ICD-10-CM | POA: Diagnosis not present

## 2020-01-02 DIAGNOSIS — M25552 Pain in left hip: Secondary | ICD-10-CM | POA: Diagnosis not present

## 2020-01-02 NOTE — Progress Notes (Signed)
Subjective:  Patient ID: Duane Adams, male    DOB: 1956/06/02  Age: 64 y.o. MRN: [id number]  CC:  Chief Complaint  Patient presents with  . Hypertension  . Follow-up  . Hyperlipidemia  . Other    Cirrhosis, prediabetes, bilateral hip pain      HPI  This patient arrives today for the above.  Hypertension: He continues on his antihypertensives including amlodipine, chlorthalidone, clonidine, Imdur, and metoprolol.  He is tolerating this well.  He tells me he did smoke a cigarette approximately 30 minutes prior to coming into the office today.  Hyperlipidemia: Last lipid panel was collected in July 2020.  He continues on his omega-3, atorvastatin, and aspirin. Lipid Panel         Component                Value               Date/Time                 CHOL                     184                 02/01/2019 0425           TRIG                     443 (H)             02/01/2019 0425           HDL                      39 (L)              02/01/2019 0425           CHOLHDL                  4.7                 02/01/2019 0425           VLDL                                         02/01/2019 0425       UNABLE TO CALCULATE IF TRIGLYCERIDE OVER 400 mg/dL      LDLCALC                                      02/01/2019 0425       UNABLE TO CALCULATE IF TRIGLYCERIDE OVER 400 mg/dL      LDLCALC                  106 (H)             05/05/2017 1135           LDLDIRECT                100.2 (H)           02/01/2019 0425         Cirrhosis: He is up-to-date with PCV 13 and flu vaccinations.  Is not interested in getting the COVID-19 shot.  He would be  due for Pneumovax vaccine today.  He still does drink beer periodically and smokes cigarettes.  Last metabolic panel showed normal alk phos and albumin.  Prediabetes: Last A1c was 6.2.  He has been trying to reduce his intake of processed carbohydrates.  Bilateral Hip Pain: He has been experiencing bilateral hip pain for approximately 10 years.   Tells me is been getting progressively worse.  He describes the pain as an ache and burning sensation.  He has been evaluated by vascular surgery because the pain only occurs with activity, but was told that his blood flow is normal.  He is concerned that he may have problem with his nerves and would like to be seen by neurologist for further evaluation.  He does not take any medication to relieve the pain, but tells me with rest the pain subsides.  Per chart review I do see that he had x-ray of bilateral hips back in 2013 which did not show any bony abnormalities.     Past Medical History:  Diagnosis Date  . Allergy   . Anxiety   . Cirrhosis ([place])   . Coronary artery disease    a. 03/2013 Cath/PCI: LM nl, LAD 75m(2.5x20 Promus Premier DES), LCX min irregs, RCA dom, 90p(3.0x20 Promus Premier DES), EF 55-65%.  . [place]re[place]  . Essential hypertension, benign   . GERD (gastroesophageal reflux disease)   . Helicobacter pylori gastritis JUN 2016 EGD/Bx   PREVPAK BID FOR 14 DAYS  . Hepatitis    Hepatitis C treated   . Hiatal hernia   . History of kidney stones   . Mixed hyperlipidemia   . Prediabetes 06/23/2019  . Skin cancer   . Substance abuse ([place]    alcoholic quit 21610 . Tobacco abuse       Family History  Problem Relation Age of Onset  . Stroke Mother   . Aneurysm Mother   . Hypertension Mother   . Heart disease Father   . Hypertension Father   . Heart disease Brother   . Hypertension Brother   . Alcohol abuse Brother   . Diabetes Paternal Aunt   . 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 Smoking status: Light Tobacco Smoker    Packs/day: 0.50    Years: 40.00    Pack years: 20.00    Types: Cigarettes    Start date: 07/14/1968  . Smokeless tobacco:  Never Used  . Tobacco comment: 5-6 Cigarettes per day  Substance Use Topics  . Alcohol use: Yes    Alcohol/week: 0.0 standard drinks    Comment: As of 11/26/17: social - 1-2 beers a sitting x 1-2 times a month     Current Meds  Medication Sig  . amLODipine (NORVASC) 5 MG tablet Take 1 tablet (5 mg total) by mouth daily.  .Marland Kitchenaspirin EC 81 MG tablet Take 81 mg by mouth at bedtime.   .Marland Kitchenatorvastatin (LIPITOR) 40 MG tablet TAKE ONE TABLET BY MOUTH ONCE DAILY.  . chlorthalidone (HYGROTON) 25 MG tablet Take 1 tablet (25 mg total) by mouth daily.  . Cholecalciferol (VITAMIN D3) 2000 units TABS Take 1 tablet by mouth daily.  . cloNIDine (CATAPRES) 0.2 MG tablet Take 1 tablet (0.2 mg total)  by mouth 2 (two) times daily.  . clopidogrel (PLAVIX) 75 MG tablet TAKE 1 TABLET BY MOUTH ONCE DAILY.  . isosorbide mononitrate (IMDUR) 30 MG 24 hr tablet Take 0.5 tablets (15 mg total) by mouth daily.  . metoprolol succinate (TOPROL-XL) 100 MG 24 hr tablet TAKE ONE TABLET BY MOUTH ONCE DAILY.  . Multiple Vitamin (MULTIVITAMIN WITH MINERALS) TABS tablet Take 1 tablet by mouth daily.  . nitroGLYCERIN (NITROSTAT) 0.4 MG SL tablet Place 1 tablet (0.4 mg total) under the tongue every 5 (five) minutes as needed for chest pain.  . Omega-3 1000 MG CAPS Take 1 capsule by mouth daily.  . pantoprazole (PROTONIX) 40 MG tablet TAKE ONE TABLET BY MOUTH ONCE DAILY.  Marland Kitchen potassium chloride SA (KLOR-CON) 20 MEQ tablet TAKE ONE TABLET BY MOUTH ONCE DAILY.    ROS:  Review of Systems  HENT: Positive for hearing loss.   Eyes: Positive for blurred vision.  Respiratory: Negative for shortness of breath.   Cardiovascular: Negative for chest pain.  Musculoskeletal: Positive for joint pain.  Neurological: Negative for dizziness and headaches.     Objective:   Today's Vitals: BP 140/75 (BP Location: Right Arm, Patient Position: Sitting, Cuff Size: Normal)   Pulse 92   Temp (!) 97.3 F (36.3 C) (Temporal)   Ht '5\' 9"'  (1.753 m)    Wt 181 lb 12.8 oz (82.5 kg)   SpO2 98%   BMI 26.85 kg/m  Vitals with BMI 01/02/2020 10/12/2019 10/12/2019  Height '5\' 9"'  - '5\' 9"'   Weight 181 lbs 13 oz - 183 lbs  BMI 36.64 - 40.34  Systolic 742 595 638  Diastolic 75 68 66  Pulse 92 - 72     Physical Exam Vitals reviewed.  Constitutional:      Appearance: Normal appearance.  HENT:     Head: Normocephalic and atraumatic.  Cardiovascular:     Rate and Rhythm: Normal rate and regular rhythm.  Pulmonary:     Effort: Pulmonary effort is normal.     Breath sounds: Normal breath sounds.  Musculoskeletal:     Cervical back: Neck supple.  Skin:    General: Skin is warm and dry.  Neurological:     Mental Status: He is alert and oriented to person, place, and time.  Psychiatric:        Mood and Affect: Mood normal.        Behavior: Behavior normal.        Thought Content: Thought content normal.        Judgment: Judgment normal.          Assessment and Plan   1. Bilateral hip pain   2. Prediabetes   3. Mixed hyperlipidemia   4. Essential hypertension   5. Cirrhosis of liver without ascites, unspecified hepatic cirrhosis type ([place])      Plan: 1.  I will refer him to neurology for further evaluation and testing of his nerves to see if this is contributing to the pain in his hips.  2.  He will continue to abstain from processed carbohydrate intake we will consider collecting A1c next office visit.  3.  He will continue on his current chronic medications for his cholesterol and we will get lipid panel at next office visit.  4.  He will continue on his current medication regimen as prescribed  5.  I have encouraged him to consider getting the Pneumovax 23 vaccine administered, he tells me he will think about this prior to his next  appointment.  I have also encouraged him to abstain from alcohol completely, he will consider this.   Tests ordered Orders Placed This Encounter  Procedures  . Ambulatory referral to  Neurology      No orders of the defined types were placed in this encounter.   Patient to follow-up in 3 months or sooner as needed  Ailene Ards, NP

## 2020-01-02 NOTE — Patient Instructions (Signed)
Pneumovax or PPSV23 - look this vaccine up and learn about this

## 2020-01-23 ENCOUNTER — Other Ambulatory Visit (INDEPENDENT_AMBULATORY_CARE_PROVIDER_SITE_OTHER): Payer: Self-pay | Admitting: Nurse Practitioner

## 2020-01-23 ENCOUNTER — Other Ambulatory Visit: Payer: Self-pay | Admitting: Physician Assistant

## 2020-02-06 ENCOUNTER — Other Ambulatory Visit: Payer: Self-pay | Admitting: Cardiology

## 2020-02-16 ENCOUNTER — Other Ambulatory Visit: Payer: Self-pay | Admitting: Cardiology

## 2020-02-20 ENCOUNTER — Other Ambulatory Visit: Payer: Self-pay | Admitting: Cardiology

## 2020-03-01 ENCOUNTER — Encounter: Payer: Self-pay | Admitting: Neurology

## 2020-03-01 ENCOUNTER — Other Ambulatory Visit: Payer: Self-pay

## 2020-03-01 ENCOUNTER — Ambulatory Visit: Payer: Medicaid Other | Admitting: Neurology

## 2020-03-01 ENCOUNTER — Encounter (INDEPENDENT_AMBULATORY_CARE_PROVIDER_SITE_OTHER): Payer: Self-pay | Admitting: Nurse Practitioner

## 2020-03-01 VITALS — BP 146/78 | HR 87 | Ht 69.0 in | Wt 181.3 lb

## 2020-03-01 DIAGNOSIS — M25552 Pain in left hip: Secondary | ICD-10-CM

## 2020-03-01 DIAGNOSIS — M25551 Pain in right hip: Secondary | ICD-10-CM | POA: Diagnosis not present

## 2020-03-01 NOTE — Progress Notes (Signed)
Subjective:    Patient ID: Duane Adams is a 64 y.o. male.  HPI     Duane Age, MD, PhD [facility] [address] [place], [place] [address]  Dear Duane Adams,   I saw your patient, Duane Adams, upon your kind request in my neurologic clinic today for initial consultation of his hip pain.  The patient is unaccompanied today.  As you know, Duane Adams is a 64 year old right-handed gentleman with an underlying medical history of hypertension, hyperlipidemia, stroke, coronary artery disease, peripheral vascular disease, liver cirrhosis, allergies, anxiety, prediabetes, kidney stones, hiatal hernia, reflux disease, smoking and mildly overweight state, who reports a longstanding history of pain around his hips bilaterally, in particular, burning sensation at the hip joints, feels like a hot ball.  He has been followed by vascular specialty for peripheral vascular disease and it was not deemed that the pain was in keeping with vascular claudication. Symptoms have been ongoing for 3 to 4 years.  He has the pain only when he walks, not with sitting, even prolonged sitting or with standing.  He has not fallen.  He does not have any radiating pain, no radiating back pain.  He has had intermittent back pain and joint pain.  He has had multiple injuries.  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  He denies any numbness with the exception of focal numbness affecting areas that have previously been injured.  He does not have any lateral thigh burning on either side.  He denies any weakness. He smokes about a pack per day.  He has been able to lower his smoking in the past but has picked it up again.  He does not drink alcohol on a regular basis, maybe 2 beers per month, drinks caffeine in the form of sweetened tea, does not always hydrate well with water he admits.  His Past Medical History Is  Significant For: Past Medical History:  Diagnosis Date  . Allergy   . Anxiety   . Cirrhosis ([place])   . Coronary artery disease    a. 03/2013 Cath/PCI: LM nl, LAD 54m (2.5x20 Promus Premier DES), LCX min irregs, RCA dom, 90p(3.0x20 Promus Premier DES), EF 55-65%.  . CVA (cerebral vascular accident) ([place]) 01/31/2019  . Essential hypertension, benign   . GERD (gastroesophageal reflux disease)   . Helicobacter pylori gastritis JUN 2016 EGD/Bx   PREVPAK BID FOR 14 DAYS  . Hepatitis    Hepatitis C treated   . Hiatal hernia   . History of kidney stones   . Mixed hyperlipidemia   . Prediabetes 06/23/2019  . Skin cancer   . Substance abuse ([place])    alcoholic quit 7858  . Tobacco abuse     His Past Surgical History Is Significant For: Past Surgical History:  Procedure Laterality Date  . ABDOMINAL AORTOGRAM W/LOWER EXTREMITY N/A 06/15/2017   Procedure: ABDOMINAL AORTOGRAM W/LOWER EXTREMITY;  Surgeon: Angelia Mould, MD;  Location: [facility];  Service: Cardiovascular;  Laterality: N/A;  Bilateral  . BIOPSY N/A 12/19/2014   Procedure: BIOPSY;  Surgeon: Danie Binder, MD;  Location: [facility];  Service: Endoscopy;  Laterality: N/A;  . COLONOSCOPY  June 2016   [facility]: with endoscopic mucosal resection. 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 Needs colonoscopy in 1 year.   . COLONOSCOPY WITH PROPOFOL N/A 12/19/2014   Dr. Oneida Alar: six  simple adenomas and 3 hyperplastic polyps. Had flat mid-transverse colon polyp and referred to [facility] for endoscopic mucosal resection, which is scheduled for July 22  . CORONARY ANGIOPLASTY WITH STENT PLACEMENT  04/11/2013   LAD &  RCA     DR COOPER  . ESOPHAGOGASTRODUODENOSCOPY (EGD) WITH PROPOFOL N/A 12/19/2014   Dr. Oneida Alar:  without varices, small hiatal hernia noted, moderate non-erosive gastritis and mild duodenitis.  POSITIVE H.PYLORI. Prescribed Prevpac.  Marland Kitchen FLEXIBLE SIGMOIDOSCOPY N/A 12/23/2017   Procedure: FLEXIBLE SIGMOIDOSCOPY;   Surgeon: Danie Binder, MD;  Location: [facility];  Service: Endoscopy;  Laterality: N/A;  1:45pm  . HAND RECONSTRUCTION Left   . HEMORRHOID BANDING N/A 12/23/2017   Procedure: Thayer Jew;  Surgeon: Danie Binder, MD;  Location: [facility];  Service: Endoscopy;  Laterality: N/A;  . LEFT HEART CATHETERIZATION WITH CORONARY ANGIOGRAM N/A 04/11/2013   Procedure: LEFT HEART CATHETERIZATION WITH CORONARY ANGIOGRAM;  Surgeon: Blane Ohara, MD;  Location: [facility] LAB;  Service: Cardiovascular;  Laterality: N/A;  . LEG TENDON SURGERY Right   . POLYPECTOMY N/A 12/19/2014   Procedure: POLYPECTOMY;  Surgeon: Danie Binder, MD;  Location: [facility];  Service: Endoscopy;  Laterality: N/A;    His Family History Is Significant For: Family History  Problem Relation Adams of Onset  . Stroke Mother   . Aneurysm Mother   . Hypertension Mother   . Heart disease Father   . Hypertension Father   . Heart disease Brother   . Hypertension Brother   . Alcohol abuse Brother   . Diabetes Paternal Aunt   . Colon cancer Neg Hx     His Social History Is Significant For: Social History   Socioeconomic History  . Marital status: Soil scientist    Spouse name: Duane Adams  . Number of children: 2  . Years of education: 58  . Highest education level: Not on file  Occupational History  . Occupation: disabled    Comment: heart  Tobacco Use  . Smoking status: Light Tobacco Smoker    Packs/day: 0.50    Years: 40.00    Pack years: 20.00    Types: Cigarettes    Start date: 07/14/1968  . Smokeless tobacco: Never Used  . Tobacco comment: 5-6 Cigarettes per day  Vaping Use  . Vaping Use: Never used  Substance and Sexual Activity  . Alcohol use: Yes    Alcohol/week: 0.0 standard drinks    Comment: As of 11/26/17: social - 1-2 beers a sitting x 1-2 times a month  . Drug use: No  . Sexual activity: Not Currently  Other Topics Concern  . Not on file  Social History Narrative   Disabled from heart  disease   Previously worked in Scientist, research (medical) with Duane Adams for 28 years   Leisure: Teaching laboratory technician   Social Determinants of Health   Financial Resource Strain:   . Difficulty of Paying Living Expenses: Not on file  Food Insecurity:   . Worried About Charity fundraiser in the Last Year: Not on file  . Ran Out of Food in the Last Year: Not on file  Transportation Needs:   . Lack of Transportation (Medical): Not on file  . Lack of Transportation (Non-Medical): Not on file  Physical Activity:   . Days of Exercise per Week: Not on file  . Minutes of Exercise per Session: Not on file  Stress:   . Feeling of Stress : Not on file  Social Connections:   .  Frequency of Communication with Friends and Family: Not on file  . Frequency of Social Gatherings with Friends and Family: Not on file  . Attends Religious Services: Not on file  . Active Member of Clubs or Organizations: Not on file  . Attends Archivist Meetings: Not on file  . Marital Status: Not on file    His Allergies Are:  Allergies  Allergen Reactions  . Lisinopril Swelling    Swelling neck  . Chantix [Varenicline] Other (See Comments)    Abdominal pain  . Neosporin [Neomycin-Bacitracin Zn-Polymyx] Rash    blistered  :   His Current Medications Are:  Outpatient Encounter Medications as of 03/01/2020  Medication Sig  . amLODipine (NORVASC) 5 MG tablet TAKE ONE TABLET BY MOUTH ONCE DAILY.  Marland Kitchen aspirin EC 81 MG tablet Take 81 mg by mouth at bedtime.   . chlorthalidone (HYGROTON) 25 MG tablet Take 1 tablet (25 mg total) by mouth daily.  . Cholecalciferol (VITAMIN D3) 2000 units TABS Take 1 tablet by mouth daily.  . cloNIDine (CATAPRES) 0.2 MG tablet TAKE (1) TABLET BY MOUTH TWICE DAILY.  Marland Kitchen clopidogrel (PLAVIX) 75 MG tablet TAKE 1 TABLET BY MOUTH ONCE DAILY.  . isosorbide mononitrate (IMDUR) 30 MG 24 hr tablet TAKE 1/2 TABLET BY MOUTH ONCE DAILY. APPOINTMENT NEEDED FOR MORE REFILLS  . metoprolol succinate (TOPROL-XL) 100  MG 24 hr tablet TAKE ONE TABLET BY MOUTH ONCE DAILY.  . Multiple Vitamin (MULTIVITAMIN WITH MINERALS) TABS tablet Take 1 tablet by mouth daily.  . nitroGLYCERIN (NITROSTAT) 0.4 MG SL tablet Place 1 tablet (0.4 mg total) under the tongue every 5 (five) minutes as needed for chest pain.  . Omega-3 1000 MG CAPS Take 1 capsule by mouth daily.  . pantoprazole (PROTONIX) 40 MG tablet TAKE ONE TABLET BY MOUTH ONCE DAILY.  Marland Kitchen potassium chloride SA (KLOR-CON) 20 MEQ tablet TAKE ONE TABLET BY MOUTH ONCE DAILY.  Marland Kitchen atorvastatin (LIPITOR) 40 MG tablet TAKE ONE TABLET BY MOUTH ONCE DAILY.  . [DISCONTINUED] atorvastatin (LIPITOR) 40 MG tablet TAKE ONE TABLET BY MOUTH ONCE DAILY.   No facility-administered encounter medications on file as of 03/01/2020.  :   Review of Systems:  Out of a complete 14 point review of systems, all are reviewed and negative with the exception of these symptoms as listed below: Review of Systems  Neurological:       Here for consult on bilateral hip pain. Pt reports pain in most severe when he walks. He reports pain is equal in both hips. Pt reports sx have been present for 3-4 years but increased as of late.    Objective:  Neurological Exam  Physical Exam Physical Examination:   Vitals:   03/01/20 1036  BP: (!) 146/78  Pulse: 87  SpO2: 97%    General Examination: The patient is a very pleasant 64 y.o. male in no acute distress. He appears well-developed and well-nourished and well groomed.   HEENT: Normocephalic, atraumatic, pupils are equal, round and reactive to light and accommodation. Extraocular tracking is good without limitation to gaze excursion or nystagmus noted. Normal smooth pursuit is noted. Hearing is grossly intact. Face is symmetric with normal facial animation and normal facial sensation. Speech is clear with no dysarthria noted. There is no hypophonia. There is no lip, neck/head, jaw or voice tremor. Neck is supple with full range of passive and active  motion. There are no carotid bruits on auscultation. Oropharynx exam reveals: moderate mouth dryness, adequate dental hygiene.  Tongue protrudes centrally and palate elevates symmetrically.  Chest: Clear to auscultation without wheezing, rhonchi or crackles noted.  Heart: S1+S2+0, regular and normal without murmurs, rubs or gallops noted.   Abdomen: Soft, non-tender and non-distended with normal bowel sounds appreciated on auscultation.  Extremities: There is no pitting edema in the distal lower extremities bilaterally. Pedal pulses are intact.  Skin: Warm and dry without trophic changes noted. There are no varicose veins.  Musculoskeletal: exam reveals scarring around the left distal forearm and wrist area from prior injuries and surgeries.  Large scar right medial thigh from prior saw injury.   Neurologically:  Mental status: The patient is awake, alert and oriented in all 4 spheres. His immediate and remote memory, attention, language skills and fund of knowledge are appropriate. There is no evidence of aphasia, agnosia, apraxia or anomia. Speech is clear with normal prosody and enunciation. Thought process is linear. Mood is normal and affect is normal.  Cranial nerves II - XII are as described above under HEENT exam. In addition: shoulder shrug is normal with equal shoulder height noted. Motor exam: Normal bulk, strength and tone is noted. There is no drift, tremor or rebound. Romberg is negative. Reflexes are 2+ throughout. Babinski: Toes are flexor bilaterally. Fine motor skills and coordination: intact with normal finger taps, normal hand movements, normal rapid alternating patting, normal foot taps and normal foot agility.  Cerebellar testing: No dysmetria or intention tremor on finger to nose testing. Heel to shin is unremarkable bilaterally, with the exception of mild catch in the left hip with heel-to-shin testing but no dysmetria. There is no truncal or gait ataxia.  Sensory exam:  intact to light touch, pinprick, vibration, temperature sense in the upper and lower extremities, with the exception of decreased sensation to pinprick and temperature around the scar in the left wrist and distal forearm area as well as decreased sensation around the scar in the right medial thigh area.  No decrease in sensation in the lateral thigh areas bilaterally.  Gait, station and balance: He stands easily. No veering to one side is noted. No leaning to one side is noted. Posture is Adams-appropriate and stance is narrow based. Gait shows normal stride length and normal pace. No problems turning are noted.  No limp noted, no shuffling, tandem walk is unremarkable.   Assessment and Plan:  In summary, Duane Adams is a very pleasant 64 y.o.-year old male with an underlying medical history of hypertension, hyperlipidemia, stroke, coronary artery disease, peripheral vascular disease, liver cirrhosis, allergies, anxiety, prediabetes, kidney stones, hiatal hernia, reflux disease, smoking and mildly overweight state, who presents for evaluation of his hip pain of 3 to 4 years duration.  Neurologic exam does not show any telltale evidence of neuropathy, in particular, he has preserved reflexes, he has good strength, and good sensation.  His history is not supportive of meralgia paresthetica in its classic presentation either.  He had recent evaluation through vascular surgery.  Vascular claudication was not deemed likely either.  It is possible that he has arthritis.  He was also told that he could have lumbar spinal stenosis.  He has not had any spinal evaluation, may benefit from seeing a spine specialist but currently is denying any radiating low back pain.  I suggested we proceed with an x-ray of both hips.  He may benefit from seeing an orthopedic specialist but we will await test results.  We can certainly consider EMG and nerve conduction velocity testing, there is no  suggestive history for a radiculopathy  and his presentation is not in keeping with peripheral neuropathy.  He is agreeable to pursuing x-rays first.  Based on his test results we will call him and facilitate perhaps a referral to orthopedics and follow-up in this clinic as needed.  He is advised that he could use Tylenol as needed for symptomatic pain management but reports that he has not had any sustained pain to the point where he felt he needed to take something. I answered all his questions today and he was in agreement with the plan.  Thank you very much for allowing me to participate in the care of this nice patient. If I can be of any further assistance to you please do not hesitate to call me at [phone].  Sincerely,   Duane Age, MD, PhD

## 2020-03-01 NOTE — Telephone Encounter (Signed)
This encounter was created in error - please disregard.

## 2020-03-01 NOTE — Patient Instructions (Signed)
I do believe that your hip pain is more likely to be related to hip arthritis than an underlying neurological problem.  Reassuringly, your neurological exam is normal, in particular, you have good strength, good sensation, preserved reflexes in the upper and lower extremities.  You have good coordination in the upper and lower body.  You walk without any impediment.  I would recommend we proceed with x-rays of the hip joints on both sides and proceed from there.  If need be, we can certainly consider electrical nerve and muscle testing of your lower extremities but I do not see any telltale signs of nerve or muscle disease.  If you have lower back pain, you may benefit from seeing a spine specialist.  You may benefit from seeing an orthopedic specialist if you do have evidence of arthritis affecting the hips.  We will keep you posted as to your x-ray results and take it from there.

## 2020-03-09 ENCOUNTER — Other Ambulatory Visit: Payer: Self-pay | Admitting: Cardiology

## 2020-03-30 ENCOUNTER — Other Ambulatory Visit: Payer: Self-pay | Admitting: Cardiology

## 2020-04-09 ENCOUNTER — Ambulatory Visit (INDEPENDENT_AMBULATORY_CARE_PROVIDER_SITE_OTHER): Payer: Medicaid Other | Admitting: Nurse Practitioner

## 2020-04-09 ENCOUNTER — Encounter (INDEPENDENT_AMBULATORY_CARE_PROVIDER_SITE_OTHER): Payer: Self-pay | Admitting: Nurse Practitioner

## 2020-04-09 ENCOUNTER — Other Ambulatory Visit: Payer: Self-pay

## 2020-04-09 VITALS — BP 134/76 | HR 85 | Temp 97.9°F | Resp 16 | Ht 69.0 in | Wt 183.4 lb

## 2020-04-09 DIAGNOSIS — E782 Mixed hyperlipidemia: Secondary | ICD-10-CM | POA: Diagnosis not present

## 2020-04-09 DIAGNOSIS — G5762 Lesion of plantar nerve, left lower limb: Secondary | ICD-10-CM | POA: Diagnosis not present

## 2020-04-09 DIAGNOSIS — I2510 Atherosclerotic heart disease of native coronary artery without angina pectoris: Secondary | ICD-10-CM | POA: Diagnosis not present

## 2020-04-09 DIAGNOSIS — R7303 Prediabetes: Secondary | ICD-10-CM | POA: Diagnosis not present

## 2020-04-09 DIAGNOSIS — I10 Essential (primary) hypertension: Secondary | ICD-10-CM | POA: Diagnosis not present

## 2020-04-09 DIAGNOSIS — E559 Vitamin D deficiency, unspecified: Secondary | ICD-10-CM

## 2020-04-09 MED ORDER — ISOSORBIDE MONONITRATE ER 30 MG PO TB24: 15.0000 mg | ORAL_TABLET | Freq: Every day | ORAL | 0 refills | Status: DC

## 2020-04-09 NOTE — Progress Notes (Signed)
Subjective:  Patient ID: Duane Adams, male    DOB: [birth year]  Age: 64 y.o. MRN: [id number]  CC:  Chief Complaint  Patient presents with  . Hyperlipidemia  . Hypertension  . Other    Vitamin D deficiency  . Coronary Artery Disease  . Neuroma  . Prediabetes      HPI  This patient arrives today for the above.  Hyperlipidemia: He continues on atorvastatin, aspirin, and Plavix.  Tolerating his medications well.  He is due for lipid panel to be collected today.  Hypertension: He continues on amlodipine, chlorthalidone, clonidine, and metoprolol.  He is due for metabolic panel to be checked today.  He tells me he ran out of his isosorbide mononitrate but plans on following up with cardiology as scheduled in order to receive further refills.  He has been out of this for a couple of days.  He denies any chest pain.  Vitamin D deficiency: He continues on 2000 IUs of vitamin D3 daily.  He is due for serum check today.  CAD: He continues on his antihypertensives as stated above.  He tells me he has not needed to take a nitroglycerin sublingual tablet in many years.  Denies any shortness of breath or chest pain.  Neuroma: He tells me he has a history of Morton's neuroma and was planning on having this surgically removed last year.  He tells me he needs to see his podiatrist again because pain is getting progressively worse.  Prediabetes: Last A1c was 6.2.  He is due for serum check today.  Past Medical History:  Diagnosis Date  . Allergy   . Anxiety   . Cirrhosis ([place])   . Coronary artery disease    a. 03/2013 Cath/PCI: LM nl, LAD 62m(2.5x20 Promus Premier DES), LCX min irregs, RCA dom, 90p(3.0x20 Promus Premier DES), EF 55-65%.  . CVA (cerebral vascular accident) ([facility] 01/31/2019  . Essential hypertension, benign   . GERD (gastroesophageal reflux disease)   . Helicobacter pylori gastritis JUN 2016 EGD/Bx   PREVPAK BID FOR 14 DAYS  . Hepatitis    Hepatitis C treated   .  Hiatal hernia   . History of kidney stones   . Mixed hyperlipidemia   . Prediabetes 06/23/2019  . Skin cancer   . Substance abuse ([facility]    alcoholic quit 24503 . Tobacco abuse       Family History  Problem Relation Age of Onset  . Stroke Mother   . Aneurysm Mother   . Hypertension Mother   . Heart disease Father   . Hypertension Father   . Heart disease Brother   . Hypertension Brother   . Alcohol abuse Brother   . Diabetes Paternal Aunt   . 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 Smoking status: Light Tobacco Smoker    Packs/day: 0.50    Years: 40.00    Pack years: 20.00    Types: Cigarettes    Start date: 07/14/1968  . Smokeless tobacco: Never Used  . Tobacco comment: 5-6 Cigarettes per day  Substance Use Topics  . Alcohol use: Yes    Alcohol/week: 0.0 standard drinks    Comment: As of 11/26/17: social - 1-2 beers a sitting x 1-2  times a month     Current Meds  Medication Sig  . amLODipine (NORVASC) 5 MG tablet TAKE ONE TABLET BY MOUTH ONCE DAILY.  Marland Kitchen aspirin EC 81 MG tablet Take 81 mg by mouth at bedtime.   . chlorthalidone (HYGROTON) 25 MG tablet Take 1 tablet (25 mg total) by mouth daily.  . Cholecalciferol (VITAMIN D3) 2000 units TABS Take 1 tablet by mouth daily.  . cloNIDine (CATAPRES) 0.2 MG tablet TAKE (1) TABLET BY MOUTH TWICE DAILY.  Marland Kitchen clopidogrel (PLAVIX) 75 MG tablet TAKE 1 TABLET BY MOUTH ONCE DAILY.  . metoprolol succinate (TOPROL-XL) 100 MG 24 hr tablet TAKE ONE TABLET BY MOUTH ONCE DAILY.  . Multiple Vitamin (MULTIVITAMIN WITH MINERALS) TABS tablet Take 1 tablet by mouth daily.  . nitroGLYCERIN (NITROSTAT) 0.4 MG SL tablet Place 1 tablet (0.4 mg total) under the tongue every 5 (five) minutes as needed for chest pain.  . Omega-3 1000 MG CAPS Take 1 capsule by mouth  daily.  . pantoprazole (PROTONIX) 40 MG tablet TAKE ONE TABLET BY MOUTH ONCE DAILY.  Marland Kitchen potassium chloride SA (KLOR-CON) 20 MEQ tablet TAKE ONE TABLET BY MOUTH ONCE DAILY.    ROS:  Review of Systems  Eyes: Negative for blurred vision.  Respiratory: Negative for shortness of breath.   Cardiovascular: Negative for chest pain.  Neurological: Positive for dizziness. Negative for headaches.     Objective:   Today's Vitals: BP 134/76   Pulse 85   Temp 97.9 F (36.6 C)   Resp 16   Ht 5' 9" (1.753 m)   Wt 183 lb 6.4 oz (83.2 kg)   SpO2 96%   BMI 27.08 kg/m  Vitals with BMI 04/09/2020 03/01/2020 01/02/2020  Height 5' 9" 5' 9" 5' 9"  Weight 183 lbs 6 oz 181 lbs 5 oz 181 lbs 13 oz  BMI 27.07 96.28 36.62  Systolic 947 654 650  Diastolic 76 78 75  Pulse 85 87 92     Physical Exam Vitals reviewed.  Constitutional:      Appearance: Normal appearance.  HENT:     Head: Normocephalic and atraumatic.  Cardiovascular:     Rate and Rhythm: Normal rate and regular rhythm.  Pulmonary:     Effort: Pulmonary effort is normal.     Breath sounds: Normal breath sounds.  Musculoskeletal:     Cervical back: Neck supple.       Feet:  Feet:     Right foot:     Skin integrity: Dry skin present.     Left foot:     Skin integrity: Dry skin present.  Skin:    General: Skin is warm and dry.  Neurological:     Mental Status: He is alert and oriented to person, place, and time.  Psychiatric:        Mood and Affect: Mood normal.        Behavior: Behavior normal.        Thought Content: Thought content normal.        Judgment: Judgment normal.          Assessment and Plan   1. Coronary artery disease involving native coronary artery of native heart without angina pectoris   2. Mixed hyperlipidemia   3. Essential hypertension   4. Prediabetes   5. Morton's neuroma of left foot   6. Vitamin D deficiency      Plan: 1.  We will refill Imdur today and recommend he follow-up with  cardiology as scheduled.  He will continue on his other medications as prescribed. 2.  We will collect lipid panel today, he will continue on his medications as currently prescribed. 3.  We will continue on his antihypertensives as currently prescribed. 4.  We will collect A1c today for further evaluation. 5.  We will refer him back to podiatry for management of this. 6.  We will check serum level of vitamin D3 today.   Tests ordered Orders Placed This Encounter  Procedures  . CMP with eGFR([facility])  . Hemoglobin A1c  . Lipid Panel  . Vitamin D, 25-hydroxy  . CBC  . Ambulatory referral to [facility] ordered this encounter  Medications  . isosorbide mononitrate (IMDUR) 30 MG 24 hr tablet    Sig: Take 0.5 tablets (15 mg total) by mouth daily.    Dispense:  15 tablet    Refill:  0    Order Specific Question:   Supervising Provider    Answer:   Doree Albee [[id number]]    Patient to follow-up in 3 months or sooner as needed.  Ailene Ards, NP'

## 2020-04-10 ENCOUNTER — Other Ambulatory Visit (INDEPENDENT_AMBULATORY_CARE_PROVIDER_SITE_OTHER): Payer: Self-pay | Admitting: Nurse Practitioner

## 2020-04-10 LAB — COMPLETE METABOLIC PANEL WITH GFR
AG Ratio: 1.7 (calc) (ref 1.0–2.5)
ALT: 18 U/L (ref 9–46)
AST: 17 U/L (ref 10–35)
Albumin: 4.7 g/dL (ref 3.6–5.1)
Alkaline phosphatase (APISO): 51 U/L (ref 35–144)
BUN: 16 mg/dL (ref 7–25)
CO2: 29 mmol/L (ref 20–32)
Calcium: 10 mg/dL (ref 8.6–10.3)
Chloride: 100 mmol/L (ref 98–110)
Creat: 0.81 mg/dL (ref 0.70–1.25)
GFR, Est African American: 110 mL/min/{1.73_m2} (ref >=60)
GFR, Est Non African American: 95 mL/min/{1.73_m2} (ref >=60)
Globulin: 2.8 g/dL (calc) (ref 1.9–3.7)
Glucose, Bld: 125 mg/dL — ABNORMAL HIGH (ref 65–99)
Potassium: 3.6 mmol/L (ref 3.5–5.3)
Sodium: 139 mmol/L (ref 135–146)
Total Bilirubin: 0.4 mg/dL (ref 0.2–1.2)
Total Protein: 7.5 g/dL (ref 6.1–8.1)

## 2020-04-10 LAB — CBC
HCT: 44.1 % (ref 38.5–50.0)
Hemoglobin: 15.1 g/dL (ref 13.2–17.1)
MCH: 31.7 pg (ref 27.0–33.0)
MCHC: 34.2 g/dL (ref 32.0–36.0)
MCV: 92.6 fL (ref 80.0–100.0)
MPV: 10.5 fL (ref 7.5–12.5)
Platelets: 220 10*3/uL (ref 140–400)
RBC: 4.76 10*6/uL (ref 4.20–5.80)
RDW: 12.9 % (ref 11.0–15.0)
WBC: 6.5 10*3/uL (ref 3.8–10.8)

## 2020-04-10 LAB — HEMOGLOBIN A1C
Hgb A1c MFr Bld: 5.9 % of total Hgb — ABNORMAL HIGH (ref <5.7)
Mean Plasma Glucose: 123 (calc)
eAG (mmol/L): 6.8 (calc)

## 2020-04-10 LAB — LIPID PANEL
Cholesterol: 197 mg/dL (ref <200)
HDL: 47 mg/dL (ref >=40)
Non-HDL Cholesterol (Calc): 150 mg/dL (calc) — ABNORMAL HIGH (ref <130)
Total CHOL/HDL Ratio: 4.2 (calc) (ref <5.0)
Triglycerides: 543 mg/dL — ABNORMAL HIGH (ref <150)

## 2020-04-10 LAB — VITAMIN D 25 HYDROXY (VIT D DEFICIENCY, FRACTURES): Vit D, 25-Hydroxy: 46 ng/mL (ref 30–100)

## 2020-04-10 MED ORDER — OMEGA-3 1000 MG PO CAPS: 1.0000 | ORAL_CAPSULE | Freq: Two times a day (BID) | ORAL | 0 refills | Status: DC

## 2020-05-02 ENCOUNTER — Other Ambulatory Visit (INDEPENDENT_AMBULATORY_CARE_PROVIDER_SITE_OTHER): Payer: Self-pay | Admitting: Internal Medicine

## 2020-06-01 ENCOUNTER — Ambulatory Visit (INDEPENDENT_AMBULATORY_CARE_PROVIDER_SITE_OTHER): Payer: Medicaid Other | Admitting: Podiatry

## 2020-06-01 ENCOUNTER — Encounter: Payer: Self-pay | Admitting: Podiatry

## 2020-06-01 ENCOUNTER — Other Ambulatory Visit: Payer: Self-pay

## 2020-06-01 ENCOUNTER — Ambulatory Visit (INDEPENDENT_AMBULATORY_CARE_PROVIDER_SITE_OTHER): Payer: Medicaid Other

## 2020-06-01 DIAGNOSIS — D3610 Benign neoplasm of peripheral nerves and autonomic nervous system, unspecified: Secondary | ICD-10-CM

## 2020-06-01 DIAGNOSIS — D3613 Benign neoplasm of peripheral nerves and autonomic nervous system of lower limb, including hip: Secondary | ICD-10-CM | POA: Diagnosis not present

## 2020-06-04 ENCOUNTER — Other Ambulatory Visit (INDEPENDENT_AMBULATORY_CARE_PROVIDER_SITE_OTHER): Payer: Self-pay | Admitting: Nurse Practitioner

## 2020-06-04 DIAGNOSIS — I2510 Atherosclerotic heart disease of native coronary artery without angina pectoris: Secondary | ICD-10-CM

## 2020-06-04 DIAGNOSIS — R7303 Prediabetes: Secondary | ICD-10-CM

## 2020-06-04 DIAGNOSIS — I10 Essential (primary) hypertension: Secondary | ICD-10-CM

## 2020-06-04 DIAGNOSIS — E782 Mixed hyperlipidemia: Secondary | ICD-10-CM

## 2020-06-04 NOTE — Progress Notes (Signed)
Subjective:   Patient ID: Duane Adams, male   DOB: 64 y.o.   MRN: [id number]   HPI Patient states that he has had continued neuroma symptomatology but has not been able to get back since Covid.  He has been to the vascular doctor and notes appear to clear him for surgery and he has exquisite discomfort left forefoot   ROS      Objective:  Physical Exam  Neurovascular status mildly diminished but PT pulses are found to be strong with patient having seen Dr. Doren Custard and appears to have adequate forefoot circulatory status for healing.  Patient is found to have a positive Mulder sign left with shooting discomfort into the interspace with mild on the right     Assessment:  Neuroma symptomatology with shooting pains third interspace left     Plan:  H&P reviewed condition discussed treatment options.  At this point he does not want surgery and I did explain the risk of circulatory disease and patient is willing to accept risk.  I went ahead and I went over consent form with him and discovered that he needs to be done at the hospital so I will be referring to another physician for this surgery in January.  I spent a great deal of time educating him on the condition and risk

## 2020-06-21 ENCOUNTER — Other Ambulatory Visit: Payer: Self-pay | Admitting: Cardiology

## 2020-06-27 ENCOUNTER — Ambulatory Visit: Payer: Medicaid Other | Admitting: Podiatry

## 2020-07-03 ENCOUNTER — Ambulatory Visit (INDEPENDENT_AMBULATORY_CARE_PROVIDER_SITE_OTHER): Payer: Medicaid Other | Admitting: Podiatry

## 2020-07-03 ENCOUNTER — Other Ambulatory Visit: Payer: Self-pay

## 2020-07-03 DIAGNOSIS — Z01818 Encounter for other preprocedural examination: Secondary | ICD-10-CM

## 2020-07-03 DIAGNOSIS — D3610 Benign neoplasm of peripheral nerves and autonomic nervous system, unspecified: Secondary | ICD-10-CM | POA: Diagnosis not present

## 2020-07-04 ENCOUNTER — Encounter: Payer: Self-pay | Admitting: Podiatry

## 2020-07-04 NOTE — Progress Notes (Signed)
Subjective:  Patient ID: Duane Adams, male    DOB: 23-Sep-1955,  MRN: [id number]  Chief Complaint  Patient presents with  . surgery     PT is here to discuss possible surgery    64 y.o. male presents with the above complaint.  Patient presents with a complaint of left third interspace neuroma.  Patient states is painful to walk on it.  Feels like walking on a pebble.  Patient is referred to me by Dr. Paulla Dolly with done multiple conservative therapy without any relief.  At this time patient would like for me to discuss surgical options with excision of the neuroma.  He denies any other acute complaints he has failed all conservative treatment options.   Review of Systems: Negative except as noted in the HPI. Denies N/V/F/Ch.  Past Medical History:  Diagnosis Date  . Allergy   . Anxiety   . Cirrhosis ([place])   . Coronary artery disease    a. 03/2013 Cath/PCI: LM nl, LAD 55m (2.5x20 Promus Premier DES), LCX min irregs, RCA dom, 90p(3.0x20 Promus Premier DES), EF 55-65%.  . CVA (cerebral vascular accident) ([place]) 01/31/2019  . Essential hypertension, benign   . GERD (gastroesophageal reflux disease)   . Helicobacter pylori gastritis JUN 2016 EGD/Bx   PREVPAK BID FOR 14 DAYS  . Hepatitis    Hepatitis C treated   . Hiatal hernia   . History of kidney stones   . Mixed hyperlipidemia   . Prediabetes 06/23/2019  . Skin cancer   . Substance abuse ([place])    alcoholic quit 3419  . Tobacco abuse     Current Outpatient Medications:  .  amLODipine (NORVASC) 5 MG tablet, TAKE ONE TABLET BY MOUTH ONCE DAILY., Disp: 90 tablet, Rfl: 0 .  aspirin EC 81 MG tablet, Take 81 mg by mouth at bedtime. , Disp: , Rfl:  .  atorvastatin (LIPITOR) 40 MG tablet, TAKE ONE TABLET BY MOUTH ONCE DAILY., Disp: 30 tablet, Rfl: 3 .  chlorthalidone (HYGROTON) 25 MG tablet, TAKE 1 TABLET BY MOUTH DAILY., Disp: 30 tablet, Rfl: 0 .  Cholecalciferol (VITAMIN D3) 2000 units TABS, Take 1 tablet by mouth daily., Disp: , Rfl:   .  cloNIDine (CATAPRES) 0.2 MG tablet, TAKE (1) TABLET BY MOUTH TWICE DAILY., Disp: 180 tablet, Rfl: 3 .  clopidogrel (PLAVIX) 75 MG tablet, TAKE 1 TABLET BY MOUTH ONCE DAILY., Disp: 30 tablet, Rfl: 11 .  isosorbide mononitrate (IMDUR) 30 MG 24 hr tablet, TAKE 1/2 TABLET BY MOUTH ONCE DAILY., Disp: 15 tablet, Rfl: 2 .  metoprolol succinate (TOPROL-XL) 100 MG 24 hr tablet, TAKE ONE TABLET BY MOUTH ONCE DAILY., Disp: 30 tablet, Rfl: 3 .  Multiple Vitamin (MULTIVITAMIN WITH MINERALS) TABS tablet, Take 1 tablet by mouth daily., Disp: , Rfl:  .  nitroGLYCERIN (NITROSTAT) 0.4 MG SL tablet, Place 1 tablet (0.4 mg total) under the tongue every 5 (five) minutes as needed for chest pain., Disp: 25 tablet, Rfl: 3 .  Omega-3 1000 MG CAPS, Take 1 capsule (1,000 mg total) by mouth in the morning and at bedtime., Disp: 180 capsule, Rfl: 0 .  pantoprazole (PROTONIX) 40 MG tablet, TAKE ONE TABLET BY MOUTH ONCE DAILY., Disp: 90 tablet, Rfl: 3 .  potassium chloride SA (KLOR-CON) 20 MEQ tablet, TAKE ONE TABLET BY MOUTH ONCE DAILY., Disp: 30 tablet, Rfl: 11  Social History   Tobacco Use  Smoking Status Light Tobacco Smoker  . Packs/day: 0.50  . Years: 40.00  . Pack years: 20.00  .  Types: Cigarettes  . Start date: 07/14/1968  Smokeless Tobacco Never Used  Tobacco Comment   5-6 Cigarettes per day    Allergies  Allergen Reactions  . Lisinopril Swelling    Swelling neck  . Chantix [Varenicline] Other (See Comments)    Abdominal pain  . Neosporin [Neomycin-Bacitracin Zn-Polymyx] Rash    blistered   Objective:  There were no vitals filed for this visit. There is no height or weight on file to calculate BMI. Constitutional Well developed. Well nourished.  Vascular Dorsalis pedis pulses palpable bilaterally. Posterior tibial pulses palpable bilaterally. Capillary refill normal to all digits.  No cyanosis or clubbing noted. Pedal hair growth normal.  Neurologic Normal speech. Oriented to person, place,  and time. Epicritic sensation to light touch grossly present bilaterally.  Dermatologic Nails well groomed and normal in appearance. No open wounds. No skin lesions.  Orthopedic:  Positive Mulder click noted to the left third interspace.  Negative Sullivan sign.  Pain with lateral squeezing of the metatarsal heads.  No pain with range of motion of the third and the fourth metatarsophalangeal joint including intra-articular pain.  No flexor or extensor tendinitis noted.  No pain at the plantar plate.     Radiographs: Prior radiographs were reviewed: 3 views of skeletally mature adult left foot: There is a decrease in intermetatarsal angle between the third and the fourth metatarsal head likely attribute into a neuroma.  No other bony abnormalities identified. Assessment:   1. Neuroma   2. Preoperative examination    Plan:  Patient was evaluated and treated and all questions answered.  Left third interspace neuroma -I explained to the patient the etiology of neuroma and various treatment options were discussed.  Given that patient has failed all conservative treatment options I believe patient will benefit from a surgical excision of the neuroma.  I discussed my surgical plan in extensive detail including postop protocol with weightbearing as tolerated in a surgical shoe.  Patient states understanding and would like to proceed with the excision of the neuroma. -Informed surgical risk consent was reviewed and read aloud to the patient.  I reviewed the films.  I have discussed my findings with the patient in great detail.  I have discussed all risks including but not limited to infection, stiffness, scarring, limp, disability, deformity, damage to blood vessels and nerves, numbness, poor healing, need for braces, arthritis, chronic pain, amputation, death.  All benefits and realistic expectations discussed in great detail.  I have made no promises as to the outcome.  I have provided realistic  expectations.  I have offered the patient a 2nd opinion, which they have declined and assured me they preferred to proceed despite the risks -A total of 33 minutes was spent in direct patient care as well as pre and post patient encounter activities.  This includes documentation as well as reviewing patient chart for labs, imaging, past medical, surgical, social, and family history as documented in the EMR.  I have reviewed medication allergies as documented in EMR.  I discussed the etiology of condition and treatment options from conservative to surgical care.  All risks and benefit of the treatment course was discussed in detail.  All questions were answered and return appointment was discussed.  Since the visit completed in an ambulatory/outpatient setting, the patient and/or parent/guardian has been advised to contact the providers office for worsening condition and seek medical treatment and/or call 911 if the patient deems either is necessary.   No follow-ups on file.

## 2020-07-11 ENCOUNTER — Other Ambulatory Visit (INDEPENDENT_AMBULATORY_CARE_PROVIDER_SITE_OTHER): Payer: Self-pay | Admitting: Internal Medicine

## 2020-07-13 ENCOUNTER — Other Ambulatory Visit: Payer: Self-pay | Admitting: Cardiology

## 2020-07-17 ENCOUNTER — Encounter (INDEPENDENT_AMBULATORY_CARE_PROVIDER_SITE_OTHER): Payer: Self-pay | Admitting: Internal Medicine

## 2020-07-17 ENCOUNTER — Ambulatory Visit (INDEPENDENT_AMBULATORY_CARE_PROVIDER_SITE_OTHER): Payer: Medicaid Other | Admitting: Internal Medicine

## 2020-07-17 ENCOUNTER — Other Ambulatory Visit: Payer: Self-pay

## 2020-07-17 VITALS — BP 146/89 | HR 89 | Temp 97.9°F | Resp 18 | Ht 69.0 in | Wt 195.0 lb

## 2020-07-17 DIAGNOSIS — R7303 Prediabetes: Secondary | ICD-10-CM | POA: Diagnosis not present

## 2020-07-17 DIAGNOSIS — I10 Essential (primary) hypertension: Secondary | ICD-10-CM

## 2020-07-17 DIAGNOSIS — M25552 Pain in left hip: Secondary | ICD-10-CM | POA: Diagnosis not present

## 2020-07-17 DIAGNOSIS — E559 Vitamin D deficiency, unspecified: Secondary | ICD-10-CM | POA: Diagnosis not present

## 2020-07-17 DIAGNOSIS — E782 Mixed hyperlipidemia: Secondary | ICD-10-CM

## 2020-07-17 DIAGNOSIS — M25551 Pain in right hip: Secondary | ICD-10-CM

## 2020-07-17 LAB — LIPID PANEL
Cholesterol: 193 mg/dL (ref <200)
HDL: 45 mg/dL (ref >=40)
Non-HDL Cholesterol (Calc): 148 mg/dL (calc) — ABNORMAL HIGH (ref <130)
Total CHOL/HDL Ratio: 4.3 (calc) (ref <5.0)
Triglycerides: 712 mg/dL — ABNORMAL HIGH (ref <150)

## 2020-07-17 NOTE — Progress Notes (Signed)
Metrics: Intervention Frequency ACO  Documented Smoking Status Yearly  Screened one or more times in 24 months  Cessation Counseling or  Active cessation medication Past 24 months  Past 24 months   Guideline developer: UpToDate (See UpToDate for funding source) Date Released: 2014       Wellness Office Visit  Subjective:  Patient ID: Duane Adams, male    DOB: 05-13-1956  Age: 65 y.o. MRN: [id number]  CC: This man comes in for follow-up of dyslipidemia, hypertension, prediabetes, vitamin D deficiency. HPI  Today is complaining of bilateral hip pain, more on the left than the right.  He was sent for x-rays in August of last year but he did not attend. He is trying to quit smoking and is down to 2 cigarettes/day. He denies any chest pain, dyspnea.  He is waiting for an appointment from cardiology to follow-up.  I am not sure if he has been contacted or not.  I told him he should see the cardiologist at least once a year. He did increase his omega 3 to help with his hypertriglyceridemia. He also increased vitamin D3 supplementation for vitamin D deficiency. Past Medical History:  Diagnosis Date  . Allergy   . Anxiety   . Cirrhosis ([facility])   . Coronary artery disease    a. 03/2013 Cath/PCI: LM nl, LAD 62m (2.5x20 Promus Premier DES), LCX min irregs, RCA dom, 90p(3.0x20 Promus Premier DES), EF 55-65%.  . CVA (cerebral vascular accident) ([facility]) 01/31/2019  . Essential hypertension, benign   . GERD (gastroesophageal reflux disease)   . Helicobacter pylori gastritis JUN 2016 EGD/Bx   PREVPAK BID FOR 14 DAYS  . Hepatitis    Hepatitis C treated   . Hiatal hernia   . History of kidney stones   . Mixed hyperlipidemia   . Prediabetes 06/23/2019  . Skin cancer   . Substance abuse ([facility])    alcoholic quit 2016  . Tobacco abuse    Past Surgical History:  Procedure Laterality Date  . ABDOMINAL AORTOGRAM W/LOWER EXTREMITY N/A 06/15/2017   Procedure: ABDOMINAL AORTOGRAM W/LOWER EXTREMITY;   Surgeon: Chuck Hint, MD;  Location: [facility] INVASIVE CV LAB;  Service: Cardiovascular;  Laterality: N/A;  Bilateral  . BIOPSY N/A 12/19/2014   Procedure: BIOPSY;  Surgeon: West Bali, MD;  Location: [facility] ORS;  Service: Endoscopy;  Laterality: N/A;  . COLONOSCOPY  June 2016   [facility]: with endoscopic mucosal resection. 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 Needs colonoscopy in 1 year.   . COLONOSCOPY WITH PROPOFOL N/A 12/19/2014   Dr. Darrick Penna: six simple adenomas and 3 hyperplastic polyps. Had flat mid-transverse colon polyp and referred to [facility] for endoscopic mucosal resection, which is scheduled for July 22  . CORONARY ANGIOPLASTY WITH STENT PLACEMENT  04/11/2013   LAD &  RCA     DR COOPER  . ESOPHAGOGASTRODUODENOSCOPY (EGD) WITH PROPOFOL N/A 12/19/2014   Dr. Darrick Penna:  without varices, small hiatal hernia noted, moderate non-erosive gastritis and mild duodenitis.  POSITIVE H.PYLORI. Prescribed Prevpac.  Marland Kitchen FLEXIBLE SIGMOIDOSCOPY N/A 12/23/2017   Procedure: FLEXIBLE SIGMOIDOSCOPY;  Surgeon: West Bali, MD;  Location: [facility] ENDO SUITE;  Service: Endoscopy;  Laterality: N/A;  1:45pm  . HAND RECONSTRUCTION Left   . HEMORRHOID BANDING N/A 12/23/2017   Procedure: [place];  Surgeon: West Bali, MD;  Location: [facility] ENDO SUITE;  Service: Endoscopy;  Laterality: N/A;  . LEFT HEART CATHETERIZATION WITH CORONARY ANGIOGRAM N/A 04/11/2013   Procedure: LEFT HEART  CATHETERIZATION WITH CORONARY ANGIOGRAM;  Surgeon: Micheline Chapman, MD;  [facility] C[facility] LAB;  Service: Cardiovascular;  Laterality: N/A;  . LEG TENDON SURGERY Right   . POLYPECTOMY N/A 12/19/2014   Procedure: POLYPECTOMY;  Surgeon: West Bali, MD;  Location: [facility] ORS;  Service: Endoscopy;  Laterality: N/A;     Family History  Problem Relation Age of Onset  . Stroke Mother   . Aneurysm Mother   . Hypertension Mother   . Heart disease Father   . Hypertension Father   . Heart disease Brother   .  Hypertension Brother   . Alcohol abuse Brother   . Diabetes Paternal Aunt   . 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 Smoking status: Light Tobacco Smoker    Packs/day: 0.50    Years: 40.00    Pack years: 20.00    Types: Cigarettes    Start date: 07/14/1968  . Smokeless tobacco: Never Used  . Tobacco comment: 5-6 Cigarettes per day  Substance Use Topics  . Alcohol use: Yes    Alcohol/week: 0.0 standard drinks    Comment: As of 11/26/17: social - 1-2 beers a sitting x 1-2 times a month    Current Meds  Medication Sig  . amLODipine (NORVASC) 5 MG tablet TAKE ONE TABLET BY MOUTH ONCE DAILY.  Marland Kitchen aspirin EC 81 MG tablet Take 81 mg by mouth at bedtime.   . chlorthalidone (HYGROTON) 25 MG tablet TAKE 1 TABLET BY MOUTH DAILY.  Marland Kitchen Cholecalciferol (VITAMIN D3) 2000 units TABS Take 2 tablets by mouth daily.  . cloNIDine (CATAPRES) 0.2 MG tablet TAKE (1) TABLET BY MOUTH TWICE DAILY.  Marland Kitchen clopidogrel (PLAVIX) 75 MG tablet TAKE 1 TABLET BY MOUTH ONCE DAILY.  . isosorbide mononitrate (IMDUR) 30 MG 24 hr tablet TAKE 1/2 TABLET BY MOUTH ONCE DAILY.  . metoprolol succinate (TOPROL-XL) 100 MG 24 hr tablet TAKE ONE TABLET BY MOUTH ONCE DAILY.  . Multiple Vitamin (MULTIVITAMIN WITH MINERALS) TABS tablet Take 1 tablet by mouth daily.  . nitroGLYCERIN (NITROSTAT) 0.4 MG SL tablet Place 1 tablet (0.4 mg total) under the tongue every 5 (five) minutes as needed for chest pain.  . Omega-3 1000 MG CAPS Take 1 capsule (1,000 mg total) by mouth in the morning and at bedtime.  . pantoprazole (PROTONIX) 40 MG tablet TAKE ONE TABLET BY MOUTH ONCE DAILY.  Marland Kitchen potassium chloride SA (KLOR-CON) 20 MEQ tablet TAKE ONE TABLET BY MOUTH ONCE DAILY.      Depression screen [facility] 2/9 09/26/2019 07/20/2017 05/05/2017  Decreased Interest 0 0 0  Down,  Depressed, Hopeless 0 0 0  PHQ - 2 Score 0 0 0     Objective:   Today's Vitals: BP (!) 146/89 (BP Location: Right Arm, Patient Position: Sitting, Cuff Size: Normal)   Pulse 89   Temp 97.9 F (36.6 C) (Temporal)   Resp 18   Ht 5\' 9"  (1.753 m)   Wt 195 lb (88.5 kg)   SpO2 96%   BMI 28.80 kg/m  Vitals with BMI 07/17/2020 07/17/2020 04/09/2020  Height 5\' 9"  5\' 9"  5\' 9"   Weight 195 lbs 195 lbs 183 lbs 6 oz  BMI 28.78 28.78 27.07  Systolic 146 - 134  Diastolic 89 - 76  Pulse  89 - 85     Physical Exam  He looks older than his stated age.  Blood pressure elevated today.  Weight is stable.     Assessment   1. Mixed hyperlipidemia   2. Essential hypertension   3. Prediabetes   4. Vitamin D deficiency   5. Bilateral hip pain       Tests ordered Orders Placed This Encounter  Procedures  . Lipid panel  . Ambulatory referral to Orthopedic Surgery     Plan: 1. He will continue with omega-3 and we will check his lipid panel to see what is triglycerides are doing in particular. 2. He will continue with chlorthalidone, amlodipine, metoprolol for hypertension. 3. He will continue with vitamin D3 supplementation for vitamin D deficiency. 4. I will refer him to orthopedic surgeon to evaluate painful left hip especially. 5. Follow-up with Maralyn Sago in about 4 months.  I discussed with him again COVID-19 vaccination and he is fairly adamant that he does not want to receive this vaccination.  He thinks that "natural immunity "will protect him.   No orders of the defined types were placed in this encounter.   Wilson Singer, MD

## 2020-07-18 ENCOUNTER — Other Ambulatory Visit (INDEPENDENT_AMBULATORY_CARE_PROVIDER_SITE_OTHER): Payer: Self-pay | Admitting: Internal Medicine

## 2020-07-18 NOTE — Progress Notes (Signed)
Please call this patient.  Tell him that his triglyceride levels are extremely high and he needs to increase the omega-3 capsules so that he is taking 2 capsules twice a day.  He needs to work on his diet also.  Follow-up as scheduled.

## 2020-07-19 NOTE — Progress Notes (Signed)
Pt return call back to get lab results. Pt was shock with results. He stated he will get on computer now and read up on things he can eat to make this better and get the 10 lbs off he gain recently.

## 2020-07-26 ENCOUNTER — Other Ambulatory Visit (HOSPITAL_COMMUNITY)
Admission: RE | Admit: 2020-07-26 | Discharge: 2020-07-26 | Disposition: A | Discharge status: Home / Self Care | Payer: Medicaid Other | Source: Ambulatory Visit | Attending: Podiatry | Admitting: Podiatry

## 2020-07-26 ENCOUNTER — Encounter (HOSPITAL_BASED_OUTPATIENT_CLINIC_OR_DEPARTMENT_OTHER): Payer: Self-pay | Admitting: Podiatry

## 2020-07-26 ENCOUNTER — Other Ambulatory Visit: Payer: Self-pay

## 2020-07-26 ENCOUNTER — Telehealth: Payer: Self-pay

## 2020-07-26 DIAGNOSIS — Z20822 Contact with and (suspected) exposure to covid-19: Secondary | ICD-10-CM | POA: Insufficient documentation

## 2020-07-26 DIAGNOSIS — Z01812 Encounter for preprocedural laboratory examination: Secondary | ICD-10-CM | POA: Insufficient documentation

## 2020-07-26 NOTE — Progress Notes (Signed)
Spoke w/ via phone for pre-op interview---pt Lab needs dos----   I stat  8            COVID test ------07-26-2020 1220 Arrive at -------900 am 07-30-2020 NPO after MN NO Solid Food.  Clear liquids from MN until---800 am then npo Medications to take morning of surgery -----isosorbide mononitrate, atorvastatin, clinidine, amlodipine, pantaprazole Diabetic medication -----n/a Patient Special Instructions ----- Pre-Op special Istructions ----- Patient verbalized understanding of instructions that were given at this phone interview. Patient denies shortness of breath, chest pain, fever, cough at this phone interview.  Anesthesia : hx of cad significant lad and rca diseaase  S/p des to both 2014, htn, pad, cirrhosis mild per pt, hepatitis c took treatment for, cva7-20-2020 mild memory deficit, lov dr branch cardiology 05-11-2019 note says follow up in 4 months, aaa lov dr Scot Dock 10-12-2019 small focal dilation mid aorta 2-01 x 1-57 cm, pad patient states no problems with household activities no cardiac symptoms, no swelling of le, no sob  Chat to [place] zanetto pa for review  PCP:dr nimish gosrani H & P /medical clearance 07-25-2020 on chartlov 07-17-2020 on chart Cardiologist :dr branch lov 05-11-2019 epic Chest x-ray :01-31-2019 epic EKG :01-31-2019 epic Echo :none Stress test:10-13-2014 epic Cardiac Cath : 04-11-2013 Peri vac cath 06-15-2017 epic Vas [place] aaa dulpkex 10-12-2019 epic Ct angio neck 7-20-20220 epic Neuro lov 03-01-2020 epic dr Leonie Man V[facility] lov dr Scot Dock 10-12-2019 epic Activity level: does household chores and can climb steps withour problems Sleep Study/ CPAP :n/a Fasting Blood Sugar :      / Checks Blood Sugar -- times a day:  n/a Blood Thinner/ Instructions /Last Dose:stopped plavix 07-26-2020 per dr patel instructions ASA / Instructions/ Last Dose : staying on 81 mg aspirin last dose will be 07-29-2020

## 2020-07-26 NOTE — Telephone Encounter (Addendum)
DOS 08/15/2020  EXCISION SOFT TISSUE MASS 3RD LT - 28080  RECEIVED FAX FROM [employer] - NO PRECERT REQUIRED FOR CPT 28080. REF # [id number]

## 2020-07-27 ENCOUNTER — Encounter (HOSPITAL_COMMUNITY): Payer: Self-pay | Admitting: Physician Assistant

## 2020-07-27 LAB — SARS CORONAVIRUS 2 (TAT 6-24 HRS): SARS Coronavirus 2: NEGATIVE

## 2020-07-27 NOTE — Progress Notes (Signed)
ADDENDUM to previous note by Zelphia Cairo RN done 07-26-2020.  Chart reviewed by anesthesia, Konrad Felix PA, stated pt will need cardiac clearance prior to surgery.  Called and spoke w/ Chelle, OR scheduler for Dr Posey Pronto, and let her know of needing cardiac clearance.

## 2020-07-30 ENCOUNTER — Ambulatory Visit (HOSPITAL_BASED_OUTPATIENT_CLINIC_OR_DEPARTMENT_OTHER): Admission: RE | Admit: 2020-07-30 | Payer: Medicaid Other | Source: Home / Self Care | Admitting: Podiatry

## 2020-07-30 ENCOUNTER — Other Ambulatory Visit: Payer: Self-pay | Admitting: Cardiology

## 2020-07-30 HISTORY — DX: Concussion with loss of consciousness status unknown, initial encounter: S06.0XAA

## 2020-07-30 HISTORY — DX: Benign neoplasm of peripheral nerves and autonomic nervous system, unspecified: D36.10

## 2020-07-30 HISTORY — DX: Concussion with loss of consciousness of unspecified duration, initial encounter: S06.0X9A

## 2020-07-30 SURGERY — EXCISION, MORTON'S NEUROMA
Anesthesia: General | Laterality: Left

## 2020-08-03 ENCOUNTER — Ambulatory Visit (INDEPENDENT_AMBULATORY_CARE_PROVIDER_SITE_OTHER): Payer: Medicaid Other | Admitting: Student

## 2020-08-03 ENCOUNTER — Encounter: Payer: Self-pay | Admitting: Student

## 2020-08-03 ENCOUNTER — Other Ambulatory Visit: Payer: Self-pay

## 2020-08-03 VITALS — BP 138/70 | HR 91 | Ht 69.0 in | Wt 199.4 lb

## 2020-08-03 DIAGNOSIS — E785 Hyperlipidemia, unspecified: Secondary | ICD-10-CM

## 2020-08-03 DIAGNOSIS — I2510 Atherosclerotic heart disease of native coronary artery without angina pectoris: Secondary | ICD-10-CM

## 2020-08-03 DIAGNOSIS — I10 Essential (primary) hypertension: Secondary | ICD-10-CM

## 2020-08-03 DIAGNOSIS — Z720 Tobacco use: Secondary | ICD-10-CM

## 2020-08-03 DIAGNOSIS — Z01810 Encounter for preprocedural cardiovascular examination: Secondary | ICD-10-CM

## 2020-08-03 DIAGNOSIS — I739 Peripheral vascular disease, unspecified: Secondary | ICD-10-CM

## 2020-08-03 NOTE — Progress Notes (Signed)
Cardiology Office Note    Date:  08/04/2020   ID:  Duane Adams, DOB [birth year], MRN [id number]  PCP:  Doree Albee, MD  Cardiologist: Carlyle Dolly, MD    Chief Complaint  Patient presents with  . Follow-up    Overdue Visit/ Preoperative Cardiac Clearance    History of Present Illness:    Duane Adams is a 65 y.o. male with past medical history of CAD (s/p DES to LAD and DES to RCA in 03/2013, patent stents by repeat cath in 08/2016), HTN, HLD, PAD (with known left subclavian stenosis) and tobacco use who presents to the office today for overdue follow-up and preoperative cardiac clearance for upcoming foot surgery.   He was last examined by Dr. Harl Bowie in 04/2019 and reported occasional episodes of chest pain lasting for seconds at a time but denied any exertional symptoms. He did report fatigue following recent dose adjustment of Clonidine and this was reduced to 0.2mg  BID and he was started on Amlodipine 5mg  daily. Was informed to follow-up in 4 months but has not been evaluated by Cardiology since.   In talking with the patient today, he reports overall doing well from a cardiac perspective since his last visit. He recently built a 14x10 ft shop by himself. Denies any anginal symptoms when lifting beams or carrying heavy objects. Still works with doing flooring/tile work and denies any recent chest pain or dyspnea on exertion with this. Previously had occasional shooting pains across his chest lasting for seconds at a time but no recent symptoms within the past year. No recent orthopnea, PND, lower extremity edema or palpitations. He does not exercise routinely due to hip pain and foot pain. Says he was informed by his surgeon he would not need to hold ASA or Plavix prior to his surgery.  He was previously smoking 1 ppd but has reduced his use to 5-6 cigarettes per day.    Past Medical History:  Diagnosis Date  . Allergy   . Anxiety   . Cirrhosis ([place]) from hep c  minimal scarring  . Concussion several yrs ago   no residual  . Coronary artery disease    a. 03/2013 Cath/PCI: LM nl, LAD 36m (2.5x20 Promus Premier DES), LCX min irregs, RCA dom, 90p(3.0x20 Promus Premier DES), EF 55-65%.  . CVA (cerebral vascular accident) ([place]) 01/31/2019   slight memory issues  . Essential hypertension, benign   . GERD (gastroesophageal reflux disease)   . Helicobacter pylori gastritis JUN 2016 EGD/Bx   PREVPAK BID FOR 14 DAYS  . Hepatitis    Hepatitis C treated  2011  . Hiatal hernia   . History of kidney stones   . Mixed hyperlipidemia   . Neuroma   . Prediabetes 06/23/2019  . Skin cancer   . Substance abuse ([place])    alcoholic quit [id number]  . Tobacco abuse     Past Surgical History:  Procedure Laterality Date  . ABDOMINAL AORTOGRAM W/LOWER EXTREMITY N/A 06/15/2017   Procedure: ABDOMINAL AORTOGRAM W/LOWER EXTREMITY;  Surgeon: Angelia Mould, MD;  Location: [facility];  Service: Cardiovascular;  Laterality: N/A;  Bilateral  . BIOPSY N/A 12/19/2014   Procedure: BIOPSY;  Surgeon: Danie Binder, MD;  Location: [facility];  Service: Endoscopy;  Laterality: N/A;  . COLONOSCOPY  June 2016   [facility]: with endoscopic mucosal resection. 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 Needs colonoscopy in 1 year.   . COLONOSCOPY WITH PROPOFOL N/A 12/19/2014  Dr. Oneida Alar: six simple adenomas and 3 hyperplastic polyps. Had flat mid-transverse colon polyp and referred to [facility] for endoscopic mucosal resection, which is scheduled for July 22  . CORONARY ANGIOPLASTY WITH STENT PLACEMENT  04/11/2013   LAD &  RCA     DR COOPER  . ESOPHAGOGASTRODUODENOSCOPY (EGD) WITH PROPOFOL N/A 12/19/2014   Dr. Oneida Alar:  without varices, small hiatal hernia noted, moderate non-erosive gastritis and mild duodenitis.  POSITIVE H.PYLORI. Prescribed Prevpac.  Marland Kitchen EXTRACORPOREAL SHOCK WAVE LITHOTRIPSY  2001  . FLEXIBLE SIGMOIDOSCOPY N/A 12/23/2017   Procedure: FLEXIBLE  SIGMOIDOSCOPY;  Surgeon: Danie Binder, MD;  Location: [facility];  Service: Endoscopy;  Laterality: N/A;  1:45pm  . HAND RECONSTRUCTION Left   . HEMORRHOID BANDING N/A 12/23/2017   Procedure: Thayer Jew;  Surgeon: Danie Binder, MD;  Location: [facility];  Service: Endoscopy;  Laterality: N/A;  . LEFT HEART CATHETERIZATION WITH CORONARY ANGIOGRAM N/A 04/11/2013   Procedure: LEFT HEART CATHETERIZATION WITH CORONARY ANGIOGRAM;  Surgeon: Blane Ohara, MD;  Location: [facility];  Service: Cardiovascular;  Laterality: N/A;  . LEG TENDON SURGERY Right   . POLYPECTOMY N/A 12/19/2014   Procedure: POLYPECTOMY;  Surgeon: Danie Binder, MD;  Location: [facility];  Service: Endoscopy;  Laterality: N/A;    Current Medications: Outpatient Medications Prior to Visit  Medication Sig Dispense Refill  . amLODipine (NORVASC) 5 MG tablet TAKE ONE TABLET BY MOUTH ONCE DAILY. 90 tablet 0  . amoxicillin (AMOXIL) 500 MG capsule Take 500 mg by mouth every 8 (eight) hours.    Marland Kitchen aspirin EC 81 MG tablet Take 81 mg by mouth at bedtime.     Marland Kitchen atorvastatin (LIPITOR) 40 MG tablet TAKE ONE TABLET BY MOUTH ONCE DAILY. (Patient taking differently: daily.) 30 tablet 3  . chlorthalidone (HYGROTON) 25 MG tablet TAKE 1 TABLET BY MOUTH DAILY. 30 tablet 0  . Cholecalciferol (VITAMIN D3) 2000 units TABS Take 2 tablets by mouth daily. 4000 units per day    . cloNIDine (CATAPRES) 0.2 MG tablet TAKE (1) TABLET BY MOUTH TWICE DAILY. 180 tablet 3  . clopidogrel (PLAVIX) 75 MG tablet TAKE 1 TABLET BY MOUTH ONCE DAILY. 30 tablet 11  . isosorbide mononitrate (IMDUR) 30 MG 24 hr tablet TAKE 1/2 TABLET BY MOUTH ONCE DAILY. 15 tablet 2  . metoprolol succinate (TOPROL-XL) 100 MG 24 hr tablet TAKE ONE TABLET BY MOUTH ONCE DAILY. 30 tablet 0  . Multiple Vitamin (MULTIVITAMIN WITH MINERALS) TABS tablet Take 1 tablet by mouth daily.    . nitroGLYCERIN (NITROSTAT) 0.4 MG SL tablet Place 1 tablet (0.4 mg total) under the tongue every  5 (five) minutes as needed for chest pain. 25 tablet 3  . Omega-3 1000 MG CAPS Take 1 capsule (1,000 mg total) by mouth in the morning and at bedtime. (Patient taking differently: Take 2 capsules by mouth in the morning and at bedtime.) 180 capsule 0  . pantoprazole (PROTONIX) 40 MG tablet TAKE ONE TABLET BY MOUTH ONCE DAILY. 90 tablet 3  . potassium chloride SA (KLOR-CON) 20 MEQ tablet TAKE ONE TABLET BY MOUTH ONCE DAILY. 90 tablet 3   No facility-administered medications prior to visit.     Allergies:   Lisinopril, Chantix [varenicline], and Neosporin [neomycin-bacitracin zn-polymyx]   Social History   Socioeconomic History  . Marital status: Soil scientist    Spouse name: Judie Petit  . Number of children: 2  . Years of education: 55  . Highest education level: Not on file  Occupational History  . Occupation: disabled    Comment: heart  Tobacco Use  . Smoking status: Light Tobacco Smoker    Packs/day: 0.50    Years: 40.00    Pack years: 20.00    Types: Cigarettes    Start date: 07/14/1968  . Smokeless tobacco: Never Used  . Tobacco comment: 5-6 Cigarettes per day  Vaping Use  . Vaping Use: Never used  Substance and Sexual Activity  . Alcohol use: Yes    Alcohol/week: 0.0 standard drinks    Comment: As of 11/26/17: social - 1-2 beers a sitting x 1-2 times a month  . Drug use: Yes    Types: Cocaine, Marijuana    Comment: cocaine marijuana and acid last used 1984  . Sexual activity: Not Currently  Other Topics Concern  . Not on file  Social History Narrative   Disabled from heart disease   Previously worked in Scientist, research (medical) with Judie Petit for 28 years   Leisure: Teaching laboratory technician   Social Determinants of Radio broadcast assistant Strain: Not on file  Food Insecurity: Not on file  Transportation Needs: Not on file  Physical Activity: Not on file  Stress: Not on file  Social Connections: Not on file     Family History:  The patient's family history includes Alcohol abuse in  his brother; Aneurysm in his mother; Diabetes in his paternal aunt; Heart disease in his brother and father; Hypertension in his brother, father, and mother; Stroke in his mother.   Review of Systems:   Please see the history of present illness.     General:  No chills, fever, night sweats or weight changes. Positive for joint pain.  Cardiovascular:  No chest pain, dyspnea on exertion, edema, orthopnea, palpitations, paroxysmal nocturnal dyspnea. Dermatological: No rash, lesions/masses Respiratory: No cough, dyspnea Urologic: No hematuria, dysuria Abdominal:   No nausea, vomiting, diarrhea, bright red blood per rectum, melena, or hematemesis Neurologic:  No visual changes, wkns, changes in mental status. All other systems reviewed and are otherwise negative except as noted above.   Physical Exam:    VS:  BP 138/70   Pulse 91   Ht 5\' 9"  (1.753 m)   Wt 199 lb 6.4 oz (90.4 kg)   SpO2 97%   BMI 29.45 kg/m    General: Well developed, well nourished,male appearing in no acute distress. Head: Normocephalic, atraumatic. Neck: No carotid bruits. JVD not elevated.  Lungs: Respirations regular and unlabored, without wheezes or rales.  Heart: Regular rate and rhythm. No S3 or S4.  No murmur, no rubs, or gallops appreciated. Abdomen: Appears non-distended. No obvious abdominal masses. Msk:  Strength and tone appear normal for age. No obvious joint deformities or effusions. Extremities: No clubbing or cyanosis. No lower extremity edema.  Distal pedal pulses are 2+ bilaterally. Neuro: Alert and oriented X 3. Moves all extremities spontaneously. No focal deficits noted. Psych:  Responds to questions appropriately with a normal affect. Skin: No rashes or lesions noted  Wt Readings from Last 3 Encounters:  08/03/20 199 lb 6.4 oz (90.4 kg)  07/17/20 195 lb (88.5 kg)  04/09/20 183 lb 6.4 oz (83.2 kg)      Studies/Labs Rviewed:   EKG:  EKG is ordered today. The ekg ordered today demonstrates  NSR, HR 92 with ST abnormalities along the inferior leads which is similar to prior tracings. More pronounced along Lead III and AVF but similar to 2018 tracings.   Recent Labs: 04/09/2020: ALT 18; BUN  16; Creat 0.81; Hemoglobin 15.1; Platelets 220; Potassium 3.6; Sodium 139   Lipid Panel    Component Value Date/Time   CHOL 193 07/17/2020 1332   TRIG 712 (H) 07/17/2020 1332   HDL 45 07/17/2020 1332   CHOLHDL 4.3 07/17/2020 1332   VLDL UNABLE TO CALCULATE IF TRIGLYCERIDE OVER 400 mg/dL 02/01/2019 0425   LDLCALC  07/17/2020 1332     Comment:     . LDL cholesterol not calculated. Triglyceride levels greater than 400 mg/dL invalidate calculated LDL results. . Reference range: <100 . Desirable range <100 mg/dL for primary prevention;   <70 mg/dL for patients with CHD or diabetic patients  with > or = 2 CHD risk factors. Marland Kitchen LDL-C is now calculated using the Martin-Hopkins  calculation, which is a validated novel method providing  better accuracy than the Friedewald equation in the  estimation of LDL-C.  Cresenciano Genre et al. Annamaria Helling. [id number]): 2061-2068  (http://education.[facility].com/faq/FAQ164)    LDLDIRECT 100.2 (H) 02/01/2019 0425    Additional studies/ records that were reviewed today include:   NST: 10/2014 IMPRESSION: 1. No reversible ischemia or infarction. Submaximal stress test, thus diminishing the ability of this test to detect ischemia.  2. Normal left ventricular wall motion.  3. Left ventricular ejection fraction 78%  4. Low-risk stress test findings*.  Consider Lexiscan in the future.   Cardiac Catheterization: 09/2016 Coronary Angiography  Dominance: Right   Left Main: The left main (LMCA) is a large-caliber vessel that originates  from the left coronary sinus. It bifurcates into theleft anterior  descending (LAD) and left circumflex (LCx) arteries. There is no  angiographic evidence of significant disease in the LMCA.   LAD: The LAD is a  large-caliber vessel that gives off three diagonal (D)  branches before it wraps around the apex. D1 is a medium caliber vessel  with mild luminal irregularities. D2 and D3 are small caliber vessels.  There is mild in-stent restenosis in the previously placed LAD stent up to  30%.   Left Circumflex: The LCx is a medium-caliber vessel that gives off two  obtuse marginal (OM) branches and then continues as a small vessel in the  AV groove. OM1 is a very small vessel. OM2 is a small-caliber vessel.  There is no angiographic evidence of significant disease in the LCx.   Right Coronary: The right coronary artery (RCA) is a large-caliber vessel  originating from the right coronary sinus. It bifurcates distally into the  posterior descending artery (PDA) and a posterolateral branch (PL)  consistent with a right dominant system. There are mild luminal  irregularities in the distal RCA and rPDA. The previously placed stent in  the RCA is widely patent.    Findings:  1. Non-obstructive coronary artery disease with patent previous stents in  the RCA and LAD .  2. Normal left ventricular filling pressures (LVEDP = 14 mm Hg).   Recommendations:  1. Aggressive secondary prevention.  2. Follow up with primary cardiologist.    Carotid Dopplers: 09/2019 Summary:  Right Carotid: Velocities in the right ICA are consistent with a 1-39%  stenosis.         The ECA appears >50% stenosed.   Left Carotid: Velocities in the left ICA are consistent with a 1-39%  stenosis.   Vertebrals: Right vertebral artery demonstrates antegrade flow. Left  vertebral        artery demonstrates bidirectional flow.  Subclavians: Left subclavian artery was stenotic. Normal flow hemodynamics  were  seen in the right subclavian artery.    ABI's: 09/2019 Summary:  Right: Resting right ankle-brachial index is within normal range. No  evidence of significant right lower extremity arterial  disease. The right  toe-brachial index is normal.   Left: Resting left ankle-brachial index indicates mild left lower  extremity arterial disease. The left toe-brachial index is abnormal.   Assessment:    1. Preoperative cardiovascular examination   2. Coronary artery disease involving native coronary artery of native heart without angina pectoris   3. Essential hypertension   4. Hyperlipidemia LDL goal <70   5. PAD (peripheral artery disease) ([place])   6. Tobacco use      Plan:   In order of problems listed above:  1. Preoperative Cardiac Clearance for Neurectomy of Left Foot - While he is not able to perform aerobic activity secondary to leg pain, the patient remains very active at baseline and is able to perform 4 METS of activity without any anginal symptoms. He denies any recent chest pain or changes in his breathing with this.  - His RCRI risk score is 6.6% risk of a major cardiac event given his history of CAD along with history of prior TIA. Given his current activity level and no anginal symptoms, he is of acceptable risk to proceed from a cardiac perspective. I did review his EKG with the DOD (Dr. Domenic Polite) given his inferior TWI which was on prior tracings but is more pronounced but given no recent symptoms, would not plan for repeat ischemic evaluation prior to surgery. Would recommend a repeat stress test if he developed any new symptoms. -  He informed me today that Dr. Posey Pronto told him he did not need to hold ASA or Plavix prior to his surgery. I will forward today's note to Dr. Posey Pronto to verify this.  2. CAD - He is s/p DES to LAD and DES to RCA in 03/2013 with patent stents by repeat cath in 08/2016.  He denies any recent anginal symptoms. - Remains on ASA, Plavix (has remained on this due to multiple stents and PAD by review of notes), Toprol-XL 100mg  daily, Imdur 15mg  daily and Atorvastatin 40mg  daily.   3. HTN - BP is at 138/70 during today's visit which is much improved  when compared to prior office visits. Continue current medication regimen with Amlodipine 5 mg daily, Chlorthalidone 25 mg daily, Clonidine 0.2 mg twice daily, Imdur 15 mg daily and Toprol-XL 100 mg daily.   4. HLD - Recent FLP from his PCP showed total cholesterol is well controlled at 193 with HDL at 45 but he did have significant hypertriglyceridemia with triglycerides elevated to 712. He was not fasting for the sample and has been started on fish oil by his PCP. Would recommend a repeat FLP in 6-8 weeks for reassessment. If no improvement, would consider referral to the [facility] for additional evaluation as he was previously intolerant to Crestor and did not tolerate a higher of Atorvastatin. Could also consider the use of Vascepa if covered by his insurance.   5. PAD - He is followed by Vascular Surgery. Has known left subclavian stenosis along with left lower extremity arterial disease. Remains on ASA, Plavix and statin therapy.   6. Tobacco Use - Was previously smoking 1 ppd but has reduced his use to 5-6 cigarettes per day. Currently using nicotine gum. He was congratulated on his reduction with full cessation advised.    Medication Adjustments/Labs and Tests Ordered: Current medicines are reviewed  at length with the patient today.  Concerns regarding medicines are outlined above.  Medication changes, Labs and Tests ordered today are listed in the Patient Instructions below. Patient Instructions  Medication Instructions:  Your physician recommends that you continue on your current medications as directed. Please refer to the Current Medication list given to you today.  *If you need a refill on your cardiac medications before your next appointment, please call your pharmacy*   Lab Work: None Today If you have labs (blood work) drawn today and your tests are completely normal, you will receive your results only by: Marland Kitchen MyChart Message (if you have MyChart) OR . A paper copy in the  mail If you have any lab test that is abnormal or we need to change your treatment, we will call you to review the results.   Testing/Procedures: None Today   Follow-Up: At [facility], you and your health needs are our priority.  As part of our continuing mission to provide you with exceptional heart care, we have created designated Provider Care Teams.  These Care Teams include your primary Cardiologist (physician) and Advanced Practice Providers (APPs -  Physician Assistants and Nurse Practitioners) who all work together to provide you with the care you need, when you need it.  We recommend signing up for the patient portal called "MyChart".  Sign up information is provided on this After Visit Summary.  MyChart is used to connect with patients for Virtual Visits (Telemedicine).  Patients are able to view lab/test results, encounter notes, upcoming appointments, etc.  Non-urgent messages can be sent to your provider as well.   To learn more about what you can do with MyChart, go to [email or web address].    Your next appointment:   12 month(s)  The format for your next appointment:   In Person  Provider:   Carlyle Dolly, MD   Other Instructions You have been cleared for surgery.       Signed, Erma Heritage, PA-C  08/04/2020 10:10 AM    [facility] [address] [place], [place] [address] Phone: [phone] Fax: [phone]

## 2020-08-03 NOTE — Patient Instructions (Signed)
Medication Instructions:  Your physician recommends that you continue on your current medications as directed. Please refer to the Current Medication list given to you today.  *If you need a refill on your cardiac medications before your next appointment, please call your pharmacy*   Lab Work: None Today If you have labs (blood work) drawn today and your tests are completely normal, you will receive your results only by:  [place] (if you have MyChart) OR  A paper copy in the mail If you have any lab test that is abnormal or we need to change your treatment, we will call you to review the results.   Testing/Procedures: None Today   Follow-Up: At [facility], you and your health needs are our priority.  As part of our continuing mission to provide you with exceptional heart care, we have created designated Provider Care Teams.  These Care Teams include your primary Cardiologist (physician) and Advanced Practice Providers (APPs -  Physician Assistants and Nurse Practitioners) who all work together to provide you with the care you need, when you need it.  We recommend signing up for the patient portal called "MyChart".  Sign up information is provided on this After Visit Summary.  MyChart is used to connect with patients for Virtual Visits (Telemedicine).  Patients are able to view lab/test results, encounter notes, upcoming appointments, etc.  Non-urgent messages can be sent to your provider as well.   To learn more about what you can do with MyChart, go to [email or web address].    Your next appointment:   12 month(s)  The format for your next appointment:   In Person  Provider:   Carlyle Dolly, MD   Other Instructions You have been cleared for surgery by Bernerd Pho, PA-C

## 2020-08-06 ENCOUNTER — Ambulatory Visit: Payer: Medicaid Other | Admitting: Family Medicine

## 2020-08-06 ENCOUNTER — Ambulatory Visit: Payer: Medicaid Other

## 2020-08-06 ENCOUNTER — Encounter: Payer: Self-pay | Admitting: Orthopedic Surgery

## 2020-08-06 ENCOUNTER — Ambulatory Visit (INDEPENDENT_AMBULATORY_CARE_PROVIDER_SITE_OTHER): Payer: Medicaid Other | Admitting: Orthopedic Surgery

## 2020-08-06 ENCOUNTER — Other Ambulatory Visit: Payer: Self-pay

## 2020-08-06 VITALS — BP 178/90 | HR 90 | Ht 69.0 in | Wt 194.0 lb

## 2020-08-06 DIAGNOSIS — M25551 Pain in right hip: Secondary | ICD-10-CM

## 2020-08-06 DIAGNOSIS — M48062 Spinal stenosis, lumbar region with neurogenic claudication: Secondary | ICD-10-CM | POA: Diagnosis not present

## 2020-08-06 DIAGNOSIS — M545 Low back pain, unspecified: Secondary | ICD-10-CM

## 2020-08-06 DIAGNOSIS — G8929 Other chronic pain: Secondary | ICD-10-CM

## 2020-08-06 DIAGNOSIS — M25552 Pain in left hip: Secondary | ICD-10-CM

## 2020-08-06 NOTE — Progress Notes (Signed)
NEW PROBLEM//OFFICE VISIT  Summary assessment and plan:   65 yo male with CAD, unfortunately I have nothing to offer him; needs to see spine specialist for neurogenic claudication; hips are normal  Lumbar spine x-rays were notable for degenerative disc disease   Chief Complaint  Patient presents with  . Hip Pain    Patient reports bilateral hip pain, worse when walking, Patient reports feels achy at bedtime, left worse than,     65 year old male with bilateral hip pain and multiple medical problems including coronary artery disease status post cath in 2014 currently on Plavix history of hypertension had a CVA in 2020.  He indicates that he feels pain on the sides of his hips right and left denies any back pain but does indicate that he can only walk about 5 to 10 minutes in its persistently that amount of time before he has to stop walking.  Says he cannot walk in the grocery store.  He says he has had some back trouble in the past.  Trying to quit smoking   Review of Systems  Constitutional: Negative for fever.  Musculoskeletal: Positive for back pain.  Skin: Negative.   Neurological: Negative for tingling, sensory change and focal weakness.     Past Medical History:  Diagnosis Date  . Allergy   . Anxiety   . 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 Concussion several yrs ago   no residual  . Coronary artery disease    a. 03/2013 Cath/PCI: LM nl, LAD 46m (2.5x20 Promus Premier DES), LCX min irregs, RCA dom, 90p(3.0x20 Promus Premier DES), EF 55-65%.  . CVA (cerebral vascular accident) ([place]) 01/31/2019   slight memory issues  . Essential hypertension, benign   . GERD (gastroesophageal reflux disease)   . Helicobacter pylori gastritis JUN 2016 EGD/Bx   PREVPAK BID FOR 14 DAYS  . Hepatitis    Hepatitis C treated  2011  . Hiatal hernia   . History of kidney stones   . Mixed hyperlipidemia   . Neuroma   . Prediabetes 06/23/2019  . Skin cancer   . Substance abuse  ([place])    alcoholic quit 1308  . Tobacco abuse     Past Surgical History:  Procedure Laterality Date  . ABDOMINAL AORTOGRAM W/LOWER EXTREMITY N/A 06/15/2017   Procedure: ABDOMINAL AORTOGRAM W/LOWER EXTREMITY;  Surgeon: Angelia Mould, MD;  Location: [facility];  Service: Cardiovascular;  Laterality: N/A;  Bilateral  . BIOPSY N/A 12/19/2014   Procedure: BIOPSY;  Surgeon: Danie Binder, MD;  Location: [facility];  Service: Endoscopy;  Laterality: N/A;  . COLONOSCOPY  June 2016   [facility]: with endoscopic mucosal resection. 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 Needs colonoscopy in 1 year.   . COLONOSCOPY WITH PROPOFOL N/A 12/19/2014   Dr. Oneida Alar: six simple adenomas and 3 hyperplastic polyps. Had flat mid-transverse colon polyp and referred to [facility] for endoscopic mucosal resection, which is scheduled for July 22  . CORONARY ANGIOPLASTY WITH STENT PLACEMENT  04/11/2013   LAD &  RCA     DR COOPER  . ESOPHAGOGASTRODUODENOSCOPY (EGD) WITH PROPOFOL N/A 12/19/2014   Dr. Oneida Alar:  without varices, small hiatal hernia noted, moderate non-erosive gastritis and mild duodenitis.  POSITIVE H.PYLORI. Prescribed Prevpac.  Marland Kitchen EXTRACORPOREAL SHOCK WAVE LITHOTRIPSY  2001  . FLEXIBLE SIGMOIDOSCOPY N/A 12/23/2017   Procedure: FLEXIBLE SIGMOIDOSCOPY;  Surgeon: Danie Binder, MD;  Location: [facility];  Service: Endoscopy;  Laterality: N/A;  1:45pm  . HAND RECONSTRUCTION Left   . HEMORRHOID BANDING N/A 12/23/2017   Procedure: Thayer Jew;  Surgeon: Danie Binder, MD;  [facility]: [facility];  Service: Endoscopy;  Laterality: N/A;  . LEFT HEART CATHETERIZATION WITH CORONARY ANGIOGRAM N/A 04/11/2013   Procedure: LEFT HEART CATHETERIZATION WITH CORONARY ANGIOGRAM;  Surgeon: Blane Ohara, MD;  Location: [facility];  Service: Cardiovascular;  Laterality: N/A;  . LEG TENDON SURGERY Right   . POLYPECTOMY N/A 12/19/2014   Procedure: POLYPECTOMY;  Surgeon: Danie Binder, MD;   Location: [facility];  Service: Endoscopy;  Laterality: N/A;    Family History  Problem Relation Age of Onset  . Stroke Mother   . Aneurysm Mother   . Hypertension Mother   . Heart disease Father   . Hypertension Father   . Heart disease Brother   . Hypertension Brother   . Alcohol abuse Brother   . Diabetes Paternal Aunt   . Colon cancer Neg Hx    Social History   Tobacco Use  . Smoking status: Light Tobacco Smoker    Packs/day: 0.50    Years: 40.00    Pack years: 20.00    Types: Cigarettes    Start date: 07/14/1968  . Smokeless tobacco: Never Used  . Tobacco comment: 5-6 Cigarettes per day  Vaping Use  . Vaping Use: Never used  Substance Use Topics  . Alcohol use: Yes    Alcohol/week: 0.0 standard drinks    Comment: As of 11/26/17: social - 1-2 beers a sitting x 1-2 times a month  . Drug use: Yes    Types: Cocaine, Marijuana    Comment: cocaine marijuana and acid last used 1984    Allergies  Allergen Reactions  . Lisinopril Swelling    Swelling neck  . Chantix [Varenicline] Other (See Comments)    Abdominal pain  . Neosporin [Neomycin-Bacitracin Zn-Polymyx] Rash    blistered    Current Meds  Medication Sig  . amLODipine (NORVASC) 5 MG tablet TAKE ONE TABLET BY MOUTH ONCE DAILY.  Marland Kitchen aspirin EC 81 MG tablet Take 81 mg by mouth at bedtime.   Marland Kitchen atorvastatin (LIPITOR) 40 MG tablet TAKE ONE TABLET BY MOUTH ONCE DAILY. (Patient taking differently: daily.)  . chlorthalidone (HYGROTON) 25 MG tablet TAKE 1 TABLET BY MOUTH DAILY.  Marland Kitchen Cholecalciferol (VITAMIN D3) 2000 units TABS Take 2 tablets by mouth daily. 4000 units per day  . cloNIDine (CATAPRES) 0.2 MG tablet TAKE (1) TABLET BY MOUTH TWICE DAILY.  Marland Kitchen clopidogrel (PLAVIX) 75 MG tablet TAKE 1 TABLET BY MOUTH ONCE DAILY.  . isosorbide mononitrate (IMDUR) 30 MG 24 hr tablet TAKE 1/2 TABLET BY MOUTH ONCE DAILY.  . metoprolol succinate (TOPROL-XL) 100 MG 24 hr tablet TAKE ONE TABLET BY MOUTH ONCE DAILY.  . Multiple Vitamin  (MULTIVITAMIN WITH MINERALS) TABS tablet Take 1 tablet by mouth daily.  . nitroGLYCERIN (NITROSTAT) 0.4 MG SL tablet Place 1 tablet (0.4 mg total) under the tongue every 5 (five) minutes as needed for chest pain.  . Omega-3 1000 MG CAPS Take 1 capsule (1,000 mg total) by mouth in the morning and at bedtime. (Patient taking differently: Take 2 capsules by mouth in the morning and at bedtime.)  . pantoprazole (PROTONIX) 40 MG tablet TAKE ONE TABLET BY MOUTH ONCE DAILY.  Marland Kitchen potassium chloride SA (KLOR-CON) 20 MEQ tablet TAKE ONE TABLET BY MOUTH ONCE DAILY.    BP (!) 178/90   Pulse 90   Ht 5\' 9"  (1.753  m)   Wt 194 lb (88 kg)   BMI 28.65 kg/m   Physical Exam Vitals and nursing note reviewed.  Constitutional:      Appearance: Normal appearance.  HENT:     Head: Normocephalic and atraumatic.  Cardiovascular:     Rate and Rhythm: Normal rate and regular rhythm.  Skin:    General: Skin is warm and dry.     Capillary Refill: Capillary refill takes less than 2 seconds.  Neurological:     General: No focal deficit present.     Mental Status: He is alert.  Psychiatric:        Mood and Affect: Mood normal.     Ortho Exam  Hip examination Leg lengths are equal Hip range of motion is normal and without pain Proximal strength of the hip is normal He is tender in his lower back on both sides as well as the iliac crest and upper part of the glutei medius and minimus    MEDICAL DECISION MAKING  A.  Encounter Diagnoses  Name Primary?  . Pain in right hip   . Pain in left hip   . Chronic bilateral low back pain without sciatica   . Spinal stenosis of lumbar region with neurogenic claudication Yes    B. DATA ANALYSED:   IMAGING: Interpretation of images:  AP pelvis normal pelvis AP and lateral right hip normal hip  AP and lateral left hip normal hip  Lumbar images degenerative disc disease  C. MANAGEMENT   Referral to primary care to refer to Spine specialist   No orders  of the defined types were placed in this encounter.     Arther Abbott, MD  08/06/2020 11:04 AM

## 2020-08-06 NOTE — Patient Instructions (Signed)
Leslie Dales and Winn neurological surgery (7th ed., pp. [phone]). [place], [place]: Elsevier.">  Spinal Stenosis  Spinal stenosis happens when the spinal canal gets smaller. The spinal canal is the space between the bones of your spine (vertebrae). As the canal gets smaller, the nerves that pass that part of the spine are pressed. This causes pain. Spinal stenosis can affect your neck, upper back, or lower back. What are the causes? This condition is caused by parts of bone that push into your spinal canal. This problem may start before birth. If it occurs after birth, the cause may be:  Breakdown of bones of your spine. This normally starts around 64 years of age.  Injury to your spine.  Tumors in your spine.  A buildup of calcium in your spine. What increases the risk? You are more likely to develop this condition if:  You are older than age 25.  You were born with a problem in your spine, such as a curved spine (scoliosis).  You have arthritis. This is disease of your joints. What are the signs or symptoms? Common symptoms of this condition include:  Pain in your neck or back. The pain may be worse when you stand or walk.  Problems with your legs. A leg may lose feeling, tingle, or turn hot or cold.  Pain that goes from your butt down to your lower leg (sciatica).  Falling a lot.  Slapping your foot down when you walk. This weakens muscles. Severe symptoms of this condition include:  Problems pooping or peeing.  Trouble having sex.  Loss of feeling in your leg.  Being unable to walk. Sometimes there are no symptoms. How is this treated? To treat pain and manage symptoms, you may be asked to:  Practice sitting and standing up straight. This is good posture.  Exercise.  Lose weight, if needed.  Take medicines or get shots.  Support your back with a corset or a brace. In some cases, you may need to have surgery. Follow these instructions at home: Managing pain,  stiffness, and swelling  Stand and sit up straight. If you have a brace or a corset, wear it as told.  Keep a healthy weight. Talk with your doctor if you need help losing weight.  If told, put heat on the affected area. Do this as often as told by your doctor. Use the heat source that your doctor recommends, such as a moist heat pack or a heating pad. ? Place a towel between your skin and the heat source. ? Leave the heat on for 20-30 minutes. ? Take off the heat if your skin turns bright red. This is very important if you are unable to feel pain, heat, or cold. You have a greater risk of getting burned.   Activity  Do exercises as told by your doctor.  Do not do activities that cause pain. Ask your doctor what activities are safe for you.  Do not lift anything that is heavier than 10 lb (4.5 kg), or the limit that you are told by your doctor.  Return to your normal activities as told by your doctor. Ask your doctor what activities are safe for you. General instructions  Take over-the-counter and prescription medicines only as told by your doctor.  Do not use any products that contain nicotine or tobacco, such as cigarettes, e-cigarettes, and chewing tobacco. If you need help quitting, ask your doctor.  Eat a healthy diet. Eat a lot of: ? Fruits. ? Vegetables. ?  Whole grains. ? Low-fat (lean) protein.  Keep all follow-up visits as told by your doctor. This is important. Contact a doctor if:  Your symptoms do not get better.  Your symptoms get worse.  You have a fever. Get help right away if:  You have new pain or very bad pain in your neck or upper back.  You have very bad pain, and medicine does not help.  You have a very bad headache.  You are dizzy.  You do not see well.  You vomit or feel like you may vomit.  You have these things in your back or legs: ? New or worse loss of feeling. ? New or worse tingling.  Your arm or leg: ? Hurts or swells. ? Turns  red. ? Feels warm. Summary  Spinal stenosis happens when the space between the bones of the spine gets smaller. The small space puts pressure on the nerves in your spine.  This condition may start before birth. Breakdown of bones, injuries, or tumors can cause this condition after birth.  Spinal stenosis can cause pain in the neck, back, legs, or butt. A leg may lose feeling, tingle, or turn hot or cold.  Treatment for this condition focuses on lessening your pain and other symptoms. This information is not intended to replace advice given to you by your health care provider. Make sure you discuss any questions you have with your health care provider. Document Revised: 04/28/2019 Document Reviewed: 04/28/2019 Elsevier Patient Education  2021 [employer].

## 2020-08-08 ENCOUNTER — Encounter: Payer: Medicaid Other | Admitting: Podiatry

## 2020-08-09 ENCOUNTER — Encounter (HOSPITAL_BASED_OUTPATIENT_CLINIC_OR_DEPARTMENT_OTHER): Payer: Self-pay | Admitting: Podiatry

## 2020-08-09 ENCOUNTER — Other Ambulatory Visit: Payer: Self-pay

## 2020-08-13 ENCOUNTER — Encounter (HOSPITAL_BASED_OUTPATIENT_CLINIC_OR_DEPARTMENT_OTHER)
Admission: RE | Admit: 2020-08-13 | Discharge: 2020-08-13 | Disposition: A | Discharge status: Home / Self Care | Payer: Medicaid Other | Source: Ambulatory Visit | Attending: Podiatry | Admitting: Podiatry

## 2020-08-13 ENCOUNTER — Other Ambulatory Visit (HOSPITAL_BASED_OUTPATIENT_CLINIC_OR_DEPARTMENT_OTHER)
Admission: RE | Admit: 2020-08-13 | Discharge: 2020-08-13 | Disposition: A | Discharge status: Home / Self Care | Payer: Medicaid Other | Source: Ambulatory Visit | Attending: Podiatry | Admitting: Podiatry

## 2020-08-13 DIAGNOSIS — Z20822 Contact with and (suspected) exposure to covid-19: Secondary | ICD-10-CM | POA: Insufficient documentation

## 2020-08-13 DIAGNOSIS — G5762 Lesion of plantar nerve, left lower limb: Secondary | ICD-10-CM | POA: Diagnosis not present

## 2020-08-13 DIAGNOSIS — F17210 Nicotine dependence, cigarettes, uncomplicated: Secondary | ICD-10-CM | POA: Diagnosis not present

## 2020-08-13 DIAGNOSIS — I2510 Atherosclerotic heart disease of native coronary artery without angina pectoris: Secondary | ICD-10-CM | POA: Diagnosis not present

## 2020-08-13 DIAGNOSIS — Z01812 Encounter for preprocedural laboratory examination: Secondary | ICD-10-CM | POA: Insufficient documentation

## 2020-08-13 DIAGNOSIS — Z888 Allergy status to other drugs, medicaments and biological substances status: Secondary | ICD-10-CM | POA: Diagnosis not present

## 2020-08-13 DIAGNOSIS — I10 Essential (primary) hypertension: Secondary | ICD-10-CM | POA: Diagnosis not present

## 2020-08-13 LAB — BASIC METABOLIC PANEL
Anion gap: 10 (ref 5–15)
BUN: 12 mg/dL (ref 8–23)
CO2: 27 mmol/L (ref 22–32)
Calcium: 9.5 mg/dL (ref 8.9–10.3)
Chloride: 101 mmol/L (ref 98–111)
Creatinine, Ser: 0.77 mg/dL (ref 0.61–1.24)
GFR, Estimated: >60 mL/min (ref >60)
Glucose, Bld: 114 mg/dL — ABNORMAL HIGH (ref 70–99)
Potassium: 4.3 mmol/L (ref 3.5–5.1)
Sodium: 138 mmol/L (ref 135–145)

## 2020-08-13 LAB — SARS CORONAVIRUS 2 (TAT 6-24 HRS): SARS Coronavirus 2: NEGATIVE

## 2020-08-15 ENCOUNTER — Encounter (HOSPITAL_BASED_OUTPATIENT_CLINIC_OR_DEPARTMENT_OTHER): Payer: Self-pay | Admitting: Podiatry

## 2020-08-15 ENCOUNTER — Ambulatory Visit (HOSPITAL_BASED_OUTPATIENT_CLINIC_OR_DEPARTMENT_OTHER): Payer: Medicaid Other | Admitting: Anesthesiology

## 2020-08-15 ENCOUNTER — Encounter (HOSPITAL_BASED_OUTPATIENT_CLINIC_OR_DEPARTMENT_OTHER)
Admission: RE | Disposition: A | Discharge status: Home / Self Care | Payer: Self-pay | Source: Home / Self Care | Attending: Podiatry

## 2020-08-15 ENCOUNTER — Ambulatory Visit (HOSPITAL_BASED_OUTPATIENT_CLINIC_OR_DEPARTMENT_OTHER)
Admission: RE | Admit: 2020-08-15 | Discharge: 2020-08-15 | Disposition: A | Discharge status: Home / Self Care | Payer: Medicaid Other | Attending: Podiatry | Admitting: Podiatry

## 2020-08-15 ENCOUNTER — Other Ambulatory Visit (INDEPENDENT_AMBULATORY_CARE_PROVIDER_SITE_OTHER): Payer: Self-pay | Admitting: Internal Medicine

## 2020-08-15 ENCOUNTER — Other Ambulatory Visit: Payer: Self-pay | Admitting: Podiatry

## 2020-08-15 ENCOUNTER — Other Ambulatory Visit: Payer: Self-pay

## 2020-08-15 DIAGNOSIS — G5762 Lesion of plantar nerve, left lower limb: Secondary | ICD-10-CM | POA: Diagnosis not present

## 2020-08-15 DIAGNOSIS — F17210 Nicotine dependence, cigarettes, uncomplicated: Secondary | ICD-10-CM | POA: Diagnosis not present

## 2020-08-15 DIAGNOSIS — I2510 Atherosclerotic heart disease of native coronary artery without angina pectoris: Secondary | ICD-10-CM | POA: Diagnosis not present

## 2020-08-15 DIAGNOSIS — Z888 Allergy status to other drugs, medicaments and biological substances status: Secondary | ICD-10-CM | POA: Insufficient documentation

## 2020-08-15 DIAGNOSIS — I10 Essential (primary) hypertension: Secondary | ICD-10-CM | POA: Diagnosis not present

## 2020-08-15 DIAGNOSIS — E782 Mixed hyperlipidemia: Secondary | ICD-10-CM | POA: Diagnosis not present

## 2020-08-15 HISTORY — PX: EXCISION MORTON'S NEUROMA: SHX5013

## 2020-08-15 SURGERY — EXCISION, MORTON'S NEUROMA
Anesthesia: General | Site: Foot | Laterality: Left

## 2020-08-15 MED ORDER — MIDAZOLAM HCL 2 MG/2ML IJ SOLN
INTRAMUSCULAR | Status: AC
  Filled 2020-08-15: qty 2

## 2020-08-15 MED ORDER — FENTANYL CITRATE (PF) 100 MCG/2ML IJ SOLN
INTRAMUSCULAR | Status: AC
  Filled 2020-08-15: qty 2

## 2020-08-15 MED ORDER — ONDANSETRON HCL 4 MG/2ML IJ SOLN
INTRAMUSCULAR | Status: DC | PRN
  Administered 2020-08-15: 4 mg via INTRAVENOUS

## 2020-08-15 MED ORDER — BUPIVACAINE HCL (PF) 0.5 % IJ SOLN
INTRAMUSCULAR | Status: DC | PRN
  Administered 2020-08-15: 9 mL

## 2020-08-15 MED ORDER — LIDOCAINE 2% (20 MG/ML) 5 ML SYRINGE
INTRAMUSCULAR | Status: AC
  Filled 2020-08-15: qty 5

## 2020-08-15 MED ORDER — FENTANYL CITRATE (PF) 100 MCG/2ML IJ SOLN: 25.0000 ug | INTRAMUSCULAR | Status: DC | PRN

## 2020-08-15 MED ORDER — ONDANSETRON HCL 4 MG/2ML IJ SOLN
INTRAMUSCULAR | Status: AC
  Filled 2020-08-15: qty 2

## 2020-08-15 MED ORDER — ONDANSETRON HCL 4 MG/2ML IJ SOLN: 4.0000 mg | Freq: Once | INTRAMUSCULAR | Status: DC | PRN

## 2020-08-15 MED ORDER — CEFAZOLIN SODIUM-DEXTROSE 2-4 GM/100ML-% IV SOLN
INTRAVENOUS | Status: AC
  Filled 2020-08-15: qty 100

## 2020-08-15 MED ORDER — MIDAZOLAM HCL 5 MG/5ML IJ SOLN
INTRAMUSCULAR | Status: DC | PRN
  Administered 2020-08-15: 2 mg via INTRAVENOUS

## 2020-08-15 MED ORDER — OXYCODONE HCL 5 MG PO TABS: 5.0000 mg | ORAL_TABLET | Freq: Once | ORAL | Status: DC | PRN

## 2020-08-15 MED ORDER — OXYCODONE HCL 5 MG/5ML PO SOLN: 5.0000 mg | Freq: Once | ORAL | Status: DC | PRN

## 2020-08-15 MED ORDER — CEFAZOLIN SODIUM-DEXTROSE 2-4 GM/100ML-% IV SOLN
2.0000 g | Freq: Once | INTRAVENOUS | Status: AC
  Administered 2020-08-15: 2 g via INTRAVENOUS

## 2020-08-15 MED ORDER — FENTANYL CITRATE (PF) 100 MCG/2ML IJ SOLN
INTRAMUSCULAR | Status: DC | PRN
  Administered 2020-08-15 (×2): 50 ug via INTRAVENOUS

## 2020-08-15 MED ORDER — BUPIVACAINE HCL (PF) 0.5 % IJ SOLN
INTRAMUSCULAR | Status: AC
  Filled 2020-08-15: qty 30

## 2020-08-15 MED ORDER — DEXAMETHASONE SODIUM PHOSPHATE 10 MG/ML IJ SOLN
INTRAMUSCULAR | Status: AC
  Filled 2020-08-15: qty 1

## 2020-08-15 MED ORDER — CEFAZOLIN SODIUM-DEXTROSE 2-4 GM/100ML-% IV SOLN: 2.0000 g | Freq: Once | INTRAVENOUS | Status: DC

## 2020-08-15 MED ORDER — LACTATED RINGERS IV SOLN: INTRAVENOUS | Status: DC

## 2020-08-15 MED ORDER — DEXAMETHASONE SODIUM PHOSPHATE 10 MG/ML IJ SOLN
INTRAMUSCULAR | Status: DC | PRN
  Administered 2020-08-15: 6 mg via INTRAVENOUS

## 2020-08-15 MED ORDER — OXYCODONE-ACETAMINOPHEN 10-325 MG PO TABS: 1.0000 | ORAL_TABLET | ORAL | 0 refills | Status: DC | PRN

## 2020-08-15 MED ORDER — LIDOCAINE 2% (20 MG/ML) 5 ML SYRINGE
INTRAMUSCULAR | Status: DC | PRN
  Administered 2020-08-15: 60 mg via INTRAVENOUS

## 2020-08-15 MED ORDER — 0.9 % SODIUM CHLORIDE (POUR BTL) OPTIME
TOPICAL | Status: DC | PRN
  Administered 2020-08-15: 200 mL

## 2020-08-15 MED ORDER — PROPOFOL 10 MG/ML IV BOLUS
INTRAVENOUS | Status: DC | PRN
  Administered 2020-08-15: 200 mg via INTRAVENOUS

## 2020-08-15 SURGICAL SUPPLY — 66 items
APL PRP STRL LF DISP 70% ISPRP (MISCELLANEOUS) ×1
BLADE HEX COATED 2.75 (ELECTRODE) IMPLANT
BLADE SURG 15 STRL LF DISP TIS (BLADE) ×3 IMPLANT
BLADE SURG 15 STRL SS (BLADE) ×6
BNDG CMPR 9X4 STRL LF SNTH (GAUZE/BANDAGES/DRESSINGS) ×1
BNDG ELASTIC 3X5.8 VLCR STR LF (GAUZE/BANDAGES/DRESSINGS) ×2 IMPLANT
BNDG ELASTIC 4X5.8 VLCR STR LF (GAUZE/BANDAGES/DRESSINGS) IMPLANT
BNDG ELASTIC 6X5.8 VLCR STR LF (GAUZE/BANDAGES/DRESSINGS) IMPLANT
BNDG ESMARK 4X9 LF (GAUZE/BANDAGES/DRESSINGS) ×2 IMPLANT
BNDG GAUZE ELAST 4 BULKY (GAUZE/BANDAGES/DRESSINGS) ×2 IMPLANT
CHLORAPREP W/TINT 26 (MISCELLANEOUS) ×2 IMPLANT
COVER BACK TABLE 60X90IN (DRAPES) ×2 IMPLANT
COVER MAYO STAND STRL (DRAPES) IMPLANT
COVER WAND RF STERILE (DRAPES) IMPLANT
CUFF TOURN SGL QUICK 24 (TOURNIQUET CUFF)
CUFF TOURN SGL QUICK 34 (TOURNIQUET CUFF)
CUFF TRNQT CYL 24X4X16.5-23 (TOURNIQUET CUFF) IMPLANT
CUFF TRNQT CYL 34X4.125X (TOURNIQUET CUFF) IMPLANT
DRAPE EXTREMITY T 121X128X90 (DISPOSABLE) ×2 IMPLANT
DRAPE IMP U-DRAPE 54X76 (DRAPES) ×2 IMPLANT
DRAPE U-SHAPE 47X51 STRL (DRAPES) ×2 IMPLANT
DRSG ADAPTIC 3X8 NADH LF (GAUZE/BANDAGES/DRESSINGS) IMPLANT
DRSG PAD ABDOMINAL 8X10 ST (GAUZE/BANDAGES/DRESSINGS) IMPLANT
ELECT REM PT RETURN 9FT ADLT (ELECTROSURGICAL) ×2
ELECTRODE REM PT RTRN 9FT ADLT (ELECTROSURGICAL) ×1 IMPLANT
GAUZE 4X4 16PLY RFD (DISPOSABLE) IMPLANT
GAUZE SPONGE 4X4 12PLY STRL (GAUZE/BANDAGES/DRESSINGS) ×2 IMPLANT
GAUZE XEROFORM 1X8 LF (GAUZE/BANDAGES/DRESSINGS) ×2 IMPLANT
GLOVE SURG ENC MOIS LTX SZ7 (GLOVE) ×2 IMPLANT
GLOVE SURG UNDER POLY LF SZ7.5 (GLOVE) ×2 IMPLANT
GOWN STRL REUS W/ TWL LRG LVL3 (GOWN DISPOSABLE) ×1 IMPLANT
GOWN STRL REUS W/TWL LRG LVL3 (GOWN DISPOSABLE) ×2
GOWN STRL REUS W/TWL XL LVL3 (GOWN DISPOSABLE) ×2 IMPLANT
NDL HYPO 25X1 1.5 SAFETY (NEEDLE) ×1 IMPLANT
NEEDLE HYPO 25X1 1.5 SAFETY (NEEDLE) ×2 IMPLANT
NS IRRIG 1000ML POUR BTL (IV SOLUTION) IMPLANT
PACK BASIN DAY SURGERY FS (CUSTOM PROCEDURE TRAY) ×2 IMPLANT
PAD CAST 4YDX4 CTTN HI CHSV (CAST SUPPLIES) ×1 IMPLANT
PADDING CAST ABS 4INX4YD NS (CAST SUPPLIES) ×1
PADDING CAST ABS COTTON 4X4 ST (CAST SUPPLIES) ×1 IMPLANT
PADDING CAST COTTON 4X4 STRL (CAST SUPPLIES) ×2
PENCIL SMOKE EVACUATOR (MISCELLANEOUS) ×2 IMPLANT
SHEET MEDIUM DRAPE 40X70 STRL (DRAPES) ×2 IMPLANT
SLEEVE SCD COMPRESS KNEE MED (MISCELLANEOUS) ×2 IMPLANT
SPONGE LAP 4X18 RFD (DISPOSABLE) IMPLANT
STOCKINETTE 6  STRL (DRAPES) ×2
STOCKINETTE 6 STRL (DRAPES) ×1 IMPLANT
STOCKINETTE SYNTHETIC 4 NONSTR (MISCELLANEOUS) ×2 IMPLANT
SUCTION FRAZIER HANDLE 10FR (MISCELLANEOUS)
SUCTION TUBE FRAZIER 10FR DISP (MISCELLANEOUS) IMPLANT
SUT MERSILENE 4 0 P 3 (SUTURE) IMPLANT
SUT MNCRL AB 3-0 PS2 18 (SUTURE) ×2 IMPLANT
SUT MNCRL AB 4-0 PS2 18 (SUTURE) ×2 IMPLANT
SUT MON AB 5-0 PS2 18 (SUTURE) IMPLANT
SUT PROLENE 3 0 PS 2 (SUTURE) ×2 IMPLANT
SUT PROLENE 4 0 PS 2 18 (SUTURE) IMPLANT
SUT VIC AB 2-0 SH 27 (SUTURE)
SUT VIC AB 2-0 SH 27XBRD (SUTURE) IMPLANT
SUT VIC AB 3-0 PS2 18 (SUTURE) IMPLANT
SUT VICRYL 4-0 PS2 18IN ABS (SUTURE) IMPLANT
SYR BULB EAR ULCER 3OZ GRN STR (SYRINGE) ×2 IMPLANT
SYR CONTROL 10ML LL (SYRINGE) IMPLANT
TOWEL GREEN STERILE FF (TOWEL DISPOSABLE) ×2 IMPLANT
TUBE CONNECTING 20X1/4 (TUBING) IMPLANT
UNDERPAD 30X36 HEAVY ABSORB (UNDERPADS AND DIAPERS) ×2 IMPLANT
YANKAUER SUCT BULB TIP NO VENT (SUCTIONS) IMPLANT

## 2020-08-15 NOTE — Op Note (Addendum)
Surgeon: Surgeon(s): [name], DPM  Assistants: None Pre-operative diagnosis: Mortons Neuroma Left Foot  Post-operative diagnosis: same Procedure: Procedure(s) (LRB): EXCISION MORTON'S NEUROMA THIRD TOE LEFT FOOT (Left)  Pathology:  ID Type Source Tests Collected by Time Destination  1 : Mortons neuroma left foot third interspace Tissue PATH Soft tissue SURGICAL PATHOLOGY [name], DPM 08/15/2020 1242     Pertinent Intra-op findings: Neuroma noted of the left third interspace Anesthesia: General  Hemostasis: Calf tourniquet 15 minutes EBL: 3 mL  Materials: 3-0 Prolene, 3-0 Monocryl Injectables: 10 cc of half percent Marcaine plain Complications: None  Indications for surgery: A 65 y.o. male presents with left painful third interspace neuroma. Patient has failed all conservative therapy including but not limited to injection, shoe gear modification, orthotics. He wishes to have surgical correction of the foot/deformity. It was determined that patient would benefit from excision of the left third interspace neuroma. Informed surgical risk consent was reviewed and read aloud to the patient.  I reviewed the films.  I have discussed my findings with the patient in great detail.  I have discussed all risks including but not limited to infection, stiffness, scarring, limp, disability, deformity, damage to blood vessels and nerves, numbness, poor healing, need for braces, arthritis, chronic pain, amputation, death.  All benefits and realistic expectations discussed in great detail.  I have made no promises as to the outcome.  I have provided realistic expectations.  I have offered the patient a 2nd opinion, which they have declined and assured me they preferred to proceed despite the risks   Procedure in detail: The patient was both verbally and visually identified by myself, the nursing staff, and anesthesia staff in the preoperative holding area. They were then transferred to the  operating room and placed on the operative table in supine position.  Attention was directed to the left foot: Skin marker was used to delineate a longitudinal incision in the left third interspace.  The incision was carried down by 15 blade down to level of epidermis dermal layer followed by down to the level of the deep transverse intermetatarsal ligament.  Using Metzenbaums the deep transverse intermetatarsal ligament was transected at this time neuroma was noted.  The neuroma appeared to be very fibrosed and thickened.  The neuroma was excised out in its entirety.  The proximal margin of the nerve was buried within the interosseous muscle belly.  The incision was irrigated with normal sterile saline solution.  Deeper layers were closed with 3-0 Monocryl followed by skin closure with 3-0 Prolene in simple interrupted suture technique.  The incision was dressed with Xeroform Kerlix Ace bandage all bony prominences were padded.  Tourniquet was deflated at total time of 15 minutes.  Cap refill was noted to all digits  At the conclusion of the procedure the patient was awoken from anesthesia and found to have tolerated the procedure well any complications. There were transferred to PACU with vital signs stable and vascular status intact.  Boneta Lucks, DPM

## 2020-08-15 NOTE — Anesthesia Preprocedure Evaluation (Addendum)
Anesthesia Evaluation  Patient identified by MRN, date of birth, ID band Patient awake    Reviewed: Allergy & Precautions, NPO status , Patient's Chart, lab work & pertinent test results, reviewed documented beta blocker date and time   Airway Mallampati: II  TM Distance: >3 FB Neck ROM: Full    Dental  (+) Poor Dentition, Chipped, Dental Advisory Given, Missing, Loose   Pulmonary Current SmokerPatient did not abstain from smoking.,    Pulmonary exam normal breath sounds clear to auscultation       Cardiovascular Exercise Tolerance: Good hypertension, Pt. on medications and Pt. on home beta blockers + CAD, + Cardiac Stents and + Peripheral Vascular Disease  Normal cardiovascular exam Rhythm:Regular Rate:Normal  EKG 08/03/20/ NSR, possible inferior ischemia- inverted T waves in III and aVF  Echo 04/04/13 Left ventricle: Wall thickness was increased in a pattern  of mild LVH. Systolic function was normal. The estimated ejection fraction was in the range of 60% to 65%. Wall motion was normal; there were no regional wall motion abnormalities. There was an increased relative contribution of atrial contraction to ventricular filling. Doppler parameters are consistent with abnormal left ventricular relaxation (grade 1 diastolic dysfunction).  There was no evidence of elevated ventricular filling  pressure by Doppler parameters.  - Mitral valve: Mildly thickened anterior leaflet. No  significant regurgitation is seen.    Stress Test 10/13/14 Inferior ischemia, equivocal test  PCI 03/2013 Stents x 2 LAD, RCA  LVEF 55-65%   Neuro/Psych Anxiety Morton's neuroma left foot Slight memory loss from CVA  Neuromuscular disease CVA, Residual Symptoms    GI/Hepatic hiatal hernia, GERD  Controlled and Medicated,(+) Cirrhosis     substance abuse  alcohol use, Hepatitis -, CQuit drinking 2016   Endo/Other  Hyperlipidemia   Renal/GU negative Renal ROS  negative genitourinary   Musculoskeletal negative musculoskeletal ROS (+)   Abdominal   Peds  Hematology Plavix therapy- last dose 08/12/20   Anesthesia Other Findings   Reproductive/Obstetrics                            Anesthesia Physical Anesthesia Plan  ASA: III  Anesthesia Plan: General   Post-op Pain Management:    Induction: Intravenous  PONV Risk Score and Plan: 2 and Treatment may vary due to age or medical condition, Ondansetron and Dexamethasone  Airway Management Planned: LMA  Additional Equipment:   Intra-op Plan:   Post-operative Plan: Extubation in OR  Informed Consent: I have reviewed the patients History and Physical, chart, labs and discussed the procedure including the risks, benefits and alternatives for the proposed anesthesia with the patient or authorized representative who has indicated his/her understanding and acceptance.     Dental advisory given  Plan Discussed with: CRNA and Anesthesiologist  Anesthesia Plan Comments:         Anesthesia Quick Evaluation

## 2020-08-15 NOTE — Transfer of Care (Signed)
Immediate Anesthesia Transfer of Care Note  Patient: Duane Adams  Procedure(s) Performed: EXCISION MORTON'S NEUROMA THIRD TOE LEFT FOOT (Left )  Patient Location: PACU  Anesthesia Type:General  Level of Consciousness: awake and alert   Airway & Oxygen Therapy: Patient Spontanous Breathing and Patient connected to face mask oxygen  Post-op Assessment: Report given to RN and Post -op Vital signs reviewed and stable  Post vital signs: Reviewed and stable  Last Vitals:  Vitals Value Taken Time  BP 171/95 08/15/20 1300  Temp    Pulse 98 08/15/20 1302  Resp 15 08/15/20 1302  SpO2 99 % 08/15/20 1302  Vitals shown include unvalidated device data.  Last Pain:  Vitals:   08/15/20 1022  TempSrc: Oral  PainSc: 0-No pain         Complications: No complications documented.

## 2020-08-15 NOTE — Discharge Instructions (Signed)
°  Patient has an appointment scheduled for 08/22/2020 at 1:45 PM. After Surgery Instructions   1) If you are recuperating from surgery anywhere other than home, please be sure to leave [place] the number where you can be reached.  2) Go directly home and rest.  3) Keep the operated foot(feet) elevated six inches above the hip when sitting or lying down. This will help control swelling and pain.  4) Support the elevated foot and leg with pillows. DO NOT PLACE PILLOWS UNDER THE KNEE.  5) DO NOT REMOVE or get your bandages WET, unless you were given different instructions by your doctor to do so. This increases the risk of infection.  6) Wear your surgical shoe or surgical boot at all times when you are up on your feet.  7) A limited amount of pain and swelling may occur. The skin may take on a bruised appearance. DO NOT BE ALARMED, THIS IS NORMAL.  8) For slight pain and swelling, apply an ice pack directly over the bandages for 15 minutes only out of each hour of the day. Continue until seen in the office for your first post op visit. DON NOT     APPLY ANY FORM OF HEAT TO THE AREA.  9) Have prescriptions filled immediately and take as directed.  10) Drink lots of liquids, water and juice to stay hydrated.  11) CALL IMMEDIATELY IF:  *Bleeding continues until the following day of surgery  *Pain increases and/or does not respond to medication  *Bandages or cast appears to tight  *If your bandage gets wet  *Trip, fall or stump your surgical foot  *If your temperature goes above 101  *If you have ANY questions at all  [place]. ADHERING TO THESE INSTRUCTIONS WILL OFFER YOU THE MOST COMPLETE RESULTS   Post Anesthesia Home Care Instructions  Activity: Get plenty of rest for the remainder of the day. A responsible individual must stay with you for 24 hours following the procedure.  For the next 24 hours, DO NOT: -Drive a car -Paediatric nurse -Drink  alcoholic beverages -Take any medication unless instructed by your physician -Make any legal decisions or sign important papers.  Meals: Start with liquid foods such as gelatin or soup. Progress to regular foods as tolerated. Avoid greasy, spicy, heavy foods. If nausea and/or vomiting occur, drink only clear liquids until the nausea and/or vomiting subsides. Call your physician if vomiting continues.  Special Instructions/Symptoms: Your throat may feel dry or sore from the anesthesia or the breathing tube placed in your throat during surgery. If this causes discomfort, gargle with warm salt water. The discomfort should disappear within 24 hours.  If you had a scopolamine patch placed behind your ear for the management of post- operative nausea and/or vomiting:  1. The medication in the patch is effective for 72 hours, after which it should be removed.  Wrap patch in a tissue and discard in the trash. Wash hands thoroughly with soap and water. 2. You may remove the patch earlier than 72 hours if you experience unpleasant side effects which may include dry mouth, dizziness or visual disturbances. 3. Avoid touching the patch. Wash your hands with soap and water after contact with the patch.

## 2020-08-15 NOTE — Interval H&P Note (Signed)
History and Physical Interval Note:  08/15/2020 12:02 PM  Duane Adams  has presented today for surgery, with the diagnosis of NEUROMA.  The various methods of treatment have been discussed with the patient and family. After consideration of risks, benefits and other options for treatment, the patient has consented to  Procedure(s): [place] TOE LEFT FOOT (Left) as a surgical intervention.  The patient's history has been reviewed, patient examined, no change in status, stable for surgery.  I have reviewed the patient's chart and labs.  Questions were answered to the patient's satisfaction.     [name]

## 2020-08-15 NOTE — Anesthesia Procedure Notes (Signed)
Procedure Name: LMA Insertion Date/Time: 08/15/2020 12:31 PM Performed by: Lieutenant Diego, CRNA Pre-anesthesia Checklist: Patient identified, Emergency Drugs available, Suction available and Patient being monitored Patient Re-evaluated:Patient Re-evaluated prior to induction Oxygen Delivery Method: Circle system utilized Preoxygenation: Pre-oxygenation with 100% oxygen Induction Type: IV induction Ventilation: Mask ventilation without difficulty LMA: LMA inserted LMA Size: 5.0 Number of attempts: 1 Placement Confirmation: positive ETCO2 and breath sounds checked- equal and bilateral Tube secured with: Tape Dental Injury: Teeth and Oropharynx as per pre-operative assessment

## 2020-08-15 NOTE — H&P (Signed)
Anesthesia H&P Update: History and Physical Exam reviewed; patient is OK for planned anesthetic and procedure. ? ?

## 2020-08-15 NOTE — Anesthesia Postprocedure Evaluation (Signed)
Anesthesia Post Note  Patient: Duane Adams  Procedure(s) Performed: EXCISION MORTON'S NEUROMA THIRD INTERSPACE LEFT FOOT (Left Foot)     Patient location during evaluation: PACU Anesthesia Type: General Level of consciousness: awake and alert and oriented Pain management: pain level controlled Vital Signs Assessment: post-procedure vital signs reviewed and stable Respiratory status: spontaneous breathing, nonlabored ventilation and respiratory function stable Cardiovascular status: blood pressure returned to baseline Postop Assessment: no apparent nausea or vomiting Anesthetic complications: no   No complications documented.  Last Vitals:  Vitals:   08/15/20 1302 08/15/20 1315  BP: (!) 163/84 136/76  Pulse: 95 86  Resp: (!) 21 17  Temp: 36.6 C   SpO2: 98% 95%    Last Pain:  Vitals:   08/15/20 1302  TempSrc:   PainSc: 0-No pain                 Brennan Bailey

## 2020-08-16 ENCOUNTER — Encounter (HOSPITAL_BASED_OUTPATIENT_CLINIC_OR_DEPARTMENT_OTHER): Payer: Self-pay | Admitting: Podiatry

## 2020-08-16 LAB — SURGICAL PATHOLOGY

## 2020-08-17 ENCOUNTER — Telehealth: Payer: Self-pay | Admitting: Podiatry

## 2020-08-17 MED ORDER — HYDROCODONE-ACETAMINOPHEN 10-325 MG PO TABS: 1.0000 | ORAL_TABLET | Freq: Three times a day (TID) | ORAL | 0 refills | Status: AC | PRN

## 2020-08-17 MED ORDER — TRAMADOL HCL 50 MG PO TABS: 50.0000 mg | ORAL_TABLET | Freq: Three times a day (TID) | ORAL | 0 refills | Status: AC | PRN

## 2020-08-17 NOTE — Telephone Encounter (Signed)
done

## 2020-08-17 NOTE — Telephone Encounter (Signed)
Dr. Posey Pronto this patient can't take anything with codeine

## 2020-08-17 NOTE — Telephone Encounter (Signed)
Patient called and stated that the oxycodone that was prescribed is making him itch all over. He would like a different type of medication sent to his pharmacy

## 2020-08-22 ENCOUNTER — Encounter: Payer: Self-pay | Admitting: Podiatry

## 2020-08-22 ENCOUNTER — Ambulatory Visit (INDEPENDENT_AMBULATORY_CARE_PROVIDER_SITE_OTHER): Payer: Medicaid Other | Admitting: Podiatry

## 2020-08-22 ENCOUNTER — Other Ambulatory Visit: Payer: Self-pay

## 2020-08-22 ENCOUNTER — Encounter: Payer: Medicaid Other | Admitting: Podiatry

## 2020-08-22 DIAGNOSIS — Z9889 Other specified postprocedural states: Secondary | ICD-10-CM

## 2020-08-22 DIAGNOSIS — D3610 Benign neoplasm of peripheral nerves and autonomic nervous system, unspecified: Secondary | ICD-10-CM

## 2020-08-22 MED ORDER — DOXYCYCLINE HYCLATE 100 MG PO TABS: 100.0000 mg | ORAL_TABLET | Freq: Two times a day (BID) | ORAL | 0 refills | Status: DC

## 2020-08-22 NOTE — Progress Notes (Signed)
Subjective:  Patient ID: Duane Adams, male    DOB: 1955-11-28,  MRN: [id number]  Chief Complaint  Patient presents with  . Routine Post Op    DOS 2.2.22      Procedure: Left third interspace neuroma excision  65 y.o. male returns for post-op check.  Patient states is doing well.  He denies any other acute complaints.  He had pain for first 2 days but is doing much better and pain is well controlled.  Review of Systems: Negative except as noted in the HPI. Denies N/V/F/Ch.  Past Medical History:  Diagnosis Date  . Allergy   . Anxiety   . 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 Concussion several yrs ago   no residual  . Coronary artery disease    a. 03/2013 Cath/PCI: LM nl, LAD 68m (2.5x20 Promus Premier DES), LCX min irregs, RCA dom, 90p(3.0x20 Promus Premier DES), EF 55-65%.  . CVA (cerebral vascular accident) ([place]) 01/31/2019   slight memory issues  . Essential hypertension, benign   . GERD (gastroesophageal reflux disease)   . Helicobacter pylori gastritis JUN 2016 EGD/Bx   PREVPAK BID FOR 14 DAYS  . Hepatitis    Hepatitis C treated  2011  . Hiatal hernia   . History of kidney stones   . Mixed hyperlipidemia   . Neuroma   . Prediabetes 06/23/2019  . Skin cancer   . 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 Tobacco abuse     Current Outpatient Medications:  .  doxycycline (VIBRA-TABS) 100 MG tablet, Take 1 tablet (100 mg total) by mouth 2 (two) times daily., Disp: 20 tablet, Rfl: 0 .  amLODipine (NORVASC) 5 MG tablet, TAKE ONE TABLET BY MOUTH ONCE DAILY., Disp: 90 tablet, Rfl: 0 .  aspirin EC 81 MG tablet, Take 81 mg by mouth at bedtime. , Disp: , Rfl:  .  atorvastatin (LIPITOR) 40 MG tablet, TAKE ONE TABLET BY MOUTH ONCE DAILY. (Patient taking differently: daily.), Disp: 30 tablet, Rfl: 3 .  chlorthalidone (HYGROTON) 25 MG tablet, TAKE 1 TABLET BY MOUTH DAILY., Disp: 30 tablet, Rfl: 0 .  Cholecalciferol (VITAMIN D3) 2000 units TABS, Take 2 tablets  by mouth daily. 4000 units per day, Disp: , Rfl:  .  cloNIDine (CATAPRES) 0.2 MG tablet, TAKE (1) TABLET BY MOUTH TWICE DAILY., Disp: 180 tablet, Rfl: 3 .  clopidogrel (PLAVIX) 75 MG tablet, TAKE 1 TABLET BY MOUTH ONCE DAILY., Disp: 30 tablet, Rfl: 11 .  HYDROcodone-acetaminophen (NORCO) 10-325 MG tablet, Take 1 tablet by mouth every 8 (eight) hours as needed for up to 5 days., Disp: 15 tablet, Rfl: 0 .  isosorbide mononitrate (IMDUR) 30 MG 24 hr tablet, TAKE 1/2 TABLET BY MOUTH ONCE DAILY., Disp: 15 tablet, Rfl: 2 .  metoprolol succinate (TOPROL-XL) 100 MG 24 hr tablet, TAKE ONE TABLET BY MOUTH ONCE DAILY., Disp: 30 tablet, Rfl: 3 .  Multiple Vitamin (MULTIVITAMIN WITH MINERALS) TABS tablet, Take 1 tablet by mouth daily., Disp: , Rfl:  .  nitroGLYCERIN (NITROSTAT) 0.4 MG SL tablet, Place 1 tablet (0.4 mg total) under the tongue every 5 (five) minutes as needed for chest pain., Disp: 25 tablet, Rfl: 3 .  Omega-3 1000 MG CAPS, Take 1 capsule (1,000 mg total) by mouth in the morning and at bedtime. (Patient taking differently: Take 2 capsules by mouth in the morning and at bedtime.), Disp: 180 capsule, Rfl: 0 .  oxyCODONE-acetaminophen (PERCOCET) 10-325 MG tablet, Take 1 tablet  by mouth every 4 (four) hours as needed for pain., Disp: 30 tablet, Rfl: 0 .  pantoprazole (PROTONIX) 40 MG tablet, TAKE ONE TABLET BY MOUTH ONCE DAILY., Disp: 90 tablet, Rfl: 3 .  potassium chloride SA (KLOR-CON) 20 MEQ tablet, TAKE ONE TABLET BY MOUTH ONCE DAILY., Disp: 90 tablet, Rfl: 3 .  traMADol (ULTRAM) 50 MG tablet, Take 1 tablet (50 mg total) by mouth every 8 (eight) hours as needed for up to 5 days., Disp: 15 tablet, Rfl: 0  Social History   Tobacco Use  Smoking Status Light Tobacco Smoker  . Packs/day: 0.50  . Years: 40.00  . Pack years: 20.00  . Types: Cigarettes  . Start date: 07/14/1968  Smokeless Tobacco Never Used  Tobacco Comment   5-6 Cigarettes per day    Allergies  Allergen Reactions  . Lisinopril  Swelling    Swelling neck  . Chantix [Varenicline] Other (See Comments)    Abdominal pain  . Neosporin [Neomycin-Bacitracin Zn-Polymyx] Rash    blistered   Objective:  There were no vitals filed for this visit. There is no height or weight on file to calculate BMI. Constitutional Well developed. Well nourished.  Vascular Foot warm and well perfused. Capillary refill normal to all digits.   Neurologic Normal speech. Oriented to person, place, and time. Epicritic sensation to light touch grossly present bilaterally.  Dermatologic Skin healing well without signs of infection. Skin edges well coapted without signs of infection.  Orthopedic: Tenderness to palpation noted about the surgical site.   Radiographs: None Assessment:   1. Neuroma   2. Status post foot surgery    Plan:  Patient was evaluated and treated and all questions answered.  S/p foot surgery left -Progressing as expected post-operatively. -XR: None -WB Status: Weightbearing as tolerated in surgical shoe -Sutures: Intact.  No signs of dehiscence noted no clinical signs of infection noted. -Medications: None -Foot redressed.  No follow-ups on file.

## 2020-09-01 ENCOUNTER — Other Ambulatory Visit: Payer: Self-pay | Admitting: Cardiology

## 2020-09-12 ENCOUNTER — Other Ambulatory Visit: Payer: Self-pay

## 2020-09-12 ENCOUNTER — Ambulatory Visit (INDEPENDENT_AMBULATORY_CARE_PROVIDER_SITE_OTHER): Payer: Medicaid Other | Admitting: Podiatry

## 2020-09-12 DIAGNOSIS — D3610 Benign neoplasm of peripheral nerves and autonomic nervous system, unspecified: Secondary | ICD-10-CM

## 2020-09-12 DIAGNOSIS — Z9889 Other specified postprocedural states: Secondary | ICD-10-CM

## 2020-09-14 ENCOUNTER — Encounter: Payer: Self-pay | Admitting: Podiatry

## 2020-09-14 NOTE — Progress Notes (Signed)
Subjective:  Patient ID: Duane Adams, male    DOB: 01-26-1956,  MRN: [id number]  Chief Complaint  Patient presents with  . Routine Post Op    Post op DOS 2.2.22     Procedure: Left third interspace neuroma excision  65 y.o. male returns for post-op check.  Patient states is doing well.  He denies any other acute complaints.  His pain is well controlled.  He is here to have his stitches taken out.  He denies any other acute complaints.  Review of Systems: Negative except as noted in the HPI. Denies N/V/F/Ch.  Past Medical History:  Diagnosis Date  . Allergy   . Anxiety   . 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 Concussion several yrs ago   no residual  . Coronary artery disease    a. 03/2013 Cath/PCI: LM nl, LAD 59m (2.5x20 Promus Premier DES), LCX min irregs, RCA dom, 90p(3.0x20 Promus Premier DES), EF 55-65%.  . CVA (cerebral vascular accident) ([place]) 01/31/2019   slight memory issues  . Essential hypertension, benign   . GERD (gastroesophageal reflux disease)   . Helicobacter pylori gastritis JUN 2016 EGD/Bx   PREVPAK BID FOR 14 DAYS  . Hepatitis    Hepatitis C treated  2011  . Hiatal hernia   . History of kidney stones   . Mixed hyperlipidemia   . Neuroma   . Prediabetes 06/23/2019  . Skin cancer   . Substance abuse ([place])    alcoholic quit 7371  . Tobacco abuse     Current Outpatient Medications:  .  amLODipine (NORVASC) 5 MG tablet, TAKE ONE TABLET BY MOUTH ONCE DAILY., Disp: 90 tablet, Rfl: 3 .  aspirin EC 81 MG tablet, Take 81 mg by mouth at bedtime. , Disp: , Rfl:  .  atorvastatin (LIPITOR) 40 MG tablet, TAKE ONE TABLET BY MOUTH ONCE DAILY. (Patient taking differently: daily.), Disp: 30 tablet, Rfl: 3 .  chlorthalidone (HYGROTON) 25 MG tablet, TAKE 1 TABLET BY MOUTH DAILY., Disp: 30 tablet, Rfl: 3 .  Cholecalciferol (VITAMIN D3) 2000 units TABS, Take 2 tablets by mouth daily. 4000 units per day, Disp: , Rfl:  .  cloNIDine (CATAPRES) 0.2 MG tablet,  TAKE (1) TABLET BY MOUTH TWICE DAILY., Disp: 180 tablet, Rfl: 3 .  clopidogrel (PLAVIX) 75 MG tablet, TAKE 1 TABLET BY MOUTH ONCE DAILY., Disp: 30 tablet, Rfl: 11 .  doxycycline (VIBRA-TABS) 100 MG tablet, Take 1 tablet (100 mg total) by mouth 2 (two) times daily., Disp: 20 tablet, Rfl: 0 .  isosorbide mononitrate (IMDUR) 30 MG 24 hr tablet, TAKE 1/2 TABLET BY MOUTH ONCE DAILY., Disp: 15 tablet, Rfl: 2 .  metoprolol succinate (TOPROL-XL) 100 MG 24 hr tablet, TAKE ONE TABLET BY MOUTH ONCE DAILY., Disp: 30 tablet, Rfl: 3 .  Multiple Vitamin (MULTIVITAMIN WITH MINERALS) TABS tablet, Take 1 tablet by mouth daily., Disp: , Rfl:  .  nitroGLYCERIN (NITROSTAT) 0.4 MG SL tablet, Place 1 tablet (0.4 mg total) under the tongue every 5 (five) minutes as needed for chest pain., Disp: 25 tablet, Rfl: 3 .  Omega-3 1000 MG CAPS, Take 1 capsule (1,000 mg total) by mouth in the morning and at bedtime. (Patient taking differently: Take 2 capsules by mouth in the morning and at bedtime.), Disp: 180 capsule, Rfl: 0 .  oxyCODONE-acetaminophen (PERCOCET) 10-325 MG tablet, Take 1 tablet by mouth every 4 (four) hours as needed for pain., Disp: 30 tablet, Rfl: 0 .  pantoprazole (PROTONIX) 40 MG tablet,  TAKE ONE TABLET BY MOUTH ONCE DAILY., Disp: 90 tablet, Rfl: 3 .  potassium chloride SA (KLOR-CON) 20 MEQ tablet, TAKE ONE TABLET BY MOUTH ONCE DAILY., Disp: 90 tablet, Rfl: 3  Social History   Tobacco Use  Smoking Status Light Tobacco Smoker  . Packs/day: 0.50  . Years: 40.00  . Pack years: 20.00  . Types: Cigarettes  . Start date: 07/14/1968  Smokeless Tobacco Never Used  Tobacco Comment   5-6 Cigarettes per day    Allergies  Allergen Reactions  . Lisinopril Swelling    Swelling neck  . Chantix [Varenicline] Other (See Comments)    Abdominal pain  . Neosporin [Neomycin-Bacitracin Zn-Polymyx] Rash    blistered   Objective:  There were no vitals filed for this visit. There is no height or weight on file to  calculate BMI. Constitutional Well developed. Well nourished.  Vascular Foot warm and well perfused. Capillary refill normal to all digits.   Neurologic Normal speech. Oriented to person, place, and time. Epicritic sensation to light touch grossly present bilaterally.  Dermatologic Skin healing well without signs of infection. Skin edges well coapted without signs of infection.  Orthopedic: Tenderness to palpation noted about the surgical site.   Radiographs: None Assessment:   1. Status post foot surgery   2. Neuroma    Plan:  Patient was evaluated and treated and all questions answered.  S/p foot surgery left -Progressing as expected post-operatively. -XR: None -WB Status: Weightbearing as tolerated in regular shoes -Sutures: Removed no signs of dehiscence noted no clinical signs of infection noted. -Medications: None   No follow-ups on file.

## 2020-09-15 ENCOUNTER — Other Ambulatory Visit (INDEPENDENT_AMBULATORY_CARE_PROVIDER_SITE_OTHER): Payer: Self-pay | Admitting: Nurse Practitioner

## 2020-09-15 DIAGNOSIS — I10 Essential (primary) hypertension: Secondary | ICD-10-CM

## 2020-09-15 DIAGNOSIS — R7303 Prediabetes: Secondary | ICD-10-CM

## 2020-09-15 DIAGNOSIS — I2510 Atherosclerotic heart disease of native coronary artery without angina pectoris: Secondary | ICD-10-CM

## 2020-09-15 DIAGNOSIS — E782 Mixed hyperlipidemia: Secondary | ICD-10-CM

## 2020-09-28 ENCOUNTER — Other Ambulatory Visit (INDEPENDENT_AMBULATORY_CARE_PROVIDER_SITE_OTHER): Payer: Self-pay | Admitting: Internal Medicine

## 2020-10-10 ENCOUNTER — Encounter: Payer: Medicaid Other | Admitting: Podiatry

## 2020-11-14 ENCOUNTER — Ambulatory Visit (INDEPENDENT_AMBULATORY_CARE_PROVIDER_SITE_OTHER): Payer: Medicaid Other | Admitting: Nurse Practitioner

## 2020-11-15 ENCOUNTER — Ambulatory Visit (INDEPENDENT_AMBULATORY_CARE_PROVIDER_SITE_OTHER): Payer: Medicaid Other | Admitting: Nurse Practitioner

## 2020-11-15 ENCOUNTER — Encounter (INDEPENDENT_AMBULATORY_CARE_PROVIDER_SITE_OTHER): Payer: Self-pay | Admitting: Nurse Practitioner

## 2020-11-20 ENCOUNTER — Ambulatory Visit (INDEPENDENT_AMBULATORY_CARE_PROVIDER_SITE_OTHER): Payer: Medicaid Other | Admitting: Internal Medicine

## 2020-12-05 ENCOUNTER — Other Ambulatory Visit: Payer: Self-pay | Admitting: Gastroenterology

## 2020-12-06 NOTE — Telephone Encounter (Signed)
Please arrange office visit for refills. I have refilled X 1.

## 2020-12-14 ENCOUNTER — Other Ambulatory Visit (INDEPENDENT_AMBULATORY_CARE_PROVIDER_SITE_OTHER): Payer: Self-pay | Admitting: Internal Medicine

## 2020-12-17 ENCOUNTER — Ambulatory Visit (INDEPENDENT_AMBULATORY_CARE_PROVIDER_SITE_OTHER): Payer: Medicaid Other | Admitting: Internal Medicine

## 2020-12-17 ENCOUNTER — Encounter (INDEPENDENT_AMBULATORY_CARE_PROVIDER_SITE_OTHER): Payer: Self-pay | Admitting: Internal Medicine

## 2020-12-17 ENCOUNTER — Other Ambulatory Visit: Payer: Self-pay

## 2020-12-17 VITALS — BP 132/78 | HR 85 | Temp 97.9°F | Ht 69.0 in | Wt 188.8 lb

## 2020-12-17 DIAGNOSIS — R5383 Other fatigue: Secondary | ICD-10-CM | POA: Diagnosis not present

## 2020-12-17 DIAGNOSIS — I10 Essential (primary) hypertension: Secondary | ICD-10-CM | POA: Diagnosis not present

## 2020-12-17 DIAGNOSIS — E782 Mixed hyperlipidemia: Secondary | ICD-10-CM | POA: Diagnosis not present

## 2020-12-17 DIAGNOSIS — M545 Low back pain, unspecified: Secondary | ICD-10-CM

## 2020-12-17 DIAGNOSIS — R072 Precordial pain: Secondary | ICD-10-CM

## 2020-12-17 DIAGNOSIS — R7303 Prediabetes: Secondary | ICD-10-CM | POA: Diagnosis not present

## 2020-12-17 DIAGNOSIS — G8929 Other chronic pain: Secondary | ICD-10-CM

## 2020-12-17 DIAGNOSIS — R5381 Other malaise: Secondary | ICD-10-CM

## 2020-12-17 NOTE — Progress Notes (Signed)
Metrics: Intervention Frequency ACO  Documented Smoking Status Yearly  Screened one or more times in 24 months  Cessation Counseling or  Active cessation medication Past 24 months  Past 24 months   Guideline developer: UpToDate (See UpToDate for funding source) Date Released: 2014       Wellness Office Visit  Subjective:  Patient ID: Duane Adams, male    DOB: [birth year]  Age: 65 y.o. MRN: [id number]  CC: This man comes in for follow-up of hypertension, prediabetes, hyperlipidemia.  He has a history of coronary artery disease. HPI  He tells me that for the last 2 months he has been getting precordial chest pain on exertion which is dull in nature and which usually subsides after he stops exerting himself.  This sounds very much cardiac in nature. He also has hip pain which is likely coming from his low back according to orthopedics.  Orthopedics, Dr. Aline Brochure, has recommended referral to a spine specialist. He continues on several antihypertensive medications. Past Medical History:  Diagnosis Date  . Allergy   . Anxiety   . 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 Concussion several yrs ago   no residual  . Coronary artery disease    a. 03/2013 Cath/PCI: LM nl, LAD 53m (2.5x20 Promus Premier DES), LCX min irregs, RCA dom, 90p(3.0x20 Promus Premier DES), EF 55-65%.  . CVA (cerebral vascular accident) ([place]) 01/31/2019   slight memory issues  . Essential hypertension, benign   . GERD (gastroesophageal reflux disease)   . Helicobacter pylori gastritis JUN 2016 EGD/Bx   PREVPAK BID FOR 14 DAYS  . Hepatitis    Hepatitis C treated  2011  . Hiatal hernia   . History of kidney stones   . Mixed hyperlipidemia   . Neuroma   . Prediabetes 06/23/2019  . Skin cancer   . Substance abuse ([place])    alcoholic quit 2831  . Tobacco abuse    Past Surgical History:  Procedure Laterality Date  . ABDOMINAL AORTOGRAM W/LOWER EXTREMITY N/A 06/15/2017   Procedure: ABDOMINAL AORTOGRAM  W/LOWER EXTREMITY;  Surgeon: Angelia Mould, MD;  Location: [facility];  Service: Cardiovascular;  Laterality: N/A;  Bilateral  . BIOPSY N/A 12/19/2014   Procedure: BIOPSY;  Surgeon: Danie Binder, MD;  Location: [facility];  Service: Endoscopy;  Laterality: N/A;  . COLONOSCOPY  June 2016   [facility]: with endoscopic mucosal resection. 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 Needs colonoscopy in 1 year.   . COLONOSCOPY WITH PROPOFOL N/A 12/19/2014   Dr. Oneida Alar: six simple adenomas and 3 hyperplastic polyps. Had flat mid-transverse colon polyp and referred to [facility] for endoscopic mucosal resection, which is scheduled for July 22  . CORONARY ANGIOPLASTY WITH STENT PLACEMENT  04/11/2013   LAD &  RCA     DR COOPER  . ESOPHAGOGASTRODUODENOSCOPY (EGD) WITH PROPOFOL N/A 12/19/2014   Dr. Oneida Alar:  without varices, small hiatal hernia noted, moderate non-erosive gastritis and mild duodenitis.  POSITIVE H.PYLORI. Prescribed Prevpac.  Marland Kitchen EXCISION MORTON'S NEUROMA Left 08/15/2020   Procedure: EXCISION MORTON'S NEUROMA THIRD INTERSPACE LEFT FOOT;  Surgeon: [name], DPM;  Location: [facility];  Service: Podiatry;  Laterality: Left;  . EXTRACORPOREAL SHOCK WAVE LITHOTRIPSY  2001  . FLEXIBLE SIGMOIDOSCOPY N/A 12/23/2017   Procedure: FLEXIBLE SIGMOIDOSCOPY;  Surgeon: Danie Binder, MD;  Location: [facility];  Service: Endoscopy;  Laterality: N/A;  1:45pm  . HAND RECONSTRUCTION Left   . HEMORRHOID  BANDING N/A 12/23/2017   Procedure: Thayer Jew;  Surgeon: Danie Binder, MD;  [facility];  Service: Endoscopy;  Laterality: N/A;  . LEFT HEART CATHETERIZATION WITH CORONARY ANGIOGRAM N/A 04/11/2013   Procedure: LEFT HEART CATHETERIZATION WITH CORONARY ANGIOGRAM;  Surgeon: Blane Ohara, MD;  Location[facility];  Service: Cardiovascular;  Laterality: N/A;  . LEG TENDON SURGERY Right   . POLYPECTOMY N/A 12/19/2014   Procedure: POLYPECTOMY;  Surgeon:  Danie Binder, MD;  Location: [facility];  Service: Endoscopy;  Laterality: N/A;     Family History  Problem Relation Age of Onset  . Stroke Mother   . Aneurysm Mother   . Hypertension Mother   . Heart disease Father   . Hypertension Father   . Heart disease Brother   . Hypertension Brother   . Alcohol abuse Brother   . Diabetes Paternal Aunt   . 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 Smoking status: Light Tobacco Smoker    Packs/day: 0.50    Years: 40.00    Pack years: 20.00    Types: Cigarettes    Start date: 07/14/1968  . Smokeless tobacco: Never Used  . Tobacco comment: 5-6 Cigarettes per day  Substance Use Topics  . Alcohol use: Yes    Alcohol/week: 0.0 standard drinks    Comment: As of 11/26/17: social - 1-2 beers a sitting x 1-2 times a month    Current Meds  Medication Sig  . amLODipine (NORVASC) 5 MG tablet TAKE ONE TABLET BY MOUTH ONCE DAILY.  Marland Kitchen aspirin EC 81 MG tablet Take 81 mg by mouth at bedtime.   . chlorthalidone (HYGROTON) 25 MG tablet TAKE 1 TABLET BY MOUTH DAILY.  Marland Kitchen Cholecalciferol (VITAMIN D3) 2000 units TABS Take 2 tablets by mouth daily. 4000 units per day  . cloNIDine (CATAPRES) 0.2 MG tablet TAKE (1) TABLET BY MOUTH TWICE DAILY.  Marland Kitchen clopidogrel (PLAVIX) 75 MG tablet TAKE 1 TABLET BY MOUTH ONCE DAILY.  . isosorbide mononitrate (IMDUR) 30 MG 24 hr tablet TAKE 1/2 TABLET BY MOUTH ONCE DAILY.  . metoprolol succinate (TOPROL-XL) 100 MG 24 hr tablet TAKE ONE TABLET BY MOUTH ONCE DAILY.  . Multiple Vitamin (MULTIVITAMIN WITH MINERALS) TABS tablet Take 1 tablet by mouth daily.  . nitroGLYCERIN (NITROSTAT) 0.4 MG SL tablet Place 1 tablet (0.4 mg total) under the tongue every 5 (five) minutes as needed for chest pain.  . Omega-3 1000 MG CAPS Take 1 capsule (1,000 mg total) by mouth  in the morning and at bedtime. (Patient taking differently: Take 2 capsules by mouth in the morning and at bedtime.)  . pantoprazole (PROTONIX) 40 MG tablet TAKE ONE TABLET BY MOUTH ONCE DAILY.  Marland Kitchen potassium chloride SA (KLOR-CON) 20 MEQ tablet TAKE ONE TABLET BY MOUTH ONCE DAILY.     [place] Office Visit from 12/17/2020 in [facility]  PHQ-9 Total Score 9      Objective:   Today's Vitals: BP 132/78   Pulse 85   Temp 97.9 F (36.6 C) (Temporal)   Ht 5\' 9"  (1.753 m)   Wt 188 lb 12.8 oz (85.6 kg)   SpO2 99%   BMI 27.88 kg/m  Vitals with BMI 12/17/2020 08/15/2020 08/15/2020  Height 5'  9" - -  Weight 188 lbs 13 oz - -  BMI 44.45 - -  Systolic 848 350 757  Diastolic 78 83 76  Pulse 85 85 86     Physical Exam   His blood pressure is reasonable.  He is overweight.  No other new physical findings.   Assessment   1. Essential hypertension   2. Prediabetes   3. Mixed hyperlipidemia   4. Malaise and fatigue   5. Precordial pain   6. Chronic bilateral low back pain without sciatica       Tests ordered Orders Placed This Encounter  Procedures  . MR Lumbar Spine Wo Contrast  . Lipid panel  . Hemoglobin A1c  . COMPLETE METABOLIC PANEL WITH GFR  . Ambulatory referral to Cardiology     Plan: 1. Continue with antihypertensive medications. 2. We will make an urgent referral to see cardiologist for review.  I think he probably does need cardiac evaluation again, sooner rather than later. 3. I will also try and get him an MRI lumbar spine for further evaluation and then he may need neurosurgical referral. 4. Further recommendations will depend also on the blood work that I have ordered today. 5. Follow-up with Judson Roch in about 3 months   No orders of the defined types were placed in this encounter.   Doree Albee, MD

## 2020-12-18 ENCOUNTER — Other Ambulatory Visit (INDEPENDENT_AMBULATORY_CARE_PROVIDER_SITE_OTHER): Payer: Self-pay | Admitting: Internal Medicine

## 2020-12-18 LAB — HEMOGLOBIN A1C
Hgb A1c MFr Bld: 6.1 % of total Hgb — ABNORMAL HIGH (ref <5.7)
Mean Plasma Glucose: 128 mg/dL
eAG (mmol/L): 7.1 mmol/L

## 2020-12-18 LAB — COMPLETE METABOLIC PANEL WITH GFR
AG Ratio: 1.7 (calc) (ref 1.0–2.5)
ALT: 42 U/L (ref 9–46)
AST: 29 U/L (ref 10–35)
Albumin: 4.6 g/dL (ref 3.6–5.1)
Alkaline phosphatase (APISO): 52 U/L (ref 35–144)
BUN: 13 mg/dL (ref 7–25)
CO2: 27 mmol/L (ref 20–32)
Calcium: 10.3 mg/dL (ref 8.6–10.3)
Chloride: 101 mmol/L (ref 98–110)
Creat: 0.79 mg/dL (ref 0.70–1.25)
GFR, Est African American: 111 mL/min/{1.73_m2} (ref >=60)
GFR, Est Non African American: 96 mL/min/{1.73_m2} (ref >=60)
Globulin: 2.7 g/dL (calc) (ref 1.9–3.7)
Glucose, Bld: 101 mg/dL (ref 65–139)
Potassium: 3.8 mmol/L (ref 3.5–5.3)
Sodium: 138 mmol/L (ref 135–146)
Total Bilirubin: 0.4 mg/dL (ref 0.2–1.2)
Total Protein: 7.3 g/dL (ref 6.1–8.1)

## 2020-12-18 LAB — LIPID PANEL
Cholesterol: 225 mg/dL — ABNORMAL HIGH (ref <200)
HDL: 46 mg/dL (ref >=40)
Non-HDL Cholesterol (Calc): 179 mg/dL (calc) — ABNORMAL HIGH (ref <130)
Total CHOL/HDL Ratio: 4.9 (calc) (ref <5.0)
Triglycerides: 931 mg/dL — ABNORMAL HIGH (ref <150)

## 2020-12-21 NOTE — Telephone Encounter (Signed)
OV MADE °

## 2020-12-28 ENCOUNTER — Other Ambulatory Visit (INDEPENDENT_AMBULATORY_CARE_PROVIDER_SITE_OTHER): Payer: Self-pay | Admitting: Nurse Practitioner

## 2020-12-28 DIAGNOSIS — I10 Essential (primary) hypertension: Secondary | ICD-10-CM

## 2020-12-28 DIAGNOSIS — E782 Mixed hyperlipidemia: Secondary | ICD-10-CM

## 2020-12-28 DIAGNOSIS — R7303 Prediabetes: Secondary | ICD-10-CM

## 2020-12-28 DIAGNOSIS — I2510 Atherosclerotic heart disease of native coronary artery without angina pectoris: Secondary | ICD-10-CM

## 2020-12-31 ENCOUNTER — Ambulatory Visit (HOSPITAL_COMMUNITY): Payer: Medicaid Other

## 2021-01-03 ENCOUNTER — Telehealth (INDEPENDENT_AMBULATORY_CARE_PROVIDER_SITE_OTHER): Payer: Self-pay

## 2021-01-03 ENCOUNTER — Other Ambulatory Visit (INDEPENDENT_AMBULATORY_CARE_PROVIDER_SITE_OTHER): Payer: Self-pay | Admitting: Internal Medicine

## 2021-01-03 DIAGNOSIS — G8929 Other chronic pain: Secondary | ICD-10-CM

## 2021-01-03 NOTE — Telephone Encounter (Signed)
Okay, I put the order in, I hope it is the correct order?.  Thanks.

## 2021-01-03 NOTE — Progress Notes (Signed)
Ref physical therapy

## 2021-01-03 NOTE — Telephone Encounter (Signed)
Okay, let the patient know and see if he is willing to go to physical therapy.

## 2021-01-03 NOTE — Telephone Encounter (Signed)
Pt said he is will to go I had called previous to this message to get a order. Thank you

## 2021-01-10 ENCOUNTER — Other Ambulatory Visit (INDEPENDENT_AMBULATORY_CARE_PROVIDER_SITE_OTHER): Payer: Self-pay | Admitting: Nurse Practitioner

## 2021-01-10 ENCOUNTER — Other Ambulatory Visit: Payer: Self-pay | Admitting: Cardiology

## 2021-01-15 ENCOUNTER — Other Ambulatory Visit: Payer: Self-pay | Admitting: Cardiology

## 2021-01-15 ENCOUNTER — Other Ambulatory Visit (INDEPENDENT_AMBULATORY_CARE_PROVIDER_SITE_OTHER): Payer: Self-pay | Admitting: Internal Medicine

## 2021-01-15 DIAGNOSIS — E782 Mixed hyperlipidemia: Secondary | ICD-10-CM

## 2021-01-18 ENCOUNTER — Ambulatory Visit: Payer: Medicaid Other | Admitting: Student

## 2021-01-18 ENCOUNTER — Encounter: Payer: Self-pay | Admitting: Student

## 2021-01-18 ENCOUNTER — Other Ambulatory Visit: Payer: Self-pay

## 2021-01-18 VITALS — BP 156/78 | HR 88 | Ht 69.0 in | Wt 198.0 lb

## 2021-01-18 DIAGNOSIS — I25110 Atherosclerotic heart disease of native coronary artery with unstable angina pectoris: Secondary | ICD-10-CM | POA: Diagnosis not present

## 2021-01-18 DIAGNOSIS — I10 Essential (primary) hypertension: Secondary | ICD-10-CM

## 2021-01-18 DIAGNOSIS — I200 Unstable angina: Secondary | ICD-10-CM

## 2021-01-18 DIAGNOSIS — I739 Peripheral vascular disease, unspecified: Secondary | ICD-10-CM

## 2021-01-18 DIAGNOSIS — Z720 Tobacco use: Secondary | ICD-10-CM

## 2021-01-18 DIAGNOSIS — E785 Hyperlipidemia, unspecified: Secondary | ICD-10-CM | POA: Diagnosis not present

## 2021-01-18 DIAGNOSIS — R079 Chest pain, unspecified: Secondary | ICD-10-CM

## 2021-01-18 MED ORDER — ATORVASTATIN CALCIUM 80 MG PO TABS: 80.0000 mg | ORAL_TABLET | Freq: Every day | ORAL | 3 refills | Status: DC

## 2021-01-18 MED ORDER — NITROGLYCERIN 0.4 MG SL SUBL: 0.4000 mg | SUBLINGUAL_TABLET | SUBLINGUAL | 3 refills | Status: AC | PRN

## 2021-01-18 NOTE — H&P (View-Only) (Signed)
Cardiology Office Note    Date:  01/19/2021   ID:  [name], DOB [birth year], MRN [id number]  PCP:  Duane Albee, MD  Cardiologist: Duane Dolly, MD    Chief Complaint  Patient presents with   Follow-up    Exertional chest pain for 2-3 months    History of Present Illness:    Duane Adams is a 65 y.o. male with past medical history of CAD (s/p DES to LAD and DES to RCA in 03/2013, patent stents by repeat cath in 08/2016), HTN, HLD, PAD (with known left subclavian stenosis) and tobacco use who presents to the office today for evaluation of chest pain.   He was last examined myself in 07/2020 and denied any recent chest pain or dyspnea on exertion when doing chores around the house. Had recently built a large shed without any issues. EKG at that time did show TWI along his inferior leads which was more pronounced when compared to prior imaging but given no recent symptoms, he was cleared to proceed with upcoming neurectomy of his left foot with recommendations to repeat a stress test if he developed any new symptoms.  In talking with the patient today, he reports progressive dyspnea on exertion and fatigue for the past 2-3 months. Over the past 1-2 months, he has also developed exertional chest pressure which occurs with activity and lasts for approximately 10 minutes. He has recently noticed this mostly when push mowing and has to stop and rest and then symptoms resolve. He was concerned as this was identical to his prior angina. Also reports occasional episodes of chest pain awakening him from sleep. He has utilized SL NTG but his bottle expired in 2018.  Says he still smokes between 0.5 - 1.0 ppd.    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 03/2013 Cath/PCI: LM nl, LAD 83m (2.5x20 Promus Premier DES), LCX min irregs, RCA dom,  90p(3.0x20 Promus Premier DES), EF 55-65%.   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 Needs colonoscopy in 1 year.    COLONOSCOPY WITH PROPOFOL N/A 12/19/2014   Dr. Oneida Alar: six simple adenomas and 3 hyperplastic polyps. Had flat mid-transverse colon polyp and referred to [facility] for endoscopic mucosal resection, which  is scheduled for July 22   CORONARY ANGIOPLASTY WITH STENT PLACEMENT  04/11/2013   LAD &  RCA     DR COOPER   ESOPHAGOGASTRODUODENOSCOPY (EGD) WITH PROPOFOL N/A 12/19/2014   Dr. Oneida Alar:  without varices, small hiatal hernia noted, moderate non-erosive gastritis and mild duodenitis.  POSITIVE H.PYLORI. Prescribed Prevpac.   EXCISION MORTON'S NEUROMA Left 08/15/2020   Procedure: EXCISION MORTON'S NEUROMA THIRD INTERSPACE LEFT FOOT;  Surgeon: [name], DPM;  Location: [place];  Service: Podiatry;  Laterality: Left;   EXTRACORPOREAL SHOCK WAVE LITHOTRIPSY  2001   FLEXIBLE SIGMOIDOSCOPY N/A 12/23/2017   Procedure: FLEXIBLE  SIGMOIDOSCOPY;  Surgeon: Danie Binder, MD;  Location: [facility];  Service: Endoscopy;  Laterality: N/A;  1:45pm   HAND RECONSTRUCTION Left    HEMORRHOID BANDING N/A 12/23/2017   Procedure: HEMORRHOID BANDING;  Surgeon: Danie Binder, MD;  [facility];  Service: Endoscopy;  Laterality: N/A;   LEFT HEART CATHETERIZATION WITH CORONARY ANGIOGRAM N/A 04/11/2013   Procedure: LEFT HEART CATHETERIZATION WITH CORONARY ANGIOGRAM;  Surgeon: Blane Ohara, MD;  [facility];  Service: Cardiovascular;  Laterality: N/A;   LEG TENDON SURGERY Right    POLYPECTOMY N/A 12/19/2014   Procedure: POLYPECTOMY;  Surgeon: Danie Binder, MD;  Location: [facility];  Service: Endoscopy;  Laterality: N/A;    Current Medications: Outpatient Medications Prior to Visit  Medication Sig Dispense Refill   amLODipine (NORVASC) 5 MG tablet TAKE ONE TABLET BY MOUTH ONCE DAILY. 90 tablet 3   aspirin EC 81 MG tablet Take 81 mg by mouth at bedtime.      chlorthalidone (HYGROTON) 25 MG tablet TAKE 1 TABLET BY MOUTH DAILY. 30 tablet 6   Cholecalciferol (VITAMIN D3) 2000 units TABS Take 2 tablets by mouth daily. 4000 units per day     cloNIDine (CATAPRES) 0.2 MG tablet TAKE (1) TABLET BY MOUTH TWICE DAILY. 180 tablet 3   clopidogrel (PLAVIX) 75 MG tablet TAKE 1 TABLET BY MOUTH ONCE DAILY. 30 tablet 0   isosorbide mononitrate (IMDUR) 30 MG 24 hr tablet TAKE 1/2 TABLET BY MOUTH ONCE DAILY. 15 tablet 3   metoprolol succinate (TOPROL-XL) 100 MG 24 hr tablet TAKE ONE TABLET BY MOUTH ONCE DAILY. 30 tablet 0   Multiple Vitamin (MULTIVITAMIN WITH MINERALS) TABS tablet Take 1 tablet by mouth daily.     Omega-3 1000 MG CAPS Take 1 capsule (1,000 mg total) by mouth in the morning and at bedtime. (Patient taking differently: Take 2 capsules by mouth in the morning and at bedtime.) 180 capsule 0   pantoprazole (PROTONIX) 40 MG tablet TAKE ONE TABLET BY MOUTH ONCE DAILY. 90 tablet 0   potassium chloride SA (KLOR-CON) 20  MEQ tablet TAKE ONE TABLET BY MOUTH ONCE DAILY. 90 tablet 3   atorvastatin (LIPITOR) 40 MG tablet TAKE ONE TABLET BY MOUTH ONCE DAILY. 90 tablet 0   nitroGLYCERIN (NITROSTAT) 0.4 MG SL tablet Place 1 tablet (0.4 mg total) under the tongue every 5 (five) minutes as needed for chest pain. 25 tablet 3   doxycycline (VIBRA-TABS) 100 MG tablet Take 1 tablet (100 mg total) by mouth 2 (two) times daily. (Patient not taking: No sig reported) 20 tablet 0   oxyCODONE-acetaminophen (PERCOCET) 10-325 MG tablet Take 1 tablet by mouth every 4 (four) hours as needed for pain. (Patient not taking: No sig reported) 30 tablet 0   No facility-administered medications prior to visit.     Allergies:  Lisinopril, Crestor [rosuvastatin], Chantix [varenicline], and Neosporin [neomycin-bacitracin zn-polymyx]   Social History   Socioeconomic History   Marital status: Soil scientist    Spouse name: Tisha   Number of children: 2   Years of education: 14   Highest education level: Not on file  Occupational History   Occupation: disabled    Comment: heart  Tobacco Use   Smoking status: Light Smoker    Packs/day: 0.50    Years: 40.00    Pack years: 20.00    Types: Cigarettes    Start date: 07/14/1968   Smokeless tobacco: Never   Tobacco comments:    5-6 Cigarettes per day  Vaping Use   Vaping Use: Never used  Substance and Sexual Activity   Alcohol use: Yes    Alcohol/week: 0.0 standard drinks    Comment: As of 11/26/17: social - 1-2 beers a sitting x 1-2 times a month   Drug use: Not Currently    Types: Cocaine, Marijuana    Comment: cocaine marijuana and acid last used 1984   Sexual activity: Not Currently  Other Topics Concern   Not on file  Social History Narrative   Disabled from heart disease   Previously worked in Scientist, research (medical) with Neurosurgeon for 28 years   Leisure: Teaching laboratory technician   Social Determinants of Radio broadcast assistant Strain: Not on file  Food Insecurity: Not on file   Transportation Needs: Not on file  Physical Activity: Not on file  Stress: Not on file  Social Connections: Not on file     Family History:  The patient's family history includes Alcohol abuse in his brother; Aneurysm in his mother; Diabetes in his paternal aunt; Heart disease in his brother and father; Hypertension in his brother, father, and mother; Stroke in his mother.   Review of Systems:    Please see the history of present illness.     All other systems reviewed and are otherwise negative except as noted above.   Physical Exam:    VS:  BP (!) 156/78   Pulse 88   Ht 5\' 9"  (1.753 m)   Wt 198 lb (89.8 kg)   SpO2 98%   BMI 29.24 kg/m    General: Well developed, well nourished,male appearing in no acute distress. Head: Normocephalic, atraumatic. Neck: No carotid bruits. JVD not elevated.  Lungs: Respirations regular and unlabored, without wheezes or rales.  Heart: Regular rate and rhythm. No S3 or S4.  No murmur, no rubs, or gallops appreciated. Abdomen: Appears non-distended. No obvious abdominal masses. Msk:  Strength and tone appear normal for age. No obvious joint deformities or effusions. Extremities: No clubbing or cyanosis. No pitting edema.  Distal pedal pulses are 2+ bilaterally. Neuro: Alert and oriented X 3. Moves all extremities spontaneously. No focal deficits noted. Psych:  Responds to questions appropriately with a normal affect. Skin: No rashes or lesions noted  Wt Readings from Last 3 Encounters:  01/18/21 198 lb (89.8 kg)  12/17/20 188 lb 12.8 oz (85.6 kg)  08/15/20 189 lb 9.5 oz (86 kg)     Studies/Labs Reviewed:   EKG:  EKG is ordered today.  The ekg ordered today demonstrates NSR, HR 91 with slight ST depression along the inferior leads which has actually improved when compared to prior tracings.   Recent Labs: 04/09/2020: Hemoglobin 15.1; Platelets 220 12/17/2020: ALT 42; BUN 13; Creat 0.79; Potassium 3.8; Sodium 138   Lipid Panel     Component Value Date/Time  CHOL 225 (H) 12/17/2020 1458   TRIG 931 (H) 12/17/2020 1458   HDL 46 12/17/2020 1458   CHOLHDL 4.9 12/17/2020 1458   VLDL UNABLE TO CALCULATE IF TRIGLYCERIDE OVER 400 mg/dL 02/01/2019 0425   LDLCALC  12/17/2020 1458     Comment:     . LDL cholesterol not calculated. Triglyceride levels greater than 400 mg/dL invalidate calculated LDL results. . Reference range: <100 . Desirable range <100 mg/dL for primary prevention;   <70 mg/dL for patients with CHD or diabetic patients  with > or = 2 CHD risk factors. Marland Kitchen LDL-C is now calculated using the Martin-Hopkins  calculation, which is a validated novel method providing  better accuracy than the Friedewald equation in the  estimation of LDL-C.  Cresenciano Genre et al. Annamaria Helling. 8527;782(42): 2061-2068  (http://education.Quest[employer].com/faq/FAQ164)    LDLDIRECT 100.2 (H) 02/01/2019 0425    Additional studies/ records that were reviewed today include:   Cardiac Catheterization: 09/2016 Coronary Angiography  Dominance: Right   Left Main: The left main (LMCA) is a large-caliber vessel that originates  from the left coronary sinus. It bifurcates into the left anterior  descending (LAD) and left circumflex (LCx) arteries. There is no  angiographic evidence of significant disease in the LMCA.   LAD: The LAD is a large-caliber vessel that gives off three diagonal (D)  branches before it wraps around the apex. D1 is a medium caliber vessel  with mild luminal irregularities. D2 and D3 are small caliber vessels.  There is mild in-stent restenosis in the previously placed LAD stent up to  30%.   Left Circumflex: The LCx is a medium-caliber vessel that gives off two  obtuse marginal (OM) branches and then continues as a small vessel in the  AV groove. OM1 is a very small vessel. OM2 is a small-caliber vessel.  There is no angiographic evidence of significant disease in the LCx.   Right Coronary:  The right coronary  artery (RCA) is a large-caliber vessel  originating from the right coronary sinus. It bifurcates distally into the  posterior descending artery (PDA) and a posterolateral branch (PL)  consistent with a right dominant system. There are mild luminal  irregularities in the distal RCA and rPDA. The previously placed stent in  the RCA is widely patent.   Assessment:    1. Coronary artery disease involving native coronary artery of native heart with unstable angina pectoris ([place])   2. Unstable angina ([place])   3. Essential hypertension   4. Hyperlipidemia LDL goal <70   5. PAD (peripheral artery disease) ([place])   6. Tobacco use      Plan:   In order of problems listed above:  1. CAD/Chest Pain concerning for Unstable Angina - He is s/p DES to LAD and DES to RCA in 03/2013 with patent stents by repeat cath in 08/2016 and mild ISR.  - He reports progressive dyspnea on exertion, fatigue and chest pain as outlined above which resembles his prior angina. Given his high pre-test probability due to his symptoms along with uncontrolled HLD and continued tobacco use, a cardiac catheterization for definitive evaluation would be the next step. Reviewed with Dr. Harrington Challenger (DOD) who was in agreement. The procedure along with risks and benefits were reviewed with the patient and he is in agreement to proceed.  - Continue ASA, Plavix, Toprol-XL, Imdur and statin (with dose adjustment as outlined below). Updated Rx for SL NTG provided.   2. HTN - BP is elevated today but has been  well-controlled at recent office visits. Will continue to follow. Continue current medication regimen for now with Amlodipine 5mg  daily, Chlorthalidone 25mg  daily, Clonidine 0.2mg  BID, Imdur 15mg  daily and Toprol-XL 100mg  daily. If BP remains above goal, could further titrate Amlodipine. Previously had neck swelling on ACE-I, therefore ARB has been avoided as well.   3. HLD - Recent FLP in 12/2020 showed total cholesterol of 225, HDL 46,  triglycerides 931 and non-HDL 179. Will titrate Atorvastatin from 40mg  daily to 80mg  daily. Recheck FLP and LFT's in 2-3 months. If still not at goal, would plan for referral to the [facility] for consideration of PCSK-9 inhibitor therapy. We also reviewed the importance of dietary changes as he currently consumes biscuits, gravy and fried foods regularly.   4. PAD - He does have left subclavian stenosis and known infrainguinal arterial disease. Followed by Dr. Scot Dock. He remains on ASA 81mg  daily, Plavix 75mg  daily and statin therapy.   5. Tobacco Use - He continues to smoke 0.5 - 1.0 ppd. Cessation advised.   Shared Decision Making/Informed Consent:   Shared Decision Making/Informed Consent The risks [stroke (1 in 1000), death (1 in 1000), kidney failure [usually temporary] (1 in 500), bleeding (1 in 200), allergic reaction [possibly serious] (1 in 200)], benefits (diagnostic support and management of coronary artery disease) and alternatives of a cardiac catheterization were discussed in detail with Mr. Winther and he is willing to proceed.    Medication Adjustments/Labs and Tests Ordered: Current medicines are reviewed at length with the patient today.  Concerns regarding medicines are outlined above.  Medication changes, Labs and Tests ordered today are listed in the Patient Instructions below. Patient Instructions  Medication Instructions:   INCREASE Atorvastatin to 80 mg daily with dinner   I refilled your nitroglycerin   *If you need a refill on your cardiac medications before your next appointment, please call your pharmacy*   Lab Work: CBC, BMET  Monday 01/21/21 at [facility] outpatient lab   If you have labs (blood work) drawn today and your tests are completely normal, you will receive your results only by: [place] (if you have MyChart) OR A paper copy in the mail If you have any lab test that is abnormal or we need to change your treatment, we will call you  to review the results.   Testing/Procedures: Your physician has requested that you have a cardiac catheterization. Cardiac catheterization is used to diagnose and/or treat various heart conditions. Doctors may recommend this procedure for a number of different reasons. The most common reason is to evaluate chest pain. Chest pain can be a symptom of coronary artery disease (CAD), and cardiac catheterization can show whether plaque is narrowing or blocking your heart's arteries. This procedure is also used to evaluate the valves, as well as measure the blood flow and oxygen levels in different parts of your heart. For further information please visit [email or web address]. Please follow instruction sheet, as given.    Follow-Up: At [facility], you and your health needs are our priority.  As part of our continuing mission to provide you with exceptional heart care, we have created designated Provider Care Teams.  These Care Teams include your primary Cardiologist (physician) and Advanced Practice Providers (APPs -  Physician Assistants and Nurse Practitioners) who all work together to provide you with the care you need, when you need it.  We recommend signing up for the patient portal called "MyChart".  Sign up information is provided on this After Visit  Summary.  MyChart is used to connect with patients for Virtual Visits (Telemedicine).  Patients are able to view lab/test results, encounter notes, upcoming appointments, etc.  Non-urgent messages can be sent to your provider as well.   To learn more about what you can do with MyChart, go to [email or web address].    Your next appointment:   1 month(s)  The format for your next appointment:   In Person  Provider:   You may see Duane Dolly, MD or one of the following Advanced Practice Providers on your designated Care Team:   Bernerd Pho, PA-C  Ermalinda Barrios, PA-C    Other Instructions None    Signed, Erma Heritage, Hershal Coria   01/19/2021 9:44 AM    [facility] [address] [place], [place] [address] Phone: [phone] Fax: [phone]

## 2021-01-18 NOTE — Patient Instructions (Signed)
Medication Instructions:   INCREASE Atorvastatin to 80 mg daily with dinner   I refilled your nitroglycerin   *If you need a refill on your cardiac medications before your next appointment, please call your pharmacy*   Lab Work: CBC, BMET  Monday 01/21/21 at [facility] outpatient lab   If you have labs (blood work) drawn today and your tests are completely normal, you will receive your results only by: [place] (if you have MyChart) OR A paper copy in the mail If you have any lab test that is abnormal or we need to change your treatment, we will call you to review the results.   Testing/Procedures: Your physician has requested that you have a cardiac catheterization. Cardiac catheterization is used to diagnose and/or treat various heart conditions. Doctors may recommend this procedure for a number of different reasons. The most common reason is to evaluate chest pain. Chest pain can be a symptom of coronary artery disease (CAD), and cardiac catheterization can show whether plaque is narrowing or blocking your heart's arteries. This procedure is also used to evaluate the valves, as well as measure the blood flow and oxygen levels in different parts of your heart. For further information please visit [email or web address]. Please follow instruction sheet, as given.    Follow-Up: At [facility], you and your health needs are our priority.  As part of our continuing mission to provide you with exceptional heart care, we have created designated Provider Care Teams.  These Care Teams include your primary Cardiologist (physician) and Advanced Practice Providers (APPs -  Physician Assistants and Nurse Practitioners) who all work together to provide you with the care you need, when you need it.  We recommend signing up for the patient portal called "MyChart".  Sign up information is provided on this After Visit Summary.  MyChart is used to connect with patients for Virtual Visits  (Telemedicine).  Patients are able to view lab/test results, encounter notes, upcoming appointments, etc.  Non-urgent messages can be sent to your provider as well.   To learn more about what you can do with MyChart, go to [email or web address].    Your next appointment:   1 month(s)  The format for your next appointment:   In Person  Provider:   You may see Carlyle Dolly, MD or one of the following Advanced Practice Providers on your designated Care Team:   Bernerd Pho, PA-C  Ermalinda Barrios, [place]    Other Instructions None

## 2021-01-18 NOTE — Progress Notes (Signed)
Cardiology Office Note    Date:  01/19/2021   ID:  Duane Adams, DOB Aug 28, 1955, MRN [id number]  PCP:  Duane Albee, MD  Cardiologist: Carlyle Dolly, MD    Chief Complaint  Patient presents with   Follow-up    Exertional chest pain for 2-3 months    History of Present Illness:    Duane Adams is a 65 y.o. male with past medical history of CAD (s/p DES to LAD and DES to RCA in 03/2013, patent stents by repeat cath in 08/2016), HTN, HLD, PAD (with known left subclavian stenosis) and tobacco use who presents to the office today for evaluation of chest pain.   He was last examined myself in 07/2020 and denied any recent chest pain or dyspnea on exertion when doing chores around the house. Had recently built a large shed without any issues. EKG at that time did show TWI along his inferior leads which was more pronounced when compared to prior imaging but given no recent symptoms, he was cleared to proceed with upcoming neurectomy of his left foot with recommendations to repeat a stress test if he developed any new symptoms.  In talking with the patient today, he reports progressive dyspnea on exertion and fatigue for the past 2-3 months. Over the past 1-2 months, he has also developed exertional chest pressure which occurs with activity and lasts for approximately 10 minutes. He has recently noticed this mostly when push mowing and has to stop and rest and then symptoms resolve. He was concerned as this was identical to his prior angina. Also reports occasional episodes of chest pain awakening him from sleep. He has utilized SL NTG but his bottle expired in 2018.  Says he still smokes between 0.5 - 1.0 ppd.    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 03/2013 Cath/PCI: LM nl, LAD 17m (2.5x20 Promus Premier DES), LCX min irregs, RCA dom,  90p(3.0x20 Promus Premier DES), EF 55-65%.   CVA (cerebral vascular accident) ([place]) 01/31/2019   slight memory issues   Essential hypertension, benign    GERD (gastroesophageal reflux disease)    Helicobacter pylori gastritis JUN 2016 EGD/Bx   PREVPAK BID FOR 14 DAYS   Hepatitis    Hepatitis C treated  2011   Hiatal hernia    History of kidney stones    Mixed hyperlipidemia    Neuroma    Prediabetes 06/23/2019   Skin cancer    Substance abuse ([place])    alcoholic quit 8182   Tobacco abuse     Past Surgical History:  Procedure Laterality Date   ABDOMINAL AORTOGRAM W/LOWER EXTREMITY N/A 06/15/2017   Procedure: ABDOMINAL AORTOGRAM W/LOWER EXTREMITY;  Surgeon: Angelia Mould, MD;  Location: [facility];  Service: Cardiovascular;  Laterality: N/A;  Bilateral   BIOPSY N/A 12/19/2014   Procedure: BIOPSY;  Surgeon: Danie Binder, MD;  Location: [facility];  Service: Endoscopy;  Laterality: N/A;   COLONOSCOPY  June 2016   [facility]: with endoscopic mucosal resection. 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 Needs colonoscopy in 1 year.    COLONOSCOPY WITH PROPOFOL N/A 12/19/2014   Dr. Oneida Alar: six simple adenomas and 3 hyperplastic polyps. Had flat mid-transverse colon polyp and referred to [facility] for endoscopic mucosal resection, which  is scheduled for July 22   CORONARY ANGIOPLASTY WITH STENT PLACEMENT  04/11/2013   LAD &  RCA     DR COOPER   ESOPHAGOGASTRODUODENOSCOPY (EGD) WITH PROPOFOL N/A 12/19/2014   Dr. Oneida Alar:  without varices, small hiatal hernia noted, moderate non-erosive gastritis and mild duodenitis.  POSITIVE H.PYLORI. Prescribed Prevpac.   EXCISION MORTON'S NEUROMA Left 08/15/2020   Procedure: EXCISION MORTON'S NEUROMA THIRD INTERSPACE LEFT FOOT;  Surgeon: [name], DPM;  Location: [facility];  Service: Podiatry;  Laterality: Left;   EXTRACORPOREAL SHOCK WAVE LITHOTRIPSY  2001   FLEXIBLE SIGMOIDOSCOPY N/A 12/23/2017   Procedure: FLEXIBLE  SIGMOIDOSCOPY;  Surgeon: Danie Binder, MD;  Location: [facility];  Service: Endoscopy;  Laterality: N/A;  1:45pm   HAND RECONSTRUCTION Left    HEMORRHOID BANDING N/A 12/23/2017   Procedure: HEMORRHOID BANDING;  Surgeon: Danie Binder, MD;  [facility];  Service: Endoscopy;  Laterality: N/A;   LEFT HEART CATHETERIZATION WITH CORONARY ANGIOGRAM N/A 04/11/2013   Procedure: LEFT HEART CATHETERIZATION WITH CORONARY ANGIOGRAM;  Surgeon: Blane Ohara, MD;  Location[facility];  Service: Cardiovascular;  Laterality: N/A;   LEG TENDON SURGERY Right    POLYPECTOMY N/A 12/19/2014   Procedure: POLYPECTOMY;  Surgeon: Danie Binder, MD;  Location: [facility];  Service: Endoscopy;  Laterality: N/A;    Current Medications: Outpatient Medications Prior to Visit  Medication Sig Dispense Refill   amLODipine (NORVASC) 5 MG tablet TAKE ONE TABLET BY MOUTH ONCE DAILY. 90 tablet 3   aspirin EC 81 MG tablet Take 81 mg by mouth at bedtime.      chlorthalidone (HYGROTON) 25 MG tablet TAKE 1 TABLET BY MOUTH DAILY. 30 tablet 6   Cholecalciferol (VITAMIN D3) 2000 units TABS Take 2 tablets by mouth daily. 4000 units per day     cloNIDine (CATAPRES) 0.2 MG tablet TAKE (1) TABLET BY MOUTH TWICE DAILY. 180 tablet 3   clopidogrel (PLAVIX) 75 MG tablet TAKE 1 TABLET BY MOUTH ONCE DAILY. 30 tablet 0   isosorbide mononitrate (IMDUR) 30 MG 24 hr tablet TAKE 1/2 TABLET BY MOUTH ONCE DAILY. 15 tablet 3   metoprolol succinate (TOPROL-XL) 100 MG 24 hr tablet TAKE ONE TABLET BY MOUTH ONCE DAILY. 30 tablet 0   Multiple Vitamin (MULTIVITAMIN WITH MINERALS) TABS tablet Take 1 tablet by mouth daily.     Omega-3 1000 MG CAPS Take 1 capsule (1,000 mg total) by mouth in the morning and at bedtime. (Patient taking differently: Take 2 capsules by mouth in the morning and at bedtime.) 180 capsule 0   pantoprazole (PROTONIX) 40 MG tablet TAKE ONE TABLET BY MOUTH ONCE DAILY. 90 tablet 0   potassium chloride SA (KLOR-CON) 20  MEQ tablet TAKE ONE TABLET BY MOUTH ONCE DAILY. 90 tablet 3   atorvastatin (LIPITOR) 40 MG tablet TAKE ONE TABLET BY MOUTH ONCE DAILY. 90 tablet 0   nitroGLYCERIN (NITROSTAT) 0.4 MG SL tablet Place 1 tablet (0.4 mg total) under the tongue every 5 (five) minutes as needed for chest pain. 25 tablet 3   doxycycline (VIBRA-TABS) 100 MG tablet Take 1 tablet (100 mg total) by mouth 2 (two) times daily. (Patient not taking: No sig reported) 20 tablet 0   oxyCODONE-acetaminophen (PERCOCET) 10-325 MG tablet Take 1 tablet by mouth every 4 (four) hours as needed for pain. (Patient not taking: No sig reported) 30 tablet 0   No facility-administered medications prior to visit.     Allergies:  Lisinopril, Crestor [rosuvastatin], Chantix [varenicline], and Neosporin [neomycin-bacitracin zn-polymyx]   Social History   Socioeconomic History   Marital status: Soil scientist    Spouse name: Tisha   Number of children: 2   Years of education: 14   Highest education level: Not on file  Occupational History   Occupation: disabled    Comment: heart  Tobacco Use   Smoking status: Light Smoker    Packs/day: 0.50    Years: 40.00    Pack years: 20.00    Types: Cigarettes    Start date: 07/14/1968   Smokeless tobacco: Never   Tobacco comments:    5-6 Cigarettes per day  Vaping Use   Vaping Use: Never used  Substance and Sexual Activity   Alcohol use: Yes    Alcohol/week: 0.0 standard drinks    Comment: As of 11/26/17: social - 1-2 beers a sitting x 1-2 times a month   Drug use: Not Currently    Types: Cocaine, Marijuana    Comment: cocaine marijuana and acid last used 1984   Sexual activity: Not Currently  Other Topics Concern   Not on file  Social History Narrative   Disabled from heart disease   Previously worked in Scientist, research (medical) with Neurosurgeon for 28 years   Leisure: Teaching laboratory technician   Social Determinants of Radio broadcast assistant Strain: Not on file  Food Insecurity: Not on file   Transportation Needs: Not on file  Physical Activity: Not on file  Stress: Not on file  Social Connections: Not on file     Family History:  The patient's family history includes Alcohol abuse in his brother; Aneurysm in his mother; Diabetes in his paternal aunt; Heart disease in his brother and father; Hypertension in his brother, father, and mother; Stroke in his mother.   Review of Systems:    Please see the history of present illness.     All other systems reviewed and are otherwise negative except as noted above.   Physical Exam:    VS:  BP (!) 156/78   Pulse 88   Ht 5\' 9"  (1.753 m)   Wt 198 lb (89.8 kg)   SpO2 98%   BMI 29.24 kg/m    General: Well developed, well nourished,male appearing in no acute distress. Head: Normocephalic, atraumatic. Neck: No carotid bruits. JVD not elevated.  Lungs: Respirations regular and unlabored, without wheezes or rales.  Heart: Regular rate and rhythm. No S3 or S4.  No murmur, no rubs, or gallops appreciated. Abdomen: Appears non-distended. No obvious abdominal masses. Msk:  Strength and tone appear normal for age. No obvious joint deformities or effusions. Extremities: No clubbing or cyanosis. No pitting edema.  Distal pedal pulses are 2+ bilaterally. Neuro: Alert and oriented X 3. Moves all extremities spontaneously. No focal deficits noted. Psych:  Responds to questions appropriately with a normal affect. Skin: No rashes or lesions noted  Wt Readings from Last 3 Encounters:  01/18/21 198 lb (89.8 kg)  12/17/20 188 lb 12.8 oz (85.6 kg)  08/15/20 189 lb 9.5 oz (86 kg)     Studies/Labs Reviewed:   EKG:  EKG is ordered today.  The ekg ordered today demonstrates NSR, HR 91 with slight ST depression along the inferior leads which has actually improved when compared to prior tracings.   Recent Labs: 04/09/2020: Hemoglobin 15.1; Platelets 220 12/17/2020: ALT 42; BUN 13; Creat 0.79; Potassium 3.8; Sodium 138   Lipid Panel     Component Value Date/Time  CHOL 225 (H) 12/17/2020 1458   TRIG 931 (H) 12/17/2020 1458   HDL 46 12/17/2020 1458   CHOLHDL 4.9 12/17/2020 1458   VLDL UNABLE TO CALCULATE IF TRIGLYCERIDE OVER 400 mg/dL 02/01/2019 0425   LDLCALC  12/17/2020 1458     Comment:     . LDL cholesterol not calculated. Triglyceride levels greater than 400 mg/dL invalidate calculated LDL results. . Reference range: <100 . Desirable range <100 mg/dL for primary prevention;   <70 mg/dL for patients with CHD or diabetic patients  with > or = 2 CHD risk factors. Marland Kitchen LDL-C is now calculated using the Martin-Hopkins  calculation, which is a validated novel method providing  better accuracy than the Friedewald equation in the  estimation of LDL-C.  Cresenciano Genre et al. Annamaria Helling. 0932;671(24): 2061-2068  (http://education.QuestDiagnostics.com/faq/FAQ164)    LDLDIRECT 100.2 (H) 02/01/2019 0425    Additional studies/ records that were reviewed today include:   Cardiac Catheterization: 09/2016 Coronary Angiography  Dominance: Right   Left Main: The left main (LMCA) is a large-caliber vessel that originates  from the left coronary sinus. It bifurcates into the left anterior  descending (LAD) and left circumflex (LCx) arteries. There is no  angiographic evidence of significant disease in the LMCA.   LAD: The LAD is a large-caliber vessel that gives off three diagonal (D)  branches before it wraps around the apex. D1 is a medium caliber vessel  with mild luminal irregularities. D2 and D3 are small caliber vessels.  There is mild in-stent restenosis in the previously placed LAD stent up to  30%.   Left Circumflex: The LCx is a medium-caliber vessel that gives off two  obtuse marginal (OM) branches and then continues as a small vessel in the  AV groove. OM1 is a very small vessel. OM2 is a small-caliber vessel.  There is no angiographic evidence of significant disease in the LCx.   Right Coronary:  The right coronary  artery (RCA) is a large-caliber vessel  originating from the right coronary sinus. It bifurcates distally into the  posterior descending artery (PDA) and a posterolateral branch (PL)  consistent with a right dominant system. There are mild luminal  irregularities in the distal RCA and rPDA. The previously placed stent in  the RCA is widely patent.   Assessment:    1. Coronary artery disease involving native coronary artery of native heart with unstable angina pectoris ([place])   2. Unstable angina ([place])   3. Essential hypertension   4. Hyperlipidemia LDL goal <70   5. PAD (peripheral artery disease) ([place])   6. Tobacco use      Plan:   In order of problems listed above:  1. CAD/Chest Pain concerning for Unstable Angina - He is s/p DES to LAD and DES to RCA in 03/2013 with patent stents by repeat cath in 08/2016 and mild ISR.  - He reports progressive dyspnea on exertion, fatigue and chest pain as outlined above which resembles his prior angina. Given his high pre-test probability due to his symptoms along with uncontrolled HLD and continued tobacco use, a cardiac catheterization for definitive evaluation would be the next step. Reviewed with Dr. Harrington Challenger (DOD) who was in agreement. The procedure along with risks and benefits were reviewed with the patient and he is in agreement to proceed.  - Continue ASA, Plavix, Toprol-XL, Imdur and statin (with dose adjustment as outlined below). Updated Rx for SL NTG provided.   2. HTN - BP is elevated today but has been  well-controlled at recent office visits. Will continue to follow. Continue current medication regimen for now with Amlodipine 5mg  daily, Chlorthalidone 25mg  daily, Clonidine 0.2mg  BID, Imdur 15mg  daily and Toprol-XL 100mg  daily. If BP remains above goal, could further titrate Amlodipine. Previously had neck swelling on ACE-I, therefore ARB has been avoided as well.   3. HLD - Recent FLP in 12/2020 showed total cholesterol of 225, HDL 46,  triglycerides 931 and non-HDL 179. Will titrate Atorvastatin from 40mg  daily to 80mg  daily. Recheck FLP and LFT's in 2-3 months. If still not at goal, would plan for referral to the [facility] for consideration of PCSK-9 inhibitor therapy. We also reviewed the importance of dietary changes as he currently consumes biscuits, gravy and fried foods regularly.   4. PAD - He does have left subclavian stenosis and known infrainguinal arterial disease. Followed by Dr. Scot Dock. He remains on ASA 81mg  daily, Plavix 75mg  daily and statin therapy.   5. Tobacco Use - He continues to smoke 0.5 - 1.0 ppd. Cessation advised.   Shared Decision Making/Informed Consent:   Shared Decision Making/Informed Consent The risks [stroke (1 in 1000), death (1 in 1000), kidney failure [usually temporary] (1 in 500), bleeding (1 in 200), allergic reaction [possibly serious] (1 in 200)], benefits (diagnostic support and management of coronary artery disease) and alternatives of a cardiac catheterization were discussed in detail with Mr. Soth and he is willing to proceed.    Medication Adjustments/Labs and Tests Ordered: Current medicines are reviewed at length with the patient today.  Concerns regarding medicines are outlined above.  Medication changes, Labs and Tests ordered today are listed in the Patient Instructions below. Patient Instructions  Medication Instructions:   INCREASE Atorvastatin to 80 mg daily with dinner   I refilled your nitroglycerin   *If you need a refill on your cardiac medications before your next appointment, please call your pharmacy*   Lab Work: CBC, BMET  Monday 01/21/21 at [facility] outpatient lab   If you have labs (blood work) drawn today and your tests are completely normal, you will receive your results only by: [facility] (if you have MyChart) OR A paper copy in the mail If you have any lab test that is abnormal or we need to change your treatment, we will call you  to review the results.   Testing/Procedures: Your physician has requested that you have a cardiac catheterization. Cardiac catheterization is used to diagnose and/or treat various heart conditions. Doctors may recommend this procedure for a number of different reasons. The most common reason is to evaluate chest pain. Chest pain can be a symptom of coronary artery disease (CAD), and cardiac catheterization can show whether plaque is narrowing or blocking your heart's arteries. This procedure is also used to evaluate the valves, as well as measure the blood flow and oxygen levels in different parts of your heart. For further information please visit [email or web address]. Please follow instruction sheet, as given.    Follow-Up: At [facility], you and your health needs are our priority.  As part of our continuing mission to provide you with exceptional heart care, we have created designated Provider Care Teams.  These Care Teams include your primary Cardiologist (physician) and Advanced Practice Providers (APPs -  Physician Assistants and Nurse Practitioners) who all work together to provide you with the care you need, when you need it.  We recommend signing up for the patient portal called "MyChart".  Sign up information is provided on this After Visit  Summary.  MyChart is used to connect with patients for Virtual Visits (Telemedicine).  Patients are able to view lab/test results, encounter notes, upcoming appointments, etc.  Non-urgent messages can be sent to your provider as well.   To learn more about what you can do with MyChart, go to [email or web address].    Your next appointment:   1 month(s)  The format for your next appointment:   In Person  Provider:   You may see Carlyle Dolly, MD or one of the following Advanced Practice Providers on your designated Care Team:   Bernerd Pho, PA-C  Ermalinda Barrios, PA-C    Other Instructions None    Signed, Erma Heritage, Hershal Coria   01/19/2021 9:44 AM    [place] [address] [place], [place] [address] Phone: [phone] Fax: [phone]

## 2021-01-19 ENCOUNTER — Encounter: Payer: Self-pay | Admitting: Student

## 2021-01-21 ENCOUNTER — Other Ambulatory Visit: Payer: Self-pay

## 2021-01-21 ENCOUNTER — Other Ambulatory Visit (HOSPITAL_COMMUNITY)
Admission: RE | Admit: 2021-01-21 | Discharge: 2021-01-21 | Disposition: A | Discharge status: Home / Self Care | Payer: Medicaid Other | Source: Ambulatory Visit | Attending: Student | Admitting: Student

## 2021-01-21 ENCOUNTER — Telehealth: Payer: Self-pay | Admitting: *Deleted

## 2021-01-21 DIAGNOSIS — I200 Unstable angina: Secondary | ICD-10-CM | POA: Diagnosis not present

## 2021-01-21 LAB — CBC
HCT: 42.9 % (ref 39.0–52.0)
Hemoglobin: 14.8 g/dL (ref 13.0–17.0)
MCH: 31.6 pg (ref 26.0–34.0)
MCHC: 34.5 g/dL (ref 30.0–36.0)
MCV: 91.7 fL (ref 80.0–100.0)
Platelets: 191 10*3/uL (ref 150–400)
RBC: 4.68 MIL/uL (ref 4.22–5.81)
RDW: 13 % (ref 11.5–15.5)
WBC: 6.8 10*3/uL (ref 4.0–10.5)
nRBC: 0 % (ref 0.0–0.2)

## 2021-01-21 LAB — BASIC METABOLIC PANEL
Anion gap: 12 (ref 5–15)
BUN: 12 mg/dL (ref 8–23)
CO2: 26 mmol/L (ref 22–32)
Calcium: 9.3 mg/dL (ref 8.9–10.3)
Chloride: 98 mmol/L (ref 98–111)
Creatinine, Ser: 0.75 mg/dL (ref 0.61–1.24)
GFR, Estimated: >60 mL/min (ref >60)
Glucose, Bld: 120 mg/dL — ABNORMAL HIGH (ref 70–99)
Potassium: 3.4 mmol/L — ABNORMAL LOW (ref 3.5–5.1)
Sodium: 136 mmol/L (ref 135–145)

## 2021-01-21 NOTE — Telephone Encounter (Signed)
Reviewed procedure/mask/visitor instructions with patient.  01/21/21 K 3.4- see BMP results-pt instructed to take extra KCl tonight and take KCl in the morning before leaving for hospital for cath, hold chlorthalidone in the AM.

## 2021-01-21 NOTE — Telephone Encounter (Signed)
LMTCB for patient to review procedure instructions

## 2021-01-21 NOTE — Telephone Encounter (Signed)
No answer, voicemail message. 

## 2021-01-21 NOTE — Telephone Encounter (Addendum)
Pt contacted pre-catheterization scheduled at [facility] for: Tuesday January 22, 2021 9 AM Verified arrival time and place: [facility]) at: 7 AM   No solid food after midnight prior to cath, clear liquids until 5 AM day of procedure.  Hold: Chlorthalidone-AM of procedure  Except hold medications AM meds can be  taken pre-cath with sips of water including: aspirin 81 mg Plavix 75 mg  Confirmed patient has responsible adult to drive home post procedure and be with patient first 24 hours after arriving home:  You are allowed ONE visitor in the waiting room during the time you are at the hospital for your procedure. Both you and your visitor must wear a mask once you enter the hospital.   Patient reports does not currently have any symptoms concerning for COVID-19 and no household members with COVID-19 like illness.     Reviewed procedure/mask/visitor instructions with patient.

## 2021-01-21 NOTE — Telephone Encounter (Signed)
Zen is returning Anne's call in regards to his procedure tomorrow. Please advise.

## 2021-01-22 ENCOUNTER — Other Ambulatory Visit: Payer: Self-pay

## 2021-01-22 ENCOUNTER — Encounter (HOSPITAL_COMMUNITY)
Admission: RE | Disposition: A | Discharge status: Home / Self Care | Payer: Self-pay | Source: Home / Self Care | Attending: Cardiology

## 2021-01-22 ENCOUNTER — Encounter (HOSPITAL_COMMUNITY): Payer: Self-pay | Admitting: Cardiology

## 2021-01-22 ENCOUNTER — Ambulatory Visit (HOSPITAL_COMMUNITY)
Admission: RE | Admit: 2021-01-22 | Discharge: 2021-01-22 | Disposition: A | Discharge status: Home / Self Care | Payer: Medicaid Other | Attending: Cardiology | Admitting: Cardiology

## 2021-01-22 DIAGNOSIS — Z888 Allergy status to other drugs, medicaments and biological substances status: Secondary | ICD-10-CM | POA: Insufficient documentation

## 2021-01-22 DIAGNOSIS — Z7982 Long term (current) use of aspirin: Secondary | ICD-10-CM | POA: Insufficient documentation

## 2021-01-22 DIAGNOSIS — E782 Mixed hyperlipidemia: Secondary | ICD-10-CM | POA: Insufficient documentation

## 2021-01-22 DIAGNOSIS — Z955 Presence of coronary angioplasty implant and graft: Secondary | ICD-10-CM | POA: Insufficient documentation

## 2021-01-22 DIAGNOSIS — Z7902 Long term (current) use of antithrombotics/antiplatelets: Secondary | ICD-10-CM | POA: Diagnosis not present

## 2021-01-22 DIAGNOSIS — F17210 Nicotine dependence, cigarettes, uncomplicated: Secondary | ICD-10-CM | POA: Diagnosis not present

## 2021-01-22 DIAGNOSIS — I25110 Atherosclerotic heart disease of native coronary artery with unstable angina pectoris: Secondary | ICD-10-CM | POA: Diagnosis not present

## 2021-01-22 DIAGNOSIS — I10 Essential (primary) hypertension: Secondary | ICD-10-CM | POA: Diagnosis not present

## 2021-01-22 DIAGNOSIS — E785 Hyperlipidemia, unspecified: Secondary | ICD-10-CM | POA: Diagnosis present

## 2021-01-22 DIAGNOSIS — I200 Unstable angina: Secondary | ICD-10-CM | POA: Diagnosis present

## 2021-01-22 DIAGNOSIS — Z79899 Other long term (current) drug therapy: Secondary | ICD-10-CM | POA: Diagnosis not present

## 2021-01-22 DIAGNOSIS — I2510 Atherosclerotic heart disease of native coronary artery without angina pectoris: Secondary | ICD-10-CM | POA: Diagnosis present

## 2021-01-22 DIAGNOSIS — I739 Peripheral vascular disease, unspecified: Secondary | ICD-10-CM | POA: Diagnosis not present

## 2021-01-22 HISTORY — PX: LEFT HEART CATH AND CORONARY ANGIOGRAPHY: CATH118249

## 2021-01-22 LAB — POCT I-STAT, CHEM 8
BUN: 12 mg/dL (ref 8–23)
Calcium, Ion: 1.19 mmol/L (ref 1.15–1.40)
Chloride: 103 mmol/L (ref 98–111)
Creatinine, Ser: 0.7 mg/dL (ref 0.61–1.24)
Glucose, Bld: 129 mg/dL — ABNORMAL HIGH (ref 70–99)
HCT: 38 % — ABNORMAL LOW (ref 39.0–52.0)
Hemoglobin: 12.9 g/dL — ABNORMAL LOW (ref 13.0–17.0)
Potassium: 3.3 mmol/L — ABNORMAL LOW (ref 3.5–5.1)
Sodium: 140 mmol/L (ref 135–145)
TCO2: 26 mmol/L (ref 22–32)

## 2021-01-22 SURGERY — LEFT HEART CATH AND CORONARY ANGIOGRAPHY
Anesthesia: LOCAL

## 2021-01-22 MED ORDER — SODIUM CHLORIDE 0.9 % WEIGHT BASED INFUSION: 1.0000 mL/kg/h | INTRAVENOUS | Status: AC

## 2021-01-22 MED ORDER — SODIUM CHLORIDE 0.9% FLUSH: 3.0000 mL | INTRAVENOUS | Status: DC | PRN

## 2021-01-22 MED ORDER — FENTANYL CITRATE (PF) 100 MCG/2ML IJ SOLN
INTRAMUSCULAR | Status: AC
  Filled 2021-01-22: qty 2

## 2021-01-22 MED ORDER — VERAPAMIL HCL 2.5 MG/ML IV SOLN
INTRAVENOUS | Status: DC | PRN
  Administered 2021-01-22: 10 mL via INTRA_ARTERIAL

## 2021-01-22 MED ORDER — SODIUM CHLORIDE 0.9 % WEIGHT BASED INFUSION
3.0000 mL/kg/h | INTRAVENOUS | Status: AC
Start: 2021-01-22 — End: 2021-01-22
  Administered 2021-01-22: 3 mL/kg/h via INTRAVENOUS

## 2021-01-22 MED ORDER — MIDAZOLAM HCL 2 MG/2ML IJ SOLN
INTRAMUSCULAR | Status: DC | PRN
  Administered 2021-01-22: 2 mg via INTRAVENOUS

## 2021-01-22 MED ORDER — HEPARIN SODIUM (PORCINE) 1000 UNIT/ML IJ SOLN
INTRAMUSCULAR | Status: DC | PRN
  Administered 2021-01-22: 4500 [IU] via INTRAVENOUS

## 2021-01-22 MED ORDER — HEPARIN (PORCINE) IN NACL 1000-0.9 UT/500ML-% IV SOLN
INTRAVENOUS | Status: AC
  Filled 2021-01-22: qty 500

## 2021-01-22 MED ORDER — SODIUM CHLORIDE 0.9% FLUSH: 3.0000 mL | Freq: Two times a day (BID) | INTRAVENOUS | Status: DC

## 2021-01-22 MED ORDER — ACETAMINOPHEN 325 MG PO TABS: 650.0000 mg | ORAL_TABLET | ORAL | Status: DC | PRN

## 2021-01-22 MED ORDER — IOHEXOL 350 MG/ML SOLN
INTRAVENOUS | Status: DC | PRN
  Administered 2021-01-22: 55 mL

## 2021-01-22 MED ORDER — VERAPAMIL HCL 2.5 MG/ML IV SOLN
INTRAVENOUS | Status: AC
  Filled 2021-01-22: qty 2

## 2021-01-22 MED ORDER — MIDAZOLAM HCL 2 MG/2ML IJ SOLN
INTRAMUSCULAR | Status: AC
  Filled 2021-01-22: qty 2

## 2021-01-22 MED ORDER — ASPIRIN 81 MG PO CHEW: 81.0000 mg | CHEWABLE_TABLET | ORAL | Status: DC

## 2021-01-22 MED ORDER — LIDOCAINE HCL (PF) 1 % IJ SOLN
INTRAMUSCULAR | Status: AC
  Filled 2021-01-22: qty 30

## 2021-01-22 MED ORDER — ONDANSETRON HCL 4 MG/2ML IJ SOLN: 4.0000 mg | Freq: Four times a day (QID) | INTRAMUSCULAR | Status: DC | PRN

## 2021-01-22 MED ORDER — SODIUM CHLORIDE 0.9 % IV SOLN: 250.0000 mL | INTRAVENOUS | Status: DC | PRN

## 2021-01-22 MED ORDER — SODIUM CHLORIDE 0.9 % WEIGHT BASED INFUSION: 1.0000 mL/kg/h | INTRAVENOUS | Status: DC

## 2021-01-22 MED ORDER — POTASSIUM CHLORIDE CRYS ER 20 MEQ PO TBCR
EXTENDED_RELEASE_TABLET | ORAL | Status: AC
  Filled 2021-01-22: qty 2

## 2021-01-22 MED ORDER — LIDOCAINE HCL (PF) 1 % IJ SOLN
INTRAMUSCULAR | Status: DC | PRN
  Administered 2021-01-22: 2 mL

## 2021-01-22 MED ORDER — POTASSIUM CHLORIDE CRYS ER 20 MEQ PO TBCR
40.0000 meq | EXTENDED_RELEASE_TABLET | Freq: Once | ORAL | Status: AC
  Administered 2021-01-22: 40 meq via ORAL
  Filled 2021-01-22: qty 2

## 2021-01-22 MED ORDER — FENTANYL CITRATE (PF) 100 MCG/2ML IJ SOLN
INTRAMUSCULAR | Status: DC | PRN
  Administered 2021-01-22: 25 ug via INTRAVENOUS

## 2021-01-22 MED ORDER — HEPARIN (PORCINE) IN NACL 1000-0.9 UT/500ML-% IV SOLN
INTRAVENOUS | Status: DC | PRN
  Administered 2021-01-22 (×2): 500 mL

## 2021-01-22 MED ORDER — HEPARIN SODIUM (PORCINE) 1000 UNIT/ML IJ SOLN
INTRAMUSCULAR | Status: AC
  Filled 2021-01-22: qty 1

## 2021-01-22 SURGICAL SUPPLY — 8 items
CATH 5FR JL3.5 JR4 ANG PIG MP (CATHETERS) ×1 IMPLANT
DEVICE RAD COMP TR BAND LRG (VASCULAR PRODUCTS) ×1 IMPLANT
GLIDESHEATH SLEND SS 6F .021 (SHEATH) ×1 IMPLANT
KIT HEART LEFT (KITS) ×2 IMPLANT
PACK CARDIAC CATHETERIZATION (CUSTOM PROCEDURE TRAY) ×2 IMPLANT
TRANSDUCER W/STOPCOCK (MISCELLANEOUS) ×2 IMPLANT
TUBING CIL FLEX 10 FLL-RA (TUBING) ×2 IMPLANT
WIRE HI TORQ VERSACORE J 260CM (WIRE) ×1 IMPLANT

## 2021-01-22 NOTE — Progress Notes (Signed)
Pt ambulated without difficulty or bleeding.   Discharged home with his wife who will drive and stay with pt x 24 hrs. 

## 2021-01-22 NOTE — Discharge Instructions (Signed)
Radial Site Care  This sheet gives you information about how to care for yourself after your procedure. Your health care provider may also give you more specific instructions. If you have problems or questions, contact your health care provider. What can I expect after the procedure? After the procedure, it is common to have: Bruising and tenderness at the catheter insertion area. Follow these instructions at home: Medicines Take over-the-counter and prescription medicines only as told by your health care provider. Insertion site care Follow instructions from your health care provider about how to take care of your insertion site. Make sure you: Wash your hands with soap and water before you remove your bandage (dressing). If soap and water are not available, use hand sanitizer. May remove dressing in 24 hours. Check your insertion site every day for signs of infection. Check for: Redness, swelling, or pain. Fluid or blood. Pus or a bad smell. Warmth. Do no take baths, swim, or use a hot tub for 5 days. You may shower 24-48 hours after the procedure. Remove the dressing and gently wash the site with plain soap and water. Pat the area dry with a clean towel. Do not rub the site. That could cause bleeding. Do not apply powder or lotion to the site. Activity  For 24 hours after the procedure, or as directed by your health care provider: Do not flex or bend the affected arm. Do not push or pull heavy objects with the affected arm. Do not drive yourself home from the hospital or clinic. You may drive 24 hours after the procedure. Do not operate machinery or power tools. KEEP ARM ELEVATED THE REMAINDER OF THE DAY. Do not push, pull or lift anything that is heavier than 10 lb for 5 days. Ask your health care provider when it is okay to: Return to work or school. Resume usual physical activities or sports. Resume sexual activity. General instructions If the catheter site starts to  bleed, raise your arm and put firm pressure on the site. If the bleeding does not stop, get help right away. This is a medical emergency. DRINK PLENTY OF FLUIDS FOR THE NEXT 2-3 DAYS. No alcohol consumption for 24 hours after receiving sedation. If you went home on the same day as your procedure, a responsible adult should be with you for the first 24 hours after you arrive home. Keep all follow-up visits as told by your health care provider. This is important. Contact a health care provider if: You have a fever. You have redness, swelling, or yellow drainage around your insertion site. Get help right away if: You have unusual pain at the radial site. The catheter insertion area swells very fast. The insertion area is bleeding, and the bleeding does not stop when you hold steady pressure on the area. Your arm or hand becomes pale, cool, tingly, or numb. These symptoms may represent a serious problem that is an emergency. Do not wait to see if the symptoms will go away. Get medical help right away. Call your local emergency services (911 in the [place] Do not drive yourself to the hospital. Summary After the procedure, it is common to have bruising and tenderness at the site. Follow instructions from your health care provider about how to take care of your radial site wound. Check the wound every day for signs of infection.  This information is not intended to replace advice given to you by your health care provider. Make sure you discuss any questions you have with   your health care provider. Document Revised: 08/05/2017 Document Reviewed: 08/05/2017 Elsevier Patient Education  2020 Elsevier Inc.  

## 2021-01-22 NOTE — Interval H&P Note (Signed)
History and Physical Interval Note:  01/22/2021 8:22 AM  Duane Adams  has presented today for surgery, with the diagnosis of unstable angina.  The various methods of treatment have been discussed with the patient and family. After consideration of risks, benefits and other options for treatment, the patient has consented to  Procedure(s): LEFT HEART CATH AND CORONARY ANGIOGRAPHY (N/A) as a surgical intervention.  The patient's history has been reviewed, patient examined, no change in status, stable for surgery.  I have reviewed the patient's chart and labs.  Questions were answered to the patient's satisfaction.   Cath Lab Visit (complete for each Cath Lab visit)  Clinical Evaluation Leading to the Procedure:   ACS: Yes.    Non-ACS:    Anginal Classification: CCS III  Anti-ischemic medical therapy: Maximal Therapy (2 or more classes of medications)  Non-Invasive Test Results: No non-invasive testing performed  Prior CABG: No previous CABG        Collier Salina [facility] 01/22/2021 8:22 AM

## 2021-01-25 ENCOUNTER — Ambulatory Visit (HOSPITAL_COMMUNITY): Payer: Medicaid Other | Attending: Internal Medicine

## 2021-01-25 ENCOUNTER — Other Ambulatory Visit: Payer: Self-pay

## 2021-01-25 DIAGNOSIS — M545 Low back pain, unspecified: Secondary | ICD-10-CM | POA: Insufficient documentation

## 2021-01-25 DIAGNOSIS — R262 Difficulty in walking, not elsewhere classified: Secondary | ICD-10-CM | POA: Diagnosis not present

## 2021-01-25 DIAGNOSIS — G8929 Other chronic pain: Secondary | ICD-10-CM | POA: Insufficient documentation

## 2021-01-25 DIAGNOSIS — M6281 Muscle weakness (generalized): Secondary | ICD-10-CM | POA: Diagnosis not present

## 2021-01-25 NOTE — Therapy (Signed)
[facility] [address] [place], [place], [address] Phone: [phone]   Fax:  [phone]  Physical Therapy Evaluation  Patient Details  Name: Duane Adams MRN: [id number] Date of Birth: [birth year] Referring Provider (PT): Doree Albee, MD   Encounter Date: 01/25/2021   PT End of Session - 01/25/21 1032     Visit Number 1    Number of Visits 4    Date for PT Re-Evaluation 02/22/21    Authorization Type [place] Medicaid [employer]    PT Start Time 1030    PT Stop Time 1115    PT Time Calculation (min) 45 min    Activity Tolerance Patient tolerated treatment well    Behavior During Therapy [facility] for tasks assessed/performed             Past Medical History:  Diagnosis Date   Allergy    Anxiety    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 03/2013 Cath/PCI: LM nl, LAD 15m (2.5x20 Promus Premier DES), LCX min irregs, RCA dom, 90p(3.0x20 Promus Premier DES), EF 55-65%.   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 Needs colonoscopy in 1 year.    COLONOSCOPY WITH PROPOFOL N/A 12/19/2014   Dr. Oneida Alar: six simple adenomas and 3 hyperplastic polyps. Had flat mid-transverse colon polyp and referred to [facility] for endoscopic mucosal resection, which is scheduled for July 22   CORONARY ANGIOPLASTY WITH STENT PLACEMENT  04/11/2013   LAD &  RCA     DR COOPER   ESOPHAGOGASTRODUODENOSCOPY (EGD) WITH PROPOFOL N/A 12/19/2014   Dr. Oneida Alar:  without varices, small hiatal hernia noted, moderate non-erosive gastritis and mild duodenitis.  POSITIVE H.PYLORI. Prescribed Prevpac.   EXCISION MORTON'S NEUROMA Left 08/15/2020   Procedure: EXCISION MORTON'S NEUROMA THIRD INTERSPACE LEFT FOOT;  Surgeon: [name], DPM;  Location: [facility];  Service: Podiatry;  Laterality: Left;   EXTRACORPOREAL SHOCK WAVE LITHOTRIPSY  2001   FLEXIBLE SIGMOIDOSCOPY N/A 12/23/2017   Procedure: FLEXIBLE SIGMOIDOSCOPY;  Surgeon: Danie Binder, MD;  [facility];  Service: Endoscopy;  Laterality: N/A;  1:45pm   HAND RECONSTRUCTION Left    HEMORRHOID BANDING N/A 12/23/2017   Procedure: HEMORRHOID BANDING;  Surgeon: Danie Binder, MD;  [facility];  Service: Endoscopy;  Laterality: N/A;  LEFT HEART CATH AND CORONARY ANGIOGRAPHY N/A 01/22/2021   Procedure: LEFT HEART CATH AND CORONARY ANGIOGRAPHY;  Surgeon: Martinique, Peter M, MD;  Location: [place] [facility];  Service: Cardiovascular;  Laterality: N/A;   LEFT HEART CATHETERIZATION WITH CORONARY ANGIOGRAM N/A 04/11/2013   Procedure: LEFT HEART CATHETERIZATION WITH CORONARY ANGIOGRAM;  Surgeon: Blane Ohara, MD;  [facility]: [facility] C[facility] LAB;  Service: Cardiovascular;  Laterality: N/A;   LEG TENDON SURGERY Right    POLYPECTOMY N/A 12/19/2014   Procedure: POLYPECTOMY;  Surgeon: Danie Binder, MD;  [facility];  Service: Endoscopy;  Laterality: N/A;    There  were no vitals filed for this visit.    Subjective Assessment - 01/25/21 1034     Subjective Pt notes hx of LBP and main area of discomfort is in buttocks and hips with main area in left buttocks and notes pronounced decrease in his walking distance/tolerance    Limitations Standing;Walking;House hold activities    How long can you walk comfortably? increased difficulty with start/stop walking    Currently in Pain? Yes    Pain Score 2     Pain Location Buttocks    Pain Orientation Left    Pain Descriptors / Indicators Aching    Pain Type Chronic pain    Aggravating Factors  prolonged standing, walking    Pain Relieving Factors sitting                [facility] PT Assessment - 01/25/21 0001       Assessment   Medical Diagnosis LBP without sciatic    Referring Provider (PT) Doree Albee, MD    Prior Therapy none for low back      Balance Screen   Has the patient fallen in the past 6 months No    Has the patient had a decrease in activity level because of a fear of falling?  No    Is the patient reluctant to leave their home because of a fear of falling?  No      Home Environment   Living Environment Private residence    Living Arrangements Spouse/significant other    Available Help at Discharge Family    Type of [place] to enter    Entrance Stairs-Number of Steps 4    [place] One level      Prior Function   Level of Independence Independent    Leisure forging, knife making, guitar building      ROM / Strength   AROM / PROM / Strength AROM;Strength      AROM   AROM Assessment Site Lumbar    Lumbar Flexion 10% limited    Lumbar Extension 25% limited. Increased symptoms      Strength   Strength Assessment Site Lumbar    Lumbar Flexion 2+/5    Lumbar Extension 2+/5      Ambulation/Gait   Ambulation/Gait Yes    Ambulation/Gait Assistance 7: Independent    Ambulation Distance (Feet) 250 Feet    Assistive device None    Gait  Pattern Within Functional Limits    Ambulation Surface Level;Indoor    Gait Comments 2MWT, onset of referred symptoms in buttocks                        Objective measurements completed on examination: See above findings.       [facility] [place] Adult PT Treatment/Exercise - 01/25/21 0001  Exercises   Exercises Lumbar      Lumbar Exercises: Stretches   Single Knee to Chest Stretch Right;Left;2 reps;30 seconds    Double Knee to Chest Stretch 2 reps;30 seconds    Pelvic Tilt 10 reps;5 seconds                    PT Education - 01/25/21 1133     Education Details education on lumbar stenosis, benefits of regular exercise, flexion-bias movement principles, HEP initiation    Person(s) Educated Patient    Methods Explanation;Demonstration    Comprehension Verbalized understanding              PT Short Term Goals - 01/25/21 1137       PT SHORT TERM GOAL #1   Title Patient will be independent with HEP in order to improve functional outcomes.    Time 2    Period Weeks    Status New    Target Date 02/08/21      PT SHORT TERM GOAL #2   Title Demonstrate improved gait efficiency as evidenced by ability to walk 300 ft before neurogenic claudication symptoms appear    Baseline 250 ft    Time 2    Period Weeks    Status New    Target Date 02/08/21               PT Long Term Goals - 01/25/21 1138       PT LONG TERM GOAL #1   Title Patient will report at least 25% improvement in symptoms for improved quality of life.    Time 4    Period Weeks    Status New    Target Date 02/22/21      PT LONG TERM GOAL #2   Title Demo core/trunk strength to 3/5 to facilitate improved stability and decrease pain when lifting/carrying items    Baseline 2+/5 flexion/extension    Time 4    Period Weeks    Status New    Target Date 02/22/21      PT LONG TERM GOAL #3   Title Demonstrate improved gait efficiency as evidenced by 400 ft distance during 2MWT     Baseline 250 ft    Time 4    Period Weeks    Status New    Target Date 02/22/21                    Plan - 01/25/21 1134     Clinical Impression Statement Pt is 65 yo male with report of chronic LBP and radiating symptoms into bilateral buttocks/hips with prolonged standing and walking which limits activity tolerance/participation and demonstrates generalized weakness and reduced functional activity tolerance.  Pt would benefit from PT services to develop/instruct in effective HEP and improved functional status and education in flexion-biased activities to improve activity participation while minimizing referred LE symptoms    Personal Factors and Comorbidities Age;Comorbidity 2;Time since onset of injury/illness/exacerbation    Comorbidities LSS, cardiac issues    Examination-Activity Limitations Carry;Lift;Stand;Stairs;Squat;Locomotion Level    Examination-Participation Restrictions Art gallery manager;Yard Work    Stability/Clinical Decision Making Stable/Uncomplicated    Designer, jewellery Low    Rehab Potential Good    PT Frequency 1x / week    PT Duration 4 weeks    PT Treatment/Interventions DME Instruction;Gait training;Stair training;Functional mobility training;Therapeutic activities;Therapeutic exercise;Balance training;Patient/family education;Neuromuscular re-education;Manual techniques;Passive range of motion    PT Next Visit Plan cycling, core strength,  PT Home Exercise Plan SKTC, DKTC, PPT    Consulted and Agree with Plan of Care Patient             Patient will benefit from skilled therapeutic intervention in order to improve the following deficits and impairments:  Cardiopulmonary status limiting activity, Decreased activity tolerance, Decreased balance, Decreased strength, Decreased range of motion, Difficulty walking, Improper body mechanics, Pain  Visit Diagnosis: Chronic low back pain without sciatica, unspecified back pain  laterality  Muscle weakness (generalized)  Difficulty in walking, not elsewhere classified     Problem List Patient Active Problem List   Diagnosis Date Noted   Unstable angina ([place]) 01/22/2021   Vitamin D deficiency 04/09/2020   Morton's neuroma of left foot 04/09/2020   Insomnia 09/26/2019   Prediabetes 06/23/2019   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   CVA (cerebral vascular accident) ([place]) 01/31/2019   Rectal bleeding 11/26/2017   Hemorrhoids 11/26/2017   History of colonic polyps 11/26/2017   PAD (peripheral artery disease) ([place]) 05/05/2017   Intermittent claudication ([place]) 05/05/2017   Hepatitis C virus infection resolved after antiviral drug therapy 05/05/2017   Cirrhosis of liver without ascites ([place]) 05/05/2017   Esophageal reflux 07/23/2015   Nicotine dependence, cigarettes, with other nicotine-induced disorders 07/23/2015   Hyperlipidemia 06/13/2015   Colon polyps 01/14/2015   Essential hypertension 04/12/2013   Coronary artery disease    Unspecified malignant neoplasm of skin of lower limb, including hip 03/16/2013   11:40 AM, 01/25/21 M. Sherlyn Lees, PT, DPT Physical Therapist- [place] Office Number: [phone]   [facility] [address] [place], [place], [address] Phone: [phone]   Fax:  [phone]  Name: Duane Adams MRN: [id number] Date of Birth: [birth year]

## 2021-01-25 NOTE — Patient Instructions (Signed)
Access Code: [email or web address] URL: [email or web address] Date: 01/25/2021 Prepared by: Sherlyn Lees  Exercises Hooklying Single Knee to Chest - 1 x daily - 7 x weekly - 3 sets - 5 reps - 30-60 sec hold Supine Double Knee to Chest - 1 x daily - 7 x weekly - 3 sets - 5 reps - 30-60 sec hold Supine Posterior Pelvic Tilt - 1 x daily - 7 x weekly - 3 sets - 10 reps

## 2021-02-04 ENCOUNTER — Ambulatory Visit (HOSPITAL_COMMUNITY): Payer: Medicaid Other | Admitting: Physical Therapy

## 2021-02-04 ENCOUNTER — Encounter (HOSPITAL_COMMUNITY): Payer: Self-pay | Admitting: Physical Therapy

## 2021-02-04 ENCOUNTER — Other Ambulatory Visit: Payer: Self-pay

## 2021-02-04 DIAGNOSIS — M545 Low back pain, unspecified: Secondary | ICD-10-CM | POA: Diagnosis not present

## 2021-02-04 DIAGNOSIS — M6281 Muscle weakness (generalized): Secondary | ICD-10-CM

## 2021-02-04 DIAGNOSIS — G8929 Other chronic pain: Secondary | ICD-10-CM

## 2021-02-04 DIAGNOSIS — R262 Difficulty in walking, not elsewhere classified: Secondary | ICD-10-CM | POA: Diagnosis not present

## 2021-02-04 NOTE — Therapy (Signed)
[facility] [place], [place], [address] Phone: [phone]   Fax:  [phone]  Physical Therapy Treatment  Patient Details  Name: Duane Adams MRN: [id number] Date of Birth: [birth year] Referring Provider (PT): Doree Albee, MD   Encounter Date: 02/04/2021   PT End of Session - 02/04/21 1046     Visit Number 2    Number of Visits 4    Date for PT Re-Evaluation 02/22/21    Authorization Type [place] Medicaid [employer]    PT Start Time 1046    PT Stop Time 1124    PT Time Calculation (min) 38 min    Activity Tolerance Patient tolerated treatment well    Behavior During Therapy [facility] for tasks assessed/performed             Past Medical History:  Diagnosis Date   Allergy    Anxiety    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 03/2013 Cath/PCI: LM nl, LAD 16m(2.5x20 Promus Premier DES), LCX min irregs, RCA dom, 90p(3.0x20 Promus Premier DES), EF 55-65%.   CVA (cerebral vascular accident) ([place] 01/31/2019   slight memory issues   Essential hypertension, benign    GERD (gastroesophageal reflux disease)    Helicobacter pylori gastritis JUN 2016 EGD/Bx   PREVPAK BID FOR 14 DAYS   Hepatitis    Hepatitis C treated  2011   Hiatal hernia    History of kidney stones    Mixed hyperlipidemia    Neuroma    Prediabetes 06/23/2019   Skin cancer    [place]    alcoholic quit [id number]  Tobacco abuse     Past Surgical History:  Procedure Laterality Date   ABDOMINAL AORTOGRAM W/LOWER EXTREMITY N/A 06/15/2017   Procedure: ABDOMINAL AORTOGRAM W/LOWER EXTREMITY;  Surgeon: DAngelia Mould MD;  [facility];  Service: Cardiovascular;  Laterality: N/A;  Bilateral   BIOPSY N/A 12/19/2014   Procedure: BIOPSY;  Surgeon: SDanie Binder MD;  [facility]: [facility];  Service: Endoscopy;  Laterality: N/A;   COLONOSCOPY  June  2016   [facility]: with endoscopic mucosal resection. 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 Needs colonoscopy in 1 year.    COLONOSCOPY WITH PROPOFOL N/A 12/19/2014   Dr. FOneida Alar six simple adenomas and 3 hyperplastic polyps. Had flat mid-transverse colon polyp [facility]   CORONARY ANGIOPLASTY WITH STENT PLACEMENT  04/11/2013   LAD &  RCA     DR COOPER   ESOPHAGOGASTRODUODENOSCOPY (EGD) WITH PROPOFOL N/A 12/19/2014   Dr. FOneida Alar  without varices, small hiatal hernia noted, moderate non-erosive gastritis and mild duodenitis.  POSITIVE H.PYLORI. Prescribed Prevpac.   EXCISION MORTON'S NEUROMA Left 08/15/2020   Procedure: EXCISION MORTON'S NEUROMA THIRD INTERSPACE LEFT FOOT;  Surgeon: PFelipa Furnace DPM;  [place]  Service: Podiatry;  Laterality: Left;   EXTRACORPOREAL SHOCK WAVE LITHOTRIPSY  2001   FLEXIBLE SIGMOIDOSCOPY N/A 12/23/2017   Procedure: FLEXIBLE SIGMOIDOSCOPY;  Surgeon: FDanie Binder MD;  [facility];  Service: Endoscopy;  Laterality: N/A;  1:45pm   HAND RECONSTRUCTION Left    HEMORRHOID BANDING N/A 12/23/2017   Procedure: HEMORRHOID BANDING;  Surgeon: FDanie Binder MD;  [facility];  Service: Endoscopy;  Laterality: N/A;  LEFT HEART CATH AND CORONARY ANGIOGRAPHY N/A 01/22/2021   Procedure: LEFT HEART CATH AND CORONARY ANGIOGRAPHY;  Surgeon: Martinique, Peter M, MD;  Location: [place] [facility];  Service: Cardiovascular;  Laterality: N/A;   LEFT HEART CATHETERIZATION WITH CORONARY ANGIOGRAM N/A 04/11/2013   Procedure: LEFT HEART CATHETERIZATION WITH CORONARY ANGIOGRAM;  Surgeon: Blane Ohara, MD;  Location: [facility] C[facility] LAB;  Service: Cardiovascular;  Laterality: N/A;   LEG TENDON SURGERY Right    POLYPECTOMY N/A 12/19/2014   Procedure: POLYPECTOMY;  Surgeon: Danie Binder, MD;  [facility];  Service: Endoscopy;  Laterality: N/A;    There  were no vitals filed for this visit.   Subjective Assessment - 02/04/21 1052     Subjective States that he doesn't have back pain but has hip pain and in the buttocks and states that his legs hurt when he walks. States that he mowed the grass and he has to sit down a lot. States he has no current pain the pain is only when he walks around. States that every day is different.  States that he did the exercises every day 3 days/week. States that he also has heart issues that are being monitored the limit his overall activity.    Limitations Standing;Walking;House hold activities    How long can you walk comfortably? increased difficulty with start/stop walking                [facility] PT Assessment - 02/04/21 0001       Assessment   Medical Diagnosis LBP without sciatic    Referring Provider (PT) Doree Albee, MD                           [facility] Adult PT Treatment/Exercise - 02/04/21 0001       Lumbar Exercises: Aerobic   Stationary Bike 5 minutes at level 2 RPM above 55      Lumbar Exercises: Standing   Shoulder Extension AROM;Both;15 reps   5" holds   Theraband Level (Shoulder Extension) Level 3 (Green)      Lumbar Exercises: Seated   Other Seated Lumbar Exercises row with feet on box- 2 weight plates 1x15 --> then x3 of 2 minutes bouts    Other Seated Lumbar Exercises seated flexion stretch - in mock row machine position  2x15 5" holds                    PT Education - 02/04/21 1109     Education Details on transitioning to gym to use bike/row machine for aerobic activity and overall conditioning. on HEP    Person(s) Educated Patient    Methods Explanation    Comprehension Verbalized understanding              PT Short Term Goals - 01/25/21 1137       PT SHORT TERM GOAL #1   Title Patient will be independent with HEP in order to improve functional outcomes.    Time 2    Period Weeks    Status New    Target Date 02/08/21      PT  SHORT TERM GOAL #2   Title Demonstrate improved gait efficiency as evidenced by ability to walk 300 ft before neurogenic claudication symptoms appear    Baseline 250 ft    Time 2    Period Weeks    Status New    Target Date 02/08/21  PT Long Term Goals - 01/25/21 1138       PT LONG TERM GOAL #1   Title Patient will report at least 25% improvement in symptoms for improved quality of life.    Time 4    Period Weeks    Status New    Target Date 02/22/21      PT LONG TERM GOAL #2   Title Demo core/trunk strength to 3/5 to facilitate improved stability and decrease pain when lifting/carrying items    Baseline 2+/5 flexion/extension    Time 4    Period Weeks    Status New    Target Date 02/22/21      PT LONG TERM GOAL #3   Title Demonstrate improved gait efficiency as evidenced by 400 ft distance during 2MWT    Baseline 250 ft    Time 4    Period Weeks    Status New    Target Date 02/22/21                   Plan - 02/04/21 1046     Clinical Impression Statement Session focused on aerobic activity. Mimicked row machine and broke down different components of the exercise as patient interested in performing row machine exercise at home. Able to perform all exercises without pain but overall weakness/deconditioning noted with activities. No pain noted during session only fatigue. Will continue with current POC as tolerated. Provided patient wtih green theraband for home compliance.    Personal Factors and Comorbidities Age;Comorbidity 2;Time since onset of injury/illness/exacerbation    Comorbidities LSS, cardiac issues    Examination-Activity Limitations Carry;Lift;Stand;Stairs;Squat;Locomotion Level    Examination-Participation Restrictions Art gallery manager;Yard Work    Stability/Clinical Decision Making Stable/Uncomplicated    Rehab Potential Good    PT Frequency 1x / week    PT Duration 4 weeks    PT Treatment/Interventions DME  Instruction;Gait training;Stair training;Functional mobility training;Therapeutic activities;Therapeutic exercise;Balance training;Patient/family education;Neuromuscular re-education;Manual techniques;Passive range of motion    PT Next Visit Plan progress band exercises, core strength,    PT Home Exercise Plan SKTC, DKTC, PPT, shoulder extension    Consulted and Agree with Plan of Care Patient             Patient will benefit from skilled therapeutic intervention in order to improve the following deficits and impairments:  Cardiopulmonary status limiting activity, Decreased activity tolerance, Decreased balance, Decreased strength, Decreased range of motion, Difficulty walking, Improper body mechanics, Pain  Visit Diagnosis: Chronic low back pain without sciatica, unspecified back pain laterality  Difficulty in walking, not elsewhere classified  Muscle weakness (generalized)     Problem List Patient Active Problem List   Diagnosis Date Noted   Unstable angina ([place]) 01/22/2021   Vitamin D deficiency 04/09/2020   Morton's neuroma of left foot 04/09/2020   Insomnia 09/26/2019   Prediabetes 06/23/2019   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   CVA (cerebral vascular accident) ([place]) 01/31/2019   Rectal bleeding 11/26/2017   Hemorrhoids 11/26/2017   History of colonic polyps 11/26/2017   PAD (peripheral artery disease) ([place]) 05/05/2017   Intermittent claudication ([place]) 05/05/2017   Hepatitis C virus infection resolved after antiviral drug therapy 05/05/2017   Cirrhosis of liver without ascites ([place]) 05/05/2017   Esophageal reflux 07/23/2015   Nicotine dependence, cigarettes, with other nicotine-induced disorders 07/23/2015   Hyperlipidemia 06/13/2015   Colon polyps 01/14/2015   Essential hypertension 04/12/2013   Coronary artery disease    Unspecified malignant neoplasm of skin of lower limb, including  hip 03/16/2013   11:25 AM, 02/04/21 Jerene Pitch, DPT Physical Therapy  with [place] [facility]  [phone] office   [place] [address] [place], [place], [address] Phone: [phone]   Fax:  [phone]  Name: Duane Adams MRN: [id number] Date of Birth: 1956/05/08

## 2021-02-11 ENCOUNTER — Other Ambulatory Visit: Payer: Self-pay | Admitting: Cardiology

## 2021-02-11 ENCOUNTER — Other Ambulatory Visit: Payer: Self-pay

## 2021-02-11 ENCOUNTER — Other Ambulatory Visit (INDEPENDENT_AMBULATORY_CARE_PROVIDER_SITE_OTHER): Payer: Self-pay | Admitting: Nurse Practitioner

## 2021-02-11 ENCOUNTER — Ambulatory Visit (HOSPITAL_COMMUNITY): Payer: Medicaid Other | Attending: Internal Medicine | Admitting: Physical Therapy

## 2021-02-11 DIAGNOSIS — G8929 Other chronic pain: Secondary | ICD-10-CM | POA: Diagnosis not present

## 2021-02-11 DIAGNOSIS — R262 Difficulty in walking, not elsewhere classified: Secondary | ICD-10-CM | POA: Insufficient documentation

## 2021-02-11 DIAGNOSIS — E782 Mixed hyperlipidemia: Secondary | ICD-10-CM

## 2021-02-11 DIAGNOSIS — M545 Low back pain, unspecified: Secondary | ICD-10-CM | POA: Insufficient documentation

## 2021-02-11 NOTE — Therapy (Signed)
[facility] [place], [place], [address] Phone: [phone]   Fax:  [phone]  Physical Therapy Treatment  Patient Details  Name: Duane Adams MRN: [id number] Date of Birth: [birth year] Referring Provider (PT): Doree Albee, MD   Encounter Date: 02/11/2021   PT End of Session - 02/11/21 1439     Visit Number 3    Number of Visits 4    Date for PT Re-Evaluation 02/22/21    Authorization Type [place] Medicaid [employer]    PT Start Time 1008    PT Stop Time [id number]    PT Time Calculation (min) 39 min    Activity Tolerance Patient tolerated treatment well    Behavior During Therapy [facility] for tasks assessed/performed             Past Medical History:  Diagnosis Date   Allergy    Anxiety    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 03/2013 Cath/PCI: LM nl, LAD 32m(2.5x20 Promus Premier DES), LCX min irregs, RCA dom, 90p(3.0x20 Promus Premier DES), EF 55-65%.   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 Needs colonoscopy in 1 year.    COLONOSCOPY WITH PROPOFOL N/A 12/19/2014   Dr. FOneida Alar six simple adenomas and 3 hyperplastic polyps. Had flat mid-transverse colon polyp [facility]   CORONARY ANGIOPLASTY WITH STENT PLACEMENT  04/11/2013   LAD &  RCA     DR COOPER   ESOPHAGOGASTRODUODENOSCOPY (EGD) WITH PROPOFOL N/A 12/19/2014   Dr. FOneida Alar  without varices, small hiatal hernia noted, moderate non-erosive gastritis and mild duodenitis.  POSITIVE H.PYLORI. Prescribed Prevpac.   EXCISION MORTON'S NEUROMA Left 08/15/2020   Procedure: EXCISION MORTON'S NEUROMA THIRD INTERSPACE LEFT FOOT;  Surgeon: PFelipa Furnace DPM;  [place]  Service: Podiatry;  Laterality: Left;   EXTRACORPOREAL SHOCK WAVE LITHOTRIPSY  2001   FLEXIBLE SIGMOIDOSCOPY N/A 12/23/2017   Procedure: FLEXIBLE SIGMOIDOSCOPY;  Surgeon: FDanie Binder MD;  Location: [facility];  Service: Endoscopy;  Laterality: N/A;  1:45pm   HAND RECONSTRUCTION Left    HEMORRHOID BANDING N/A 12/23/2017   Procedure: HEMORRHOID BANDING;  Surgeon: FDanie Binder MD;  Location: [facility];  Service: Endoscopy;  Laterality: N/A;  LEFT HEART CATH AND CORONARY ANGIOGRAPHY N/A 01/22/2021   Procedure: LEFT HEART CATH AND CORONARY ANGIOGRAPHY;  Surgeon: Martinique, Peter M, MD;  Location: [place] [facility] LAB;  Service: Cardiovascular;  Laterality: N/A;   LEFT HEART CATHETERIZATION WITH CORONARY ANGIOGRAM N/A 04/11/2013   Procedure: LEFT HEART CATHETERIZATION WITH CORONARY ANGIOGRAM;  Surgeon: Blane Ohara, MD;  Location: [facility] CATH LAB;  Service: Cardiovascular;  Laterality: N/A;   LEG TENDON SURGERY Right    POLYPECTOMY N/A 12/19/2014   Procedure: POLYPECTOMY;  Surgeon: Danie Binder, MD;  Location: [facility];  Service: Endoscopy;  Laterality: N/A;    There  were no vitals filed for this visit.   Subjective Assessment - 02/11/21 1440     Subjective pt states he tried to push his yard for 5 minutes but had to stop due to pain.  Pt reports pain remains the same as last visit.    Currently in Pain? Yes    Pain Score 2     Pain Location Buttocks    Pain Orientation Left                               [facility] Adult PT Treatment/Exercise - 02/11/21 0001       Exercises   Exercises Lumbar      Lumbar Exercises: Stretches   Single Knee to Chest Stretch Right;Left;2 reps;30 seconds      Lumbar Exercises: Aerobic   Stationary Bike 5 minutes at level 2 RPM above 55      Lumbar Exercises: Standing   Scapular Retraction AROM;15 reps;Theraband    Theraband Level (Scapular Retraction) Level 3 (Green)    Row AROM;15 reps;Theraband    Theraband Level (Row) Level 3 (Green)    Shoulder Extension AROM;15 reps    Theraband Level (Shoulder Extension) Level 3 (Green)      Lumbar Exercises: Supine   Ab Set 10 reps    Bridge 10 reps    Straight Leg Raise 10 reps      Lumbar Exercises: Sidelying   Clam 10 reps    Clam Limitations 5"    Hip Abduction 10 reps      Lumbar Exercises: Prone   Other Prone Lumbar Exercises heelsqueeze 10X5                      PT Short Term Goals - 02/11/21 1445       PT SHORT TERM GOAL #1   Title Patient will be independent with HEP in order to improve functional outcomes.    Time 2    Period Weeks    Status On-going    Target Date 02/08/21      PT SHORT TERM GOAL #2   Title Demonstrate improved gait efficiency as evidenced by ability to walk 300 ft before neurogenic claudication symptoms appear    Baseline 250 ft    Time 2    Period Weeks    Status On-going    Target Date 02/08/21               PT Long Term Goals - 02/11/21 1445       PT LONG TERM GOAL #1   Title Patient will report at least 25% improvement in symptoms for improved quality of life.    Time 4     Period Weeks    Status On-going      PT LONG TERM GOAL #2  Title Demo core/trunk strength to 3/5 to facilitate improved stability and decrease pain when lifting/carrying items    Baseline 2+/5 flexion/extension    Time 4    Period Weeks    Status On-going      PT LONG TERM GOAL #3   Title Demonstrate improved gait efficiency as evidenced by 400 ft distance during 2MWT    Baseline 250 ft    Time 4    Period Weeks    Status On-going                   Plan - 02/11/21 1047     Clinical Impression Statement Introduced core work this session.  Cues to not lift his head and for breathing technique with abdominal bracing.   Completed glute and LE strengthening as well in supine.  Noted weakness with substitution and correction needed.  Additional postural strengthening activities added with theraband.  Tendency to bend wrists and use arms rather than scapular/lumbar musculature.  Pt without difficulty getting up from and down to a supine position without using logroll technique.   Pt without any complaints or issues during session or at conclusion today.    Personal Factors and Comorbidities Age;Comorbidity 2;Time since onset of injury/illness/exacerbation    Comorbidities LSS, cardiac issues    Examination-Activity Limitations Carry;Lift;Stand;Stairs;Squat;Locomotion Level    Examination-Participation Restrictions Art gallery manager;Yard Work    Stability/Clinical Decision Making Stable/Uncomplicated    Rehab Potential Good    PT Frequency 1x / week    PT Duration 4 weeks    PT Treatment/Interventions DME Instruction;Gait training;Stair training;Functional mobility training;Therapeutic activities;Therapeutic exercise;Balance training;Patient/family education;Neuromuscular re-education;Manual techniques;Passive range of motion    PT Next Visit Plan continue to improve LE and postural strength as well as core stabitliy.    PT Home Exercise Plan SKTC, DKTC, PPT, shoulder  extension    Consulted and Agree with Plan of Care Patient             Patient will benefit from skilled therapeutic intervention in order to improve the following deficits and impairments:  Cardiopulmonary status limiting activity, Decreased activity tolerance, Decreased balance, Decreased strength, Decreased range of motion, Difficulty walking, Improper body mechanics, Pain  Visit Diagnosis: Chronic low back pain without sciatica, unspecified back pain laterality  Difficulty in walking, not elsewhere classified     Problem List Patient Active Problem List   Diagnosis Date Noted   Unstable angina ([place]) 01/22/2021   Vitamin D deficiency 04/09/2020   Morton's neuroma of left foot 04/09/2020   Insomnia 09/26/2019   Prediabetes 06/23/2019   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   CVA (cerebral vascular accident) ([place]) 01/31/2019   Rectal bleeding 11/26/2017   Hemorrhoids 11/26/2017   History of colonic polyps 11/26/2017   PAD (peripheral artery disease) ([place]) 05/05/2017   Intermittent claudication ([place]) 05/05/2017   Hepatitis C virus infection resolved after antiviral drug therapy 05/05/2017   Cirrhosis of liver without ascites ([place]) 05/05/2017   Esophageal reflux 07/23/2015   Nicotine dependence, cigarettes, with other nicotine-induced disorders 07/23/2015   Hyperlipidemia 06/13/2015   Colon polyps 01/14/2015   Essential hypertension 04/12/2013   Coronary artery disease    Unspecified malignant neoplasm of skin of lower limb, including hip 03/16/2013   Teena Irani, PTA/CLT [id number]  Teena Irani 02/11/2021, 2:46 PM  [facility] [address] [place], [place], [address] Phone: [phone]   Fax:  [phone]  Name: Duane Adams MRN: [id number] Date of Birth: 1955/08/29

## 2021-02-13 NOTE — Progress Notes (Signed)
Cardiology Office Note    Date:  02/18/2021   ID:  MYTHIAS KHALID, DOB 01-14-1956, MRN [id number]   PCP:  Doree Albee, MD   [place]  Cardiologist:  Carlyle Dolly, MD   Advanced Practice Provider:  No care team member to display Electrophysiologist:  None   [phone]   Chief Complaint  Patient presents with   Chest Pain   Palpitations     History of Present Illness:  Duane Adams is a 65 y.o. male  with history of CAD (s/p DES to LAD and DES to RCA in 03/2013, patent stents by repeat cath in 08/2016), HTN, HLD, PAD (with known left subclavian stenosis) and tobacco use.  Patient underwent cardiac catheterization for chest pain 01/22/2021 and was found to have patent LAD and RCA stents nonobstructive disease elsewhere normal LVEF 55 to 65%.  Consider noncardiac causes of chest pain.  Patient comes in for f/u. Doesn't feel well. Was working in the yard today and heart racing and chest tightness. Smoking up to 5 cigarettes daily. Had some margaritas last night but usually on drinks homemade wine once a month. BP up today. 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 03/2013 Cath/PCI: LM nl, LAD 29m(2.5x20 Promus Premier DES), LCX min irregs, RCA dom, 90p(3.0x20 Promus Premier DES), EF 55-65%.   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 Needs colonoscopy in 1 year.    COLONOSCOPY WITH PROPOFOL N/A 12/19/2014   Dr. FOneida Alar six simple adenomas and 3 hyperplastic polyps. Had flat mid-transverse colon polyp [facility]   CORONARY ANGIOPLASTY WITH STENT PLACEMENT  04/11/2013   LAD &  RCA     DR COOPER   ESOPHAGOGASTRODUODENOSCOPY (EGD) WITH PROPOFOL N/A 12/19/2014   Dr. FOneida Alar  without varices, small hiatal hernia noted, moderate non-erosive gastritis and mild duodenitis.  POSITIVE H.PYLORI. Prescribed Prevpac.   EXCISION MORTON'S NEUROMA Left 08/15/2020   Procedure: EXCISION MORTON'S NEUROMA THIRD INTERSPACE LEFT FOOT;  Surgeon: PFelipa Furnace DPM;  [place]  Service: Podiatry;  Laterality: Left;   EXTRACORPOREAL SHOCK WAVE LITHOTRIPSY  2001   FLEXIBLE SIGMOIDOSCOPY N/A 12/23/2017   Procedure: FLEXIBLE SIGMOIDOSCOPY;  Surgeon: Danie Binder, MD;  [facility];  Service: Endoscopy;  Laterality: N/A;  1:45pm   HAND RECONSTRUCTION Left    HEMORRHOID BANDING N/A 12/23/2017   Procedure: HEMORRHOID BANDING;  Surgeon: Danie Binder, MD;  [facility];  Service: Endoscopy;  Laterality: N/A;   LEFT HEART CATH AND CORONARY ANGIOGRAPHY N/A 01/22/2021   Procedure: LEFT HEART CATH AND CORONARY ANGIOGRAPHY;  Surgeon: Martinique, Peter M, MD;  Location: [place] CV LAB;  Service: Cardiovascular;  Laterality: N/A;    LEFT HEART CATHETERIZATION WITH CORONARY ANGIOGRAM N/A 04/11/2013   Procedure: LEFT HEART CATHETERIZATION WITH CORONARY ANGIOGRAM;  Surgeon: Blane Ohara, MD;  Location: [facility] CATH LAB;  Service: Cardiovascular;  Laterality: N/A;   LEG TENDON SURGERY Right    POLYPECTOMY N/A 12/19/2014   Procedure: POLYPECTOMY;  Surgeon: Danie Binder, MD;  Location: [facility];  Service: Endoscopy;  Laterality: N/A;    Current Medications: Current Meds  Medication Sig   amLODipine (NORVASC) 5 MG tablet TAKE ONE TABLET BY MOUTH ONCE DAILY.   aspirin EC 81 MG tablet Take 81 mg by mouth at bedtime.    atorvastatin (LIPITOR) 80 MG tablet Take 1 tablet (80 mg total) by mouth daily.   chlorthalidone (HYGROTON) 25 MG tablet TAKE 1 TABLET BY MOUTH DAILY.   Cholecalciferol (VITAMIN D3) 2000 units TABS Take 4,000 Units by mouth 2 (two) times daily.   cloNIDine (CATAPRES) 0.2 MG tablet TAKE (1) TABLET BY MOUTH TWICE DAILY.   clopidogrel (PLAVIX) 75 MG tablet TAKE 1 TABLET BY MOUTH ONCE DAILY.   isosorbide mononitrate (IMDUR) 30 MG 24 hr tablet Take 1 tablet (30 mg total) by mouth daily.   metoprolol succinate (TOPROL-XL) 100 MG 24 hr tablet TAKE ONE TABLET BY MOUTH ONCE DAILY.   metoprolol succinate (TOPROL-XL) 50 MG 24 hr tablet Take 1 tablet (50 mg total) by mouth daily. Take with or immediately following a meal.   Multiple Vitamin (MULTIVITAMIN WITH MINERALS) TABS tablet Take 1 tablet by mouth daily.   nitroGLYCERIN (NITROSTAT) 0.4 MG SL tablet Place 1 tablet (0.4 mg total) under the tongue every 5 (five) minutes as needed for chest pain.   Omega 3 1200 MG CAPS Take 2,400 mg by mouth daily.   pantoprazole (PROTONIX) 40 MG tablet TAKE ONE TABLET BY MOUTH ONCE DAILY.   potassium chloride SA (KLOR-CON) 20 MEQ tablet TAKE ONE TABLET BY MOUTH ONCE DAILY.   [DISCONTINUED] isosorbide mononitrate (IMDUR) 30 MG 24 hr tablet TAKE 1/2 TABLET BY MOUTH ONCE DAILY.     Allergies:   Lisinopril, Crestor [rosuvastatin], Chantix  [varenicline], and Neosporin [neomycin-bacitracin zn-polymyx]   Social History   Socioeconomic History   Marital status: Soil scientist    Spouse name: Tisha   Number of children: 2   Years of education: 14   Highest education level: Not on file  Occupational History   Occupation: disabled    Comment: heart  Tobacco Use   Smoking status: Light Smoker    Packs/day: 0.50    Years: 40.00    Pack years: 20.00    Types: Cigarettes    Start date: 07/14/1968   Smokeless tobacco: Never   Tobacco comments:    5-6 Cigarettes per day  Vaping Use   Vaping Use: Never used  Substance and Sexual Activity   Alcohol use: Yes    Alcohol/week: 0.0 standard drinks    Comment: As  of 11/26/17: social - 1-2 beers a sitting x 1-2 times a month   Drug use: Not Currently    Types: Cocaine, Marijuana    Comment: cocaine marijuana and acid last used 1984   Sexual activity: Not Currently  Other Topics Concern   Not on file  Social History Narrative   Disabled from heart disease   Previously worked in Scientist, research (medical) with Neurosurgeon for 28 years   Leisure: Teaching laboratory technician   Social Determinants of Radio broadcast assistant Strain: Not on file  Food Insecurity: Not on file  Transportation Needs: Not on file  Physical Activity: Not on file  Stress: Not on file  Social Connections: Not on file     Family History:  The patient's  family history includes Alcohol abuse in his brother; Aneurysm in his mother; Diabetes in his paternal aunt; Heart disease in his brother and father; Hypertension in his brother, father, and mother; Stroke in his mother.   ROS:   Please see the history of present illness.    ROS All other systems reviewed and are negative.   PHYSICAL EXAM:   VS:  BP (!) 160/98   Pulse (!) 110   Ht '5\' 8"'$  (1.727 m)   Wt 185 lb (83.9 kg)   SpO2 97%   BMI 28.13 kg/m   Physical Exam  GEN: Well nourished, well developed, in no acute distress  Neck: no JVD, carotid bruits, or  masses Cardiac:RRR; no murmurs, rubs, or gallops  Respiratory:  clear to auscultation bilaterally, normal work of breathing GI: soft, nontender, nondistended, + BS Ext: without cyanosis, clubbing, or edema, Good distal pulses bilaterally Neuro:  Alert and Oriented x 3 Psych: euthymic mood, full affect  Wt Readings from Last 3 Encounters:  02/18/21 185 lb (83.9 kg)  01/22/21 190 lb (86.2 kg)  01/18/21 198 lb (89.8 kg)      Studies/Labs Reviewed:   EKG:  EKG is  ordered today.  The ekg ordered today demonstrates sinus tachycardia with nonspecific ST changes laterally, no acute change  Recent Labs: 12/17/2020: ALT 42 01/21/2021: Platelets 191 01/22/2021: BUN 12; Creatinine, Ser 0.70; Hemoglobin 12.9; Potassium 3.3; Sodium 140   Lipid Panel    Component Value Date/Time   CHOL 225 (H) 12/17/2020 1458   TRIG 931 (H) 12/17/2020 1458   HDL 46 12/17/2020 1458   CHOLHDL 4.9 12/17/2020 1458   VLDL UNABLE TO CALCULATE IF TRIGLYCERIDE OVER 400 mg/dL 02/01/2019 0425   LDLCALC  12/17/2020 1458     Comment:     . LDL cholesterol not calculated. Triglyceride levels greater than 400 mg/dL invalidate calculated LDL results. . Reference range: <100 . Desirable range <100 mg/dL for primary prevention;   <70 mg/dL for patients with CHD or diabetic patients  with > or = 2 CHD risk factors. Marland Kitchen LDL-C is now calculated using the Martin-Hopkins  calculation, which is a validated novel method providing  better accuracy than the Friedewald equation in the  estimation of LDL-C.  Cresenciano Genre et al. Annamaria Helling. [id number]): 2061-2068  (http://education.QuestDiagnostics.com/faq/FAQ164)    LDLDIRECT 100.2 (H) 02/01/2019 0425    Additional studies/ records that were reviewed today include:  Cardiac cath 01/22/21 Previously placed Prox LAD to Mid LAD stent (unknown type) is widely patent. 1st Diag lesion is 35% stenosed. Mid LAD lesion is 25% stenosed. Ost Cx to Prox Cx lesion is 40% stenosed. Ost RCA to  Prox RCA lesion is 10% stenosed. Prox RCA lesion is 30% stenosed. The  left ventricular systolic function is normal. LV end diastolic pressure is normal. The left ventricular ejection fraction is 55-65% by visual estimate.   1. Nonobstructive CAD. Excellent patency of stents in the LAD and RCA 2. Normal LV function 3. Normal LV EDP   Plan: continue medical therapy. Consider other noncardiac causes for symptoms.     Risk Assessment/Calculations:         ASSESSMENT:    1. Coronary artery disease involving native coronary artery of native heart without angina pectoris   2. Essential hypertension   3. Hyperlipidemia, unspecified hyperlipidemia type   4. PAD (peripheral artery disease) ([place])   5. Tobacco abuse   6. Chronic obstructive pulmonary disease, unspecified COPD type ([place])      PLAN:  In order of problems listed above:    CAD Stent LAD & RCA 03/2013 patent on cath 08/2016 & 01/22/21, nonobstructive disease elsewhere.Chest pain and sinus tachycardia today after working in the yard. Has to stop every 15 min. EKG unchanged, will increase toprol xl 150 mg once daily, Imdur 30 mg once daily.  Hypertension BP up today-increase Imdur and torpol-keep track of BP and pulse at home.   HLD-on lipitor previously followed by Dr. Anastasio Champion LDL pending from 12/17/20, trig 931-A1C 6.1. may benefit from [place].  PAD with known left subclavian stenosis  Tobacco use-smoker for over 27 yrs, wheezing , DOE, refer to pulmonary  Shared Decision Making/Informed Consent        Medication Adjustments/Labs and Tests Ordered: Current medicines are reviewed at length with the patient today.  Concerns regarding medicines are outlined above.  Medication changes, Labs and Tests ordered today are listed in the Patient Instructions below. Patient Instructions  Medication Instructions:   INCREASE Imdur to 30 mg daily   INCREASE Toprol to 150 mg daily    *If you need a refill on your cardiac  medications before your next appointment, please call your pharmacy*   Lab Work: None today  If you have labs (blood work) drawn today and your tests are completely normal, you will receive your results only by: [place] (if you have MyChart) OR A paper copy in the mail If you have any lab test that is abnormal or we need to change your treatment, we will call you to review the results.   Testing/Procedures: None today    Follow-Up: At [facility], you and your health needs are our priority.  As part of our continuing mission to provide you with exceptional heart care, we have created designated Provider Care Teams.  These Care Teams include your primary Cardiologist (physician) and Advanced Practice Providers (APPs -  Physician Assistants and Nurse Practitioners) who all work together to provide you with the care you need, when you need it.  We recommend signing up for the patient portal called "MyChart".  Sign up information is provided on this After Visit Summary.  MyChart is used to connect with patients for Virtual Visits (Telemedicine).  Patients are able to view lab/test results, encounter notes, upcoming appointments, etc.  Non-urgent messages can be sent to your provider as well.   To learn more about what you can do with MyChart, go to [email or web address].    Your next appointment:  Next available with Dr.Branch   Other Instructions  Take heart rate and blood pressure 2 hours after medications and record daily     We have placed a referral for Pulmonary Consult. They will call you to schedule an appointment   Signed,  Ermalinda Barrios, PA-C  02/18/2021 1:31 PM    [facility] [place], [place],   [address] Phone: [phone]; Fax: [phone]

## 2021-02-16 ENCOUNTER — Other Ambulatory Visit: Payer: Self-pay | Admitting: Cardiology

## 2021-02-18 ENCOUNTER — Encounter: Payer: Self-pay | Admitting: Physician Assistant

## 2021-02-18 ENCOUNTER — Other Ambulatory Visit: Payer: Self-pay

## 2021-02-18 ENCOUNTER — Ambulatory Visit (HOSPITAL_COMMUNITY): Payer: Medicaid Other

## 2021-02-18 ENCOUNTER — Ambulatory Visit: Payer: Medicaid Other | Admitting: Physician Assistant

## 2021-02-18 VITALS — BP 160/98 | HR 110 | Ht 68.0 in | Wt 185.0 lb

## 2021-02-18 DIAGNOSIS — I10 Essential (primary) hypertension: Secondary | ICD-10-CM | POA: Diagnosis not present

## 2021-02-18 DIAGNOSIS — J449 Chronic obstructive pulmonary disease, unspecified: Secondary | ICD-10-CM

## 2021-02-18 DIAGNOSIS — Z720 Tobacco use: Secondary | ICD-10-CM

## 2021-02-18 DIAGNOSIS — E785 Hyperlipidemia, unspecified: Secondary | ICD-10-CM | POA: Diagnosis not present

## 2021-02-18 DIAGNOSIS — I739 Peripheral vascular disease, unspecified: Secondary | ICD-10-CM | POA: Diagnosis not present

## 2021-02-18 DIAGNOSIS — I2510 Atherosclerotic heart disease of native coronary artery without angina pectoris: Secondary | ICD-10-CM

## 2021-02-18 MED ORDER — ISOSORBIDE MONONITRATE ER 30 MG PO TB24: 30.0000 mg | ORAL_TABLET | Freq: Every day | ORAL | 3 refills | Status: DC

## 2021-02-18 MED ORDER — METOPROLOL SUCCINATE ER 50 MG PO TB24: 50.0000 mg | ORAL_TABLET | Freq: Every day | ORAL | 3 refills | Status: DC

## 2021-02-18 NOTE — Patient Instructions (Signed)
Medication Instructions:   INCREASE Imdur to 30 mg daily   INCREASE Toprol to 150 mg daily    *If you need a refill on your cardiac medications before your next appointment, please call your pharmacy*   Lab Work: None today  If you have labs (blood work) drawn today and your tests are completely normal, you will receive your results only by: [place] (if you have MyChart) OR A paper copy in the mail If you have any lab test that is abnormal or we need to change your treatment, we will call you to review the results.   Testing/Procedures: None today    Follow-Up: At [facility], you and your health needs are our priority.  As part of our continuing mission to provide you with exceptional heart care, we have created designated Provider Care Teams.  These Care Teams include your primary Cardiologist (physician) and Advanced Practice Providers (APPs -  Physician Assistants and Nurse Practitioners) who all work together to provide you with the care you need, when you need it.  We recommend signing up for the patient portal called "MyChart".  Sign up information is provided on this After Visit Summary.  MyChart is used to connect with patients for Virtual Visits (Telemedicine).  Patients are able to view lab/test results, encounter notes, upcoming appointments, etc.  Non-urgent messages can be sent to your provider as well.   To learn more about what you can do with MyChart, go to [email or web address].    Your next appointment:  Next available with Dr.Branch   Other Instructions  Take heart rate and blood pressure 2 hours after medications and record daily     We have placed a referral for Pulmonary Consult. They will call you to schedule an appointment

## 2021-02-20 ENCOUNTER — Other Ambulatory Visit: Payer: Self-pay

## 2021-02-20 ENCOUNTER — Ambulatory Visit: Payer: Medicaid Other | Admitting: Cardiology

## 2021-02-20 ENCOUNTER — Encounter: Payer: Self-pay | Admitting: Cardiology

## 2021-02-20 VITALS — BP 156/74 | HR 82 | Ht 69.0 in | Wt 187.0 lb

## 2021-02-20 DIAGNOSIS — R059 Cough, unspecified: Secondary | ICD-10-CM | POA: Diagnosis not present

## 2021-02-20 DIAGNOSIS — I25118 Atherosclerotic heart disease of native coronary artery with other forms of angina pectoris: Secondary | ICD-10-CM | POA: Diagnosis not present

## 2021-02-20 DIAGNOSIS — R0602 Shortness of breath: Secondary | ICD-10-CM | POA: Diagnosis not present

## 2021-02-20 MED ORDER — AMLODIPINE BESYLATE 10 MG PO TABS: 10.0000 mg | ORAL_TABLET | Freq: Every day | ORAL | 3 refills | Status: DC

## 2021-02-20 MED ORDER — CLONIDINE HCL 0.1 MG PO TABS: 0.1000 mg | ORAL_TABLET | Freq: Two times a day (BID) | ORAL | 1 refills | Status: DC

## 2021-02-20 NOTE — Patient Instructions (Signed)
Medication Instructions:  Your physician has recommended you make the following change in your medication:  DECREASE Clonidine to 0.1 mg tablets twice daily INCREASE Norvasc to 10 mg tablets once daily  *If you need a refill on your cardiac medications before your next appointment, please call your pharmacy*   Lab Work: None If you have labs (blood work) drawn today and your tests are completely normal, you will receive your results only by: [place] (if you have MyChart) OR A paper copy in the mail If you have any lab test that is abnormal or we need to change your treatment, we will call you to review the results.   Testing/Procedures: None   Follow-Up: At [facility], you and your health needs are our priority.  As part of our continuing mission to provide you with exceptional heart care, we have created designated Provider Care Teams.  These Care Teams include your primary Cardiologist (physician) and Advanced Practice Providers (APPs -  Physician Assistants and Nurse Practitioners) who all work together to provide you with the care you need, when you need it.  We recommend signing up for the patient portal called "MyChart".  Sign up information is provided on this After Visit Summary.  MyChart is used to connect with patients for Virtual Visits (Telemedicine).  Patients are able to view lab/test results, encounter notes, upcoming appointments, etc.  Non-urgent messages can be sent to your provider as well.   To learn more about what you can do with MyChart, go to [email or web address].    Your next appointment:   1 month(s)  The format for your next appointment:   In Person  Provider:   Bernerd Pho, PA-C   Other Instructions You have been referred to Pulmonology. They will call you for your first appointment.

## 2021-02-20 NOTE — Progress Notes (Signed)
Clinical Summary [name] is a 65 y.o.maleseen today for follow up of the following medical problems.This is a focused visit on history of CAD and recent chest pains.      1. CAD   -  cath 04/11/13 showed significant LAD and RCA disease, s/p DES to both  - echo 03/2013 with normal LVEF   - compliant w/ meds including ASA and plavix   - seen at [facility] for chest pain 08/2016. LHC at that time showed only mild CAD along with patent stents.   - previously lowered imdur to 9m daily due to headaches, increased Toprol to 746mdaily. Has had some increased fatigue, poor sleep. No recent chest pain     - occasional chest pains midchest, Dull pain, flipping kind of filling. Lasts just a few seconds.   - compliant with meds    01/2021 reported progressive chest pain and DOE - 01/2021 cath: LM normal, LAD patent stent and mid 25%, D1 35%, LCX 40%, ostial RCA 10% and prox 30% -55-65%  - still with chest pains. 2 days ago had been doing yard work 10-15 minutes, squeezing feeling mid chest with SOB.  - no relation to food or eating.  - some recent fatigue       2. Productive cough - white sputum x 6 months - +smoker  Past Medical History:  Diagnosis Date   Allergy    Anxiety    [place]   Concussion several yrs ago   no residual   Coronary artery disease    a. 03/2013 Cath/PCI: LM nl, LAD 8065m.5x20 Promus Premier DES), LCX min irregs, RCA dom, 90p(3.0x20 Promus Premier DES), EF 55-65%.   CVA (cerebral vascular accident) ([place]/20/2020   slight memory issues   Essential hypertension, benign    GERD (gastroesophageal reflux disease)    Helicobacter pylori gastritis JUN 2016 EGD/Bx   PREVPAK BID FOR 14 DAYS   Hepatitis    Hepatitis C treated  2011   Hiatal hernia    History of kidney stones    Mixed hyperlipidemia    Neuroma    Prediabetes 06/23/2019   Skin cancer    Substance abuse ([place]  alcoholic quit [id number] abuse       Allergies  Allergen Reactions   Lisinopril Swelling    Swelling neck   Crestor [Rosuvastatin] Other (See Comments)    Severe muscle aches   Chantix [Varenicline] Other (See Comments)    Abdominal pain   Neosporin [Neomycin-Bacitracin Zn-Polymyx] Rash    blistered     Current Outpatient Medications  Medication Sig Dispense Refill   amLODipine (NORVASC) 5 MG tablet TAKE ONE TABLET BY MOUTH ONCE DAILY. 90 tablet 3   aspirin EC 81 MG tablet Take 81 mg by mouth at bedtime.      atorvastatin (LIPITOR) 80 MG tablet Take 1 tablet (80 mg total) by mouth daily. 90 tablet 3   chlorthalidone (HYGROTON) 25 MG tablet TAKE 1 TABLET BY MOUTH DAILY. 30 tablet 6   Cholecalciferol (VITAMIN D3) 2000 units TABS Take 4,000 Units by mouth 2 (two) times daily.     cloNIDine (CATAPRES) 0.2 MG tablet TAKE (1) TABLET BY MOUTH TWICE DAILY. 180 tablet 3   clopidogrel (PLAVIX) 75 MG tablet TAKE 1 TABLET BY MOUTH ONCE DAILY. 30 tablet 6   isosorbide mononitrate (IMDUR) 30 MG 24 hr tablet Take 1 tablet (30 mg total) by mouth daily.  90 tablet 3   metoprolol succinate (TOPROL-XL) 100 MG 24 hr tablet TAKE ONE TABLET BY MOUTH ONCE DAILY. 30 tablet 0   metoprolol succinate (TOPROL-XL) 50 MG 24 hr tablet Take 1 tablet (50 mg total) by mouth daily. Take with or immediately following a meal. 90 tablet 3   Multiple Vitamin (MULTIVITAMIN WITH MINERALS) TABS tablet Take 1 tablet by mouth daily.     nitroGLYCERIN (NITROSTAT) 0.4 MG SL tablet Place 1 tablet (0.4 mg total) under the tongue every 5 (five) minutes as needed for chest pain. 25 tablet 3   Omega 3 1200 MG CAPS Take 2,400 mg by mouth daily.     pantoprazole (PROTONIX) 40 MG tablet TAKE ONE TABLET BY MOUTH ONCE DAILY. 90 tablet 0   potassium chloride SA (KLOR-CON) 20 MEQ tablet TAKE ONE TABLET BY MOUTH ONCE DAILY. 90 tablet 3   No current facility-administered medications for this visit.     Past Surgical History:  Procedure Laterality Date   ABDOMINAL  AORTOGRAM W/LOWER EXTREMITY N/A 06/15/2017   Procedure: ABDOMINAL AORTOGRAM W/LOWER EXTREMITY;  Surgeon: Angelia Mould, MD;  Location: [place] [facility];  Service: Cardiovascular;  Laterality: N/A;  Bilateral   BIOPSY N/A 12/19/2014   Procedure: BIOPSY;  Surgeon: Danie Binder, MD;  Location: [facility];  Service: Endoscopy;  Laterality: N/A;   COLONOSCOPY  June 2016   [facility]: with endoscopic mucosal resection. 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 Needs colonoscopy in 1 year.    COLONOSCOPY WITH PROPOFOL N/A 12/19/2014   Dr. Oneida Alar: six simple adenomas and 3 hyperplastic polyps. Had flat mid-transverse colon polyp and referred to [facility] for endoscopic mucosal resection, which is scheduled for July 22   CORONARY ANGIOPLASTY WITH STENT PLACEMENT  04/11/2013   LAD &  RCA     DR COOPER   ESOPHAGOGASTRODUODENOSCOPY (EGD) WITH PROPOFOL N/A 12/19/2014   Dr. Oneida Alar:  without varices, small hiatal hernia noted, moderate non-erosive gastritis and mild duodenitis.  POSITIVE H.PYLORI. Prescribed Prevpac.   EXCISION MORTON'S NEUROMA Left 08/15/2020   Procedure: EXCISION MORTON'S NEUROMA THIRD INTERSPACE LEFT FOOT;  Surgeon: [name], DPM;  Location: [place];  Service: Podiatry;  Laterality: Left;   EXTRACORPOREAL SHOCK WAVE LITHOTRIPSY  2001   FLEXIBLE SIGMOIDOSCOPY N/A 12/23/2017   Procedure: FLEXIBLE SIGMOIDOSCOPY;  Surgeon: Danie Binder, MD;  Location: [facility];  Service: Endoscopy;  Laterality: N/A;  1:45pm   HAND RECONSTRUCTION Left    HEMORRHOID BANDING N/A 12/23/2017   Procedure: HEMORRHOID BANDING;  Surgeon: Danie Binder, MD;  Location: [facility];  Service: Endoscopy;  Laterality: N/A;   LEFT HEART CATH AND CORONARY ANGIOGRAPHY N/A 01/22/2021   Procedure: LEFT HEART CATH AND CORONARY ANGIOGRAPHY;  Surgeon: Martinique, Peter M, MD;  Location: [place] [facility];  Service: Cardiovascular;  Laterality: N/A;   LEFT HEART CATHETERIZATION WITH CORONARY  ANGIOGRAM N/A 04/11/2013   Procedure: LEFT HEART CATHETERIZATION WITH CORONARY ANGIOGRAM;  Surgeon: Blane Ohara, MD;  [facility]: [facility];  Service: Cardiovascular;  Laterality: N/A;   LEG TENDON SURGERY Right    POLYPECTOMY N/A 12/19/2014   Procedure: POLYPECTOMY;  Surgeon: Danie Binder, MD;  Location: [facility];  Service: Endoscopy;  Laterality: N/A;     Allergies  Allergen Reactions   Lisinopril Swelling    Swelling neck   Crestor [Rosuvastatin] Other (See Comments)    Severe muscle aches   Chantix [Varenicline] Other (See Comments)    Abdominal  pain   Neosporin [Neomycin-Bacitracin Zn-Polymyx] Rash    blistered      Family History  Problem Relation Age of Onset   Stroke Mother    Aneurysm Mother    Hypertension Mother    Heart disease Father    Hypertension Father    Heart disease Brother    Hypertension Brother    Alcohol abuse Brother    Diabetes Paternal Aunt    Colon cancer Neg Hx      Social History Mr. Savitz reports that he has been smoking cigarettes. He started smoking about 52 years ago. He has a 20.00 pack-year smoking history. He has never used smokeless tobacco. Mr. Mastrangelo reports current alcohol use.   Review of Systems CONSTITUTIONAL: No weight loss, fever, chills, weakness or fatigue.  HEENT: Eyes: No visual loss, blurred vision, double vision or yellow sclerae.No hearing loss, sneezing, congestion, runny nose or sore throat.  SKIN: No rash or itching.  CARDIOVASCULAR: per hpi RESPIRATORY: No shortness of breath, cough or sputum.  GASTROINTESTINAL: No anorexia, nausea, vomiting or diarrhea. No abdominal pain or blood.  GENITOURINARY: No burning on urination, no polyuria NEUROLOGICAL: No headache, dizziness, syncope, paralysis, ataxia, numbness or tingling in the extremities. No change in bowel or bladder control.  MUSCULOSKELETAL: No muscle, back pain, joint pain or stiffness.  LYMPHATICS: No enlarged nodes. No history of splenectomy.   PSYCHIATRIC: No history of depression or anxiety.  ENDOCRINOLOGIC: No reports of sweating, cold or heat intolerance. No polyuria or polydipsia.  Marland Kitchen   Physical Examination Today's Vitals   02/20/21 0818  BP: (!) 156/74  Pulse: 82  SpO2: 97%  Weight: 187 lb (84.8 kg)  Height: '5\' 9"'  (1.753 m)   Body mass index is 27.62 kg/m.  Gen: resting comfortably, no acute distress HEENT: no scleral icterus, pupils equal round and reactive, no palptable cervical adenopathy,  CV: RRR, no m/r/g no jvd Resp: faint bilaterla wheezing GI: abdomen is soft, non-tender, non-distended, normal bowel sounds, no hepatosplenomegaly MSK: extremities are warm, no edema.  Skin: warm, no rash Neuro:  no focal deficits Psych: appropriate affect   Diagnostic Studies  04/11/13 Cath   PROCEDURAL FINDINGS   Hemodynamics:   AO 178/93   LV 177/18   Coronary angiography:   Coronary dominance: right   Left mainstem: Widely patent without obstructive disease   Left anterior descending (LAD): severe mid-vessel stenosis (80%) between the first and second septal perforators. The vessel reaches the LV apex and there is no other significant disease noted. Large D1 is patent.   Left circumflex (LCx): small vessel, supplies 2 OM branches. Mild irregularity noted.   Right coronary artery (RCA): large, dominant vessel. There is severe 90% proximal stenosis. Irregularity in the mid-vessel without significant stenosis. The PDA and PLA branches are both large without significant disease.   Left ventriculography: Left ventricular systolic function is normal, LVEF is estimated at 55-65%, there is no significant mitral regurgitation   PCI Note: Following the diagnostic procedure, the decision was made to proceed with PCI. The patient was loaded with plavix 600 mg. Weight-based bivalirudin was given for anticoagulation. I planned on treating the LAD and RCA, both of which have high-grade disease. Once a therapeutic ACT was achieved,  a 6 [place] XB-LAD guide catheter was inserted. A cougar coronary guidewire was used to cross the lesion. The lesion was predilated with a 2.0 mm balloon. The lesion was then stented with a 2.5x20 mm Promus Premier drug-eluting stent. The stent was postdilated with  a 2.75 mm noncompliant balloon. Following PCI, there was 0% residual stenosis and TIMI-3 flow. Attention was then turned to the RCA. A JR-4 guide was used. The same cougar guidewire was used to cross the lesion. The lesion was dilated with a 2.0 mm balloon and stented with a 3.0x20 mm Promus Premier DES. The stent was post-dilated to 18 atm with a 3.25 mm Reeds balloon. Final angiography confirmed an excellent result. The patient tolerated the procedure well. There were no immediate procedural complications. A TR band was used for radial hemostasis. The patient was transferred to the post catheterization recovery area for further monitoring.   PCI Data:   Lesion 1   Vessel - LAD/Segment - mid   Percent Stenosis (pre) 80   TIMI-flow 3   Stent 2.5x20 mm Promus Premier DES   Percent Stenosis (post) 0   TIMI-flow (post) 3   Lesion 2   Vessel - RCA/Segment - prox   Percent Stenosis (pre) 90   TIMI-flow 3   Stent 3.0x20 mm Promus Premier DES   Percent Stenosis (post) 0   TIMI-flow (post) 3   Final Conclusions:   Severe 2 vessel CAD with successful PCI of the LAD and RCA   Normal LV function   Recommendations:   ASA and plavix for at least 12 months.     04/04/13 Echo   LVEF 60-65%, no WMA, grade I diastolic dysfunction       10/2014 Exercise Nuclear Stress IMPRESSION: 1. No reversible ischemia or infarction. Submaximal stress test, thus diminishing the ability of this test to detect ischemia.   2. Normal left ventricular wall motion.   3. Left ventricular ejection fraction 78%   4. Low-risk stress test findings*.  Consider Lexiscan in the future.     08/2016 [facility] cath Angiography: LVgram: not performed LCA: LAD stent patent,  mild luminal irregularites, no significant obstructive disease RCA: proximal stent patent, large R dominant RCA,mild luminal irregularities in distal Cathlin Buchan  Impression:   Mild CAD with Patent Stents RCA and LAD Normal resting LVedp (14 mm)  Plan:  Follow-up with referring physician. Echocardiogram suggested to evaluate possible HFrEF as cause for symptoms   Assessment and Plan  1. CAD with stable angina -recent exertoinal chest pain and DOE - cath showed no significant disease - ongoing symptoms. Perhaps microvascular disease or vasospasm, will continue titration of antianginals. Increase norvasc to 58m daily.    2. Generalized fatigue - may be secondary to meds, continue weaning clonidine to 0.179mbid  3. Cough/SOB - long term smoker, refer to pulmonary  F/u 1 month   JoArnoldo LenisM.D.

## 2021-03-12 ENCOUNTER — Other Ambulatory Visit: Payer: Self-pay | Admitting: Gastroenterology

## 2021-03-12 ENCOUNTER — Other Ambulatory Visit (INDEPENDENT_AMBULATORY_CARE_PROVIDER_SITE_OTHER): Payer: Self-pay | Admitting: Nurse Practitioner

## 2021-03-13 ENCOUNTER — Telehealth (INDEPENDENT_AMBULATORY_CARE_PROVIDER_SITE_OTHER): Payer: Self-pay | Admitting: Nurse Practitioner

## 2021-03-13 ENCOUNTER — Encounter (INDEPENDENT_AMBULATORY_CARE_PROVIDER_SITE_OTHER): Payer: Self-pay | Admitting: Nurse Practitioner

## 2021-03-13 NOTE — Telephone Encounter (Signed)
Per further chart review I do see where his metoprolol was annotated by another provider that he is supposed to get a total of '150mg'$  by mouth daily. Will approve the '100mg'$  dose and send refill to his pharmacy.

## 2021-03-13 NOTE — Telephone Encounter (Signed)
I attempted to call this patient this morning to discuss his refill request for metoprolol. I see both '100mg'$  tablets and '50mg'$  tablets on his medication list and I wanted to verify which dose he is taking before approving the request. He did not answer the phone but I left him a voicemail and sent him a message in Blue, will await his response before signing off on refill.

## 2021-03-18 ENCOUNTER — Other Ambulatory Visit: Payer: Self-pay

## 2021-03-18 ENCOUNTER — Encounter (HOSPITAL_COMMUNITY): Payer: Self-pay

## 2021-03-18 ENCOUNTER — Observation Stay (HOSPITAL_COMMUNITY)
Admission: EM | Admit: 2021-03-18 | Discharge: 2021-03-19 | Disposition: A | Discharge status: Home / Self Care | Payer: Medicaid Other | Attending: Internal Medicine | Admitting: Internal Medicine

## 2021-03-18 DIAGNOSIS — Z7982 Long term (current) use of aspirin: Secondary | ICD-10-CM | POA: Insufficient documentation

## 2021-03-18 DIAGNOSIS — F1910 Other psychoactive substance abuse, uncomplicated: Secondary | ICD-10-CM | POA: Insufficient documentation

## 2021-03-18 DIAGNOSIS — Z85828 Personal history of other malignant neoplasm of skin: Secondary | ICD-10-CM | POA: Insufficient documentation

## 2021-03-18 DIAGNOSIS — R7303 Prediabetes: Secondary | ICD-10-CM | POA: Diagnosis not present

## 2021-03-18 DIAGNOSIS — Z79899 Other long term (current) drug therapy: Secondary | ICD-10-CM | POA: Diagnosis not present

## 2021-03-18 DIAGNOSIS — I2510 Atherosclerotic heart disease of native coronary artery without angina pectoris: Secondary | ICD-10-CM | POA: Diagnosis not present

## 2021-03-18 DIAGNOSIS — F17210 Nicotine dependence, cigarettes, uncomplicated: Secondary | ICD-10-CM | POA: Insufficient documentation

## 2021-03-18 DIAGNOSIS — K922 Gastrointestinal hemorrhage, unspecified: Secondary | ICD-10-CM

## 2021-03-18 DIAGNOSIS — Z20822 Contact with and (suspected) exposure to covid-19: Secondary | ICD-10-CM | POA: Insufficient documentation

## 2021-03-18 DIAGNOSIS — I25118 Atherosclerotic heart disease of native coronary artery with other forms of angina pectoris: Secondary | ICD-10-CM | POA: Diagnosis not present

## 2021-03-18 DIAGNOSIS — K7400 Hepatic fibrosis, unspecified: Secondary | ICD-10-CM | POA: Diagnosis present

## 2021-03-18 DIAGNOSIS — K7460 Unspecified cirrhosis of liver: Secondary | ICD-10-CM

## 2021-03-18 DIAGNOSIS — I739 Peripheral vascular disease, unspecified: Secondary | ICD-10-CM | POA: Diagnosis present

## 2021-03-18 DIAGNOSIS — I10 Essential (primary) hypertension: Secondary | ICD-10-CM | POA: Diagnosis not present

## 2021-03-18 DIAGNOSIS — K625 Hemorrhage of anus and rectum: Secondary | ICD-10-CM | POA: Diagnosis not present

## 2021-03-18 LAB — CBC
HCT: 38.9 % — ABNORMAL LOW (ref 39.0–52.0)
HCT: 41.4 % (ref 39.0–52.0)
Hemoglobin: 13.4 g/dL (ref 13.0–17.0)
Hemoglobin: 14.1 g/dL (ref 13.0–17.0)
MCH: 32.9 pg (ref 26.0–34.0)
MCH: 31.8 pg (ref 26.0–34.0)
MCHC: 34.4 g/dL (ref 30.0–36.0)
MCHC: 34.1 g/dL (ref 30.0–36.0)
MCV: 95.6 fL (ref 80.0–100.0)
MCV: 93.5 fL (ref 80.0–100.0)
Platelets: 202 10*3/uL (ref 150–400)
Platelets: 217 10*3/uL (ref 150–400)
RBC: 4.07 MIL/uL — ABNORMAL LOW (ref 4.22–5.81)
RBC: 4.43 MIL/uL (ref 4.22–5.81)
RDW: 13.6 % (ref 11.5–15.5)
RDW: 13.6 % (ref 11.5–15.5)
WBC: 7.1 10*3/uL (ref 4.0–10.5)
WBC: 8.5 10*3/uL (ref 4.0–10.5)
nRBC: 0 % (ref 0.0–0.2)
nRBC: 0 % (ref 0.0–0.2)

## 2021-03-18 LAB — MAGNESIUM: Magnesium: 1.8 mg/dL (ref 1.7–2.4)

## 2021-03-18 LAB — COMPREHENSIVE METABOLIC PANEL
ALT: 39 U/L (ref 0–44)
AST: 28 U/L (ref 15–41)
Albumin: 4.4 g/dL (ref 3.5–5.0)
Alkaline Phosphatase: 48 U/L (ref 38–126)
Anion gap: 7 (ref 5–15)
BUN: 13 mg/dL (ref 8–23)
CO2: 27 mmol/L (ref 22–32)
Calcium: 9.2 mg/dL (ref 8.9–10.3)
Chloride: 103 mmol/L (ref 98–111)
Creatinine, Ser: 0.74 mg/dL (ref 0.61–1.24)
GFR, Estimated: >60 mL/min (ref >60)
Glucose, Bld: 125 mg/dL — ABNORMAL HIGH (ref 70–99)
Potassium: 3.3 mmol/L — ABNORMAL LOW (ref 3.5–5.1)
Sodium: 137 mmol/L (ref 135–145)
Total Bilirubin: 0.5 mg/dL (ref 0.3–1.2)
Total Protein: 7.4 g/dL (ref 6.5–8.1)

## 2021-03-18 LAB — TYPE AND SCREEN
ABO/RH(D): A NEG
Antibody Screen: NEGATIVE

## 2021-03-18 LAB — RESP PANEL BY RT-PCR (FLU A&B, COVID) ARPGX2
Influenza A by PCR: NEGATIVE
Influenza B by PCR: NEGATIVE
SARS Coronavirus 2 by RT PCR: NEGATIVE

## 2021-03-18 LAB — PROTIME-INR
INR: 0.9 (ref 0.8–1.2)
Prothrombin Time: 12.5 seconds (ref 11.4–15.2)

## 2021-03-18 LAB — POC OCCULT BLOOD, ED

## 2021-03-18 MED ORDER — POLYETHYLENE GLYCOL 3350 17 G PO PACK: 17.0000 g | PACK | Freq: Every day | ORAL | Status: DC | PRN

## 2021-03-18 MED ORDER — ONDANSETRON HCL 4 MG PO TABS: 4.0000 mg | ORAL_TABLET | Freq: Four times a day (QID) | ORAL | Status: DC | PRN

## 2021-03-18 MED ORDER — ACETAMINOPHEN 325 MG PO TABS: 650.0000 mg | ORAL_TABLET | Freq: Four times a day (QID) | ORAL | Status: DC | PRN

## 2021-03-18 MED ORDER — NICOTINE 21 MG/24HR TD PT24
21.0000 mg | MEDICATED_PATCH | Freq: Once | TRANSDERMAL | Status: AC
  Administered 2021-03-18: 21 mg via TRANSDERMAL
  Filled 2021-03-18: qty 1

## 2021-03-18 MED ORDER — CLONIDINE HCL 0.1 MG PO TABS
0.1000 mg | ORAL_TABLET | Freq: Two times a day (BID) | ORAL | Status: DC
  Administered 2021-03-18 – 2021-03-19 (×2): 0.1 mg via ORAL
  Filled 2021-03-18 (×2): qty 1

## 2021-03-18 MED ORDER — ONDANSETRON HCL 4 MG/2ML IJ SOLN: 4.0000 mg | Freq: Four times a day (QID) | INTRAMUSCULAR | Status: DC | PRN

## 2021-03-18 MED ORDER — ACETAMINOPHEN 650 MG RE SUPP: 650.0000 mg | Freq: Four times a day (QID) | RECTAL | Status: DC | PRN

## 2021-03-18 MED ORDER — PANTOPRAZOLE SODIUM 40 MG IV SOLR
40.0000 mg | Freq: Once | INTRAVENOUS | Status: AC
  Administered 2021-03-18: 40 mg via INTRAVENOUS
  Filled 2021-03-18: qty 40

## 2021-03-18 MED ORDER — PANTOPRAZOLE SODIUM 40 MG IV SOLR: 40.0000 mg | Freq: Once | INTRAVENOUS | Status: DC

## 2021-03-18 MED ORDER — POTASSIUM CHLORIDE IN NACL 20-0.9 MEQ/L-% IV SOLN: INTRAVENOUS | Status: AC

## 2021-03-18 MED ORDER — METOPROLOL SUCCINATE ER 50 MG PO TB24
50.0000 mg | ORAL_TABLET | Freq: Every day | ORAL | Status: DC
  Administered 2021-03-19: 50 mg via ORAL
  Filled 2021-03-18: qty 1

## 2021-03-18 MED ORDER — POTASSIUM CHLORIDE CRYS ER 20 MEQ PO TBCR
40.0000 meq | EXTENDED_RELEASE_TABLET | Freq: Once | ORAL | Status: AC
  Administered 2021-03-18: 40 meq via ORAL
  Filled 2021-03-18: qty 2

## 2021-03-18 MED ORDER — ATORVASTATIN CALCIUM 40 MG PO TABS: 80.0000 mg | ORAL_TABLET | Freq: Every day | ORAL | Status: DC

## 2021-03-18 NOTE — H&P (Signed)
History and Physical    HESHY AFTAB [id number] DOB: 1955-08-14 DOA: 03/18/2021  PCP: Pcp, No   Patient coming from: Home  I have personally briefly reviewed patient's old medical records in [place]  Chief Complaint: Blood per rectum  HPI: ANISH LAESSIG is a 65 y.o. male with medical history significant for liver cirrhosis, coronary artery disease, hypertension. Patient presented to the ED with complaints of multiple episodes of bleeding from his rectum that started yesterday.  Reports at least 10 episodes of days.  Reports chronic intermittent bleeding from his rectum which is mild and self resolves but this time the quantity was a lot and persistent.  Bleeding was without pain.  Reports some mild diffuse abdominal bloating, but no abdominal pain.  Reports lightheadedness after bleeding.  No chest pain. Reports history of similar bleeding which was secondary to ruptured internal hemorrhoids. Denies NSAID use.  No vomiting of blood.  ED Course: Heart rate 69 -79.  Blood pressure 110 - 156.  Hemoglobin stable 13.4.  INR 0.9.  IV Protonix '40mg'$  given.  Hospitalist to admit.  Review of Systems: As per HPI all other systems reviewed and negative.  Past Medical History:  Diagnosis Date   Allergy    Anxiety    Cirrhosis ([place]) from hep c minimal scarring   Concussion several yrs ago   no residual   Coronary artery disease    a. 03/2013 Cath/PCI: LM nl, LAD 10m(2.5x20 Promus Premier DES), LCX min irregs, RCA dom, 90p(3.0x20 Promus Premier DES), EF 55-65%.   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 Needs colonoscopy in 1 year.    COLONOSCOPY WITH PROPOFOL N/A 12/19/2014   Dr. FOneida Alar six simple adenomas and 3 hyperplastic polyps. Had flat mid-transverse colon polyp [facility]   CORONARY ANGIOPLASTY WITH STENT PLACEMENT  04/11/2013   LAD &  RCA     DR COOPER   ESOPHAGOGASTRODUODENOSCOPY (EGD) WITH PROPOFOL N/A 12/19/2014   Dr. FOneida Alar  without varices, small hiatal hernia noted, moderate non-erosive gastritis and mild duodenitis.  POSITIVE H.PYLORI. Prescribed Prevpac.   EXCISION MORTON'S NEUROMA Left 08/15/2020   Procedure: EXCISION MORTON'S NEUROMA THIRD INTERSPACE LEFT FOOT;  Surgeon: PFelipa Furnace DPM;  [facility]  Service: Podiatry;  Laterality: Left;   EXTRACORPOREAL SHOCK WAVE LITHOTRIPSY  2001   FLEXIBLE SIGMOIDOSCOPY N/A 12/23/2017  Procedure: FLEXIBLE SIGMOIDOSCOPY;  Surgeon: Danie Binder, MD;  [facility];  Service: Endoscopy;  Laterality: N/A;  1:45pm   HAND RECONSTRUCTION Left    HEMORRHOID BANDING N/A 12/23/2017   Procedure: HEMORRHOID BANDING;  Surgeon: Danie Binder, MD;  [facility];  Service: Endoscopy;  Laterality: N/A;   LEFT HEART CATH AND CORONARY ANGIOGRAPHY N/A 01/22/2021   Procedure: LEFT HEART CATH AND CORONARY ANGIOGRAPHY;  Surgeon: Martinique, Peter M, MD;  Location:  [place] [facility];  Service: Cardiovascular;  Laterality: N/A;   LEFT HEART CATHETERIZATION WITH CORONARY ANGIOGRAM N/A 04/11/2013   Procedure: LEFT HEART CATHETERIZATION WITH CORONARY ANGIOGRAM;  Surgeon: Blane Ohara, MD;  Location: [facility];  Service: Cardiovascular;  Laterality: N/A;   LEG TENDON SURGERY Right    POLYPECTOMY N/A 12/19/2014   Procedure: POLYPECTOMY;  Surgeon: Danie Binder, MD;  Location: [facility];  Service: Endoscopy;  Laterality: N/A;     reports that he has been smoking cigarettes. He started smoking about 52 years ago. He has a 20.00 pack-year smoking history. He has never used smokeless tobacco. He reports current alcohol use. He reports that he does not currently use drugs after having used the following drugs: Cocaine and Marijuana.  Allergies  Allergen Reactions   Lisinopril Swelling    Swelling neck   Crestor [Rosuvastatin] Other (See Comments)    Severe muscle aches   Chantix [Varenicline] Other (See Comments)    Abdominal pain   Neosporin [Neomycin-Bacitracin Zn-Polymyx] Rash    blistered    Family History  Problem Relation Age of Onset   Stroke Mother    Aneurysm Mother    Hypertension Mother    Heart disease Father    Hypertension Father    Heart disease Brother    Hypertension Brother    Alcohol abuse Brother    Diabetes Paternal Aunt    Colon cancer Neg Hx    Prior to Admission medications   Medication Sig Start Date End Date Taking? Authorizing Provider  amLODipine (NORVASC) 10 MG tablet Take 1 tablet (10 mg total) by mouth daily. 02/20/21   Arnoldo Lenis, MD  aspirin EC 81 MG tablet Take 81 mg by mouth at bedtime.     [provider]  atorvastatin (LIPITOR) 80 MG tablet Take 1 tablet (80 mg total) by mouth daily. 01/18/21 04/18/21  Strader, Fransisco Hertz, PA-C  chlorthalidone (HYGROTON) 25 MG tablet TAKE 1 TABLET BY MOUTH DAILY. 01/10/21   Arnoldo Lenis, MD  Cholecalciferol (VITAMIN D3) 2000 units TABS Take 4,000 Units by  mouth 2 (two) times daily.    [provider]  cloNIDine (CATAPRES) 0.1 MG tablet Take 1 tablet (0.1 mg total) by mouth 2 (two) times daily. 02/20/21   Arnoldo Lenis, MD  clopidogrel (PLAVIX) 75 MG tablet TAKE 1 TABLET BY MOUTH ONCE DAILY. 02/11/21   Arnoldo Lenis, MD  isosorbide mononitrate (IMDUR) 30 MG 24 hr tablet Take 1 tablet (30 mg total) by mouth daily. 02/18/21 05/19/21  Imogene Burn, PA-C  metoprolol succinate (TOPROL-XL) 100 MG 24 hr tablet TAKE ONE TABLET BY MOUTH ONCE DAILY. 03/13/21   Ailene Ards, NP  metoprolol succinate (TOPROL-XL) 50 MG 24 hr tablet Take 1 tablet (50 mg total) by mouth daily. Take with or immediately following a meal. 02/18/21 05/19/21  Imogene Burn, PA-C  Multiple Vitamin (MULTIVITAMIN WITH MINERALS) TABS tablet Take 1 tablet by mouth daily.    [provider]  nitroGLYCERIN (NITROSTAT) 0.4 MG SL tablet Place 1 tablet (0.4 mg total) under the tongue every 5 (five) minutes as needed for chest pain. 01/18/21   Strader, Fransisco Hertz, PA-C  Omega 3 1200 MG CAPS Take 2,400 mg by mouth daily.    [provider]  pantoprazole (PROTONIX) 40 MG tablet TAKE ONE TABLET BY MOUTH ONCE DAILY. 03/12/21   Annitta Needs, NP  potassium chloride SA (KLOR-CON) 20 MEQ tablet TAKE ONE TABLET BY MOUTH ONCE DAILY. 07/16/20   Arnoldo Lenis, MD    Physical Exam: Vitals:   03/18/21 1735 03/18/21 1800 03/18/21 1815 03/18/21 1838  BP: (!) 143/68 125/69  (!) 156/74  Pulse: 74 70 71 79  Resp: '15 19 19 18  '$ Temp:      TempSrc:      SpO2: 99% 97% 96% 100%  Weight:      Height:        Constitutional: NAD, calm, comfortable Vitals:   03/18/21 1735 03/18/21 1800 03/18/21 1815 03/18/21 1838  BP: (!) 143/68 125/69  (!) 156/74  Pulse: 74 70 71 79  Resp: '15 19 19 18  '$ Temp:      TempSrc:      SpO2: 99% 97% 96% 100%  Weight:      Height:       Eyes: PERRL, lids and conjunctivae normal ENMT: Mucous membranes are moist.  Neck: normal, supple, no  masses, no thyromegaly Respiratory: clear to auscultation bilaterally, no wheezing, no crackles. Normal respiratory effort. No accessory muscle use.  Cardiovascular: Regular rate and rhythm, no murmurs / rubs / gallops. No extremity edema. 2+ pedal pulses. Abdomen: no tenderness, no masses palpated. No hepatosplenomegaly. Bowel sounds positive.  Musculoskeletal: no clubbing / cyanosis. No joint deformity upper and lower extremities. Good ROM, no contractures. Normal muscle tone.  Skin: no rashes, lesions, ulcers. No induration Neurologic: No apparent cranial nerve abnormality, moving extremities spontaneously Psychiatric: Normal judgment and insight. Alert and oriented x 3. Normal mood.   Labs on Admission: I have personally reviewed following labs and imaging studies  CBC: Recent Labs  Lab 03/18/21 1508  WBC 7.1  HGB 13.4  HCT 38.9*  MCV 95.6  PLT 123XX123   Basic Metabolic Panel: Recent Labs  Lab 03/18/21 1508  NA 137  K 3.3*  CL 103  CO2 27  GLUCOSE 125*  BUN 13  CREATININE 0.74  CALCIUM 9.2   GFR: Estimated Creatinine Clearance: 101.2 mL/min (by C-G formula based on SCr of 0.74 mg/dL). Liver Function Tests: Recent Labs  Lab 03/18/21 1508  AST 28  ALT 39  ALKPHOS 48  BILITOT 0.5  PROT 7.4  ALBUMIN 4.4   Coagulation Profile: Recent Labs  Lab 03/18/21 1508  INR 0.9    Radiological Exams on Admission: No results found.  EKG: None..   Assessment/Plan Principal Problem:   Rectal bleeding Active Problems:   Coronary artery disease   Essential hypertension   PAD (peripheral artery disease) (HCC)   Cirrhosis of liver without ascites (HCC)  Rectal bleeding- stable vitals, hemoglobin stable at 13.4.  He is on Plavix and aspirin, last dose of both were this morning.  Denies NSAID use.  No hematemesis.  History of liver cirrhosis.  INR 0.9. - Last sigmoidoscopy 2019 by Dr. Oneida Alar, large hemorrhoids that were banded. -Last EGD 2016 -moderate nonerosive gastritis  and mild duodenitis.  - Monitor Hgb loosely, low hemoglobin > 8 - GI consult - NPO midnight -Hold IV Protonix 40 daily - N/s +  20 kcl 75cc/hr x 20hrs  Hypokalemia- 3.3 - replete - Check Mag  Coronary artery disease-stable.  Follows with Dr. Harl Bowie.  History of DES 2014.   -Hold home on aspirin and Plavix.    Hypertension- Stable. -Hold metoprolol 100 mg, Norvasc 10 mg, chlorthalidone 25 mg for now with GI bleed -Resume clonidine 0.1 mg twice daily to avoid rebound hypertension  Liver cirrhosis-compensated.  At this time no overt stigmata of chronic liver disease.  DVT prophylaxis: SCDS Code Status: Full code Family Communication: None at bedside. Disposition Plan: ~ 2 days Consults called: GI Admission status: Obs tele  Bethena Roys MD [facility]  03/18/2021, 8:15 PM

## 2021-03-18 NOTE — ED Triage Notes (Signed)
Pt. States they have rectal bleeding. Pt. States they have bright red blood in their stool since yesterday.

## 2021-03-18 NOTE — ED Provider Notes (Signed)
[facility] EMERGENCY DEPARTMENT Provider Note   CSN: [id number] Arrival date & time: 03/18/21  1421     History Chief Complaint  Patient presents with   Rectal Bleeding    Duane Adams is a 65 y.o. male who presents to the ED with a cc of rectal bleeding.  He has a past medical history of*, CAD history.  Patient states that he was doing a lot of heavy lifting and this when he went to the bathroom the past 30 hours he has had multiple episodes of Bright red blood per rectum.  He believes he has "filled the toilet bowl with blood more than 11 times."  He has associated lightheadedness.  He states that he had rupture internal hemorrhoid that was the cause of his.  He is on Plavix and aspirin.  He denies any significant abdominal pain, nausea   Rectal Bleeding     Past Medical History:  Diagnosis Date   Allergy    Anxiety    Cirrhosis ([place]) from hep c minimal scarring   Concussion several yrs ago   no residual   Coronary artery disease    a. 03/2013 Cath/PCI: LM nl, LAD 34m(2.5x20 Promus Premier DES), LCX min irregs, RCA dom, 90p(3.0x20 Promus Premier DES), EF 55-65%.   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  Tobacco abuse     Patient Active Problem List   Diagnosis Date Noted   Unstable angina ([place] 01/22/2021   Vitamin D deficiency 04/09/2020   Morton's neuroma of left foot 04/09/2020   Insomnia 09/26/2019   Prediabetes 06/23/2019   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   CVA (cerebral vascular [place]) ([place] 01/31/2019   Rectal bleeding 11/26/2017   Hemorrhoids 11/26/2017    History of colonic polyps 11/26/2017   PAD (peripheral artery disease) ([place] 05/05/2017   Intermittent claudication ([place] 05/05/2017   Hepatitis C virus infection resolved after antiviral drug therapy 05/05/2017   Cirrhosis of liver without ascites ([place] 05/05/2017   Esophageal reflux 07/23/2015   Nicotine dependence, cigarettes, with other nicotine-induced disorders 07/23/2015   Hyperlipidemia 06/13/2015   Colon polyps 01/14/2015   Essential hypertension 04/12/2013   Coronary artery disease    Unspecified malignant neoplasm of skin of lower limb, including hip 03/16/2013    Past Surgical History:  Procedure Laterality Date   ABDOMINAL AORTOGRAM W/LOWER EXTREMITY N/A 06/15/2017   Procedure: ABDOMINAL AORTOGRAM W/LOWER EXTREMITY;  Surgeon: DAngelia Mould MD;  [facility];  Service: Cardiovascular;  Laterality: N/A;  Bilateral   BIOPSY N/A 12/19/2014   Procedure: BIOPSY;  Surgeon: SDanie Binder MD;  Location: [facility];  Service: Endoscopy;  Laterality: N/A;   COLONOSCOPY  June 2016   [facility]: with endoscopic mucosal resection. 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 Needs colonoscopy in 1 year.    COLONOSCOPY WITH PROPOFOL N/A 12/19/2014   Dr. FOneida Alar six simple adenomas and 3 hyperplastic polyps. Had flat mid-transverse colon polyp [facility] for [facility]   CORONARY ANGIOPLASTY WITH STENT PLACEMENT  04/11/2013   LAD &  RCA     DR COOPER   ESOPHAGOGASTRODUODENOSCOPY (EGD) WITH PROPOFOL N/A 12/19/2014   Dr. Oneida Alar:  without varices, small hiatal hernia noted, moderate non-erosive gastritis and mild duodenitis.  POSITIVE H.PYLORI. Prescribed Prevpac.   EXCISION MORTON'S NEUROMA Left 08/15/2020   Procedure: EXCISION MORTON'S NEUROMA THIRD INTERSPACE LEFT FOOT;  Surgeon: [name], DPM;  Location: [place];  Service: Podiatry;  Laterality: Left;   EXTRACORPOREAL SHOCK  WAVE LITHOTRIPSY  2001   FLEXIBLE SIGMOIDOSCOPY N/A 12/23/2017   Procedure: FLEXIBLE SIGMOIDOSCOPY;  Surgeon: Danie Binder, MD;  [facility];  Service: Endoscopy;  Laterality: N/A;  1:45pm   HAND RECONSTRUCTION Left    HEMORRHOID BANDING N/A 12/23/2017   Procedure: HEMORRHOID BANDING;  Surgeon: Danie Binder, MD;  [facility];  Service: Endoscopy;  Laterality: N/A;   LEFT HEART CATH AND CORONARY ANGIOGRAPHY N/A 01/22/2021   Procedure: LEFT HEART CATH AND CORONARY ANGIOGRAPHY;  Surgeon: Martinique, Peter M, MD;  Location: [place] [facility];  Service: Cardiovascular;  Laterality: N/A;   LEFT HEART CATHETERIZATION WITH CORONARY ANGIOGRAM N/A 04/11/2013   Procedure: LEFT HEART CATHETERIZATION WITH CORONARY ANGIOGRAM;  Surgeon: Blane Ohara, MD;  Location: [facility] CATH LAB;  Service: Cardiovascular;  Laterality: N/A;   LEG TENDON SURGERY Right    POLYPECTOMY N/A 12/19/2014   Procedure: POLYPECTOMY;  Surgeon: Danie Binder, MD;  [facility];  Service: Endoscopy;  Laterality: N/A;       Family History  Problem Relation Age of Onset   Stroke Mother    Aneurysm Mother    Hypertension Mother    Heart disease Father    Hypertension Father    Heart disease Brother    Hypertension Brother    Alcohol abuse Brother    Diabetes Paternal Aunt    Colon cancer Neg Hx     Social History   Tobacco Use   Smoking status: Light Smoker    Packs/day: 0.50    Years: 40.00    Pack years: 20.00    Types: Cigarettes    Start date: 07/14/1968   Smokeless tobacco: Never   Tobacco comments:    5-6 Cigarettes per day  Vaping Use   Vaping Use: Never used  Substance Use Topics   Alcohol use: Yes    Alcohol/week: 0.0 standard drinks    Comment: As of 11/26/17: social - 1-2 beers a sitting x 1-2 times a month   Drug use: Not Currently    Types: Cocaine, Marijuana    Comment: cocaine marijuana and acid last used 1984    Home Medications Prior to Admission medications    Medication Sig Start Date End Date Taking? Authorizing Provider  amLODipine (NORVASC) 10 MG tablet Take 1 tablet (10 mg total) by mouth daily. 02/20/21   Arnoldo Lenis, MD  aspirin EC 81 MG tablet Take 81 mg by mouth at bedtime.     [provider]  atorvastatin (LIPITOR) 80 MG tablet Take 1 tablet (80 mg total) by mouth daily. 01/18/21 04/18/21  Strader, Fransisco Hertz, PA-C  chlorthalidone (HYGROTON) 25 MG tablet TAKE 1 TABLET BY MOUTH DAILY. 01/10/21   Arnoldo Lenis, MD  Cholecalciferol (VITAMIN D3) 2000 units TABS Take 4,000 Units by mouth 2 (two) times daily.    [provider]  cloNIDine (CATAPRES) 0.1 MG tablet Take 1 tablet (  0.1 mg total) by mouth 2 (two) times daily. 02/20/21   Arnoldo Lenis, MD  clopidogrel (PLAVIX) 75 MG tablet TAKE 1 TABLET BY MOUTH ONCE DAILY. 02/11/21   Arnoldo Lenis, MD  isosorbide mononitrate (IMDUR) 30 MG 24 hr tablet Take 1 tablet (30 mg total) by mouth daily. 02/18/21 05/19/21  Imogene Burn, PA-C  metoprolol succinate (TOPROL-XL) 100 MG 24 hr tablet TAKE ONE TABLET BY MOUTH ONCE DAILY. 03/13/21   Ailene Ards, NP  metoprolol succinate (TOPROL-XL) 50 MG 24 hr tablet Take 1 tablet (50 mg total) by mouth daily. Take with or immediately following a meal. 02/18/21 05/19/21  Imogene Burn, PA-C  Multiple Vitamin (MULTIVITAMIN WITH MINERALS) TABS tablet Take 1 tablet by mouth daily.    [provider]  nitroGLYCERIN (NITROSTAT) 0.4 MG SL tablet Place 1 tablet (0.4 mg total) under the tongue every 5 (five) minutes as needed for chest pain. 01/18/21   Strader, Fransisco Hertz, PA-C  Omega 3 1200 MG CAPS Take 2,400 mg by mouth daily.    [provider]  pantoprazole (PROTONIX) 40 MG tablet TAKE ONE TABLET BY MOUTH ONCE DAILY. 03/12/21   Annitta Needs, NP  potassium chloride SA (KLOR-CON) 20 MEQ tablet TAKE ONE TABLET BY MOUTH ONCE DAILY. 07/16/20   Arnoldo Lenis, MD    Allergies    Lisinopril, Crestor [rosuvastatin], Chantix  [varenicline], and Neosporin [neomycin-bacitracin zn-polymyx]  Review of Systems   Review of Systems  Gastrointestinal:  Positive for hematochezia.  Ten systems reviewed and are negative for acute change, except as noted in the HPI.   Physical Exam Updated Vital Signs BP 128/73 (BP Location: Right Arm)   Pulse 79   Temp 98.7 F (37.1 C) (Oral)   Resp 18   Ht '5\' 9"'$  (1.753 m)   Wt 85.7 kg   SpO2 100%   BMI 27.90 kg/m   Physical Exam Physical Exam  Nursing note and vitals reviewed. Constitutional: He appears well-developed and well-nourished. No distress.  HENT:  Head: Normocephalic and atraumatic.  Eyes: Conjunctivae normal are normal. No scleral icterus.  Neck: Normal range of motion. Neck supple.  Cardiovascular: Normal rate, regular rhythm and normal heart sounds.   Pulmonary/Chest: Effort normal and breath sounds normal. No respiratory distress.  Abdominal: Soft. There is no tenderness.  Musculoskeletal: He exhibits no edema.  Neurological: He is alert.  Skin: Skin is warm and dry. He is not diaphoretic.  [id number] chaperoned. Digital Rectal Exam reveals sphincter with good tone. No external hemorrhoids. No masses or fissures. Stool color is brown with BRB. Psychiatric: His behavior is normal.   ED Results / Procedures / Treatments   Labs (all labs ordered are listed, but only abnormal results are displayed) Labs Reviewed  COMPREHENSIVE METABOLIC PANEL  CBC  POC OCCULT BLOOD, ED  TYPE AND SCREEN    EKG None  Radiology No results found.  Procedures Procedures   Medications Ordered in ED Medications - No data to display  ED Course  I have reviewed the triage vital signs and the nursing notes.  Pertinent labs & imaging results that were available during my care of the patient were reviewed by me and considered in my medical decision making (see chart for details).    MDM Rules/Calculators/A&P                           Patient here with rectal  bleeding. The differential diagnosis for  lower GI bleed includes but is not limited to high flow upper GI bleed, diverticulosis or radiculitis, vascular ectasia/arteriovenous malformation, inflammatory bowel disease, infectious colitis, mesenteric ischemia or ischemic colitis, Meckel's diverticulum, colorectal cancer or polyps, internal hemorrhoids, aortoenteric fistula, rectal foreign body, rectal ulceration or anal fissure. I ordered and reviewed labs which include CBC which shows normal hemoglobin level, slightly low hematocrit, CMP with mildly low potassium slightly elevated blood glucose both of insignificant value, PT/INR within normal limits, negative respiratory panel.  Patient's point of care stool is positive.  Patient has positive orthostatic vital signs concerning for bleeding.  I discussed the case with Dr. Denton Brick  who will admit the patient.  I do not feel that he needs emergent scope but is unsafe for discharge at this time.  Final Clinical Impression(s) / ED Diagnoses Final diagnoses:  None    Rx / DC Orders ED Discharge Orders     None        Margarita Mail, PA-C 03/18/21 Brock Ra, MD 03/19/21 1744

## 2021-03-19 ENCOUNTER — Telehealth: Payer: Self-pay | Admitting: Gastroenterology

## 2021-03-19 ENCOUNTER — Encounter (HOSPITAL_COMMUNITY): Payer: Self-pay | Admitting: Internal Medicine

## 2021-03-19 DIAGNOSIS — K625 Hemorrhage of anus and rectum: Secondary | ICD-10-CM

## 2021-03-19 DIAGNOSIS — I10 Essential (primary) hypertension: Secondary | ICD-10-CM

## 2021-03-19 DIAGNOSIS — I2510 Atherosclerotic heart disease of native coronary artery without angina pectoris: Secondary | ICD-10-CM | POA: Diagnosis not present

## 2021-03-19 DIAGNOSIS — K922 Gastrointestinal hemorrhage, unspecified: Secondary | ICD-10-CM

## 2021-03-19 DIAGNOSIS — I739 Peripheral vascular disease, unspecified: Secondary | ICD-10-CM

## 2021-03-19 DIAGNOSIS — K7460 Unspecified cirrhosis of liver: Secondary | ICD-10-CM | POA: Diagnosis not present

## 2021-03-19 LAB — CBC
HCT: 36.8 % — ABNORMAL LOW (ref 39.0–52.0)
Hemoglobin: 12.4 g/dL — ABNORMAL LOW (ref 13.0–17.0)
MCH: 32.3 pg (ref 26.0–34.0)
MCHC: 33.7 g/dL (ref 30.0–36.0)
MCV: 95.8 fL (ref 80.0–100.0)
Platelets: 189 10*3/uL (ref 150–400)
RBC: 3.84 MIL/uL — ABNORMAL LOW (ref 4.22–5.81)
RDW: 13.6 % (ref 11.5–15.5)
WBC: 7.9 10*3/uL (ref 4.0–10.5)
nRBC: 0 % (ref 0.0–0.2)

## 2021-03-19 LAB — HIV ANTIBODY (ROUTINE TESTING W REFLEX): HIV Screen 4th Generation wRfx: NONREACTIVE

## 2021-03-19 LAB — HEMOGLOBIN AND HEMATOCRIT, BLOOD
HCT: 39 % (ref 39.0–52.0)
HCT: 40.2 % (ref 39.0–52.0)
Hemoglobin: 13.3 g/dL (ref 13.0–17.0)
Hemoglobin: 13.2 g/dL (ref 13.0–17.0)

## 2021-03-19 LAB — BASIC METABOLIC PANEL
Anion gap: 7 (ref 5–15)
BUN: 12 mg/dL (ref 8–23)
CO2: 28 mmol/L (ref 22–32)
Calcium: 8.6 mg/dL — ABNORMAL LOW (ref 8.9–10.3)
Chloride: 104 mmol/L (ref 98–111)
Creatinine, Ser: 0.71 mg/dL (ref 0.61–1.24)
GFR, Estimated: >60 mL/min (ref >60)
Glucose, Bld: 111 mg/dL — ABNORMAL HIGH (ref 70–99)
Potassium: 3.7 mmol/L (ref 3.5–5.1)
Sodium: 139 mmol/L (ref 135–145)

## 2021-03-19 MED ORDER — POTASSIUM CHLORIDE CRYS ER 20 MEQ PO TBCR: 40.0000 meq | EXTENDED_RELEASE_TABLET | Freq: Once | ORAL | Status: DC

## 2021-03-19 MED ORDER — INFLUENZA VAC SPLIT QUAD 0.5 ML IM SUSY: 0.5000 mL | PREFILLED_SYRINGE | INTRAMUSCULAR | Status: DC

## 2021-03-19 NOTE — Consult Note (Signed)
$'@LOGO'u$ @   Referring Provider: [facility] Primary Care Physician:  Pcp, No Primary Gastroenterologist:  Dr. Oneida Alar previously.  Date of Admission: 03/18/21 Date of Consultation: 03/19/21  Reason for Consultation:  Rectal Bleeding  HPI:  Duane Adams is a 65 y.o. year old male with history of CAD with history of PCI on Plavix and aspirin, CVA, HTN, GERD, hepatitis C fibrosis score F2/F3 status post Harvoni treatment in 2016 and documented sustained SVR, adenomatous colon polyp, tubular adenoma with focal high-grade dysplasia in 2016, last colonoscopy was normal in 2018, currently overdue for surveillance, history of rectal bleeding in the setting of internal hemorrhoids s/p flex sig with banding in 2019 who presented to the emergency room 9/5 with chief complaint of multiple episodes of painless rectal bleeding x1 day.  In the ED:  Heart rate 69 -79.  Systolic blood pressure [id number] - 156. Evidence of orthostatic BP.  Hemoglobin stable 13.4.   Chemistry panel remarkable for potassium 2.3 and glucose 125.   INR 0.9.   Fecal occult blood positive. He was give IV Protonix x1 and IV fluids.  GI consulted for further evaluation.   Hg declined to 12.4 today. Potassium corrected to 3.7.   Today:  Chronic intermittent rectal bleeding that usually self resolves. Once a  month. Usually small amounts a couple times then over with. This time, rectal bleeding started Sunday and was persistent. Reports 8-9 episodes on Sunday. 5 episodes total since presenting to the hospital. Last episode was about 20 min ago. Feels it is starting to slow down. Spacing out and not as much blood. Has been passing mostly blood, little stool. No black stool. No abdominal or rectal pain. No nausea or vomiting. Associated mild intermittent lightheadedness, no pre-syncope or syncope.   No heartburn. History of regurgitation resolved with Protonix 40 mg daily.   Diarrhea a couple weeks ago. Wasn't bloody. 3 Bms per day.  Watery. Diarrhea had resolved prior to bleeding starting on Sunday. No history of constipation. Typically with 1 BM daily.   Denies swelling in the lower extremities or abdomen, changes in mental status/confusion, yellowing of the eyes or skin.  Last dose of Plavix on morning of 9/5.  Rare Aleve.  4 beer every 2 weeks.  No current illicit drug use. Last used in 1986.  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 03/2013 Cath/PCI: LM nl, LAD 44m(2.5x20 Promus Premier DES), LCX min irregs, RCA dom, 90p(3.0x20 Promus Premier DES), EF 55-65%.   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 Needs colonoscopy in 1 year.    COLONOSCOPY WITH PROPOFOL N/A 12/19/2014   Dr. Oneida Alar: six simple adenomas and 3 hyperplastic polyps. Had flat mid-transverse colon polyp and referred to [facility] for endoscopic mucosal resection,  which is scheduled for July 22   CORONARY ANGIOPLASTY WITH STENT PLACEMENT  04/11/2013   LAD &  RCA     DR COOPER   ESOPHAGOGASTRODUODENOSCOPY (EGD) WITH PROPOFOL N/A 12/19/2014   Dr. Oneida Alar:  without varices, small hiatal hernia noted, moderate non-erosive gastritis and mild duodenitis.  POSITIVE H.PYLORI. Prescribed Prevpac.   EXCISION MORTON'S NEUROMA Left 08/15/2020   Procedure: EXCISION MORTON'S NEUROMA THIRD INTERSPACE LEFT FOOT;  Surgeon: [name], DPM;  Location: [place];  Service: Podiatry;  Laterality: Left;   EXTRACORPOREAL SHOCK WAVE LITHOTRIPSY  2001   FLEXIBLE SIGMOIDOSCOPY N/A 12/23/2017   Procedure: FLEXIBLE SIGMOIDOSCOPY;  Surgeon: Danie Binder, MD;  [facility];  Service: Endoscopy;  Laterality: N/A;  1:45pm   HAND RECONSTRUCTION Left    HEMORRHOID BANDING N/A 12/23/2017   Procedure: HEMORRHOID BANDING;  Surgeon: Danie Binder, MD;  [facility];  Service: Endoscopy;  Laterality: N/A;   LEFT HEART CATH AND CORONARY ANGIOGRAPHY N/A 01/22/2021   Procedure: LEFT HEART CATH AND CORONARY ANGIOGRAPHY;  Surgeon: Martinique, Peter M, MD;  Location: [place] [facility];  Service: Cardiovascular;  Laterality: N/A;   LEFT HEART CATHETERIZATION WITH CORONARY ANGIOGRAM N/A 04/11/2013   Procedure: LEFT HEART CATHETERIZATION WITH CORONARY ANGIOGRAM;  Surgeon: Blane Ohara, MD;  Location: [facility];  Service: Cardiovascular;  Laterality: N/A;   LEG TENDON SURGERY Right    POLYPECTOMY N/A 12/19/2014   Procedure: POLYPECTOMY;  Surgeon: Danie Binder, MD;  Location: [facility];  Service: Endoscopy;  Laterality: N/A;    Prior to Admission medications   Medication Sig Start Date End Date Taking? Authorizing Provider  amLODipine (NORVASC) 10 MG tablet Take 1 tablet (10 mg total) by mouth daily. 02/20/21  Yes BranchAlphonse Guild, MD  aspirin EC 81 MG tablet Take 81 mg by mouth at bedtime.    Yes [provider]  atorvastatin (LIPITOR) 80 MG tablet  Take 1 tablet (80 mg total) by mouth daily. 01/18/21 04/18/21 Yes Strader, Fransisco Hertz, PA-C  chlorthalidone (HYGROTON) 25 MG tablet TAKE 1 TABLET BY MOUTH DAILY. Patient taking differently: Take 25 mg by mouth daily. 01/10/21  Yes BranchAlphonse Guild, MD  Cholecalciferol (VITAMIN D3) 2000 units TABS Take 4,000 Units by mouth 2 (two) times daily.   Yes [provider]  cloNIDine (CATAPRES) 0.1 MG tablet Take 1 tablet (0.1 mg total) by mouth 2 (two) times daily. 02/20/21  Yes BranchAlphonse Guild, MD  clopidogrel (PLAVIX) 75 MG tablet TAKE 1 TABLET BY MOUTH ONCE DAILY. Patient taking differently: Take 75 mg by mouth daily. 02/11/21  Yes BranchAlphonse Guild, MD  isosorbide mononitrate (IMDUR) 30 MG 24 hr tablet Take 1 tablet (30 mg total) by mouth daily. 02/18/21 05/19/21 Yes Imogene Burn, PA-C  metoprolol succinate (TOPROL-XL) 100 MG 24 hr tablet TAKE ONE TABLET BY MOUTH ONCE DAILY. 03/13/21  Yes Ailene Ards, NP  metoprolol succinate (TOPROL-XL) 50 MG 24 hr tablet Take  1 tablet (50 mg total) by mouth daily. Take with or immediately following a meal. 02/18/21 05/19/21 Yes Imogene Burn, PA-C  Multiple Vitamin (MULTIVITAMIN WITH MINERALS) TABS tablet Take 1 tablet by mouth daily.   Yes [provider]  Omega 3 1200 MG CAPS Take 2,400 mg by mouth daily.   Yes [provider]  pantoprazole (PROTONIX) 40 MG tablet TAKE ONE TABLET BY MOUTH ONCE DAILY. Patient taking differently: Take 40 mg by mouth daily. 03/12/21  Yes Annitta Needs, NP  potassium chloride SA (KLOR-CON) 20 MEQ tablet TAKE ONE TABLET BY MOUTH ONCE DAILY. Patient taking differently: Take 20 mEq by mouth daily. 07/16/20  Yes Branch, Alphonse Guild, MD  nitroGLYCERIN (NITROSTAT) 0.4 MG SL tablet Place 1 tablet (0.4 mg total) under the tongue every 5 (five) minutes as needed for chest pain. 01/18/21   Ahmed Prima, Fransisco Hertz, PA-C    Current Facility-Administered Medications  Medication Dose Route Frequency Provider Last Rate Last Admin    0.9 % NaCl with KCl 20 mEq/ L  infusion   Intravenous Continuous Emokpae, Ejiroghene E, MD 100 mL/hr at 03/18/21 2326 New Bag at 03/18/21 2326   acetaminophen (TYLENOL) tablet 650 mg  650 mg Oral Q6H PRN Emokpae, Ejiroghene E, MD       Or   acetaminophen (TYLENOL) suppository 650 mg  650 mg Rectal Q6H PRN Emokpae, Ejiroghene E, MD       cloNIDine (CATAPRES) tablet 0.1 mg  0.1 mg Oral BID Emokpae, Ejiroghene E, MD   0.1 mg at 03/19/21 0805   metoprolol succinate (TOPROL-XL) 24 hr tablet 50 mg  50 mg Oral Daily Emokpae, Ejiroghene E, MD   50 mg at 03/19/21 [id number]   nicotine (NICODERM CQ - dosed in mg/24 hours) patch 21 mg  21 mg Transdermal Once Harris, Abigail, PA-C   21 mg at 03/18/21 1741   ondansetron (ZOFRAN) tablet 4 mg  4 mg Oral Q6H PRN Emokpae, Ejiroghene E, MD       Or   ondansetron (ZOFRAN) injection 4 mg  4 mg Intravenous Q6H PRN Emokpae, Ejiroghene E, MD       pantoprazole (PROTONIX) injection 40 mg  40 mg Intravenous Once Emokpae, Ejiroghene E, MD       polyethylene glycol (MIRALAX / GLYCOLAX) packet 17 g  17 g Oral Daily PRN Emokpae, Ejiroghene E, MD        Allergies as of 03/18/2021 - Review Complete 03/18/2021  Allergen Reaction Noted   Lisinopril Swelling 08/18/2012   Crestor [rosuvastatin] Other (See Comments) 01/19/2021   Chantix [varenicline] Other (See Comments) 07/20/2017   Neosporin [neomycin-bacitracin zn-polymyx] Rash 10/28/2013    Family History  Problem Relation Age of Onset   Stroke Mother    Aneurysm Mother    Hypertension Mother    Heart disease Father    Hypertension Father    Heart disease Brother    Hypertension Brother    Alcohol abuse Brother    Diabetes Paternal Aunt    Colon cancer Neg Hx     Social History   Socioeconomic History   Marital status: Soil scientist    Spouse name: Tisha   Number of children: 2   Years of education: 14   Highest education level: Not on file  Occupational History   Occupation: disabled    Comment: heart   Tobacco Use   Smoking status: Light Smoker    Packs/day: 0.50    Years: 40.00    Pack years: 20.00  Types: Cigarettes    Start date: 07/14/1968   Smokeless tobacco: Never   Tobacco comments:    5-6 Cigarettes per day  Vaping Use   Vaping Use: Never used  Substance and Sexual Activity   Alcohol use: Yes    Alcohol/week: 0.0 standard drinks    Comment: As of 11/26/17: social - 1-2 beers a sitting x 1-2 times a month   Drug use: Not Currently    Types: Cocaine, Marijuana    Comment: cocaine marijuana and acid last used 1984   Sexual activity: Not Currently  Other Topics Concern   Not on file  Social History Narrative   Disabled from heart disease   Previously worked in Scientist, research (medical) with Neurosurgeon for 28 years   Leisure: Teaching laboratory technician   Social Determinants of Radio broadcast assistant Strain: Not on file  Food Insecurity: Not on file  Transportation Needs: Not on file  Physical Activity: Not on file  Stress: Not on file  Social Connections: Not on file  Intimate Partner Violence: Not on file    Review of Systems: Gen: Denies fever, chills, cold or flulike symptoms, presyncope, syncope. CV: Denies chest pain, heart palpitations. Resp: Denies shortness of breath.  Admits to chronic intermittent cough. GI: See HPI GU : Denies urinary burning, urinary frequency, urinary incontinence.  MS: Denies joint pain.  Derm: Denies rash Psych: Denies depression, anxiety Heme: See HPI  Physical Exam: Vital signs in last 24 hours: Temp:  [98.1 F (36.7 C)-98.7 F (37.1 C)] 98.1 F (36.7 C) (09/06 0641) Pulse Rate:  [69-85] 77 (09/06 0753) Resp:  [15-20] 20 (09/06 0753) BP: (110-156)/(58-81) 143/69 (09/06 0753) SpO2:  [96 %-100 %] 100 % (09/06 0753) Weight:  [84.7 kg-85.7 kg] 84.7 kg (09/06 0641) Last BM Date: 03/19/21 General:   Alert,  Well-developed, well-nourished, pleasant and cooperative in NAD Head:  Normocephalic and atraumatic. Eyes:  Sclera clear, no icterus.  Conjunctiva pink. Ears:  Normal auditory acuity. Lungs:  Clear throughout to auscultation.   No wheezes, crackles, or rhonchi. No acute distress. Heart:  Regular rate and rhythm; no murmurs, clicks, rubs,  or gallops. Abdomen:  Soft, nontender and nondistended. No masses, hepatosplenomegaly or hernias noted. Normal bowel sounds, without guarding, and without rebound.   Rectal:  Deferred until time of colonoscopy.   Msk:  Symmetrical without gross deformities. Normal posture. Back: Erythematous cyst in mid back just off the midline to the left.  Extremities:  Without edema. Neurologic:  Alert and  oriented x4;  grossly normal neurologically. Skin:  Intact without significant lesions or rashes. Psych: Normal mood and affect.  Intake/Output from previous day: 09/05 0701 - 09/06 0700 In: 554.7 [I.V.:554.7] Out: -  Intake/Output this shift: No intake/output data recorded.  Lab Results: Recent Labs    03/18/21 1508 03/18/21 2019 03/19/21 0518  WBC 7.1 8.5 7.9  HGB 13.4 14.1 12.4*  HCT 38.9* 41.4 36.8*  PLT 202 217 189   BMET Recent Labs    03/18/21 1508 03/19/21 0518  NA 137 139  K 3.3* 3.7  CL 103 104  CO2 27 28  GLUCOSE 125* 111*  BUN 13 12  CREATININE 0.74 0.71  CALCIUM 9.2 8.6*   LFT Recent Labs    03/18/21 1508  PROT 7.4  ALBUMIN 4.4  AST 28  ALT 39  ALKPHOS 48  BILITOT 0.5   PT/INR Recent Labs    03/18/21 1508  LABPROT 12.5  INR 0.9    Impression: 65  y.o. year old male with history of CAD with history of PCI on Plavix and aspirin, CVA, HTN, GERD, hepatitis C fibrosis score F2/F3 status post Harvoni treatment in 2016 and documented sustained SVR, adenomatous colon polyp, tubular adenoma with focal high-grade dysplasia in 2016 overdue for surveillance, history of rectal bleeding in the setting of internal hemorrhoids s/p flex sig with banding in 2019 who presented to the emergency room 9/5 with chief complaint of multiple episodes of painless rectal  bleeding. Gi consulted for further evaluation.   Hematochezia: Painless rectal bleeding starting 9/4 for 8-9 episodes first day, total of 5 episodes since presenting to the ED on 9/5 around 3 PM. Overall, seems to be slowing down, last occurrence around 10 am today.  Hg on admission 13.4, down to 12.4 this morning. Associated mild lightheadedness, but no pre-syncope or syncope. Prior to this, he reports chronic intermittent low-volume rectal bleeding that self resolves. Denies any other significant GI symptoms. Last does of Plavix and aspirin morning of 9/5. Denies any routine NSAID use. Occasional alcohol use.  Last colonoscopy 2018 with normal exam, recommended 3-year repeat, currently overdue.  Flex sig in June 2019 with internal hemorrhoids s/p banding.  Differentials include diverticular bleed, hemorrhoidal bleed, and cannot rule out colon polyps or malignancy.  Recommend colonoscopy for further evaluation.   Plan: Continue to hold Plavix.  Last dose morning of 9/5. Clear liquid diet today. Likely colonoscopy with propofol with Dr. Laural Golden tomorrow. The risks, benefits, and alternatives have been discussed with the patient in detail. The patient states understanding and desires to proceed.  Monitor H/H every 6 hours. Transfuse as needed.  Monitor for ongoing overt GI bleeding.   LOS: 0 days    03/19/2021, 9:54 AM   Aliene Altes, PA-C [facility]

## 2021-03-19 NOTE — Progress Notes (Signed)
Pt refused Protonix and Nicotine patch. MD informed.

## 2021-03-19 NOTE — Progress Notes (Signed)
Discharge instructions given pt verbalized understanding with no questions. Discharged patient via wheelchair by private vehicle.

## 2021-03-19 NOTE — Progress Notes (Signed)
Spoke with Dr. Harl Bowie. Patient may discontinue Plavix and continue 81 mg aspirin daily. Hospitalist notified.

## 2021-03-19 NOTE — Discharge Summary (Signed)
Physician Discharge Summary  Duane Adams [id number] DOB: 07/22/1955 DOA: 03/18/2021  PCP: Pcp, No  Admit date: 03/18/2021 Discharge date: 03/19/2021  Time spent: 35 minutes  Recommendations for Outpatient Follow-up:   Rectal bleeding- Lab Results  Component Value Date   HGB 13.3 03/19/2021   HGB 12.4 (L) 03/19/2021   HGB 14.1 03/18/2021   HGB 13.4 03/18/2021   HGB 12.9 (L) 01/22/2021  -9/6 GI Dr.Castaneda Mayorga has cleared patient for discharge. - Has follow-up with PA Aliene Altes, GI in 4 weeks, at which time they will perform EGD.   Essential HTN  CAD Follows with Dr. Harl Bowie.  History of DES 2014.   -Hold home on aspirin and Plavix.    Hypokalemia- -Potassium goal> 4 -K-Dur 40 mEq prior to discharge  Coronary artery disease -stable.     Hypertension- -Stable off of BP medication - PCP or cardiologist will determine when/if to restart BP medication follow-up in 1 to 2 weeks.   Liver cirrhosis -compensated.  At this time no overt stigmata of chronic liver disease.  Discharge Diagnoses:  Principal Problem:   Rectal bleeding Active Problems:   Coronary artery disease   Essential hypertension   PAD (peripheral artery disease) ([place])   Cirrhosis of liver without ascites ([place])   Discharge Condition: Stable  Diet recommendation:   Filed Weights   03/18/21 1453 03/19/21 0641  Weight: 85.7 kg 84.7 kg    History of present illness:  Duane Adams is a 65 y.o. male with medical history significant for liver cirrhosis, coronary artery disease, hypertension. Patient presented to the ED with complaints of multiple episodes of bleeding from his rectum that started yesterday.  Reports at least 10 episodes of days.  Reports chronic intermittent bleeding from his rectum which is mild and self resolves but this time the quantity was a lot and persistent.  Bleeding was without pain.  Reports some mild diffuse abdominal bloating, but no abdominal pain.  Reports  lightheadedness after bleeding.  No chest pain. Reports history of similar bleeding which was secondary to ruptured internal hemorrhoids. Denies NSAID use.  No vomiting of blood.  Hospital Course:  See above    Discharge Exam: Vitals:   03/19/21 0030 03/19/21 0641 03/19/21 0753 03/19/21 1403  BP: 125/71 134/81 (!) 143/69 (!) 133/59  Pulse: 84 83 77 76  Resp: '18 18 20 20  '$ Temp: 98.7 F (37.1 C) 98.1 F (36.7 C)  97.8 F (36.6 C)  TempSrc: Oral Oral  Oral  SpO2: 98% 98% 100% 98%  Weight:  84.7 kg    Height:       Physical Exam:  General: A/O x4, No acute respiratory distress Eyes: negative scleral hemorrhage, negative anisocoria, negative icterus ENT: Negative Runny nose, negative gingival bleeding, Neck:  Negative scars, masses, torticollis, lymphadenopathy, JVD Lungs: Clear to auscultation bilaterally without wheezes or crackles Cardiovascular: Regular rate and rhythm without murmur gallop or rub normal S1 and S2  Discharge Instructions   Allergies as of 03/19/2021       Reactions   Lisinopril Swelling   Swelling neck   Crestor [rosuvastatin] Other (See Comments)   Severe muscle aches   Chantix [varenicline] Other (See Comments)   Abdominal pain   Neosporin [neomycin-bacitracin Zn-polymyx] Rash   blistered        Medication List     STOP taking these medications    amLODipine 10 MG tablet Commonly known as: NORVASC   chlorthalidone 25 MG tablet Commonly known as: HYGROTON  clopidogrel 75 MG tablet Commonly known as: PLAVIX   isosorbide mononitrate 30 MG 24 hr tablet Commonly known as: IMDUR       TAKE these medications    aspirin EC 81 MG tablet Take 81 mg by mouth at bedtime.   atorvastatin 80 MG tablet Commonly known as: LIPITOR Take 1 tablet (80 mg total) by mouth daily.   cloNIDine 0.1 MG tablet Commonly known as: CATAPRES Take 1 tablet (0.1 mg total) by mouth 2 (two) times daily.   metoprolol succinate 50 MG 24 hr  tablet Commonly known as: TOPROL-XL Take 1 tablet (50 mg total) by mouth daily. Take with or immediately following a meal. What changed: Another medication with the same name was removed. Continue taking this medication, and follow the directions you see here.   multivitamin with minerals Tabs tablet Take 1 tablet by mouth daily.   nitroGLYCERIN 0.4 MG SL tablet Commonly known as: Nitrostat Place 1 tablet (0.4 mg total) under the tongue every 5 (five) minutes as needed for chest pain.   Omega 3 1200 MG Caps Take 2,400 mg by mouth daily.   pantoprazole 40 MG tablet Commonly known as: PROTONIX TAKE ONE TABLET BY MOUTH ONCE DAILY.   potassium chloride SA 20 MEQ tablet Commonly known as: KLOR-CON TAKE ONE TABLET BY MOUTH ONCE DAILY.   Vitamin D3 50 MCG (2000 UT) Tabs Take 4,000 Units by mouth 2 (two) times daily.       Allergies  Allergen Reactions   Lisinopril Swelling    Swelling neck   Crestor [Rosuvastatin] Other (See Comments)    Severe muscle aches   Chantix [Varenicline] Other (See Comments)    Abdominal pain   Neosporin [Neomycin-Bacitracin Zn-Polymyx] Rash    blistered      The results of significant diagnostics from this hospitalization (including imaging, microbiology, ancillary and laboratory) are listed below for reference.    Significant Diagnostic Studies: No results found.  Microbiology: Recent Results (from the past 240 hour(s))  Resp Panel by RT-PCR (Flu A&B, Covid) Nasopharyngeal Swab     Status: None   Collection Time: 03/18/21  5:00 PM   Specimen: Nasopharyngeal Swab; Nasopharyngeal(NP) swabs in vial transport medium  Result Value Ref Range Status   SARS Coronavirus 2 by RT PCR NEGATIVE NEGATIVE Final    Comment: (NOTE) SARS-CoV-2 target nucleic acids are NOT DETECTED.  The SARS-CoV-2 RNA is generally detectable in upper respiratory specimens during the acute phase of infection. The lowest concentration of SARS-CoV-2 viral copies this assay  can detect is 138 copies/mL. A negative result does not preclude SARS-Cov-2 infection and should not be used as the sole basis for treatment or other patient management decisions. A negative result may occur with  improper specimen collection/handling, submission of specimen other than nasopharyngeal swab, presence of viral mutation(s) within the areas targeted by this assay, and inadequate number of viral copies(<138 copies/mL). A negative result must be combined with clinical observations, patient history, and epidemiological information. The expected result is Negative.  Fact Sheet for Patients:  EntrepreneurPulse.com.au  Fact Sheet for Healthcare Providers:  IncredibleEmployment.be  This test is no t yet approved or cleared by the [place] FDA and  has been authorized for detection and/or diagnosis of SARS-CoV-2 by FDA under an Emergency Use Authorization (EUA). This EUA will remain  in effect (meaning this test can be used) for the duration of the COVID-19 declaration under Section 564(b)(1) of the Act, 21 U.S.C.section 360bbb-3(b)(1), unless the authorization is terminated  or revoked sooner.       Influenza A by PCR NEGATIVE NEGATIVE Final   Influenza B by PCR NEGATIVE NEGATIVE Final    Comment: (NOTE) The Xpert Xpress SARS-CoV-2/FLU/RSV plus assay is intended as an aid in the diagnosis of influenza from Nasopharyngeal swab specimens and should not be used as a sole basis for treatment. Nasal washings and aspirates are unacceptable for Xpert Xpress SARS-CoV-2/FLU/RSV testing.  Fact Sheet for Patients: EntrepreneurPulse.com.au  Fact Sheet for Healthcare Providers: IncredibleEmployment.be  This test is not yet approved or cleared by the [place] FDA and has been authorized for detection and/or diagnosis of SARS-CoV-2 by FDA under an Emergency Use Authorization (EUA). This EUA will  remain in effect (meaning this test can be used) for the duration of the COVID-19 declaration under Section 564(b)(1) of the Act, 21 U.S.C. section 360bbb-3(b)(1), unless the authorization is terminated or revoked.  Performed at [facility], [address], [place], [place] [address]      Labs: Basic Metabolic Panel: Recent Labs  Lab 03/18/21 1508 03/18/21 2019 03/19/21 0518  NA 137  --  139  K 3.3*  --  3.7  CL 103  --  104  CO2 27  --  28  GLUCOSE 125*  --  111*  BUN 13  --  12  CREATININE 0.74  --  0.71  CALCIUM 9.2  --  8.6*  MG  --  1.8  --    Liver Function Tests: Recent Labs  Lab 03/18/21 1508  AST 28  ALT 39  ALKPHOS 48  BILITOT 0.5  PROT 7.4  ALBUMIN 4.4   No results for input(s): LIPASE, AMYLASE in the last 168 hours. No results for input(s): AMMONIA in the last 168 hours. CBC: Recent Labs  Lab 03/18/21 1508 03/18/21 2019 03/19/21 0518 03/19/21 1054  WBC 7.1 8.5 7.9  --   HGB 13.4 14.1 12.4* 13.3  HCT 38.9* 41.4 36.8* 39.0  MCV 95.6 93.5 95.8  --   PLT 202 217 189  --    Cardiac Enzymes: No results for input(s): CKTOTAL, CKMB, CKMBINDEX, TROPONINI in the last 168 hours. BNP: BNP (last 3 results) No results for input(s): BNP in the last 8760 hours.  ProBNP (last 3 results) No results for input(s): PROBNP in the last 8760 hours.  CBG: No results for input(s): GLUCAP in the last 168 hours.     Signed:  Dia Crawford, MD [facility]

## 2021-03-19 NOTE — Telephone Encounter (Signed)
Patient was discharged from the hospital today. Admitted with painless rectal bleeding on Plavix and aspirin, possibly secondary to hemorrhoids. With no significant decline in Hg, recommended arranging outpatient colonoscopy. Discussed with Dr. Harl Bowie, and Plavix was discontinued as PCI was greater than 1 year ago. Remains on 81 mg aspirin.   Patient already has follow-up with me on 10/6. Would like to try to get colonoscopy arranged prior to this if we are able.   [facility] Clinical Pool: Please arrange colonoscopy with propofol with Dr. Abbey Chatters ASAP. Preferably next week if able. Do not schedule this week as his last dose of Plavix was morning of 9/5.  Please verify with patient when you schedule him that he is no longer taking Plavix. This was to be discontinued indefinitely at discharge.   ASA III  Dena: Please arrange CBC to be 03/21/21.

## 2021-03-20 ENCOUNTER — Encounter: Payer: Self-pay | Admitting: *Deleted

## 2021-03-20 ENCOUNTER — Ambulatory Visit (INDEPENDENT_AMBULATORY_CARE_PROVIDER_SITE_OTHER): Payer: Medicaid Other | Admitting: Nurse Practitioner

## 2021-03-20 ENCOUNTER — Other Ambulatory Visit: Payer: Self-pay

## 2021-03-20 ENCOUNTER — Telehealth: Payer: Self-pay

## 2021-03-20 DIAGNOSIS — K625 Hemorrhage of anus and rectum: Secondary | ICD-10-CM

## 2021-03-20 NOTE — Telephone Encounter (Signed)
Transition Care Management Follow-up Telephone Call Date of discharge and from where: 03/19/2021-[facility]  How have you been since you were released from the hospital? Patient stated he is doing a lot better.  Any questions or concerns? No  Items Reviewed: Did the pt receive and understand the discharge instructions provided? Yes  Medications obtained and verified?  No medications given at discharge.  Other? No  Any new allergies since your discharge? No  Dietary orders reviewed? N/A Do you have support at home? Yes   Home Care and Equipment/Supplies: Were home health services ordered? not applicable If so, what is the name of the agency? N/A  Has the agency set up a time to come to the patient's home? not applicable Were any new equipment or medical supplies ordered?  No What is the name of the medical supply agency? N/A Were you able to get the supplies/equipment? not applicable Do you have any questions related to the use of the equipment or supplies? No  Functional Questionnaire: (I = Independent and D = Dependent) ADLs: I  Bathing/Dressing- I  Meal Prep- I  Eating- II  Maintaining continence- I  Transferring/Ambulation- I  Managing Meds- I  Follow up appointments reviewed:  PCP Hospital f/u appt confirmed? No   Specialist Hospital f/u appt confirmed? Yes  Scheduled to see Dr, Abbey Chatters on 04/01/2021 @ 1:15pm. Are transportation arrangements needed? No  If their condition worsens, is the pt aware to call PCP or go to the Emergency Dept.? Yes Was the patient provided with contact information for the PCP's office or ED? Yes Was to pt encouraged to call back with questions or concerns? Yes

## 2021-03-20 NOTE — Telephone Encounter (Signed)
Communication noted.  

## 2021-03-20 NOTE — Telephone Encounter (Signed)
Spoke with pt. He has been scheduled for 9/19 at 1:15pm. He will come by the office and pick up prep and instructions. Will leave at front desk. Also confirmed he is not taking the plavix any longer. I did offer dates next week but declined.

## 2021-03-20 NOTE — Telephone Encounter (Signed)
Phoned and spoke with the pt and advised of labs needing to be drawn at [facility] (CBC). The pt will go tomorrow

## 2021-03-21 DIAGNOSIS — K625 Hemorrhage of anus and rectum: Secondary | ICD-10-CM | POA: Diagnosis not present

## 2021-03-21 LAB — CBC WITH DIFFERENTIAL/PLATELET
Absolute Monocytes: 624 cells/uL (ref 200–950)
Basophils Absolute: 49 cells/uL (ref 0–200)
Basophils Relative: 0.6 %
Eosinophils Absolute: 203 cells/uL (ref 15–500)
Eosinophils Relative: 2.5 %
HCT: 38.1 % — ABNORMAL LOW (ref 38.5–50.0)
Hemoglobin: 12.9 g/dL — ABNORMAL LOW (ref 13.2–17.1)
Lymphs Abs: 2778 cells/uL (ref 850–3900)
MCH: 31.4 pg (ref 27.0–33.0)
MCHC: 33.9 g/dL (ref 32.0–36.0)
MCV: 92.7 fL (ref 80.0–100.0)
MPV: 10.4 fL (ref 7.5–12.5)
Monocytes Relative: 7.7 %
Neutro Abs: 4447 cells/uL (ref 1500–7800)
Neutrophils Relative %: 54.9 %
Platelets: 222 10*3/uL (ref 140–400)
RBC: 4.11 10*6/uL — ABNORMAL LOW (ref 4.20–5.80)
RDW: 13.2 % (ref 11.0–15.0)
Total Lymphocyte: 34.3 %
WBC: 8.1 10*3/uL (ref 3.8–10.8)

## 2021-03-26 NOTE — Patient Instructions (Signed)
Duane Adams  03/26/2021     '@PREFPERIOPPHARMACY'$ @   Your procedure is scheduled on  04/01/2021.   Report to [name] at  1115  A.M.   Call this number if you have problems the morning of surgery:  [phone]   Remember:  Follow the diet and prep instructions given to you by the office.    Take these medicines the morning of surgery with A SIP OF WATER                      catapress, metoprolol, protonix.     Do not wear jewelry, make-up or nail polish.  Do not wear lotions, powders, or perfumes, or deodorant.  Do not shave 48 hours prior to surgery.  Men may shave face and neck.  Do not bring valuables to the hospital.  [facility] is not responsible for any belongings or valuables.  Contacts, dentures or bridgework may not be worn into surgery.  Leave your suitcase in the car.  After surgery it may be brought to your room.  For patients admitted to the hospital, discharge time will be determined by your treatment team.  Patients discharged the day of surgery will not be allowed to drive home and must have someone with them for 24 hours.    Special instructions:   DO NOT smoke tobacco or vape for 24 hours before your procedure.  Please read over the following fact sheets that you were given. Anesthesia Post-op Instructions and Care and Recovery After Surgery      Colonoscopy, Adult, Care After This sheet gives you information about how to care for yourself after your procedure. Your health care provider may also give you more specific instructions. If you have problems or questions, contact your health care provider. What can I expect after the procedure? After the procedure, it is common to have: A small amount of blood in your stool for 24 hours after the procedure. Some gas. Mild cramping or bloating of your abdomen. Follow these instructions at home: Eating and drinking  Drink enough fluid to keep your urine pale yellow. Follow instructions  from your health care provider about eating or drinking restrictions. Resume your normal diet as instructed by your health care provider. Avoid heavy or fried foods that are hard to digest. Activity Rest as told by your health care provider. Avoid sitting for a long time without moving. Get up to take short walks every 1-2 hours. This is important to improve blood flow and breathing. Ask for help if you feel weak or unsteady. Return to your normal activities as told by your health care provider. Ask your health care provider what activities are safe for you. Managing cramping and bloating  Try walking around when you have cramps or feel bloated. Apply heat to your abdomen as told by your health care provider. Use the heat source that your health care provider recommends, such as a moist heat pack or a heating pad. Place a towel between your skin and the heat source. Leave the heat on for 20-30 minutes. Remove the heat if your skin turns bright red. This is especially important if you are unable to feel pain, heat, or cold. You may have a greater risk of getting burned. General instructions If you were given a sedative during the procedure, it can affect you for several hours. Do not drive or operate machinery until your health care provider says that it  is safe. For the first 24 hours after the procedure: Do not sign important documents. Do not drink alcohol. Do your regular daily activities at a slower pace than normal. Eat soft foods that are easy to digest. Take over-the-counter and prescription medicines only as told by your health care provider. Keep all follow-up visits as told by your health care provider. This is important. Contact a health care provider if: You have blood in your stool 2-3 days after the procedure. Get help right away if you have: More than a small spotting of blood in your stool. Large blood clots in your stool. Swelling of your abdomen. Nausea or vomiting. A  fever. Increasing pain in your abdomen that is not relieved with medicine. Summary After the procedure, it is common to have a small amount of blood in your stool. You may also have mild cramping and bloating of your abdomen. If you were given a sedative during the procedure, it can affect you for several hours. Do not drive or operate machinery until your health care provider says that it is safe. Get help right away if you have a lot of blood in your stool, nausea or vomiting, a fever, or increased pain in your abdomen. This information is not intended to replace advice given to you by your health care provider. Make sure you discuss any questions you have with your health care provider. Document Revised: 06/24/2019 Document Reviewed: 01/24/2019 Elsevier Patient Education  [place] After This sheet gives you information about how to care for yourself after your procedure. Your health care provider may also give you more specific instructions. If you have problems or questions, contact your health care provider. What can I expect after the procedure? After the procedure, it is common to have: Tiredness. Forgetfulness about what happened after the procedure. Impaired judgment for important decisions. Nausea or vomiting. Some difficulty with balance. Follow these instructions at home: For the time period you were told by your health care provider:   Rest as needed. Do not participate in activities where you could fall or become injured. Do not drive or use machinery. Do not drink alcohol. Do not take sleeping pills or medicines that cause drowsiness. Do not make important decisions or sign legal documents. Do not take care of children on your own. Eating and drinking Follow the diet that is recommended by your health care provider. Drink enough fluid to keep your urine pale yellow. If you vomit: Drink water, juice, or soup when you can drink  without vomiting. Make sure you have little or no nausea before eating solid foods. General instructions Have a responsible adult stay with you for the time you are told. It is important to have someone help care for you until you are awake and alert. Take over-the-counter and prescription medicines only as told by your health care provider. If you have sleep apnea, surgery and certain medicines can increase your risk for breathing problems. Follow instructions from your health care provider about wearing your sleep device: Anytime you are sleeping, including during daytime naps. While taking prescription pain medicines, sleeping medicines, or medicines that make you drowsy. Avoid smoking. Keep all follow-up visits as told by your health care provider. This is important. Contact a health care provider if: You keep feeling nauseous or you keep vomiting. You feel light-headed. You are still sleepy or having trouble with balance after 24 hours. You develop a rash. You have a fever. You have redness  or swelling around the IV site. Get help right away if: You have trouble breathing. You have new-onset confusion at home. Summary For several hours after your procedure, you may feel tired. You may also be forgetful and have poor judgment. Have a responsible adult stay with you for the time you are told. It is important to have someone help care for you until you are awake and alert. Rest as told. Do not drive or operate machinery. Do not drink alcohol or take sleeping pills. Get help right away if you have trouble breathing, or if you suddenly become confused. This information is not intended to replace advice given to you by your health care provider. Make sure you discuss any questions you have with your health care provider. Document Revised: 03/15/2020 Document Reviewed: 06/02/2019 Elsevier Patient Education  2022 [employer].

## 2021-03-28 ENCOUNTER — Encounter (HOSPITAL_COMMUNITY): Payer: Self-pay

## 2021-03-28 ENCOUNTER — Encounter (HOSPITAL_COMMUNITY)
Admission: RE | Admit: 2021-03-28 | Discharge: 2021-03-28 | Disposition: A | Discharge status: Home / Self Care | Payer: Medicaid Other | Source: Ambulatory Visit | Attending: Internal Medicine | Admitting: Internal Medicine

## 2021-03-28 ENCOUNTER — Other Ambulatory Visit: Payer: Self-pay

## 2021-03-29 ENCOUNTER — Encounter: Payer: Self-pay | Admitting: Student

## 2021-03-29 ENCOUNTER — Telehealth: Payer: Self-pay

## 2021-03-29 ENCOUNTER — Ambulatory Visit: Payer: Medicaid Other | Admitting: Student

## 2021-03-29 VITALS — BP 130/68 | HR 79 | Ht 69.0 in | Wt 188.0 lb

## 2021-03-29 DIAGNOSIS — I25118 Atherosclerotic heart disease of native coronary artery with other forms of angina pectoris: Secondary | ICD-10-CM | POA: Diagnosis not present

## 2021-03-29 DIAGNOSIS — E785 Hyperlipidemia, unspecified: Secondary | ICD-10-CM | POA: Diagnosis not present

## 2021-03-29 DIAGNOSIS — K625 Hemorrhage of anus and rectum: Secondary | ICD-10-CM

## 2021-03-29 DIAGNOSIS — I10 Essential (primary) hypertension: Secondary | ICD-10-CM | POA: Diagnosis not present

## 2021-03-29 DIAGNOSIS — I739 Peripheral vascular disease, unspecified: Secondary | ICD-10-CM

## 2021-03-29 MED ORDER — AMLODIPINE BESYLATE 10 MG PO TABS: 10.0000 mg | ORAL_TABLET | Freq: Every day | ORAL | 3 refills | Status: DC

## 2021-03-29 MED ORDER — METOPROLOL SUCCINATE ER 50 MG PO TB24: 50.0000 mg | ORAL_TABLET | Freq: Every day | ORAL | 3 refills | Status: DC

## 2021-03-29 MED ORDER — ISOSORBIDE MONONITRATE ER 30 MG PO TB24: 30.0000 mg | ORAL_TABLET | Freq: Every day | ORAL | 3 refills | Status: DC

## 2021-03-29 MED ORDER — CHLORTHALIDONE 25 MG PO TABS: 25.0000 mg | ORAL_TABLET | Freq: Every day | ORAL | 3 refills | Status: DC

## 2021-03-29 MED ORDER — METOPROLOL SUCCINATE ER 100 MG PO TB24: 100.0000 mg | ORAL_TABLET | Freq: Every day | ORAL | 3 refills | Status: DC

## 2021-03-29 NOTE — Progress Notes (Signed)
Cardiology Office Note    Date:  03/29/2021   ID:  Duane Adams, DOB [birth year], MRN [id number]  PCP:  Pcp, No  Cardiologist: Carlyle Dolly, MD    Chief Complaint  Patient presents with   Follow-up    1 month visit    History of Present Illness:    Duane Adams is a 65 y.o. male with past medical history of CAD (s/p DES to LAD and DES to RCA in 03/2013, patent stents by repeat cath in 08/2016 and 01/2021), HTN, HLD, PAD (with known left subclavian stenosis) and tobacco use who presents to the office today for 1 month follow-up.  He was most recently examined by Dr. Harl Bowie in 02/2021 and reported still having intermittent episodes of chest pain which would occur with activity along with associated fatigue. It was felt that his symptoms could possibly be secondary to microvascular disease or vasospasm given his recent reassuring cath, therefore was recommended to continue with titration of antianginal therapy and Amlodipine was increased to 10 mg daily.   He was admitted to [facility] from 9/5 - 03/19/2021 for evaluation of rectal bleeding which had started to intensify in frequency and severity. His hemoglobin remained stable during admission and it was recommended that he have an outpatient colonoscopy. His case was reviewed with Dr. Harl Bowie during admission and it was recommended that he discontinue Plavix at that time given no recent stents and continue on ASA 81 mg daily. For unclear reasons, his Amlodipine, Imdur and Chlorthalidone were discontinued at discharge (possibly secondary to hypotension but BP was at 133/59 on discharge).   In talking with the patient today, he reports overall feeling well since his recent hospitalization.  He denies any recurrent rectal bleeding. He is scheduled for a colonoscopy next week. He reports that he did continue on Amlodipine, Imdur and Chlorthalidone and did not stop the medications following hospital discharge. He denies any recurrent chest  pain and says his symptoms resolved after Amlodipine was titrated at his last visit. He does have baseline dyspnea on exertion which has been unchanged. No recent orthopnea, PND or pitting edema.   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   Concussion several yrs ago   no residual   Coronary artery disease    a. 03/2013 Cath/PCI: LM nl, LAD 87m(2.5x20 Promus Premier DES), LCX min irregs, RCA dom, 90p(3.0x20 Promus Premier DES), EF 55-65%.   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 Needs colonoscopy in 1 year.    COLONOSCOPY WITH PROPOFOL N/A 12/19/2014   Dr. Oneida Alar: six simple adenomas and 3 hyperplastic polyps. Had flat mid-transverse colon polyp and referred to [facility] for endoscopic mucosal resection, which is scheduled for July 22   CORONARY ANGIOPLASTY WITH STENT PLACEMENT  04/11/2013   LAD &  RCA     DR COOPER   ESOPHAGOGASTRODUODENOSCOPY (EGD)  WITH PROPOFOL N/A 12/19/2014   Dr. Oneida Alar:  without varices, small hiatal hernia noted, moderate non-erosive gastritis and mild duodenitis.  POSITIVE H.PYLORI. Prescribed Prevpac.   EXCISION MORTON'S NEUROMA Left 08/15/2020   Procedure: EXCISION MORTON'S NEUROMA THIRD INTERSPACE LEFT FOOT;  Surgeon: [name], DPM;  Location: [place];  Service: Podiatry;  Laterality: Left;   EXTRACORPOREAL SHOCK WAVE LITHOTRIPSY  2001   FLEXIBLE SIGMOIDOSCOPY N/A 12/23/2017   Procedure: FLEXIBLE SIGMOIDOSCOPY;  Surgeon: Danie Binder, MD;  [facility];  Service: Endoscopy;  Laterality: N/A;  1:45pm   HAND RECONSTRUCTION Left    HEMORRHOID BANDING N/A 12/23/2017   Procedure: HEMORRHOID BANDING;  Surgeon: Danie Binder, MD;  [facility];  Service: Endoscopy;  Laterality: N/A;   LEFT HEART CATH AND CORONARY ANGIOGRAPHY N/A 01/22/2021   Procedure: LEFT HEART CATH AND CORONARY ANGIOGRAPHY;  Surgeon: Martinique, Peter M, MD;  Location: [place] [facility];  Service: Cardiovascular;  Laterality: N/A;   LEFT HEART CATHETERIZATION WITH CORONARY ANGIOGRAM N/A 04/11/2013   Procedure: LEFT HEART CATHETERIZATION WITH CORONARY ANGIOGRAM;  Surgeon: Blane Ohara, MD;  Location: [facility];  Service: Cardiovascular;  Laterality: N/A;   LEG TENDON SURGERY Right    POLYPECTOMY N/A 12/19/2014   Procedure: POLYPECTOMY;  Surgeon: Danie Binder, MD;  Location: [facility];  Service: Endoscopy;  Laterality: N/A;    Current Medications: Outpatient Medications Prior to Visit  Medication Sig Dispense Refill   aspirin EC 81 MG tablet Take 81 mg by mouth at bedtime.      atorvastatin (LIPITOR) 80 MG tablet Take 1 tablet (80 mg total) by mouth daily. 90 tablet 3   Cholecalciferol (VITAMIN D3) 2000 units TABS Take 4,000 Units by mouth 2 (two) times daily.     cloNIDine (CATAPRES) 0.1 MG tablet Take 1 tablet (0.1 mg total) by mouth 2 (two) times daily. 180 tablet 1   Multiple Vitamin (MULTIVITAMIN WITH  MINERALS) TABS tablet Take 1 tablet by mouth daily.     nitroGLYCERIN (NITROSTAT) 0.4 MG SL tablet Place 1 tablet (0.4 mg total) under the tongue every 5 (five) minutes as needed for chest pain. 25 tablet 3   Omega 3 1200 MG CAPS Take 2,400 mg by mouth daily.     pantoprazole (PROTONIX) 40 MG tablet TAKE ONE TABLET BY MOUTH ONCE DAILY. (Patient taking differently: Take 40 mg by mouth daily.) 90 tablet 3   potassium chloride SA (KLOR-CON) 20 MEQ tablet TAKE ONE TABLET BY MOUTH ONCE DAILY. (Patient taking differently: Take 20 mEq by mouth daily.) 90 tablet 3   metoprolol succinate (TOPROL-XL) 50 MG 24 hr tablet Take 1 tablet (50 mg total) by mouth daily. Take with or immediately following a meal. (Patient taking differently: Take 50-100 mg by mouth See admin instructions. Take with or immediately following a meal.  100 mg in the morning, 50 mg at bedtime) 90 tablet 3  No facility-administered medications prior to visit.     Allergies:   Lisinopril, Crestor [rosuvastatin], Chantix [varenicline], and Neosporin [neomycin-bacitracin zn-polymyx]   Social History   Socioeconomic History   Marital status: Soil scientist    Spouse name: Tisha   Number of children: 2   Years of education: 14   Highest education level: Not on file  Occupational History   Occupation: disabled    Comment: heart  Tobacco Use   Smoking status: Light Smoker    Packs/day: 0.50    Years: 40.00    Pack years: 20.00    Types: Cigarettes    Start date: 07/14/1968   Smokeless tobacco: Never   Tobacco comments:    5-6 Cigarettes per day  Vaping Use   Vaping Use: Never used  Substance and Sexual Activity   Alcohol use: Yes    Comment: 4 beer every 2 weeks.   Drug use: Not Currently    Types: Cocaine, Marijuana    Comment: cocaine marijuana and acid last used 1984   Sexual activity: Not Currently  Other Topics Concern   Not on file  Social History Narrative   Disabled from heart disease   Previously worked in  Scientist, research (medical) with Neurosurgeon for 28 years   Leisure: Teaching laboratory technician   Social Determinants of Radio broadcast assistant Strain: Not on file  Food Insecurity: Not on file  Transportation Needs: Not on file  Physical Activity: Not on file  Stress: Not on file  Social Connections: Not on file     Family History:  The patient's family history includes Alcohol abuse in his brother; Aneurysm in his mother; Diabetes in his paternal aunt; Heart disease in his brother and father; Hypertension in his brother, father, and mother; Stroke in his mother.   Review of Systems:    Please see the history of present illness.     All other systems reviewed and are otherwise negative except as noted above.   Physical Exam:    VS:  BP 130/68   Pulse 79   Ht '5\' 9"'$  (1.753 m)   Wt 188 lb (85.3 kg)   SpO2 96%   BMI 27.76 kg/m    General: Well developed, well nourished,male appearing in no acute distress. Head: Normocephalic, atraumatic. Neck: No carotid bruits. JVD not elevated.  Lungs: Respirations regular and unlabored, without wheezes or rales.  Heart: Regular rate and rhythm. No S3 or S4.  No murmur, no rubs, or gallops appreciated. Abdomen: Appears non-distended. No obvious abdominal masses. Msk:  Strength and tone appear normal for age. No obvious joint deformities or effusions. Extremities: No clubbing or cyanosis. Trace ankle edema bilaterally but no pitting edema.  Distal pedal pulses are 2+ bilaterally. Neuro: Alert and oriented X 3. Moves all extremities spontaneously. No focal deficits noted. Psych:  Responds to questions appropriately with a normal affect. Skin: No rashes or lesions noted  Wt Readings from Last 3 Encounters:  03/29/21 188 lb (85.3 kg)  03/28/21 (P) 186 lb 11 oz (84.7 kg)  03/19/21 186 lb 11.2 oz (84.7 kg)     Studies/Labs Reviewed:   EKG:  EKG is not ordered today.   Recent Labs: 03/18/2021: ALT 39; Magnesium 1.8 03/19/2021: BUN 12; Creatinine, Ser 0.71; Potassium  3.7; Sodium 139 03/21/2021: Hemoglobin 12.9; Platelets 222   Lipid Panel    Component Value Date/Time   CHOL 225 (H) 12/17/2020 1458   TRIG 931 (H) 12/17/2020 1458   HDL 46 12/17/2020 1458  CHOLHDL 4.9 12/17/2020 1458   VLDL UNABLE TO CALCULATE IF TRIGLYCERIDE OVER 400 mg/dL 02/01/2019 0425   LDLCALC  12/17/2020 1458     Comment:     . LDL cholesterol not calculated. Triglyceride levels greater than 400 mg/dL invalidate calculated LDL results. . Reference range: <100 . Desirable range <100 mg/dL for primary prevention;   <70 mg/dL for patients with CHD or diabetic patients  with > or = 2 CHD risk factors. Marland Kitchen LDL-C is now calculated using the Martin-Hopkins  calculation, which is a validated novel method providing  better accuracy than the Friedewald equation in the  estimation of LDL-C.  Cresenciano Genre et al. Annamaria Helling. [id number]): 2061-2068  (http://education.Quest[employer]ostics.com/faq/FAQ164)    LDLDIRECT 100.2 (H) 02/01/2019 0425    Additional studies/ records that were reviewed today include:   Cardiac Catheterization: 01/22/2021 Previously placed Prox LAD to Mid LAD stent (unknown type) is widely patent. 1st Diag lesion is 35% stenosed. Mid LAD lesion is 25% stenosed. Ost Cx to Prox Cx lesion is 40% stenosed. Ost RCA to Prox RCA lesion is 10% stenosed. Prox RCA lesion is 30% stenosed. The left ventricular systolic function is normal. LV end diastolic pressure is normal. The left ventricular ejection fraction is 55-65% by visual estimate.   1. Nonobstructive CAD. Excellent patency of stents in the LAD and RCA 2. Normal LV function 3. Normal LV EDP   Plan: continue medical therapy. Consider other noncardiac causes for symptoms.  Assessment:    1. Coronary artery disease of native artery of native heart with stable angina pectoris ([place])   2. Essential hypertension   3. Hyperlipidemia LDL goal <70   4. Rectal bleeding   5. PAD (peripheral artery disease) ([place])       Plan:   In order of problems listed above:  1. CAD - He is s/p DES to LAD and DES to RCA in 03/2013 with patent stents by repeat cath in 08/2016 and 01/2021.  - He denies any recurrent chest pain following titration of his antianginal medications. Continue current medication regimen with ASA, Atorvastatin, Imdur, Amlodipine and Toprol-XL. Plavix was discontinued by Dr. Harl Bowie during his recent admission given his rectal bleeding and no recent cardiac intervention.  2. HTN - His blood pressure is well controlled at 130/68 during today's visit. Continue current medication regimen with Amlodipine, Chlorthalidone, Imdur, Clonidine and Toprol-XL.  3. HLD - FLP in 12/2020 showed his total cholesterol was at 225, triglycerides 931 and non-HDL 179 with Atorvastatin being titrated from 40 mg daily to 80 mg daily. Will recheck FLP and LFT's within the next month.   4. Rectal Bleeding - He is being followed by GI with plans for a colonoscopy next week. Denies any recurrent bleeding since his hospital admission.  5. PAD - He has known left subclavian stenosis and infrainguinal arterial disease which is followed by Dr. Doren Custard with Vascular Surgery. He remains on ASA 81 mg daily and statin therapy. No longer on Plavix given his rectal bleeding as outlined above.   Medication Adjustments/Labs and Tests Ordered: Current medicines are reviewed at length with the patient today.  Concerns regarding medicines are outlined above.  Medication changes, Labs and Tests ordered today are listed in the Patient Instructions below. Patient Instructions  Medication Instructions:  Your physician recommends that you continue on your current medications as directed. Please refer to the Current Medication list given to you today.  *If you need a refill on your cardiac medications before your next appointment, please call  your pharmacy*   Lab Work: FLP/LFT- FASTING  If you have labs (blood work) drawn today and  your tests are completely normal, you will receive your results only by: [place] (if you have MyChart) OR A paper copy in the mail If you have any lab test that is abnormal or we need to change your treatment, we will call you to review the results.   Testing/Procedures: None   Follow-Up: At [facility], you and your health needs are our priority.  As part of our continuing mission to provide you with exceptional heart care, we have created designated Provider Care Teams.  These Care Teams include your primary Cardiologist (physician) and Advanced Practice Providers (APPs -  Physician Assistants and Nurse Practitioners) who all work together to provide you with the care you need, when you need it.  We recommend signing up for the patient portal called "MyChart".  Sign up information is provided on this After Visit Summary.  MyChart is used to connect with patients for Virtual Visits (Telemedicine).  Patients are able to view lab/test results, encounter notes, upcoming appointments, etc.  Non-urgent messages can be sent to your provider as well.   To learn more about what you can do with MyChart, go to [email or web address].    Your next appointment:   6 month(s)  The format for your next appointment:   In Person  Provider:   You may see Carlyle Dolly, MD or one of the following Advanced Practice Providers on your designated Care Team:   Bernerd Pho, PA-C     Other Instructions     Signed, Waynetta Pean  03/29/2021 4:36 PM    [facility]. [address] [place], [place] [address] Phone: [phone] Fax: [phone]

## 2021-03-29 NOTE — Telephone Encounter (Signed)
Called pt, TCS for 04/01/21 moved up to 10:30am. Advised him to arrive at 9:00am.Start drinking 2nd half of prep at 5:30am. NPO at 7:30am. Endo scheduler informed.

## 2021-03-29 NOTE — Patient Instructions (Addendum)
Medication Instructions:  Your physician recommends that you continue on your current medications as directed. Please refer to the Current Medication list given to you today.  *If you need a refill on your cardiac medications before your next appointment, please call your pharmacy*   Lab Work: FLP/LFT- FASTING  If you have labs (blood work) drawn today and your tests are completely normal, you will receive your results only by: [place] (if you have MyChart) OR A paper copy in the mail If you have any lab test that is abnormal or we need to change your treatment, we will call you to review the results.   Testing/Procedures: None   Follow-Up: At [facility], you and your health needs are our priority.  As part of our continuing mission to provide you with exceptional heart care, we have created designated Provider Care Teams.  These Care Teams include your primary Cardiologist (physician) and Advanced Practice Providers (APPs -  Physician Assistants and Nurse Practitioners) who all work together to provide you with the care you need, when you need it.  We recommend signing up for the patient portal called "MyChart".  Sign up information is provided on this After Visit Summary.  MyChart is used to connect with patients for Virtual Visits (Telemedicine).  Patients are able to view lab/test results, encounter notes, upcoming appointments, etc.  Non-urgent messages can be sent to your provider as well.   To learn more about what you can do with MyChart, go to [email or web address].    Your next appointment:   6 month(s)  The format for your next appointment:   In Person  Provider:   You may see Carlyle Dolly, MD or one of the following Advanced Practice Providers on your designated Care Team:   Bernerd Pho, [place]     Other Instructions

## 2021-04-01 ENCOUNTER — Encounter (HOSPITAL_COMMUNITY): Payer: Self-pay

## 2021-04-01 ENCOUNTER — Other Ambulatory Visit: Payer: Self-pay

## 2021-04-01 ENCOUNTER — Encounter (HOSPITAL_COMMUNITY)
Admission: RE | Disposition: A | Discharge status: Home / Self Care | Payer: Self-pay | Source: Home / Self Care | Attending: Internal Medicine

## 2021-04-01 ENCOUNTER — Ambulatory Visit (HOSPITAL_COMMUNITY)
Admission: RE | Admit: 2021-04-01 | Discharge: 2021-04-01 | Disposition: A | Discharge status: Home / Self Care | Payer: Medicaid Other | Attending: Internal Medicine | Admitting: Internal Medicine

## 2021-04-01 ENCOUNTER — Ambulatory Visit (HOSPITAL_COMMUNITY): Payer: Medicaid Other | Admitting: Certified Registered"

## 2021-04-01 DIAGNOSIS — I2510 Atherosclerotic heart disease of native coronary artery without angina pectoris: Secondary | ICD-10-CM | POA: Diagnosis not present

## 2021-04-01 DIAGNOSIS — Z888 Allergy status to other drugs, medicaments and biological substances status: Secondary | ICD-10-CM | POA: Diagnosis not present

## 2021-04-01 DIAGNOSIS — Z7902 Long term (current) use of antithrombotics/antiplatelets: Secondary | ICD-10-CM | POA: Diagnosis not present

## 2021-04-01 DIAGNOSIS — Z8249 Family history of ischemic heart disease and other diseases of the circulatory system: Secondary | ICD-10-CM | POA: Diagnosis not present

## 2021-04-01 DIAGNOSIS — Z7982 Long term (current) use of aspirin: Secondary | ICD-10-CM | POA: Insufficient documentation

## 2021-04-01 DIAGNOSIS — Z8619 Personal history of other infectious and parasitic diseases: Secondary | ICD-10-CM | POA: Diagnosis not present

## 2021-04-01 DIAGNOSIS — Z79899 Other long term (current) drug therapy: Secondary | ICD-10-CM | POA: Insufficient documentation

## 2021-04-01 DIAGNOSIS — Z86010 Personal history of colonic polyps: Secondary | ICD-10-CM | POA: Insufficient documentation

## 2021-04-01 DIAGNOSIS — F17210 Nicotine dependence, cigarettes, uncomplicated: Secondary | ICD-10-CM | POA: Insufficient documentation

## 2021-04-01 DIAGNOSIS — Z955 Presence of coronary angioplasty implant and graft: Secondary | ICD-10-CM | POA: Diagnosis not present

## 2021-04-01 DIAGNOSIS — K219 Gastro-esophageal reflux disease without esophagitis: Secondary | ICD-10-CM | POA: Insufficient documentation

## 2021-04-01 DIAGNOSIS — K5730 Diverticulosis of large intestine without perforation or abscess without bleeding: Secondary | ICD-10-CM | POA: Diagnosis not present

## 2021-04-01 DIAGNOSIS — Z833 Family history of diabetes mellitus: Secondary | ICD-10-CM | POA: Diagnosis not present

## 2021-04-01 DIAGNOSIS — Z881 Allergy status to other antibiotic agents status: Secondary | ICD-10-CM | POA: Insufficient documentation

## 2021-04-01 DIAGNOSIS — K648 Other hemorrhoids: Secondary | ICD-10-CM | POA: Diagnosis not present

## 2021-04-01 DIAGNOSIS — Z8673 Personal history of transient ischemic attack (TIA), and cerebral infarction without residual deficits: Secondary | ICD-10-CM | POA: Insufficient documentation

## 2021-04-01 DIAGNOSIS — K625 Hemorrhage of anus and rectum: Secondary | ICD-10-CM

## 2021-04-01 DIAGNOSIS — I10 Essential (primary) hypertension: Secondary | ICD-10-CM | POA: Insufficient documentation

## 2021-04-01 DIAGNOSIS — Z823 Family history of stroke: Secondary | ICD-10-CM | POA: Diagnosis not present

## 2021-04-01 HISTORY — PX: COLONOSCOPY WITH PROPOFOL: SHX5780

## 2021-04-01 SURGERY — COLONOSCOPY WITH PROPOFOL
Anesthesia: General

## 2021-04-01 MED ORDER — PROPOFOL 500 MG/50ML IV EMUL
INTRAVENOUS | Status: DC | PRN
  Administered 2021-04-01: 100 ug/kg/min via INTRAVENOUS

## 2021-04-01 MED ORDER — STERILE WATER FOR IRRIGATION IR SOLN
Status: DC | PRN
  Administered 2021-04-01: 200 mL

## 2021-04-01 MED ORDER — LACTATED RINGERS IV SOLN: INTRAVENOUS | Status: DC | PRN

## 2021-04-01 NOTE — Anesthesia Postprocedure Evaluation (Signed)
Anesthesia Post Note  Patient: Duane Adams  Procedure(s) Performed: COLONOSCOPY WITH PROPOFOL  Patient location during evaluation: Phase II Anesthesia Type: General Level of consciousness: awake Pain management: pain level controlled Vital Signs Assessment: post-procedure vital signs reviewed and stable Respiratory status: spontaneous breathing and respiratory function stable Cardiovascular status: blood pressure returned to baseline and stable Postop Assessment: no headache and no apparent nausea or vomiting Anesthetic complications: no Comments: Late entry   No notable events documented.   Last Vitals:  Vitals:   04/01/21 0954 04/01/21 0956  BP: (!) 107/48   Pulse: 68   Resp: 15   Temp:  (!) 36.4 C  SpO2: 97%     Last Pain:  Vitals:   04/01/21 0956  TempSrc: Axillary  PainSc:                  Louann Sjogren

## 2021-04-01 NOTE — Op Note (Signed)
[facility] Patient Name: Duane Adams Procedure Date: 04/01/2021 9:27 AM MRN: [id number] Date of Birth: 03-19-1956 Attending MD: Elon Alas. Abbey Chatters DO CSN: [id number] Age: 65 Admit Type: Outpatient Procedure:                Colonoscopy Indications:              Rectal bleeding Providers:                Elon Alas. Abbey Chatters, DO, Caprice Kluver, Clenton Pare, Technician Referring MD:              Medicines:                See the Anesthesia note for documentation of the                            administered medications Complications:            No immediate complications. Estimated Blood Loss:     Estimated blood loss: none. Procedure:                Pre-Anesthesia Assessment:                           - The anesthesia plan was to use monitored                            anesthesia care (MAC).                           After obtaining informed consent, the colonoscope                            was passed under direct vision. Throughout the                            procedure, the patient's blood pressure, pulse, and                            oxygen saturations were monitored continuously. The                            [id number]-HQ190L ([id number]) scope was introduced through                            the anus and advanced to the the cecum, identified                            by appendiceal orifice and ileocecal valve. The                            colonoscopy was performed without difficulty. The                            patient tolerated the procedure well. The quality  of the bowel preparation was evaluated using the                            BBPS [facility] Bowel Preparation Scale) with scores                            of: Right Colon = 3, Transverse Colon = 3 and Left                            Colon = 3 (entire mucosa seen well with no residual                            staining, small fragments of stool  or opaque                            liquid). The total BBPS score equals 9. Scope In: 9:42:10 AM Scope Out: 9:51:41 AM Scope Withdrawal Time: 0 hours 7 minutes 22 seconds  Total Procedure Duration: 0 hours 9 minutes 31 seconds  Findings:      The perianal and digital rectal examinations were normal.      Non-bleeding internal hemorrhoids were found during endoscopy.      A few small-mouthed diverticula were found in the sigmoid colon.      2 tattoos were seen in the transverse colon. Both sites appeared normal       without residual polyp tissue. Impression:               - Non-bleeding internal hemorrhoids.                           - Diverticulosis in the sigmoid colon.                           - A tattoo was seen in the transverse colon. The                            tattoo site appeared normal.                           - No specimens collected. Moderate Sedation:      Per Anesthesia Care Recommendation:           - Patient has a contact number available for                            emergencies. The signs and symptoms of potential                            delayed complications were discussed with the                            patient. Return to normal activities tomorrow.                            Written discharge instructions were provided to the  patient.                           - Resume previous diet.                           - Continue present medications.                           - Await pathology results.                           - Repeat colonoscopy in 5 years for surveillance.                           - Return to GI clinic in 4 months. If rectal                            bleeding continues, recommend hemorrhoid banding Procedure Code(s):        --- Professional ---                           [phone], Colonoscopy, flexible; diagnostic, including                            collection of specimen(s) by brushing or washing,                             when performed (separate procedure) Diagnosis Code(s):        --- Professional ---                           K64.8, Other hemorrhoids                           K62.5, Hemorrhage of anus and rectum                           K57.30, Diverticulosis of large intestine without                            perforation or abscess without bleeding CPT copyright 2019 American Medical Association. All rights reserved. The codes documented in this report are preliminary and upon coder review may  be revised to meet current compliance requirements. Elon Alas. Abbey Chatters, DO [place] Abbey Chatters, DO 04/01/2021 9:57:30 AM This report has been signed electronically. Number of Addenda: 0

## 2021-04-01 NOTE — H&P (Signed)
Primary Care Physician:  Pcp, No Primary Gastroenterologist:  Dr. Abbey Chatters  Pre-Procedure History & Physical: HPI:  Duane Adams is a 65 y.o. male is here for a colonoscopy for rectal bleeding. He has a history of CAD with history of PCI on Plavix and aspirin, CVA, HTN, GERD, hepatitis C fibrosis score F2/F3 status post Harvoni treatment in 2016 and documented sustained SVR, adenomatous colon polyp, tubular adenoma with focal high-grade dysplasia in 2016, last colonoscopy was normal in 2018, currently overdue for surveillance, history of rectal bleeding in the setting of internal hemorrhoids s/p flex sig with banding in 2019.  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   Concussion several yrs ago   no residual   Coronary artery disease    a. 03/2013 Cath/PCI: LM nl, LAD 10m (2.5x20 Promus Premier DES), LCX min irregs, RCA dom, 90p(3.0x20 Promus Premier DES), EF 55-65%.   CVA (cerebral vascular accident) ([place]) 01/31/2019   slight memory issues   Essential hypertension, benign    GERD (gastroesophageal reflux disease)    Helicobacter pylori gastritis JUN 2016 EGD/Bx   PREVPAK BID FOR 14 DAYS   Hepatitis    Hepatitis C treated  2011   Hiatal hernia    History of kidney stones    Mixed hyperlipidemia    Neuroma    Prediabetes 06/23/2019   Skin cancer    Substance abuse ([place])    alcoholic quit 4562   Tobacco abuse     Past Surgical History:  Procedure Laterality Date   ABDOMINAL AORTOGRAM W/LOWER EXTREMITY N/A 06/15/2017   Procedure: ABDOMINAL AORTOGRAM W/LOWER EXTREMITY;  Surgeon: Angelia Mould, MD;  Location: [facility];  Service: Cardiovascular;  Laterality: N/A;  Bilateral   BIOPSY N/A 12/19/2014   Procedure: BIOPSY;  Surgeon: Danie Binder, MD;  Location: [facility];  Service: Endoscopy;  Laterality: N/A;   COLONOSCOPY  June 2016   [facility]: with endoscopic mucosal resection.  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 Needs colonoscopy in 1 year.    COLONOSCOPY WITH PROPOFOL N/A 12/19/2014   Dr. Oneida Alar: six simple adenomas and 3 hyperplastic polyps. Had flat mid-transverse colon polyp and referred to [facility] for endoscopic mucosal resection, which is scheduled for July 22   CORONARY ANGIOPLASTY WITH STENT PLACEMENT  04/11/2013   LAD &  RCA     DR COOPER   ESOPHAGOGASTRODUODENOSCOPY (EGD) WITH PROPOFOL N/A 12/19/2014   Dr. Oneida Alar:  without varices, small hiatal hernia noted, moderate non-erosive gastritis and mild duodenitis.  POSITIVE H.PYLORI. Prescribed Prevpac.   EXCISION MORTON'S NEUROMA Left 08/15/2020   Procedure: EXCISION MORTON'S NEUROMA THIRD INTERSPACE LEFT FOOT;  Surgeon: [name], DPM;  Location: [facility];  Service: Podiatry;  Laterality: Left;   EXTRACORPOREAL SHOCK WAVE LITHOTRIPSY  2001   FLEXIBLE SIGMOIDOSCOPY N/A 12/23/2017   Procedure: FLEXIBLE SIGMOIDOSCOPY;  Surgeon: Danie Binder, MD;  [facility];  Service: Endoscopy;  Laterality: N/A;  1:45pm   HAND RECONSTRUCTION Left    HEMORRHOID BANDING N/A 12/23/2017   Procedure: HEMORRHOID BANDING;  Surgeon: Danie Binder, MD;  [facility];  Service: Endoscopy;  Laterality: N/A;   LEFT HEART CATH AND CORONARY ANGIOGRAPHY N/A 01/22/2021   Procedure: LEFT HEART CATH AND CORONARY ANGIOGRAPHY;  Surgeon: Martinique, Peter M, MD;  Location: [facility];  Service: Cardiovascular;  Laterality: N/A;   LEFT HEART CATHETERIZATION WITH CORONARY ANGIOGRAM N/A 04/11/2013   Procedure: LEFT HEART CATHETERIZATION WITH CORONARY ANGIOGRAM;  Surgeon: Blane Ohara, MD;  Location: [facility];  Service: Cardiovascular;  Laterality: N/A;   LEG TENDON SURGERY Right    POLYPECTOMY N/A 12/19/2014   Procedure: POLYPECTOMY;  Surgeon: Danie Binder, MD;  Location: [facility];  Service: Endoscopy;  Laterality: N/A;    Prior to Admission medications   Medication Sig Start  Date End Date Taking? Authorizing Provider  amLODipine (NORVASC) 10 MG tablet Take 1 tablet (10 mg total) by mouth daily. 03/29/21  Yes Strader, Fransisco Hertz, PA-C  aspirin EC 81 MG tablet Take 81 mg by mouth at bedtime.    Yes [provider]  atorvastatin (LIPITOR) 80 MG tablet Take 1 tablet (80 mg total) by mouth daily. 01/18/21 04/18/21 Yes Strader, Fransisco Hertz, PA-C  Cholecalciferol (VITAMIN D3) 2000 units TABS Take 4,000 Units by mouth 2 (two) times daily.   Yes [provider]  cloNIDine (CATAPRES) 0.1 MG tablet Take 1 tablet (0.1 mg total) by mouth 2 (two) times daily. 02/20/21  Yes BranchAlphonse Guild, MD  isosorbide mononitrate (IMDUR) 30 MG 24 hr tablet Take 1 tablet (30 mg total) by mouth daily. 03/29/21  Yes Strader, [place] M, PA-C  metoprolol succinate (TOPROL-XL) 100 MG 24 hr tablet Take 1 tablet (100 mg total) by mouth daily. Take in addition to 50mg  daily for a total of 150mg  daily. 03/29/21  Yes Strader, [place] M, PA-C  metoprolol succinate (TOPROL-XL) 50 MG 24 hr tablet Take 1 tablet (50 mg total) by mouth at bedtime. Take with or immediately following a meal. 03/29/21 06/27/21 Yes Strader, Fransisco Hertz, PA-C  Multiple Vitamin (MULTIVITAMIN WITH MINERALS) TABS tablet Take 1 tablet by mouth daily.   Yes [provider]  nitroGLYCERIN (NITROSTAT) 0.4 MG SL tablet Place 1 tablet (0.4 mg total) under the tongue every 5 (five) minutes as needed for chest pain. 01/18/21  Yes Strader, [place] M, PA-C  Omega 3 1200 MG CAPS Take 2,400 mg by mouth daily.   Yes [provider]  pantoprazole (PROTONIX) 40 MG tablet TAKE ONE TABLET BY MOUTH ONCE DAILY. Patient taking differently: Take 40 mg by mouth daily. 03/12/21  Yes Annitta Needs, NP  potassium chloride SA (KLOR-CON) 20 MEQ tablet TAKE ONE TABLET BY MOUTH ONCE DAILY. Patient taking differently: Take 20 mEq by mouth daily. 07/16/20  Yes Branch, Alphonse Guild, MD  chlorthalidone (HYGROTON) 25 MG tablet Take 1 tablet (25 mg  total) by mouth daily. 03/29/21   Strader, Fransisco Hertz, PA-C    Allergies as of 03/20/2021 - Review Complete 03/19/2021  Allergen Reaction Noted   Lisinopril Swelling 08/18/2012   Crestor [rosuvastatin] Other (See Comments) 01/19/2021   Chantix [varenicline] Other (See Comments) 07/20/2017   Neosporin [neomycin-bacitracin zn-polymyx] Rash 10/28/2013    Family History  Problem Relation Age of Onset   Stroke Mother    Aneurysm Mother    Hypertension Mother    Heart disease Father    Hypertension Father    Heart disease Brother    Hypertension Brother    Alcohol abuse Brother    Diabetes Paternal Aunt    Colon cancer Neg Hx     Social History   Socioeconomic History   Marital status: Soil scientist    Spouse name: Judie Petit   Number of children: 2   Years of education: 14   Highest education level: Not on file  Occupational History   Occupation: disabled    Comment: heart  Tobacco Use   Smoking status: Light Smoker    Packs/day: 0.50    Years: 40.00    Pack years: 20.00    Types: Cigarettes    Start date: 07/14/1968   Smokeless tobacco: Never   Tobacco comments:    5-6 Cigarettes per day  Vaping Use   Vaping Use: Never used  Substance and Sexual Activity   Alcohol use: Yes    Comment: 4 beer every 2 weeks.   Drug use: Not Currently    Types: Cocaine, Marijuana    Comment: cocaine marijuana and acid last used 1984   Sexual activity: Not Currently  Other Topics Concern   Not on file  Social History Narrative   Disabled from heart disease   Previously worked in Scientist, research (medical) with Neurosurgeon for 28 years   Leisure: Teaching laboratory technician   Social Determinants of Health   Financial Resource Strain: Not on file  Food Insecurity: Not on file  Transportation Needs: Not on file  Physical Activity: Not on file  Stress: Not on file  Social Connections: Not on file  Intimate Partner Violence: Not on file    Review of Systems: See HPI, otherwise negative ROS  Physical  Exam: Vital signs in last 24 hours: Temp:  [98.5 F (36.9 C)] 98.5 F (36.9 C) (09/19 0909) Pulse Rate:  [75] 75 (09/19 0909) Resp:  [21] 21 (09/19 0909) BP: (162)/(71) 162/71 (09/19 0909) SpO2:  [99 %] 99 % (09/19 0909) Weight:  [83.9 kg] 83.9 kg (09/19 0907)   General:   Alert,  Well-developed, well-nourished, pleasant and cooperative in NAD Head:  Normocephalic and atraumatic. Eyes:  Sclera clear, no icterus.   Conjunctiva pink. Ears:  Normal auditory acuity. Nose:  No deformity, discharge,  or lesions. Mouth:  No deformity or lesions, dentition normal. Neck:  Supple; no masses or thyromegaly. Lungs:  Clear throughout to auscultation.   No wheezes, crackles, or rhonchi. No acute distress. Heart:  Regular rate and rhythm; no murmurs, clicks, rubs,  or gallops. Abdomen:  Soft, nontender and nondistended. No masses, hepatosplenomegaly or hernias noted. Normal bowel sounds, without guarding, and without rebound.   Msk:  Symmetrical without gross deformities. Normal posture. Extremities:  Without clubbing or edema. Neurologic:  Alert and  oriented x4;  grossly normal neurologically. Skin:  Intact without significant lesions or rashes. Cervical Nodes:  No significant cervical adenopathy. Psych:  Alert and cooperative. Normal mood and affect.  Impression/Plan: KREG EARHART is here for a colonoscopy to be performed for rectal bleeding.   The risks of the procedure including infection, bleed, or perforation as well as benefits, limitations, alternatives and imponderables have been reviewed with the patient. Questions have been answered. All parties agreeable.

## 2021-04-01 NOTE — Transfer of Care (Signed)
Immediate Anesthesia Transfer of Care Note  Patient: Duane Adams  Procedure(s) Performed: COLONOSCOPY WITH PROPOFOL  Patient Location: Short Stay  Anesthesia Type:General  Level of Consciousness: awake, alert , oriented and patient cooperative  Airway & Oxygen Therapy: Patient Spontanous Breathing  Post-op Assessment: Report given to RN, Post -op Vital signs reviewed and stable and Patient moving all extremities X 4  Post vital signs: Reviewed and stable  Last Vitals:  Vitals Value Taken Time  BP    Temp    Pulse    Resp    SpO2      Last Pain:  Vitals:   04/01/21 0938  TempSrc:   PainSc: 0-No pain      Patients Stated Pain Goal: 7 ([id number])  Complications: No notable events documented.

## 2021-04-01 NOTE — Discharge Instructions (Addendum)
  Colonoscopy Discharge Instructions  Read the instructions outlined below and refer to this sheet in the next few weeks. These discharge instructions provide you with general information on caring for yourself after you leave the hospital. Your doctor may also give you specific instructions. While your treatment has been planned according to the most current medical practices available, unavoidable complications occasionally occur.   ACTIVITY You may resume your regular activity, but move at a slower pace for the next 24 hours.  Take frequent rest periods for the next 24 hours.  Walking will help get rid of the air and reduce the bloated feeling in your belly (abdomen).  No driving for 24 hours (because of the medicine (anesthesia) used during the test).   Do not sign any important legal documents or operate any machinery for 24 hours (because of the anesthesia used during the test).  NUTRITION Drink plenty of fluids.  You may resume your normal diet as instructed by your doctor.  Begin with a light meal and progress to your normal diet. Heavy or fried foods are harder to digest and may make you feel sick to your stomach (nauseated).  Avoid alcoholic beverages for 24 hours or as instructed.  MEDICATIONS You may resume your normal medications unless your doctor tells you otherwise.  WHAT YOU CAN EXPECT TODAY Some feelings of bloating in the abdomen.  Passage of more gas than usual.  Spotting of blood in your stool or on the toilet paper.  IF YOU HAD POLYPS REMOVED DURING THE COLONOSCOPY: No aspirin products for 7 days or as instructed.  No alcohol for 7 days or as instructed.  Eat a soft diet for the next 24 hours.  FINDING OUT THE RESULTS OF YOUR TEST Not all test results are available during your visit. If your test results are not back during the visit, make an appointment with your caregiver to find out the results. Do not assume everything is normal if you have not heard from your  caregiver or the medical facility. It is important for you to follow up on all of your test results.  SEEK IMMEDIATE MEDICAL ATTENTION IF: You have more than a spotting of blood in your stool.  Your belly is swollen (abdominal distention).  You are nauseated or vomiting.  You have a temperature over 101.  You have abdominal pain or discomfort that is severe or gets worse throughout the day.   Your colonoscopy was relatively unremarkable.  Both the previously tattooed sites in your colon appeared healthy without residual polyp tissue. You do have diverticulosis and internal hemorrhoids. I would recommend increasing fiber in your diet or adding OTC Benefiber/Metamucil. Be sure to drink at least 4 to 6 glasses of water daily.  Bleeding likely from internal hemorrhoids.  Follow-up with Roseanne Kaufman to discuss possible banding.  Recommend repeat colonoscopy in 5 years.   I hope you have a great rest of your week!  Elon Alas. Abbey Chatters, D.O. Gastroenterology and Hepatology [facility]

## 2021-04-01 NOTE — Anesthesia Preprocedure Evaluation (Signed)
Anesthesia Evaluation  Patient identified by MRN, date of birth, ID band Patient awake    Reviewed: Allergy & Precautions, H&P , NPO status , Patient's Chart, lab work & pertinent test results, reviewed documented beta blocker date and time   Airway Mallampati: II  TM Distance: >3 FB Neck ROM: full    Dental no notable dental hx.    Pulmonary neg pulmonary ROS, Current Smoker,    Pulmonary exam normal breath sounds clear to auscultation       Cardiovascular Exercise Tolerance: Good hypertension, + CAD   Rhythm:regular Rate:Normal     Neuro/Psych  Neuromuscular disease CVA negative psych ROS   GI/Hepatic Neg liver ROS, hiatal hernia, GERD  Medicated,  Endo/Other  negative endocrine ROS  Renal/GU negative Renal ROS  negative genitourinary   Musculoskeletal   Abdominal   Peds  Hematology negative hematology ROS (+)   Anesthesia Other Findings ? Previously placed Prox LAD to Mid LAD stent (unknown type) is widely patent. ? 1st Diag lesion is 35% stenosed. ? Mid LAD lesion is 25% stenosed. ? Ost Cx to Prox Cx lesion is 40% stenosed. ? Ost RCA to Prox RCA lesion is 10% stenosed. ? Prox RCA lesion is 30% stenosed. ? The left ventricular systolic function is normal. ? LV end diastolic pressure is normal. ? The left ventricular ejection fraction is 55-65% by visual estimate.   1. Nonobstructive CAD. Excellent patency of stents in the LAD and RCA 2. Normal LV function 3. Normal LV EDP   Reproductive/Obstetrics negative OB ROS                             Anesthesia Physical Anesthesia Plan  ASA: 3  Anesthesia Plan: General   Post-op Pain Management:    Induction:   PONV Risk Score and Plan: Propofol infusion  Airway Management Planned:   Additional Equipment:   Intra-op Plan:   Post-operative Plan:   Informed Consent: I have reviewed the patients History and Physical, chart,  labs and discussed the procedure including the risks, benefits and alternatives for the proposed anesthesia with the patient or authorized representative who has indicated his/her understanding and acceptance.     Dental Advisory Given  Plan Discussed with: CRNA  Anesthesia Plan Comments:         Anesthesia Quick Evaluation

## 2021-04-05 ENCOUNTER — Encounter (HOSPITAL_COMMUNITY): Payer: Self-pay | Admitting: Internal Medicine

## 2021-04-16 ENCOUNTER — Other Ambulatory Visit: Payer: Self-pay

## 2021-04-16 ENCOUNTER — Ambulatory Visit: Payer: Medicaid Other | Admitting: Pulmonary Disease

## 2021-04-16 ENCOUNTER — Encounter: Payer: Self-pay | Admitting: Pulmonary Disease

## 2021-04-16 ENCOUNTER — Ambulatory Visit (HOSPITAL_COMMUNITY)
Admission: RE | Admit: 2021-04-16 | Discharge: 2021-04-16 | Disposition: A | Discharge status: Home / Self Care | Payer: Medicaid Other | Source: Ambulatory Visit | Attending: Pulmonary Disease | Admitting: Pulmonary Disease

## 2021-04-16 VITALS — BP 142/78 | HR 84 | Temp 98.7°F | Ht 69.0 in | Wt 190.1 lb

## 2021-04-16 DIAGNOSIS — R0609 Other forms of dyspnea: Secondary | ICD-10-CM | POA: Diagnosis not present

## 2021-04-16 DIAGNOSIS — Z720 Tobacco use: Secondary | ICD-10-CM | POA: Diagnosis not present

## 2021-04-16 DIAGNOSIS — R059 Cough, unspecified: Secondary | ICD-10-CM

## 2021-04-16 DIAGNOSIS — R053 Chronic cough: Secondary | ICD-10-CM | POA: Diagnosis not present

## 2021-04-16 MED ORDER — FLUTICASONE FUROATE-VILANTEROL 100-25 MCG/INH IN AEPB: 1.0000 | INHALATION_SPRAY | Freq: Every day | RESPIRATORY_TRACT | 5 refills | Status: DC

## 2021-04-16 NOTE — Patient Instructions (Signed)
Breo one puff daily, and rinse your mouth after each use  Chest xray at [facility]  Will arrange for pulmonary function test at [facility] office and follow up after this with Dr. Halford Chessman or Nurse Practitioner

## 2021-04-16 NOTE — Progress Notes (Signed)
Pulmonary, Critical Care, and Sleep Medicine  Chief Complaint  Patient presents with   Consult    SOB frequently. SOB mainly with exertion but also when sitting. Coughing up clear mucus frequently.     Past Surgical History:  He  has a past surgical history that includes Hand reconstruction (Left); Leg Tendon Surgery (Right); left heart catheterization with coronary angiogram (N/A, 04/11/2013); Colonoscopy with propofol (N/A, 12/19/2014); Esophagogastroduodenoscopy (egd) with propofol (N/A, 12/19/2014); polypectomy (N/A, 12/19/2014); biopsy (N/A, 12/19/2014); Colonoscopy (June 2016); ABDOMINAL AORTOGRAM W/LOWER EXTREMITY (N/A, 06/15/2017); Flexible sigmoidoscopy (N/A, 12/23/2017); Hemorrhoid banding (N/A, 12/23/2017); Extracorporeal shock wave lithotripsy (2001); Coronary angioplasty with stent (04/11/2013); Excision Morton's neuroma (Left, 08/15/2020); LEFT HEART CATH AND CORONARY ANGIOGRAPHY (N/A, 01/22/2021); and Colonoscopy with propofol (N/A, 04/01/2021).  Past Medical History:  Allergies, Anxiety, ETOH, Fatty liver, Hep C, CAD s/p PCI, CVA, HTN, GERD, HH, HLD, Pre-DM, CVA, Lt subclavian stenosis  Constitutional:  BP (!) 142/78 (BP Location: Left Arm, Patient Position: Sitting)   Pulse 84   Temp 98.7 F (37.1 C) (Temporal)   Ht 5\' 9"  (1.753 m)   Wt 190 lb 1.3 oz (86.2 kg)   SpO2 96%   BMI 28.07 kg/m   Brief Summary:  Duane Adams is a 65 y.o. male smoker with shortness of breath.      Subjective:   He is from [place].  He lived in [place] and [place] before moving to [place] about 11 years ago.  He started smoking at age 35.  He smoked up to 1 pack per day.  He is down to 1/2 ppd, and plans to be done with cigarettes in a month.  He has been using nicotine replacement.  He used advair years ago.  He doesn't remember if this helped then, and got tired of needing medicine so he stopped using it.  He was never told he had asthma or COPD before. No problem with  allergies.  No family history of lung disease.  He never had pneumonia.  His aunt had tuberculosis.  He reports being PPD tested several times before and these were always negative.  He gets winded after walking about 50 feet.  He has back and circulation problems, and these limit his exercise capacity also.  He gets a cough with clear sputum on daily basis.  He gets intermittent wheezing.  He feels sore and tight in his chest when he can't breath.  Denies hemoptysis, or leg swelling.  No history of thromboembolic disease.  Physical Exam:   Appearance - well kempt   ENMT - no sinus tenderness, no oral exudate, no LAN, Mallampati 3 airway, no stridor, poor dentition  Respiratory - equal breath sounds bilaterally, no wheezing or rales  CV - s1s2 regular rate and rhythm, no murmurs  Ext - no clubbing, no edema  Skin - no rashes  Psych - normal mood and affect   Pulmonary testing:  PFT 10/18/14 >> FEV1 2.90 (81%), FEV1% 84, TLC 5.25 (77%), DLCO 54%, no BD  Chest Imaging:  CT chest 03/02/13 >> 5 mm RUL nodule, lingular scarring, calcified subcarinal LN, calcified granuloma RLL  Sleep Tests:    Cardiac Tests:  Echo 04/04/13 >> EF 60 to 65%, mild LVH, grade 1 DD LHC 01/22/21 >> nonobstructive CAD, patent stents to LAD and RCA, normal LV fx and LVEDP  Social History:  He  reports that he has been smoking cigarettes. He started smoking about 52 years ago. He has a 37.50 pack-year smoking  history. He has never used smokeless tobacco. He reports current alcohol use. He reports that he does not currently use drugs after having used the following drugs: Cocaine and Marijuana.  Family History:  His family history includes Alcohol abuse in his brother; Aneurysm in his mother; Diabetes in his paternal aunt; Heart disease in his brother and father; Hypertension in his brother, father, and mother; Stroke in his mother.    Labs:   CMP Latest Ref Rng & Units 03/19/2021 03/18/2021 01/22/2021  Glucose 70  - 99 mg/dL 111(H) 125(H) 129(H)  BUN 8 - 23 mg/dL 12 13 12   Creatinine 0.61 - 1.24 mg/dL 0.71 0.74 0.70  Sodium 135 - 145 mmol/L 139 137 140  Potassium 3.5 - 5.1 mmol/L 3.7 3.3(L) 3.3(L)  Chloride 98 - 111 mmol/L 104 103 103  CO2 22 - 32 mmol/L 28 27 -  Calcium 8.9 - 10.3 mg/dL 8.6(L) 9.2 -  Total Protein 6.5 - 8.1 g/dL - 7.4 -  Total Bilirubin 0.3 - 1.2 mg/dL - 0.5 -  Alkaline Phos 38 - 126 U/L - 48 -  AST 15 - 41 U/L - 28 -  ALT 0 - 44 U/L - 39 -    CBC Latest Ref Rng & Units 03/21/2021 03/19/2021 03/19/2021  WBC 3.8 - 10.8 Thousand/uL 8.1 - -  Hemoglobin 13.2 - 17.1 g/dL 12.9(L) 13.2 13.3  Hematocrit 38.5 - 50.0 % 38.1(L) 40.2 39.0  Platelets 140 - 400 Thousand/uL 222 - -   Discussion:  He has extensive history of tobacco abuse.  He has progressive symptoms of dyspnea on exertion.  This is associated with daily productive cough, and intermittent wheezing.  He could have obstructive lung disease.  His previous PFT showed restrictive and diffusion defect.  His exercise capacity also seems limited by back pain and peripheral artery disease, both of which are contributing to deconditioning.  Assessment/Plan:   Possible obstructive lung disease. - chest xray today - will arrange for pulmonary function test in [place] office - trial of breo 100 one puff daily  Deconditioning. - he would benefit from gradual exercise regimen, but might be limited because of orthopedic and vascular isssues  Tobacco abuse. - encouraged him to keep up with smoking cessation efforts - if his chest xray is unremarkable, then will need to discuss option of enrolling in lung cancer screening program  Immunizations. - will need to discuss status of his flu, pneumococcal, and COVID vaccinations at next visit  Coronary artery disease. - followed by Dr. Carlyle Dolly with [facility]  Peripheral artery disease. - followed by Dr. Gae Gallop with Vascular surgery  Time Spent Involved in Patient  Care on Day of Examination:  47 minutes  Follow up:   Patient Instructions  Breo one puff daily, and rinse your mouth after each use  Chest xray at [facility]  Will arrange for pulmonary function test at [facility] office and follow up after this with Dr. Halford Chessman or Nurse Practitioner  Medication List:   Allergies as of 04/16/2021       Reactions   Lisinopril Swelling   Swelling neck   Crestor [rosuvastatin] Other (See Comments)   Severe muscle aches   Chantix [varenicline] Other (See Comments)   Abdominal pain   Neosporin [neomycin-bacitracin Zn-polymyx] Rash   blistered        Medication List        Accurate as of April 16, 2021  9:41 AM. If you have any questions, ask your  nurse or doctor.          amLODipine 10 MG tablet Commonly known as: NORVASC Take 1 tablet (10 mg total) by mouth daily.   aspirin EC 81 MG tablet Take 81 mg by mouth at bedtime.   atorvastatin 80 MG tablet Commonly known as: LIPITOR Take 1 tablet (80 mg total) by mouth daily.   chlorthalidone 25 MG tablet Commonly known as: HYGROTON Take 1 tablet (25 mg total) by mouth daily.   cloNIDine 0.1 MG tablet Commonly known as: CATAPRES Take 1 tablet (0.1 mg total) by mouth 2 (two) times daily.   fluticasone furoate-vilanterol 100-25 MCG/INH Aepb Commonly known as: Breo Ellipta Inhale 1 puff into the lungs daily. Started by: Chesley Mires, MD   isosorbide mononitrate 30 MG 24 hr tablet Commonly known as: Imdur Take 1 tablet (30 mg total) by mouth daily.   metoprolol succinate 50 MG 24 hr tablet Commonly known as: TOPROL-XL Take 1 tablet (50 mg total) by mouth at bedtime. Take with or immediately following a meal.   metoprolol succinate 100 MG 24 hr tablet Commonly known as: TOPROL-XL Take 1 tablet (100 mg total) by mouth daily. Take in addition to 50mg  daily for a total of 150mg  daily.   multivitamin with minerals Tabs tablet Take 1 tablet by mouth daily.   nitroGLYCERIN  0.4 MG SL tablet Commonly known as: Nitrostat Place 1 tablet (0.4 mg total) under the tongue every 5 (five) minutes as needed for chest pain.   Omega 3 1200 MG Caps Take 2,400 mg by mouth daily.   pantoprazole 40 MG tablet Commonly known as: PROTONIX TAKE ONE TABLET BY MOUTH ONCE DAILY.   potassium chloride SA 20 MEQ tablet Commonly known as: KLOR-CON TAKE ONE TABLET BY MOUTH ONCE DAILY.   Vitamin D3 50 MCG (2000 UT) Tabs Take 4,000 Units by mouth 2 (two) times daily.        Signature:  Chesley Mires, MD [place] Pager - [phone] 04/16/2021, 9:41 AM

## 2021-04-17 NOTE — Progress Notes (Signed)
Referring Provider: Doree Albee, MD Primary Care Physician:  Pcp, No Primary GI Physician: Dr. Abbey Chatters  Chief Complaint  Patient presents with   Rectal Bleeding    ok    HPI:   Duane Adams is a 65 y.o. 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  Patient was briefly admitted to the hospital 9/5-9/6 with painless rectal bleeding on Plavix and aspirin.  Suspected symptoms likely secondary to hemorrhoids as he had no significant decline in hemoglobin.  Case was discussed with Dr. Harl Bowie who agreed Plavix could be discontinued as PCI was greater than 1 year ago.  He was continued on aspirin and discharged with plans for outpatient colonoscopy.  Colonoscopy 04/01/2021: Nonbleeding internal hemorrhoids, diverticulosis in the sigmoid colon, tattoo in transverse colon with tattoo site appearing normal.  Recommended repeat colonoscopy in 5 years.  Recommended hemorrhoid banding if rectal bleeding continued.  Today:  Rectal bleeding: Minimal toilet tissue hematochezia. Maybe once a week. Very little amount, not bothered by this. Bleeding essentially stopped after he stopped the Plavix. No constipation or straining.  History of liver fibrosis: Needs RUQ ultrasound. Drinks 5-6 beer every couple of weeks when mowing. States it is for pain relief. No IV or intranasal drug use. No tylenol.   GERD: No trouble with reflux. Has been taking Protonix for years. Used to have regurgitation. No dysphagia. No nausea or vomiting. No abdominal pain. Would like to stop Protonix.   Started seeing a pulmonologist due to frequent cough productive of clear sputum and  DOE. Plans to do PFTs soon.   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   Concussion several yrs ago   no residual   Coronary artery disease    a. 03/2013 Cath/PCI: LM nl, LAD 72m (2.5x20 Promus Premier DES), LCX min irregs, RCA dom, 90p(3.0x20 Promus Premier DES), EF 55-65%.   CVA (cerebral vascular accident) ([place]) 01/31/2019   slight memory issues   Essential hypertension, benign    GERD (gastroesophageal reflux disease)    Helicobacter pylori gastritis JUN 2016 EGD/Bx   PREVPAK BID FOR 14 DAYS   Hepatitis    Hepatitis C treated  2011   Hiatal hernia    History of kidney stones    Mixed hyperlipidemia    Neuroma    Prediabetes 06/23/2019   Skin cancer    Substance abuse ([place])    alcoholic quit 6812   Tobacco abuse     Past Surgical History:  Procedure Laterality Date   ABDOMINAL AORTOGRAM W/LOWER EXTREMITY N/A 06/15/2017   Procedure: ABDOMINAL AORTOGRAM W/LOWER EXTREMITY;  Surgeon: Angelia Mould, MD;  Location: [place] [facility];  Service: Cardiovascular;  Laterality: N/A;  Bilateral   BIOPSY N/A 12/19/2014   Procedure: BIOPSY;  Surgeon: Danie Binder, MD;  Location: [facility];  Service: Endoscopy;  Laterality: N/A;   COLONOSCOPY  12/2014   [facility]: with endoscopic mucosal resection. 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 Needs colonoscopy in 1 year.    COLONOSCOPY WITH PROPOFOL N/A 12/19/2014   Dr. Oneida Alar: six  simple adenomas and 3 hyperplastic polyps. Had flat mid-transverse colon polyp and referred to [facility] for endoscopic mucosal resection, which is scheduled for July 22   COLONOSCOPY WITH PROPOFOL N/A 04/01/2021   Surgeon: Eloise Harman, DO;Nonbleeding internal hemorrhoids, diverticulosis in the sigmoid colon, tattoo in transverse colon with tattoo site appearing normal.  Recommended repeat colonoscopy in 5 years.   CORONARY ANGIOPLASTY WITH STENT  PLACEMENT  04/11/2013   LAD &  RCA     DR COOPER   ESOPHAGOGASTRODUODENOSCOPY (EGD) WITH PROPOFOL N/A 12/19/2014   Dr. Oneida Alar:  without varices, small hiatal hernia noted, moderate non-erosive gastritis and mild duodenitis.  POSITIVE H.PYLORI. Prescribed Prevpac.   EXCISION MORTON'S NEUROMA Left 08/15/2020   Procedure: EXCISION MORTON'S NEUROMA THIRD INTERSPACE LEFT FOOT;  Surgeon: [name], DPM;  Location: [facility];  Service: Podiatry;  Laterality: Left;   EXTRACORPOREAL SHOCK WAVE LITHOTRIPSY  2001   FLEXIBLE SIGMOIDOSCOPY N/A 12/23/2017   Procedure: FLEXIBLE SIGMOIDOSCOPY;  Surgeon: Danie Binder, MD;  [facility]: [facility];  Service: Endoscopy;  Laterality: N/A;  1:45pm   HAND RECONSTRUCTION Left    HEMORRHOID BANDING N/A 12/23/2017   Procedure: HEMORRHOID BANDING;  Surgeon: Danie Binder, MD;  [facility];  Service: Endoscopy;  Laterality: N/A;   LEFT HEART CATH AND CORONARY ANGIOGRAPHY N/A 01/22/2021   Procedure: LEFT HEART CATH AND CORONARY ANGIOGRAPHY;  Surgeon: Martinique, Peter M, MD;  Location: [place] [facility];  Service: Cardiovascular;  Laterality: N/A;   LEFT HEART CATHETERIZATION WITH CORONARY ANGIOGRAM N/A 04/11/2013   Procedure: LEFT HEART CATHETERIZATION WITH CORONARY ANGIOGRAM;  Surgeon: Blane Ohara, MD;  Location: [facility] C[facility];  Service: Cardiovascular;  Laterality: N/A;   LEG TENDON SURGERY Right    POLYPECTOMY N/A 12/19/2014   Procedure: POLYPECTOMY;  Surgeon: Danie Binder, MD;  Location: [facility];  Service: Endoscopy;  Laterality: N/A;    Current Outpatient Medications  Medication Sig Dispense Refill   amLODipine (NORVASC) 10 MG tablet Take 1 tablet (10 mg total) by mouth daily. 90 tablet 3   aspirin EC 81 MG tablet Take 81 mg by mouth at bedtime.      atorvastatin (LIPITOR) 80 MG tablet Take 1 tablet (80 mg total) by mouth daily. 90 tablet 3   chlorthalidone (HYGROTON) 25 MG tablet Take 1 tablet (25 mg total) by mouth daily.  90 tablet 3   Cholecalciferol (VITAMIN D3) 2000 units TABS Take 4,000 Units by mouth 2 (two) times daily.     cloNIDine (CATAPRES) 0.1 MG tablet Take 1 tablet (0.1 mg total) by mouth 2 (two) times daily. 180 tablet 1   fluticasone furoate-vilanterol (BREO ELLIPTA) 100-25 MCG/INH AEPB Inhale 1 puff into the lungs daily. 30 each 5   isosorbide mononitrate (IMDUR) 30 MG 24 hr tablet Take 1 tablet (30 mg total) by mouth daily. 90 tablet 3   metoprolol succinate (TOPROL-XL) 100 MG 24 hr tablet Take 1 tablet (100 mg total) by mouth daily. Take in addition to 50mg  daily for a total of 150mg  daily. 90 tablet 3   metoprolol succinate (TOPROL-XL) 50 MG 24 hr tablet Take 1 tablet (50 mg total) by mouth at bedtime. Take with or immediately following a meal. 90 tablet 3   Multiple Vitamin (MULTIVITAMIN WITH MINERALS) TABS tablet Take 1 tablet by mouth daily.     nitroGLYCERIN (NITROSTAT) 0.4 MG SL tablet Place 1 tablet (0.4 mg total) under the tongue every 5 (five) minutes as needed for chest  pain. 25 tablet 3   Omega 3 1200 MG CAPS Take 2,400 mg by mouth daily.     pantoprazole (PROTONIX) 20 MG tablet Take 1 tablet (20 mg total) by mouth daily before breakfast. 90 tablet 1   potassium chloride SA (KLOR-CON) 20 MEQ tablet TAKE ONE TABLET BY MOUTH ONCE DAILY. (Patient taking differently: Take 20 mEq by mouth daily.) 90 tablet 3   No current facility-administered medications for this visit.    Allergies as of 04/18/2021 - Review Complete 04/18/2021  Allergen Reaction Noted   Lisinopril Swelling 08/18/2012   Crestor [rosuvastatin] Other (See Comments) 01/19/2021   Chantix [varenicline] Other (See Comments) 07/20/2017   Neosporin [neomycin-bacitracin zn-polymyx] Rash 10/28/2013    Family History  Problem Relation Age of Onset   Stroke Mother    Aneurysm Mother    Hypertension Mother    Heart disease Father    Hypertension Father    Heart disease Brother    Hypertension Brother    Alcohol abuse  Brother    Diabetes Paternal Aunt    Colon cancer Neg Hx     Social History   Socioeconomic History   Marital status: Soil scientist    Spouse name: Tisha   Number of children: 2   Years of education: 14   Highest education level: Not on file  Occupational History   Occupation: disabled    Comment: heart  Tobacco Use   Smoking status: Light Smoker    Packs/day: 0.75    Years: 50.00    Pack years: 37.50    Types: Cigarettes    Start date: 07/14/1968   Smokeless tobacco: Never   Tobacco comments:    5-6 Cigarettes per day  Vaping Use   Vaping Use: Never used  Substance and Sexual Activity   Alcohol use: Yes    Comment: 5-6 beer every 2 weeks.   Drug use: Not Currently    Types: Cocaine, Marijuana    Comment: cocaine marijuana and acid last used 1984   Sexual activity: Not Currently  Other Topics Concern   Not on file  Social History Narrative   Disabled from heart disease   Previously worked in Scientist, research (medical) with Neurosurgeon for 28 years   Leisure: Teaching laboratory technician   Social Determinants of Radio broadcast assistant Strain: Not on file  Food Insecurity: Not on file  Transportation Needs: Not on file  Physical Activity: Not on file  Stress: Not on file  Social Connections: Not on file    Review of Systems: Gen: Denies fever, chills, cold or flulike symptoms, presyncope, syncope. CV: Denies chest pain, palpitations. Resp: Admits to shortness of breath with exertion and cough. GI: See HPI Heme: See HPI  Physical Exam: BP (!) 150/85   Pulse 83   Temp (!) 97.1 F (36.2 C) (Temporal)   Ht 5\' 9"  (1.753 m)   Wt 191 lb (86.6 kg)   BMI 28.21 kg/m  General:   Alert and oriented. No distress noted. Pleasant and cooperative.  Head:  Normocephalic and atraumatic. Eyes:  Conjuctiva clear without scleral icterus. Heart:  S1, S2 present without murmurs appreciated. Lungs:  Clear to auscultation bilaterally. No wheezes, rales, or rhonchi. No distress.  Abdomen:  +BS, soft,  and non-distended.  Mild RUQ TTP no rebound or guarding. No HSM or masses noted. Msk:  Symmetrical without gross deformities. Normal posture. Extremities:  Without edema. Neurologic:  Alert and  oriented x4 Psych:  Normal mood and affect.  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 Due for repeat colonoscopy in 5 years.   Rectal Bleeding: Secondary to internal hemorrhoids.  Significantly improved/essentially resolved with discontinuing Plavix which was approved by Dr. Harl Bowie during prior hospitalization as PCI was greater than 1 year ago.  Currently with intermittent scant toilet tissue hematochezia once a week or less.  He is not interested in banding at this time, but can consider if bleeding becomes more significant and is not controlled by rectal creams.  GERD: History of GERD with regurgitation, well controlled on Protonix 40 mg daily for several years.  Patient is interested in trying to discontinue Protonix.  We will try decreasing Protonix to 20 mg daily.  Advised if he is doing well after 8 weeks, he may try decreasing to every other day.  If still doing well after 2 weeks, may try reducing to every 3 days.  If still doing well after 2 weeks, may try discontinuing completely.  Advise if symptoms return 2 or 3 times weekly, would recommend daily dosing.  History of liver fibrosis: Secondary to hepatitis C s/p Harvoni treatment in 2016 with documented sustained SVR.  Due to  fibrosis score of F2/F3, recommended routine ultrasound for surveillance.  Currently overdue.  Encouragingly, he has no signs or symptoms of decompensated liver disease and LFTs are within normal limits.    Plan: Ultrasound. Minimize alcohol use. May use Tylenol as needed.  No more than 2000 mg/day. Encouraged low-fat/low-cholesterol diet, avoiding sweets, sodas, fruit juice, sweetened beverages, and daily exercise. Reduce Protonix to 20 mg daily.  After 8 weeks, if doing well, reduce to every other day.  If still doing well for 2 weeks, reduce to every 3 days.  If still doing well after 2 weeks, try discontinuing completely.  Advised if reflux/regurgitation symptoms returned 2-3 times per week, he should take Protonix daily. Reinforced GERD diet/lifestyle with written instructions provided. Use Preparation H twice daily x7 days as needed for rectal bleeding. If rectal bleeding becomes more significant and is not well controlled by rectal creams, we can move forward with hemorrhoid banding. Follow-up in 6 months.    Aliene Altes, PA-C [facility] 04/18/2021

## 2021-04-18 ENCOUNTER — Encounter: Payer: Self-pay | Admitting: Gastroenterology

## 2021-04-18 ENCOUNTER — Other Ambulatory Visit: Payer: Self-pay

## 2021-04-18 ENCOUNTER — Ambulatory Visit: Payer: Medicaid Other | Admitting: Gastroenterology

## 2021-04-18 VITALS — BP 150/85 | HR 83 | Temp 97.1°F | Ht 69.0 in | Wt 191.0 lb

## 2021-04-18 DIAGNOSIS — K7400 Hepatic fibrosis, unspecified: Secondary | ICD-10-CM

## 2021-04-18 DIAGNOSIS — K625 Hemorrhage of anus and rectum: Secondary | ICD-10-CM

## 2021-04-18 DIAGNOSIS — K219 Gastro-esophageal reflux disease without esophagitis: Secondary | ICD-10-CM

## 2021-04-18 MED ORDER — PANTOPRAZOLE SODIUM 20 MG PO TBEC
20.0000 mg | DELAYED_RELEASE_TABLET | Freq: Every day | ORAL | 1 refills | Status: DC
Start: 2021-04-18 — End: 2021-10-15

## 2021-04-18 NOTE — Patient Instructions (Signed)
We will arrange for you to have an ultrasound of your liver in the next few weeks.  For history of liver fibrosis:  As we discussed, it is important that you minimize use of alcohol. You may use Tylenol as needed for pain.  No more than 2000 mg/day. Is also important that she follow a healthy diet:  Low fat/cholesterol diet.   Avoid sweets, sodas, fruit juices, sweetened beverages like tea, etc. Gradually increase exercise from 15 min daily up to 1 hr per day 5 days/week.  For history of reflux/regurgitation: - As requested, we will try reducing pantoprazole to 20 mg daily.  Take this 30 minutes before breakfast. After 8 weeks, if you are doing well with this, you can try reducing to every other day.  After 2 weeks, if you continue to do well, you can reduce to every 3 days.  After 2 more weeks, if you continue to do well, you can try discontinuing completely. - If you are experiencing symptoms 2-3 times per week, I do recommend you take the medication daily. - Follow a GERD diet:  Avoid fried, fatty, greasy, spicy, citrus foods. Avoid caffeine and carbonated beverages. Avoid chocolate. Try eating 4-6 small meals a day rather than 3 large meals. Do not eat within 3 hours of laying down. Prop head of bed up on wood or bricks to create a 6 inch incline.  For rectal bleeding: Rectal bleeding a secondary to hemorrhoids.  I am glad this has essentially stopped. You may use Preparation H twice daily x7 days then as needed if rectal bleeding returns. If your rectal bleeding becomes more significant and is not controlled by rectal creams, we can move forward with hemorrhoid banding.   It was great to see you today! Hope you enjoy this cooler weather!   Aliene Altes, PA-C [facility]

## 2021-04-24 ENCOUNTER — Ambulatory Visit (HOSPITAL_COMMUNITY)
Admission: RE | Admit: 2021-04-24 | Discharge: 2021-04-24 | Disposition: A | Discharge status: Home / Self Care | Payer: Medicaid Other | Source: Ambulatory Visit | Attending: Gastroenterology | Admitting: Gastroenterology

## 2021-04-24 ENCOUNTER — Other Ambulatory Visit: Payer: Self-pay

## 2021-04-24 DIAGNOSIS — K7400 Hepatic fibrosis, unspecified: Secondary | ICD-10-CM | POA: Insufficient documentation

## 2021-04-24 DIAGNOSIS — K7689 Other specified diseases of liver: Secondary | ICD-10-CM | POA: Diagnosis not present

## 2021-04-30 ENCOUNTER — Other Ambulatory Visit (HOSPITAL_COMMUNITY): Payer: Self-pay

## 2021-04-30 ENCOUNTER — Telehealth: Payer: Self-pay | Admitting: Pharmacy Technician

## 2021-04-30 NOTE — Telephone Encounter (Signed)
Patient Advocate Encounter  Received notification from [place] that prior authorization for BREO is required.   PA submitted on 10.18.22 Key [id number] Status is pending   [facility] will continue to follow  Luciano Cutter, CPhT Patient Advocate Phone: [phone] Fax:  [phone]

## 2021-04-30 NOTE — Telephone Encounter (Signed)
Please send scrip for symbicort 160 two puffs bid.

## 2021-05-08 MED ORDER — BUDESONIDE-FORMOTEROL FUMARATE 160-4.5 MCG/ACT IN AERO: 2.0000 | INHALATION_SPRAY | Freq: Two times a day (BID) | RESPIRATORY_TRACT | 6 refills | Status: DC

## 2021-05-08 NOTE — Telephone Encounter (Signed)
Spoke to patients wife (per DPR). She said that the patient would be okay with trying symbicort and requested for it to be sent to [employer] [facility]. Will call if this inhaler works well for him. Nothing further needed at this time.   Order for symbicort placed.

## 2021-05-13 ENCOUNTER — Ambulatory Visit: Payer: Medicaid Other | Admitting: Adult Health

## 2021-05-20 ENCOUNTER — Other Ambulatory Visit: Payer: Self-pay | Admitting: Cardiology

## 2021-06-17 ENCOUNTER — Ambulatory Visit: Payer: Medicaid Other | Admitting: Cardiology

## 2021-07-16 ENCOUNTER — Other Ambulatory Visit: Payer: Self-pay | Admitting: Gastroenterology

## 2021-07-16 DIAGNOSIS — K219 Gastro-esophageal reflux disease without esophagitis: Secondary | ICD-10-CM

## 2021-07-18 NOTE — Telephone Encounter (Signed)
If patient still taking pantoprazole or was he able to taper off?

## 2021-07-18 NOTE — Telephone Encounter (Signed)
Noted  

## 2021-07-18 NOTE — Telephone Encounter (Signed)
Spoke to pt. He informed me that he is no longer taking Pantoprazole

## 2021-08-14 ENCOUNTER — Encounter: Payer: Self-pay | Admitting: Internal Medicine

## 2021-08-14 ENCOUNTER — Ambulatory Visit: Payer: Medicaid Other | Admitting: Internal Medicine

## 2021-08-14 ENCOUNTER — Other Ambulatory Visit: Payer: Self-pay

## 2021-08-14 VITALS — BP 138/82 | HR 82 | Resp 18 | Ht 69.0 in | Wt 196.0 lb

## 2021-08-14 DIAGNOSIS — K219 Gastro-esophageal reflux disease without esophagitis: Secondary | ICD-10-CM

## 2021-08-14 DIAGNOSIS — I10 Essential (primary) hypertension: Secondary | ICD-10-CM

## 2021-08-14 DIAGNOSIS — R222 Localized swelling, mass and lump, trunk: Secondary | ICD-10-CM

## 2021-08-14 DIAGNOSIS — I2510 Atherosclerotic heart disease of native coronary artery without angina pectoris: Secondary | ICD-10-CM | POA: Diagnosis not present

## 2021-08-14 DIAGNOSIS — R7303 Prediabetes: Secondary | ICD-10-CM | POA: Diagnosis not present

## 2021-08-14 DIAGNOSIS — M51369 Other intervertebral disc degeneration, lumbar region without mention of lumbar back pain or lower extremity pain: Secondary | ICD-10-CM

## 2021-08-14 DIAGNOSIS — E782 Mixed hyperlipidemia: Secondary | ICD-10-CM | POA: Diagnosis not present

## 2021-08-14 DIAGNOSIS — I739 Peripheral vascular disease, unspecified: Secondary | ICD-10-CM

## 2021-08-14 DIAGNOSIS — J410 Simple chronic bronchitis: Secondary | ICD-10-CM | POA: Diagnosis not present

## 2021-08-14 DIAGNOSIS — M5136 Other intervertebral disc degeneration, lumbar region: Secondary | ICD-10-CM

## 2021-08-14 DIAGNOSIS — Z720 Tobacco use: Secondary | ICD-10-CM

## 2021-08-14 DIAGNOSIS — Z2821 Immunization not carried out because of patient refusal: Secondary | ICD-10-CM

## 2021-08-14 MED ORDER — ALBUTEROL SULFATE HFA 108 (90 BASE) MCG/ACT IN AERS: 2.0000 | INHALATION_SPRAY | Freq: Four times a day (QID) | RESPIRATORY_TRACT | 5 refills | Status: DC | PRN

## 2021-08-14 NOTE — Assessment & Plan Note (Signed)
On aspirin and statin currently Followed by vascular surgery -also has history of subclavian vein stenosis

## 2021-08-14 NOTE — Assessment & Plan Note (Addendum)
Smokes about 0.5 pack/day  Asked about quitting: confirms that he/she currently smokes cigarettes Advise to quit smoking: Educated about QUITTING to reduce the risk of cancer, cardio and cerebrovascular disease. Assess willingness: Unwilling to quit at this time, but is working on cutting back. Assist with counseling and pharmacotherapy: Counseled for 5 minutes and literature provided. Arrange for follow up: follow up in 3 months and continue to offer help. 

## 2021-08-14 NOTE — Assessment & Plan Note (Signed)
BP Readings from Last 1 Encounters:  08/14/21 138/82   Well-controlled Counseled for compliance with the medications Advised DASH diet and moderate exercise/walking, at least 150 mins/week

## 2021-08-14 NOTE — Assessment & Plan Note (Signed)
Unclear etiology, could be lipoma Referred to general surgery

## 2021-08-14 NOTE — Assessment & Plan Note (Signed)
Complains of low back and hip pain Has been seen by Dr. Aline Brochure, and also has completed PT with no relief Referred to spine surgery Avoid heavy lifting and frequent bending Heating pad as needed Tylenol as needed for now

## 2021-08-14 NOTE — Assessment & Plan Note (Signed)
Well controlled with [employer]

## 2021-08-14 NOTE — Progress Notes (Signed)
New Patient Office Visit  Subjective:  Patient ID: Duane Adams, male    DOB: 1956-06-15  Age: 66 y.o. MRN: [id number]  CC:  Chief Complaint  Patient presents with   New Patient (Initial Visit)    New patient was seeing gosrani pt has had hip pain for awhile had xrays they say its coming from back also pt has been feeling very fatigued for a long time     HPI Duane Adams is a 66 y.o. male with past medical history of HTN, CAD s/p stent placement, PAD, HLD, COPD, GERD, liver fibrosis, chronic low back pain and tobacco abuse who presents for establishing care.  CAD, PAD and HTN: BP is well-controlled. Takes medications regularly. Patient denies headache, dizziness, chest pain, or palpitations.  He follows up with cardiology for history of CAD.  He also sees vascular surgery for history of subclavian vein stenosis.  He complains of dyspnea on exertion and severe fatigue, for which she has seen Dr. Halford Chessman.  He was given Symbicort, but has been using it as needed instead of regularly.  Denies any fever, chills, hemoptysis, night sweats or LAD.  He complains of chronic hip pain, for which she has seen orthopedic surgeon - Dr. Aline Brochure.  He was told that he does not have hip arthritis according to x-ray of the hip, but his hip pain is due to lumbar spinal stenosis.  He has not seen spine surgery yet.  He has completed PT with no relief of his hip pain.  He denies any numbness or tingling of the LE currently.  He reports having 2 lumps, 1 on his back and other over his left armpit area.  Lump over his back has been present for many years, which has recently progressed in size and has been causing pain upon touch.   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   Concussion several yrs ago   no residual   Coronary artery disease    a. 03/2013 Cath/PCI: LM nl, LAD 35m(2.5x20 Promus Premier DES), LCX  min irregs, RCA dom, 90p(3.0x20 Promus Premier DES), EF 55-65%.   [place]   slight memory issues   Essential hypertension, benign    GERD (gastroesophageal reflux disease)    Helicobacter pylori gastritis JUN 2016 EGD/Bx   PREVPAK BID FOR 14 DAYS   Hepatitis    Hepatitis C treated  2011   Hiatal hernia    History of kidney stones    Mixed hyperlipidemia    Neuroma    Prediabetes 06/23/2019   Skin cancer    [place]stance [place]    alcoholic quit 23016  Tobacco abuse     Past Surgical History:  Procedure Laterality Date   ABDOMINAL AORTOGRAM W/LOWER EXTREMITY N/A 06/15/2017   Procedure: ABDOMINAL AORTOGRAM W/LOWER EXTREMITY;  Surgeon: DAngelia Mould MD;  [facility];  Service: Cardiovascular;  Laterality: N/A;  Bilateral   BIOPSY N/A 12/19/2014   Procedure: BIOPSY;  Surgeon: SDanie Binder MD;  Location: [facility];  Service: Endoscopy;  Laterality: N/A;   COLONOSCOPY  12/2014   [facility]: with endoscopic mucosal resection. 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 Needs colonoscopy in 1 year.    COLONOSCOPY WITH PROPOFOL N/A 12/19/2014   Dr. FOneida Alar six simple adenomas and 3 hyperplastic polyps. Had  flat mid-transverse colon polyp and referred to [facility] for endoscopic mucosal resection, which is scheduled for July 22   COLONOSCOPY WITH PROPOFOL N/A 04/01/2021   Surgeon: Eloise Harman, DO;Nonbleeding internal hemorrhoids, diverticulosis in the sigmoid colon, tattoo in transverse colon with tattoo site appearing normal.  Recommended repeat colonoscopy in 5 years.   CORONARY ANGIOPLASTY WITH STENT PLACEMENT  04/11/2013   LAD &  RCA     DR COOPER   ESOPHAGOGASTRODUODENOSCOPY (EGD) WITH PROPOFOL N/A 12/19/2014   Dr. Oneida Alar:  without varices, small hiatal hernia noted, moderate non-erosive gastritis and mild duodenitis.  POSITIVE H.PYLORI. Prescribed Prevpac.   EXCISION MORTON'S NEUROMA Left 08/15/2020    Procedure: EXCISION MORTON'S NEUROMA THIRD INTERSPACE LEFT FOOT;  Surgeon: [name], DPM;  Location: [place];  Service: Podiatry;  Laterality: Left;   EXTRACORPOREAL SHOCK WAVE LITHOTRIPSY  2001   FLEXIBLE SIGMOIDOSCOPY N/A 12/23/2017   Procedure: FLEXIBLE SIGMOIDOSCOPY;  Surgeon: Danie Binder, MD;  [facility];  Service: Endoscopy;  Laterality: N/A;  1:45pm   HAND RECONSTRUCTION Left    HEMORRHOID BANDING N/A 12/23/2017   Procedure: HEMORRHOID BANDING;  Surgeon: Danie Binder, MD;  [facility];  Service: Endoscopy;  Laterality: N/A;   LEFT HEART CATH AND CORONARY ANGIOGRAPHY N/A 01/22/2021   Procedure: LEFT HEART CATH AND CORONARY ANGIOGRAPHY;  Surgeon: Martinique, Peter M, MD;  Location: [place] [facility];  Service: Cardiovascular;  Laterality: N/A;   LEFT HEART CATHETERIZATION WITH CORONARY ANGIOGRAM N/A 04/11/2013   Procedure: LEFT HEART CATHETERIZATION WITH CORONARY ANGIOGRAM;  Surgeon: Blane Ohara, MD;  Location: [facility];  Service: Cardiovascular;  Laterality: N/A;   LEG TENDON SURGERY Right    POLYPECTOMY N/A 12/19/2014   Procedure: POLYPECTOMY;  Surgeon: Danie Binder, MD;  Location: [facility];  Service: Endoscopy;  Laterality: N/A;    Family History  Problem Relation Age of Onset   Stroke Mother    Aneurysm Mother    Hypertension Mother    Heart disease Father    Hypertension Father    Heart disease Brother    Hypertension Brother    Alcohol abuse Brother    Diabetes Paternal Aunt    Colon cancer Neg Hx     Social History   Socioeconomic History   Marital status: Soil scientist    Spouse name: Tisha   Number of children: 2   Years of education: 14   Highest education level: Not on file  Occupational History   Occupation: disabled    Comment: heart  Tobacco Use   Smoking status: Light Smoker    Packs/day: 0.75    Years: 50.00    Pack years: 37.50    Types: Cigarettes    Start date: 07/14/1968    Smokeless tobacco: Never   Tobacco comments:    5-6 Cigarettes per day  Vaping Use   Vaping Use: Never used  Substance and Sexual Activity   Alcohol use: Yes    Comment: 5-6 beer every 2 weeks.   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 Negative for chills and fever.  HENT:  Negative for congestion and sore throat.   Eyes:  Negative for pain and discharge.  Respiratory:  Positive for shortness of breath. Negative for cough.   Cardiovascular:  Negative for chest pain and palpitations.  Gastrointestinal:  Negative for diarrhea, nausea and vomiting.  Endocrine: Negative for polydipsia and polyuria.  Genitourinary:  Negative for dysuria and hematuria.  Musculoskeletal:  Positive for arthralgias and back pain. Negative for neck pain and neck stiffness.  Skin:  Negative for rash.  Neurological:  Negative for dizziness, weakness, numbness and headaches.  Psychiatric/Behavioral:  Negative for agitation and behavioral problems.    Objective:   Today's Vitals: BP 138/82 (BP Location: Right Arm, Patient Position: Sitting, Cuff Size: Normal)    Pulse 82    Resp 18    Ht $R'5\' 9"'wQ$  (1.753 m)    Wt 196 lb 0.6 oz (88.9 kg)    SpO2 96%    BMI 28.95 kg/m   Physical Exam Vitals reviewed.  Constitutional:      General: He is not in acute distress.    Appearance: He is not diaphoretic.  HENT:      Head: Normocephalic and atraumatic.     Nose: Nose normal.     Mouth/Throat:     Mouth: Mucous membranes are moist.  Eyes:     General: No scleral icterus.    Extraocular Movements: Extraocular movements intact.  Cardiovascular:     Rate and Rhythm: Normal rate and regular rhythm.     Pulses: Normal pulses.     Heart sounds: Normal heart sounds. No murmur heard. Pulmonary:     Breath sounds: Normal breath sounds. No wheezing or rales.  Abdominal:     Palpations: Abdomen is soft.     Tenderness: There is no abdominal tenderness.  Musculoskeletal:     Cervical back: Neck supple. No tenderness.     Right lower leg: No edema.     Left lower leg: No edema.  Skin:    General: Skin is warm.     Comments: Lump over lower thoracic spine area, around 2 cm in diameter Lump over left axillary area, around 1 cm in diameter  Neurological:     General: No focal deficit present.     Mental Status: He is alert and oriented to person, place, and time.  Psychiatric:        Mood and Affect: Mood normal.        Behavior: Behavior normal.    Assessment & Plan:   Problem List Items Addressed This Visit       Cardiovascular and Mediastinum   Coronary artery disease - Primary    S/p stent placement On aspirin and statin Was on Plavix, was discontinued due to GI bleeding Currently does not have chest pain On beta-blocker, Imdur and amlodipine Followed by Cardiology      Relevant Orders   CMP14+EGFR   CBC with Differential/Platelet   Essential hypertension    BP Readings from Last 1 Encounters:  08/14/21 138/82  Well-controlled Counseled for compliance with the medications Advised DASH diet and moderate exercise/walking, at least 150 mins/week       Relevant Orders   CMP14+EGFR  CBC with Differential/Platelet   PAD (peripheral artery disease) (HCC)    On aspirin and statin currently Followed by vascular surgery -also has history of subclavian vein stenosis         Respiratory   Simple chronic bronchitis (HCC)    Has been seen by Dr. Halford Chessman Plan to get PFTs Was given Symbicort, but has been using it as needed instead of regularly Added albuterol inhaler as needed for dyspnea/wheezing      Relevant Medications   albuterol (VENTOLIN HFA) 108 (90 Base) MCG/ACT inhaler     Digestive   GERD (gastroesophageal reflux disease)    Well controlled with Protoniz      Relevant Orders   CBC with Differential/Platelet     Musculoskeletal and Integument   DDD (degenerative disc disease), lumbar    Complains of low back and hip pain Has been seen by Dr. Aline Brochure, and also has completed PT with no relief Referred to spine surgery Avoid heavy lifting and frequent bending Heating pad as needed Tylenol as needed for now      Relevant Orders   Ambulatory referral to Spine Surgery     Other   Hyperlipidemia    Last lipid profile reviewed Very high TG and LDL On Lipitor currently Will check lipid profile today      Relevant Orders   CMP14+EGFR   Lipid Profile   Tobacco abuse    Smokes about 0.5 pack/day  Asked about quitting: confirms that he/she currently smokes cigarettes Advise to quit smoking: Educated about QUITTING to reduce the risk of cancer, cardio and cerebrovascular disease. Assess willingness: Unwilling to quit at this time, but is working on cutting back. Assist with counseling and pharmacotherapy: Counseled for 5 minutes and literature provided. Arrange for follow up: follow up in 3 months and continue to offer help.      Prediabetes   Relevant Orders   CMP14+EGFR   Hemoglobin A1c   Lump of skin of back    Unclear etiology, could be lipoma Referred to general surgery      Relevant Orders   Ambulatory referral to General Surgery   Other Visit Diagnoses     Refused influenza vaccine           Outpatient Encounter Medications as of 08/14/2021  Medication Sig   albuterol (VENTOLIN HFA) 108 (90 Base) MCG/ACT inhaler  Inhale 2 puffs into the lungs every 6 (six) hours as needed for wheezing or shortness of breath.   amLODipine (NORVASC) 10 MG tablet Take 1 tablet (10 mg total) by mouth daily.   aspirin EC 81 MG tablet Take 81 mg by mouth at bedtime.    chlorthalidone (HYGROTON) 25 MG tablet Take 1 tablet (25 mg total) by mouth daily.   Cholecalciferol (VITAMIN D3) 2000 units TABS Take 4,000 Units by mouth 2 (two) times daily.   cloNIDine (CATAPRES) 0.1 MG tablet TAKE (1) TABLET BY MOUTH TWICE DAILY.   isosorbide mononitrate (IMDUR) 30 MG 24 hr tablet Take 1 tablet (30 mg total) by mouth daily.   metoprolol succinate (TOPROL-XL) 100 MG 24 hr tablet Take 1 tablet (100 mg total) by mouth daily. Take in addition to 15m daily for a total of 158mdaily.   Multiple Vitamin (MULTIVITAMIN WITH MINERALS) TABS tablet Take 1 tablet by mouth daily.   nitroGLYCERIN (NITROSTAT) 0.4 MG SL tablet Place 1 tablet (0.4 mg total) under the tongue every 5 (five) minutes as needed for chest pain.   Omega 3 1200 MG  CAPS Take 2,400 mg by mouth daily.   pantoprazole (PROTONIX) 20 MG tablet Take 1 tablet (20 mg total) by mouth daily before breakfast.   potassium chloride SA (KLOR-CON) 20 MEQ tablet TAKE ONE TABLET BY MOUTH ONCE DAILY. (Patient taking differently: Take 20 mEq by mouth daily.)   atorvastatin (LIPITOR) 80 MG tablet Take 1 tablet (80 mg total) by mouth daily.   budesonide-formoterol (SYMBICORT) 160-4.5 MCG/ACT inhaler Inhale 2 puffs into the lungs 2 (two) times daily. (Patient not taking: Reported on 08/14/2021)   metoprolol succinate (TOPROL-XL) 50 MG 24 hr tablet Take 1 tablet (50 mg total) by mouth at bedtime. Take with or immediately following a meal.   [DISCONTINUED] fluticasone furoate-vilanterol (BREO ELLIPTA) 100-25 MCG/INH AEPB Inhale 1 puff into the lungs daily. (Patient not taking: Reported on 08/14/2021)   No facility-administered encounter medications on file as of 08/14/2021.    Follow-up: Return in about 6 months  (around 02/11/2022) for Annual physical.   Lindell Spar, MD

## 2021-08-14 NOTE — Assessment & Plan Note (Signed)
Has been seen by Dr. Halford Chessman Plan to get PFTs Was given Symbicort, but has been using it as needed instead of regularly Added albuterol inhaler as needed for dyspnea/wheezing

## 2021-08-14 NOTE — Assessment & Plan Note (Addendum)
S/p stent placement On aspirin and statin Was on Plavix, was discontinued due to GI bleeding Currently does not have chest pain On beta-blocker, Imdur and amlodipine Followed by Cardiology 

## 2021-08-14 NOTE — Assessment & Plan Note (Signed)
Last lipid profile reviewed Very high TG and LDL On Lipitor currently Will check lipid profile today

## 2021-08-14 NOTE — Patient Instructions (Signed)
Please continue taking medications as prescribed.  Please use Symbicort regularly and Albuterol inhaler as needed for shortness of breath.  You are being referred to General surgeon for mass on back and armpit.  You are being referred to Spine surgeon for low back pain.

## 2021-08-15 ENCOUNTER — Other Ambulatory Visit: Payer: Self-pay | Admitting: Internal Medicine

## 2021-08-15 ENCOUNTER — Encounter: Payer: Self-pay | Admitting: Surgery

## 2021-08-15 ENCOUNTER — Ambulatory Visit: Payer: Medicaid Other | Admitting: Surgery

## 2021-08-15 VITALS — BP 146/72 | HR 91 | Temp 97.7°F | Resp 16 | Ht 69.0 in | Wt 196.0 lb

## 2021-08-15 DIAGNOSIS — E1169 Type 2 diabetes mellitus with other specified complication: Secondary | ICD-10-CM

## 2021-08-15 DIAGNOSIS — E119 Type 2 diabetes mellitus without complications: Secondary | ICD-10-CM | POA: Insufficient documentation

## 2021-08-15 DIAGNOSIS — R222 Localized swelling, mass and lump, trunk: Secondary | ICD-10-CM | POA: Diagnosis not present

## 2021-08-15 LAB — CBC WITH DIFFERENTIAL/PLATELET
Basophils Absolute: 0.1 10*3/uL (ref 0.0–0.2)
Basos: 1 %
EOS (ABSOLUTE): 0.2 10*3/uL (ref 0.0–0.4)
Eos: 2 %
Hematocrit: 43.8 % (ref 37.5–51.0)
Hemoglobin: 15.5 g/dL (ref 13.0–17.7)
Immature Grans (Abs): 0 10*3/uL (ref 0.0–0.1)
Immature Granulocytes: 0 %
Lymphocytes Absolute: 2.5 10*3/uL (ref 0.7–3.1)
Lymphs: 28 %
MCH: 31.1 pg (ref 26.6–33.0)
MCHC: 35.4 g/dL (ref 31.5–35.7)
MCV: 88 fL (ref 79–97)
Monocytes Absolute: 0.7 10*3/uL (ref 0.1–0.9)
Monocytes: 8 %
Neutrophils Absolute: 5.5 10*3/uL (ref 1.4–7.0)
Neutrophils: 61 %
Platelets: 227 10*3/uL (ref 150–450)
RBC: 4.99 x10E6/uL (ref 4.14–5.80)
RDW: 13.9 % (ref 11.6–15.4)
WBC: 8.9 10*3/uL (ref 3.4–10.8)

## 2021-08-15 LAB — CMP14+EGFR
ALT: 32 IU/L (ref 0–44)
AST: 27 IU/L (ref 0–40)
Albumin/Globulin Ratio: 2 (ref 1.2–2.2)
Albumin: 4.8 g/dL (ref 3.8–4.8)
Alkaline Phosphatase: 59 IU/L (ref 44–121)
BUN/Creatinine Ratio: 15 (ref 10–24)
BUN: 12 mg/dL (ref 8–27)
Bilirubin Total: 0.3 mg/dL (ref 0.0–1.2)
CO2: 24 mmol/L (ref 20–29)
Calcium: 9.5 mg/dL (ref 8.6–10.2)
Chloride: 98 mmol/L (ref 96–106)
Creatinine, Ser: 0.79 mg/dL (ref 0.76–1.27)
Globulin, Total: 2.4 g/dL (ref 1.5–4.5)
Glucose: 93 mg/dL (ref 70–99)
Potassium: 4 mmol/L (ref 3.5–5.2)
Sodium: 140 mmol/L (ref 134–144)
Total Protein: 7.2 g/dL (ref 6.0–8.5)
eGFR: 99 mL/min/{1.73_m2} (ref >59)

## 2021-08-15 LAB — HEMOGLOBIN A1C
Est. average glucose Bld gHb Est-mCnc: 140 mg/dL
Hgb A1c MFr Bld: 6.5 % — ABNORMAL HIGH (ref 4.8–5.6)

## 2021-08-15 LAB — LIPID PANEL
Chol/HDL Ratio: 3.6 ratio (ref 0.0–5.0)
Cholesterol, Total: 163 mg/dL (ref 100–199)
HDL: 45 mg/dL (ref >39)
LDL Chol Calc (NIH): 75 mg/dL (ref 0–99)
Triglycerides: 262 mg/dL — ABNORMAL HIGH (ref 0–149)
VLDL Cholesterol Cal: 43 mg/dL — ABNORMAL HIGH (ref 5–40)

## 2021-08-15 MED ORDER — METFORMIN HCL ER 500 MG PO TB24: 500.0000 mg | ORAL_TABLET | Freq: Every day | ORAL | 0 refills | Status: DC

## 2021-08-15 NOTE — H&P (Signed)
[facility] History and Physical  Reason for Referral: Lump on back Referring Physician: Ihor Dow, MD  Chief Complaint   New Patient (Initial Visit)     Duane Adams is a 66 y.o. male.  HPI: Patient presents for evaluation of 2 back masses.  He states that the one in the midline lower back has been present for 15 years.  Over the last couple of years, it has caused increasing pain when he leans back.  He denies any erythema or drainage from this area.  He also has a mass on the backside of his left shoulder.  This mass has been present for 5 to 6 years, and does not cause him a whole lot of pain.  He has a medical history significant for hypertension, coronary artery disease with stents placed 8 years ago, CVA, GERD, hepatitis, and cirrhosis.  He denies any history of abdominal surgeries or surgeries to remove masses like this.  He does take an 81 mg aspirin, but denies other blood thinner use.  He smokes about 1/2 pack/day and drinks a few beers a few times a couple times a month.  He denies any illegal drug use.  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   Concussion several yrs ago   no residual   Coronary artery disease    a. 03/2013 Cath/PCI: LM nl, LAD 29m(2.5x20 Promus Premier DES), LCX min irregs, RCA dom, 90p(3.0x20 Promus Premier DES), EF 55-65%.   CVA [place]cerebral vascular [place]) [place] 01/[place]   slight memory issues   Essential hypertension, benign    GERD (gastroesophageal reflux disease)    Helicobacter pylori gastritis JUN 2016 EGD/Bx   PREVPAK BID FOR 14 DAYS   Hepatitis    Hepatitis C treated  2011   Hiatal hernia    History of kidney stones    Mixed hyperlipidemia    Neuroma    Prediabetes 06/23/2019   Skin cancer    Substance [place]    alcoholic quit 22952  Tobacco abuse     Past Surgical History:  Procedure Laterality Date    ABDOMINAL AORTOGRAM W/LOWER EXTREMITY N/A 06/15/2017   Procedure: ABDOMINAL AORTOGRAM W/LOWER EXTREMITY;  Surgeon: DAngelia Mould MD;  [facility];  Service: Cardiovascular;  Laterality: N/A;  Bilateral   BIOPSY N/A 12/19/2014   Procedure: BIOPSY;  Surgeon: SDanie Binder MD;  Location: [facility];  Service: Endoscopy;  Laterality: N/A;   COLONOSCOPY  12/2014   [facility]: with endoscopic mucosal resection. 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 Needs colonoscopy in 1 year.    COLONOSCOPY WITH PROPOFOL N/A 12/19/2014   Dr. FOneida Alar six simple adenomas and 3 hyperplastic polyps. Had flat mid-transverse colon polyp [facility] which [facility]   COLONOSCOPY WITH PROPOFOL N/A 04/01/2021   Surgeon: CEloise Harman DO;Nonbleeding internal hemorrhoids, diverticulosis in the sigmoid colon, tattoo in transverse colon with tattoo site appearing normal.  Recommended repeat colonoscopy in 5 years.   CORONARY ANGIOPLASTY WITH STENT PLACEMENT  04/11/2013   LAD &  RCA     DR COOPER   ESOPHAGOGASTRODUODENOSCOPY (EGD) WITH PROPOFOL N/A 12/19/2014   Dr. FOneida Alar  without varices, small hiatal hernia noted, moderate non-erosive gastritis and mild duodenitis.  POSITIVE H.PYLORI. Prescribed Prevpac.   EXCISION MORTON'S NEUROMA  Left 08/15/2020   Procedure: EXCISION MORTON'S NEUROMA THIRD INTERSPACE LEFT FOOT;  Surgeon: [name], DPM;  Location: [facility];  Service: Podiatry;  Laterality: Left;   EXTRACORPOREAL SHOCK WAVE LITHOTRIPSY  2001   FLEXIBLE SIGMOIDOSCOPY N/A 12/23/2017   Procedure: FLEXIBLE SIGMOIDOSCOPY;  Surgeon: Danie Binder, MD;  [facility];  Service: Endoscopy;  Laterality: N/A;  1:45pm   HAND RECONSTRUCTION Left    HEMORRHOID BANDING N/A 12/23/2017   Procedure: HEMORRHOID BANDING;  Surgeon: Danie Binder, MD;  [facility];  Service: Endoscopy;   Laterality: N/A;   LEFT HEART CATH AND CORONARY ANGIOGRAPHY N/A 01/22/2021   Procedure: LEFT HEART CATH AND CORONARY ANGIOGRAPHY;  Surgeon: Martinique, Peter M, MD;  Location: [facility];  Service: Cardiovascular;  Laterality: N/A;   LEFT HEART CATHETERIZATION WITH CORONARY ANGIOGRAM N/A 04/11/2013   Procedure: LEFT HEART CATHETERIZATION WITH CORONARY ANGIOGRAM;  Surgeon: Blane Ohara, MD;  Location: [facility] CATH LAB;  Service: Cardiovascular;  Laterality: N/A;   LEG TENDON SURGERY Right    POLYPECTOMY N/A 12/19/2014   Procedure: POLYPECTOMY;  Surgeon: Danie Binder, MD;  Location: [facility];  Service: Endoscopy;  Laterality: N/A;    Family History  Problem Relation Age of Onset   Stroke Mother    Aneurysm Mother    Hypertension Mother    Heart disease Father    Hypertension Father    Heart disease Brother    Hypertension Brother    Alcohol abuse Brother    Diabetes Paternal Aunt    Colon cancer Neg Hx     Social History   Tobacco Use   Smoking status: Light Smoker    Packs/day: 0.75    Years: 50.00    Pack years: 37.50    Types: Cigarettes    Start date: 07/14/1968   Smokeless tobacco: Never   Tobacco comments:    5-6 Cigarettes per day  Vaping Use   Vaping Use: Never used  Substance Use Topics   Alcohol use: Yes    Comment: 5-6 beer every 2 weeks.   Drug use: Not Currently    Types: Cocaine, Marijuana    Comment: cocaine marijuana and acid last used 1984    Medications: I have reviewed the patient's current medications. Allergies as of 08/15/2021       Reactions   Lisinopril Swelling   Swelling neck   Crestor [rosuvastatin] Other (See Comments)   Severe muscle aches   Chantix [varenicline] Other (See Comments)   Abdominal pain   Neosporin [neomycin-bacitracin Zn-polymyx] Rash   blistered        Medication List        Accurate as of August 15, 2021  3:30 PM. If you have any questions, ask your nurse or doctor.          albuterol 108 (90 Base)  MCG/ACT inhaler Commonly known as: VENTOLIN HFA Inhale 2 puffs into the lungs every 6 (six) hours as needed for wheezing or shortness of breath.   amLODipine 10 MG tablet Commonly known as: NORVASC Take 1 tablet (10 mg total) by mouth daily.   aspirin EC 81 MG tablet Take 81 mg by mouth at bedtime.   atorvastatin 80 MG tablet Commonly known as: LIPITOR Take 1 tablet (80 mg total) by mouth daily.   budesonide-formoterol 160-4.5 MCG/ACT inhaler Commonly known as: Symbicort Inhale 2 puffs into the lungs 2 (two) times daily.   chlorthalidone 25 MG tablet Commonly known as:  HYGROTON Take 1 tablet (25 mg total) by mouth daily.   cloNIDine 0.1 MG tablet Commonly known as: CATAPRES TAKE (1) TABLET BY MOUTH TWICE DAILY.   isosorbide mononitrate 30 MG 24 hr tablet Commonly known as: Imdur Take 1 tablet (30 mg total) by mouth daily.   metFORMIN 500 MG 24 hr tablet Commonly known as: Glucophage XR Take 1 tablet (500 mg total) by mouth daily with breakfast. Started by: Lindell Spar, MD   metoprolol succinate 50 MG 24 hr tablet Commonly known as: TOPROL-XL Take 1 tablet (50 mg total) by mouth at bedtime. Take with or immediately following a meal.   metoprolol succinate 100 MG 24 hr tablet Commonly known as: TOPROL-XL Take 1 tablet (100 mg total) by mouth daily. Take in addition to 45m daily for a total of 1517mdaily.   multivitamin with minerals Tabs tablet Take 1 tablet by mouth daily.   nitroGLYCERIN 0.4 MG SL tablet Commonly known as: Nitrostat Place 1 tablet (0.4 mg total) under the tongue every 5 (five) minutes as needed for chest pain.   Omega 3 1200 MG Caps Take 2,400 mg by mouth daily.   pantoprazole 20 MG tablet Commonly known as: Protonix Take 1 tablet (20 mg total) by mouth daily before breakfast.   potassium chloride SA 20 MEQ tablet Commonly known as: KLOR-CON M TAKE ONE TABLET BY MOUTH ONCE DAILY.   Vitamin D3 50 MCG (2000 UT) Tabs Take 4,000 Units  by mouth 2 (two) times daily.         ROS:  Constitutional: negative for chills, fatigue, and fevers Eyes: negative for visual disturbance and pain Ears, nose, mouth, throat, and face: negative for ear drainage, sore throat, and sinus problems Respiratory: positive for shortness of breath, negative for cough and wheezing Cardiovascular: negative for chest pain and palpitations Gastrointestinal: negative for abdominal pain, nausea, reflux symptoms, and vomiting Genitourinary:positive for frequency, negative for dysuria Integument/breast: negative for dryness and rash Hematologic/lymphatic: negative for bleeding and lymphadenopathy Musculoskeletal:positive for back pain, negative for neck pain and joint pain Neurological: positive for dizziness, negative for tremors and numbness Endocrine: negative for temperature intolerance  Blood pressure (!) 146/72, pulse 91, temperature 97.7 F (36.5 C), temperature source Other (Comment), resp. rate 16, height _0  (1.753 m), weight 196 lb (88.9 kg), SpO2 95 %. Physical Exam Vitals reviewed.  Constitutional:      Appearance: Normal appearance.  HENT:     Head: Normocephalic and atraumatic.  Eyes:     Extraocular Movements: Extraocular movements intact.     Pupils: Pupils are equal, round, and reactive to light.  Cardiovascular:     Rate and Rhythm: Normal rate and regular rhythm.  Pulmonary:     Effort: Pulmonary effort is normal.     Breath sounds: Normal breath sounds.  Abdominal:     General: Abdomen is flat. There is no distension.     Palpations: Abdomen is soft.     Tenderness: There is no abdominal tenderness. There is no guarding.  Musculoskeletal:        General: Normal range of motion.     Cervical back: Normal range of motion.  Skin:    General: Skin is warm and dry.     Comments: Midline low thoracic palpable mass, soft and mobile, 3 cm in size; left posterior shoulder area with palpable mass, soft and mobile, 4 cm in  size  Neurological:     General: No focal deficit present.  Mental Status: He is alert and oriented to person, place, and time.  Psychiatric:        Mood and Affect: Mood normal.        Behavior: Behavior normal.    Results: Results for orders placed or performed in visit on 08/14/21 (from the past 48 hour(s))  CMP14+EGFR     Status: None   Collection Time: 08/14/21  1:45 PM  Result Value Ref Range   Glucose 93 70 - 99 mg/dL   BUN 12 8 - 27 mg/dL   Creatinine, Ser 0.79 0.76 - 1.27 mg/dL   eGFR 99 >59 mL/min/1.73   BUN/Creatinine Ratio 15 10 - 24   Sodium 140 134 - 144 mmol/L   Potassium 4.0 3.5 - 5.2 mmol/L   Chloride 98 96 - 106 mmol/L   CO2 24 20 - 29 mmol/L   Calcium 9.5 8.6 - 10.2 mg/dL   Total Protein 7.2 6.0 - 8.5 g/dL   Albumin 4.8 3.8 - 4.8 g/dL   Globulin, Total 2.4 1.5 - 4.5 g/dL   Albumin/Globulin Ratio 2.0 1.2 - 2.2   Bilirubin Total 0.3 0.0 - 1.2 mg/dL   Alkaline Phosphatase 59 44 - 121 IU/L   AST 27 0 - 40 IU/L   ALT 32 0 - 44 IU/L  Hemoglobin A1c     Status: Abnormal   Collection Time: 08/14/21  1:45 PM  Result Value Ref Range   Hgb A1c MFr Bld 6.5 (H) 4.8 - 5.6 %    Comment:          Prediabetes: 5.7 - 6.4          Diabetes: >6.4          Glycemic control for adults with diabetes: <7.0    Est. average glucose Bld gHb Est-mCnc 140 mg/dL  Lipid Profile     Status: Abnormal   Collection Time: 08/14/21  1:45 PM  Result Value Ref Range   Cholesterol, Total 163 100 - 199 mg/dL   Triglycerides 262 (H) 0 - 149 mg/dL   HDL 45 >39 mg/dL   VLDL Cholesterol Cal 43 (H) 5 - 40 mg/dL   LDL Chol Calc (NIH) 75 0 - 99 mg/dL   Chol/HDL Ratio 3.6 0.0 - 5.0 ratio    Comment:                                   T. Chol/HDL Ratio                                             Men  Women                               1/2 Avg.Risk  3.4    3.3                                   Avg.Risk  5.0    4.4                                2X Avg.Risk  9.6    7.1  3X Avg.Risk 23.4   11.0   CBC with Differential/Platelet     Status: None   Collection Time: 08/14/21  1:45 PM  Result Value Ref Range   WBC 8.9 3.4 - 10.8 x10E3/uL   RBC 4.99 4.14 - 5.80 x10E6/uL   Hemoglobin 15.5 13.0 - 17.7 g/dL   Hematocrit 43.8 37.5 - 51.0 %   MCV 88 79 - 97 fL   MCH 31.1 26.6 - 33.0 pg   MCHC 35.4 31.5 - 35.7 g/dL   RDW 13.9 11.6 - 15.4 %   Platelets 227 150 - 450 x10E3/uL   Neutrophils 61 Not Estab. %   Lymphs 28 Not Estab. %   Monocytes 8 Not Estab. %   Eos 2 Not Estab. %   Basos 1 Not Estab. %   Neutrophils Absolute 5.5 1.4 - 7.0 x10E3/uL   Lymphocytes Absolute 2.5 0.7 - 3.1 x10E3/uL   Monocytes Absolute 0.7 0.1 - 0.9 x10E3/uL   EOS (ABSOLUTE) 0.2 0.0 - 0.4 x10E3/uL   Basophils Absolute 0.1 0.0 - 0.2 x10E3/uL   Immature Granulocytes 0 Not Estab. %   Immature Grans (Abs) 0.0 0.0 - 0.1 x10E3/uL    No results found.   Assessment & Plan:  JELAN BATTERTON is a 66 y.o. male who presents for evaluation of 2 back masses.  -Discussed with patient that since they are intermittently causing him pain, would recommend removal.   -Recommended excision in the operating room, as one of the masses feels deeper, and could be within the muscle -The risks and benefits of excision of back mass x2 was discussed including but not limited to bleeding, infection, seroma formation, injury to surrounding structures, need for additional procedure.  After careful consideration, Stokely Jeancharles has decided to proceed with excision of back mass x2  All questions were answered to the satisfaction of the patient.  Graciella Freer, DO [facility] [address] [place], [place] [address] [phone] (office)

## 2021-08-15 NOTE — Progress Notes (Signed)
[facility] History and Physical ° °Reason for Referral: Lump on back °Referring Physician: Rutwik Patel, MD ° °Chief Complaint   °New Patient (Initial Visit) °  ° ° °Duane Adams is a 65 y.o. male.  °HPI: Patient presents for evaluation of 2 back masses.  He states that the one in the midline lower back has been present for 15 years.  Over the last couple of years, it has caused increasing pain when he leans back.  He denies any erythema or drainage from this area.  He also has a mass on the backside of his left shoulder.  This mass has been present for 5 to 6 years, and does not cause him a whole lot of pain.  He has a medical history significant for hypertension, coronary artery disease with stents placed 8 years ago, CVA, GERD, hepatitis, and cirrhosis.  He denies any history of abdominal surgeries or surgeries to remove masses like this.  He does take an 81 mg aspirin, but denies other blood thinner use.  He smokes about 1/2 pack/day and drinks a few beers a few times a couple times a month.  He denies any illegal drug use. 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  ° Concussion several yrs ago  ° no residual  ° Coronary artery disease   ° a. 03/2013 Cath/PCI: LM nl, LAD 80m (2.5x20 Promus Premier DES), LCX min irregs, RCA dom, 90p(3.0x20 Promus Premier DES), EF 55-65%.  ° CVA (cerebral vascular accident) (HCC) 01/31/2019  ° slight memory issues  ° Essential hypertension, benign   ° GERD (gastroesophageal reflux disease)   ° Helicobacter pylori gastritis JUN 2016 EGD/Bx  ° PREVPAK BID FOR 14 DAYS  ° Hepatitis   ° Hepatitis C treated  2011  ° Hiatal hernia   ° History of kidney stones   ° Mixed hyperlipidemia   ° Neuroma   ° Prediabetes 06/23/2019  ° Skin cancer   ° Substance abuse (HCC)   ° alcoholic quit 2016  ° Tobacco abuse   ° ° °Past Surgical History:  °Procedure Laterality Date  °  ABDOMINAL AORTOGRAM W/LOWER EXTREMITY N/A 06/15/2017  ° Procedure: ABDOMINAL AORTOGRAM W/LOWER EXTREMITY;  Surgeon: Dickson, Christopher S, MD;  Location: [facility];  Service: Cardiovascular;  Laterality: N/A;  Bilateral  ° BIOPSY N/A 12/19/2014  ° Procedure: BIOPSY;  Surgeon: Sandi L Fields, MD;  Location: [facility];  Service: Endoscopy;  Laterality: N/A;  ° COLONOSCOPY  12/2014  ° [facility]: with endoscopic mucosal resection. 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 Needs colonoscopy in 1 year.   ° COLONOSCOPY WITH PROPOFOL N/A 12/19/2014  ° Dr. Fields: six simple adenomas and 3 hyperplastic polyps. Had flat mid-transverse colon polyp and referred to [facility] for endoscopic mucosal resection, which is scheduled for July 22  ° COLONOSCOPY WITH PROPOFOL N/A 04/01/2021  ° Surgeon: Carver, Charles K, DO;Nonbleeding internal hemorrhoids, diverticulosis in the sigmoid colon, tattoo in transverse colon with tattoo site appearing normal.  Recommended repeat colonoscopy in 5 years.  ° CORONARY ANGIOPLASTY WITH STENT PLACEMENT  04/11/2013  ° LAD &  RCA     DR COOPER  ° ESOPHAGOGASTRODUODENOSCOPY (EGD) WITH PROPOFOL N/A 12/19/2014  ° Dr. Fields:  without varices, small hiatal hernia noted, moderate non-erosive gastritis and mild duodenitis.  POSITIVE H.PYLORI. Prescribed Prevpac.  ° EXCISION MORTON'S NEUROMA   Left 08/15/2020   Procedure: EXCISION MORTON'S NEUROMA THIRD INTERSPACE LEFT FOOT;  Surgeon: [name], DPM;  Location: [facility];  Service: Podiatry;  Laterality: Left;   EXTRACORPOREAL SHOCK WAVE LITHOTRIPSY  2001   FLEXIBLE SIGMOIDOSCOPY N/A 12/23/2017   Procedure: FLEXIBLE SIGMOIDOSCOPY;  Surgeon: Danie Binder, MD;  Location: [facility];  Service: Endoscopy;  Laterality: N/A;  1:45pm   HAND RECONSTRUCTION Left    HEMORRHOID BANDING N/A 12/23/2017   Procedure: HEMORRHOID BANDING;  Surgeon: Danie Binder, MD;  Location: [facility];  Service: Endoscopy;   Laterality: N/A;   LEFT HEART CATH AND CORONARY ANGIOGRAPHY N/A 01/22/2021   Procedure: LEFT HEART CATH AND CORONARY ANGIOGRAPHY;  Surgeon: Martinique, Peter M, MD;  Location: [facility];  Service: Cardiovascular;  Laterality: N/A;   LEFT HEART CATHETERIZATION WITH CORONARY ANGIOGRAM N/A 04/11/2013   Procedure: LEFT HEART CATHETERIZATION WITH CORONARY ANGIOGRAM;  Surgeon: Blane Ohara, MD;  Location: [facility];  Service: Cardiovascular;  Laterality: N/A;   LEG TENDON SURGERY Right    POLYPECTOMY N/A 12/19/2014   Procedure: POLYPECTOMY;  Surgeon: Danie Binder, MD;  Location: [facility];  Service: Endoscopy;  Laterality: N/A;    Family History  Problem Relation Age of Onset   Stroke Mother    Aneurysm Mother    Hypertension Mother    Heart disease Father    Hypertension Father    Heart disease Brother    Hypertension Brother    Alcohol abuse Brother    Diabetes Paternal Aunt    Colon cancer Neg Hx     Social History   Tobacco Use   Smoking status: Light Smoker    Packs/day: 0.75    Years: 50.00    Pack years: 37.50    Types: Cigarettes    Start date: 07/14/1968   Smokeless tobacco: Never   Tobacco comments:    5-6 Cigarettes per day  Vaping Use   Vaping Use: Never used  Substance Use Topics   Alcohol use: Yes    Comment: 5-6 beer every 2 weeks.   Drug use: Not Currently    Types: Cocaine, Marijuana    Comment: cocaine marijuana and acid last used 1984    Medications: I have reviewed the patient's current medications. Allergies as of 08/15/2021       Reactions   Lisinopril Swelling   Swelling neck   Crestor [rosuvastatin] Other (See Comments)   Severe muscle aches   Chantix [varenicline] Other (See Comments)   Abdominal pain   Neosporin [neomycin-bacitracin Zn-polymyx] Rash   blistered        Medication List        Accurate as of August 15, 2021  3:30 PM. If you have any questions, ask your nurse or doctor.          albuterol 108 (90 Base)  MCG/ACT inhaler Commonly known as: VENTOLIN HFA Inhale 2 puffs into the lungs every 6 (six) hours as needed for wheezing or shortness of breath.   amLODipine 10 MG tablet Commonly known as: NORVASC Take 1 tablet (10 mg total) by mouth daily.   aspirin EC 81 MG tablet Take 81 mg by mouth at bedtime.   atorvastatin 80 MG tablet Commonly known as: LIPITOR Take 1 tablet (80 mg total) by mouth daily.   budesonide-formoterol 160-4.5 MCG/ACT inhaler Commonly known as: Symbicort Inhale 2 puffs into the lungs 2 (two) times daily.   chlorthalidone 25 MG tablet Commonly known as:  HYGROTON Take 1 tablet (25 mg total) by mouth daily.   cloNIDine 0.1 MG tablet Commonly known as: CATAPRES TAKE (1) TABLET BY MOUTH TWICE DAILY.   isosorbide mononitrate 30 MG 24 hr tablet Commonly known as: Imdur Take 1 tablet (30 mg total) by mouth daily.   metFORMIN 500 MG 24 hr tablet Commonly known as: Glucophage XR Take 1 tablet (500 mg total) by mouth daily with breakfast. Started by: Lindell Spar, MD   metoprolol succinate 50 MG 24 hr tablet Commonly known as: TOPROL-XL Take 1 tablet (50 mg total) by mouth at bedtime. Take with or immediately following a meal.   metoprolol succinate 100 MG 24 hr tablet Commonly known as: TOPROL-XL Take 1 tablet (100 mg total) by mouth daily. Take in addition to 45m daily for a total of 1517mdaily.   multivitamin with minerals Tabs tablet Take 1 tablet by mouth daily.   nitroGLYCERIN 0.4 MG SL tablet Commonly known as: Nitrostat Place 1 tablet (0.4 mg total) under the tongue every 5 (five) minutes as needed for chest pain.   Omega 3 1200 MG Caps Take 2,400 mg by mouth daily.   pantoprazole 20 MG tablet Commonly known as: Protonix Take 1 tablet (20 mg total) by mouth daily before breakfast.   potassium chloride SA 20 MEQ tablet Commonly known as: KLOR-CON M TAKE ONE TABLET BY MOUTH ONCE DAILY.   Vitamin D3 50 MCG (2000 UT) Tabs Take 4,000 Units  by mouth 2 (two) times daily.         ROS:  Constitutional: negative for chills, fatigue, and fevers Eyes: negative for visual disturbance and pain Ears, nose, mouth, throat, and face: negative for ear drainage, sore throat, and sinus problems Respiratory: positive for shortness of breath, negative for cough and wheezing Cardiovascular: negative for chest pain and palpitations Gastrointestinal: negative for abdominal pain, nausea, reflux symptoms, and vomiting Genitourinary:positive for frequency, negative for dysuria Integument/breast: negative for dryness and rash Hematologic/lymphatic: negative for bleeding and lymphadenopathy Musculoskeletal:positive for back pain, negative for neck pain and joint pain Neurological: positive for dizziness, negative for tremors and numbness Endocrine: negative for temperature intolerance  Blood pressure (!) 146/72, pulse 91, temperature 97.7 F (36.5 C), temperature source Other (Comment), resp. rate 16, height _0  (1.753 m), weight 196 lb (88.9 kg), SpO2 95 %. Physical Exam Vitals reviewed.  Constitutional:      Appearance: Normal appearance.  HENT:     Head: Normocephalic and atraumatic.  Eyes:     Extraocular Movements: Extraocular movements intact.     Pupils: Pupils are equal, round, and reactive to light.  Cardiovascular:     Rate and Rhythm: Normal rate and regular rhythm.  Pulmonary:     Effort: Pulmonary effort is normal.     Breath sounds: Normal breath sounds.  Abdominal:     General: Abdomen is flat. There is no distension.     Palpations: Abdomen is soft.     Tenderness: There is no abdominal tenderness. There is no guarding.  Musculoskeletal:        General: Normal range of motion.     Cervical back: Normal range of motion.  Skin:    General: Skin is warm and dry.     Comments: Midline low thoracic palpable mass, soft and mobile, 3 cm in size; left posterior shoulder area with palpable mass, soft and mobile, 4 cm in  size  Neurological:     General: No focal deficit present.  Mental Status: He is alert and oriented to person, place, and time.  Psychiatric:        Mood and Affect: Mood normal.        Behavior: Behavior normal.    Results: Results for orders placed or performed in visit on 08/14/21 (from the past 48 hour(s))  CMP14+EGFR     Status: None   Collection Time: 08/14/21  1:45 PM  Result Value Ref Range   Glucose 93 70 - 99 mg/dL   BUN 12 8 - 27 mg/dL   Creatinine, Ser 0.79 0.76 - 1.27 mg/dL   eGFR 99 >59 mL/min/1.73   BUN/Creatinine Ratio 15 10 - 24   Sodium 140 134 - 144 mmol/L   Potassium 4.0 3.5 - 5.2 mmol/L   Chloride 98 96 - 106 mmol/L   CO2 24 20 - 29 mmol/L   Calcium 9.5 8.6 - 10.2 mg/dL   Total Protein 7.2 6.0 - 8.5 g/dL   Albumin 4.8 3.8 - 4.8 g/dL   Globulin, Total 2.4 1.5 - 4.5 g/dL   Albumin/Globulin Ratio 2.0 1.2 - 2.2   Bilirubin Total 0.3 0.0 - 1.2 mg/dL   Alkaline Phosphatase 59 44 - 121 IU/L   AST 27 0 - 40 IU/L   ALT 32 0 - 44 IU/L  Hemoglobin A1c     Status: Abnormal   Collection Time: 08/14/21  1:45 PM  Result Value Ref Range   Hgb A1c MFr Bld 6.5 (H) 4.8 - 5.6 %    Comment:          Prediabetes: 5.7 - 6.4          Diabetes: >6.4          Glycemic control for adults with diabetes: <7.0    Est. average glucose Bld gHb Est-mCnc 140 mg/dL  Lipid Profile     Status: Abnormal   Collection Time: 08/14/21  1:45 PM  Result Value Ref Range   Cholesterol, Total 163 100 - 199 mg/dL   Triglycerides 262 (H) 0 - 149 mg/dL   HDL 45 >39 mg/dL   VLDL Cholesterol Cal 43 (H) 5 - 40 mg/dL   LDL Chol Calc (NIH) 75 0 - 99 mg/dL   Chol/HDL Ratio 3.6 0.0 - 5.0 ratio    Comment:                                   T. Chol/HDL Ratio                                             Men  Women                               1/2 Avg.Risk  3.4    3.3                                   Avg.Risk  5.0    4.4                                2X Avg.Risk  9.6    7.1  3X Avg.Risk 23.4   11.0 °  °CBC with Differential/Platelet     Status: None  ° Collection Time: 08/14/21  1:45 PM  °Result Value Ref Range  ° WBC 8.9 3.4 - 10.8 x10E3/uL  ° RBC 4.99 4.14 - 5.80 x10E6/uL  ° Hemoglobin 15.5 13.0 - 17.7 g/dL  ° Hematocrit 43.8 37.5 - 51.0 %  ° MCV 88 79 - 97 fL  ° MCH 31.1 26.6 - 33.0 pg  ° MCHC 35.4 31.5 - 35.7 g/dL  ° RDW 13.9 11.6 - 15.4 %  ° Platelets 227 150 - 450 x10E3/uL  ° Neutrophils 61 Not Estab. %  ° Lymphs 28 Not Estab. %  ° Monocytes 8 Not Estab. %  ° Eos 2 Not Estab. %  ° Basos 1 Not Estab. %  ° Neutrophils Absolute 5.5 1.4 - 7.0 x10E3/uL  ° Lymphocytes Absolute 2.5 0.7 - 3.1 x10E3/uL  ° Monocytes Absolute 0.7 0.1 - 0.9 x10E3/uL  ° EOS (ABSOLUTE) 0.2 0.0 - 0.4 x10E3/uL  ° Basophils Absolute 0.1 0.0 - 0.2 x10E3/uL  ° Immature Granulocytes 0 Not Estab. %  ° Immature Grans (Abs) 0.0 0.0 - 0.1 x10E3/uL  ° ° °No results found. ° ° °Assessment & Plan:  °Duane Adams is a 65 y.o. male who presents for evaluation of 2 back masses. ° °-Discussed with patient that since they are intermittently causing him pain, would recommend removal.   °-Recommended excision in the operating room, as one of the masses feels deeper, and could be within the muscle °-The risks and benefits of excision of back mass x2 was discussed including but not limited to bleeding, infection, seroma formation, injury to surrounding structures, need for additional procedure.  After careful consideration, Duane Adams has decided to proceed with excision of back mass x2 ° °All questions were answered to the satisfaction of the patient. ° °Chalet Kerwin, DO °[facility] °[address] °[place], [place] [address] °[phone] (office) °

## 2021-08-23 DIAGNOSIS — Z6828 Body mass index (BMI) 28.0-28.9, adult: Secondary | ICD-10-CM | POA: Diagnosis not present

## 2021-08-23 DIAGNOSIS — M48062 Spinal stenosis, lumbar region with neurogenic claudication: Secondary | ICD-10-CM | POA: Diagnosis not present

## 2021-08-28 ENCOUNTER — Other Ambulatory Visit (HOSPITAL_COMMUNITY): Payer: Medicaid Other

## 2021-09-02 ENCOUNTER — Other Ambulatory Visit (HOSPITAL_COMMUNITY): Payer: Self-pay | Admitting: Neurological Surgery

## 2021-09-02 DIAGNOSIS — M48062 Spinal stenosis, lumbar region with neurogenic claudication: Secondary | ICD-10-CM

## 2021-09-06 NOTE — Patient Instructions (Signed)
Duane Adams  09/06/2021     @PREFPERIOPPHARMACY @   Your procedure is scheduled on  09/13/2021.   Report to [facility] at  0600  A.M.   Call this number if you have problems the morning of surgery:  [phone]   Remember:  Do not eat or drink after midnight.              DO NOT take any medications for diabetes the morning of your procedure.     Use your inhaler before you come and bring your rescue inhaler with you.     Take these medicines the morning of surgery with A SIP OF WATER        amlodipine, clonidine, isosorbide, metoprolol, protonix.     Do not wear jewelry, make-up or nail polish.  Do not wear lotions, powders, or perfumes, or deodorant.  Do not shave 48 hours prior to surgery.  Men may shave face and neck.  Do not bring valuables to the hospital.  [facility] is not responsible for any belongings or valuables.  Contacts, dentures or bridgework may not be worn into surgery.  Leave your suitcase in the car.  After surgery it may be brought to your room.  For patients admitted to the hospital, discharge time will be determined by your treatment team.  Patients discharged the day of surgery will not be allowed to drive home and must have someone with them for 24 hours.    Special instructions:   DO NOT smoke tobacco or vape for 24 huors before your procedure.  Please read over the following fact sheets that you were given. Coughing and Deep Breathing, Surgical Site Infection Prevention, Anesthesia Post-op Instructions, and Care and Recovery After Surgery      Lipoma Removal, Care After This sheet gives you information about how to care for yourself after your procedure. Your health care provider may also give you more specific instructions. If you have problems or questions, contact your health care provider. What can I expect after the procedure? After the procedure, it is common to have: Mild pain. Swelling. Bruising. Follow these  instructions at home: Bathing  Do not take baths, swim, or use a hot tub until your health care provider approves. Ask your health care provider if you may take showers. You may only be allowed to take sponge baths. Keep your bandage (dressing) dry until your health care provider says it can be removed. Incision care  Follow instructions from your health care provider about how to take care of your incision. Make sure you: Wash your hands with soap and water for at least 20 seconds before and after you change your dressing. If soap and water are not available, use hand sanitizer. Change your dressing as told by your health care provider. Leave stitches (sutures), skin glue, or adhesive strips in place. These skin closures may need to stay in place for 2 weeks or longer. If adhesive strip edges start to loosen and curl up, you may trim the loose edges. Do not remove adhesive strips completely unless your health care provider tells you to do that. Check your incision area every day for signs of infection. Check for: More redness, swelling, or pain. Fluid or blood. Warmth. Pus or a bad smell. Medicines Take over-the-counter and prescription medicines only as told by your health care provider. If you were prescribed an antibiotic medicine, use it as told by your health care provider.  Do not stop using the antibiotic even if you start to feel better. General instructions  If you were given a sedative during the procedure, it can affect you for several hours. Do not drive or operate machinery until your health care provider says that it is safe. Do not use any products that contain nicotine or tobacco, such as cigarettes, e-cigarettes, and chewing tobacco. These can delay healing. If you need help quitting, ask your health care provider. Return to your normal activities as told by your health care provider. Ask your health care provider what activities are safe for you. Keep all follow-up visits as  told by your health care provider. This is important. Contact a health care provider if: You have more redness, swelling, or pain around your incision. You have fluid or blood coming from your incision. Your incision feels warm to the touch. You have pus or a bad smell coming from your incision. You have pain that does not get better with medicine. Get help right away if: You have chills or a fever. You have severe pain. Summary After the procedure, it is common to have mild pain, swelling, and bruising. Follow instructions from your health care provider about how to take care of your incision. Check your incision area every day for signs of infection. Contact a health care provider if you have more redness, swelling, or pain around your incision. This information is not intended to replace advice given to you by your health care provider. Make sure you discuss any questions you have with your health care provider. Document Revised: 02/14/2019 Document Reviewed: 02/14/2019 Elsevier Patient Education  [place] After This sheet gives you information about how to care for yourself after your procedure. Your health care provider may also give you more specific instructions. If you have problems or questions, contact your health care provider. What can I expect after the procedure? After the procedure, it is common to have: Tiredness. Forgetfulness about what happened after the procedure. Impaired judgment for important decisions. Nausea or vomiting. Some difficulty with balance. Follow these instructions at home: For the time period you were told by your health care provider:   Rest as needed. Do not participate in activities where you could fall or become injured. Do not drive or use machinery. Do not drink alcohol. Do not take sleeping pills or medicines that cause drowsiness. Do not make important decisions or sign legal documents. Do not  take care of children on your own. Eating and drinking Follow the diet that is recommended by your health care provider. Drink enough fluid to keep your urine pale yellow. If you vomit: Drink water, juice, or soup when you can drink without vomiting. Make sure you have little or no nausea before eating solid foods. General instructions Have a responsible adult stay with you for the time you are told. It is important to have someone help care for you until you are awake and alert. Take over-the-counter and prescription medicines only as told by your health care provider. If you have sleep apnea, surgery and certain medicines can increase your risk for breathing problems. Follow instructions from your health care provider about wearing your sleep device: Anytime you are sleeping, including during daytime naps. While taking prescription pain medicines, sleeping medicines, or medicines that make you drowsy. Avoid smoking. Keep all follow-up visits as told by your health care provider. This is important. Contact a health care provider if: You keep feeling nauseous or  you keep vomiting. You feel light-headed. You are still sleepy or having trouble with balance after 24 hours. You develop a rash. You have a fever. You have redness or swelling around the IV site. Get help right away if: You have trouble breathing. You have new-onset confusion at home. Summary For several hours after your procedure, you may feel tired. You may also be forgetful and have poor judgment. Have a responsible adult stay with you for the time you are told. It is important to have someone help care for you until you are awake and alert. Rest as told. Do not drive or operate machinery. Do not drink alcohol or take sleeping pills. Get help right away if you have trouble breathing, or if you suddenly become confused. This information is not intended to replace advice given to you by your health care provider. Make sure you  discuss any questions you have with your health care provider. Document Revised: 03/15/2020 Document Reviewed: 06/02/2019 Elsevier Patient Education  2022 [employer]. How to Use Chlorhexidine for Bathing Chlorhexidine gluconate (CHG) is a germ-killing (antiseptic) solution that is used to clean the skin. It can get rid of the bacteria that normally live on the skin and can keep them away for about 24 hours. To clean your skin with CHG, you may be given: A CHG solution to use in the shower or as part of a sponge bath. A prepackaged cloth that contains CHG. Cleaning your skin with CHG may help lower the risk for infection: While you are staying in the intensive care unit of the hospital. If you have a vascular access, such as a central line, to provide short-term or long-term access to your veins. If you have a catheter to drain urine from your bladder. If you are on a ventilator. A ventilator is a machine that helps you breathe by moving air in and out of your lungs. After surgery. What are the risks? Risks of using CHG include: A skin reaction. Hearing loss, if CHG gets in your ears and you have a perforated eardrum. Eye injury, if CHG gets in your eyes and is not rinsed out. The CHG product catching fire. Make sure that you avoid smoking and flames after applying CHG to your skin. Do not use CHG: If you have a chlorhexidine allergy or have previously reacted to chlorhexidine. On babies younger than 99 months of age. How to use CHG solution Use CHG only as told by your health care provider, and follow the instructions on the label. Use the full amount of CHG as directed. Usually, this is one bottle. During a shower Follow these steps when using CHG solution during a shower (unless your health care provider gives you different instructions): Start the shower. Use your normal soap and shampoo to wash your face and hair. Turn off the shower or move out of the shower stream. Pour the CHG  onto a clean washcloth. Do not use any type of brush or rough-edged sponge. Starting at your neck, lather your body down to your toes. Make sure you follow these instructions: If you will be having surgery, pay special attention to the part of your body where you will be having surgery. Scrub this area for at least 1 minute. Do not use CHG on your head or face. If the solution gets into your ears or eyes, rinse them well with water. Avoid your genital area. Avoid any areas of skin that have broken skin, cuts, or scrapes. Scrub your back  and under your arms. Make sure to wash skin folds. Let the lather sit on your skin for 1-2 minutes or as long as told by your health care provider. Thoroughly rinse your entire body in the shower. Make sure that all body creases and crevices are rinsed well. Dry off with a clean towel. Do not put any substances on your body afterward--such as powder, lotion, or perfume--unless you are told to do so by your health care provider. Only use lotions that are recommended by the manufacturer. Put on clean clothes or pajamas. If it is the night before your surgery, sleep in clean sheets.  During a sponge bath Follow these steps when using CHG solution during a sponge bath (unless your health care provider gives you different instructions): Use your normal soap and shampoo to wash your face and hair. Pour the CHG onto a clean washcloth. Starting at your neck, lather your body down to your toes. Make sure you follow these instructions: If you will be having surgery, pay special attention to the part of your body where you will be having surgery. Scrub this area for at least 1 minute. Do not use CHG on your head or face. If the solution gets into your ears or eyes, rinse them well with water. Avoid your genital area. Avoid any areas of skin that have broken skin, cuts, or scrapes. Scrub your back and under your arms. Make sure to wash skin folds. Let the lather sit on  your skin for 1-2 minutes or as long as told by your health care provider. Using a different clean, wet washcloth, thoroughly rinse your entire body. Make sure that all body creases and crevices are rinsed well. Dry off with a clean towel. Do not put any substances on your body afterward--such as powder, lotion, or perfume--unless you are told to do so by your health care provider. Only use lotions that are recommended by the manufacturer. Put on clean clothes or pajamas. If it is the night before your surgery, sleep in clean sheets. How to use CHG prepackaged cloths Only use CHG cloths as told by your health care provider, and follow the instructions on the label. Use the CHG cloth on clean, dry skin. Do not use the CHG cloth on your head or face unless your health care provider tells you to. When washing with the CHG cloth: Avoid your genital area. Avoid any areas of skin that have broken skin, cuts, or scrapes. Before surgery Follow these steps when using a CHG cloth to clean before surgery (unless your health care provider gives you different instructions): Using the CHG cloth, vigorously scrub the part of your body where you will be having surgery. Scrub using a back-and-forth motion for 3 minutes. The area on your body should be completely wet with CHG when you are done scrubbing. Do not rinse. Discard the cloth and let the area air-dry. Do not put any substances on the area afterward, such as powder, lotion, or perfume. Put on clean clothes or pajamas. If it is the night before your surgery, sleep in clean sheets.  For general bathing Follow these steps when using CHG cloths for general bathing (unless your health care provider gives you different instructions). Use a separate CHG cloth for each area of your body. Make sure you wash between any folds of skin and between your fingers and toes. Wash your body in the following order, switching to a new cloth after each step: The front of  your  neck, shoulders, and chest. Both of your arms, under your arms, and your hands. Your stomach and groin area, avoiding the genitals. Your right leg and foot. Your left leg and foot. The back of your neck, your back, and your buttocks. Do not rinse. Discard the cloth and let the area air-dry. Do not put any substances on your body afterward--such as powder, lotion, or perfume--unless you are told to do so by your health care provider. Only use lotions that are recommended by the manufacturer. Put on clean clothes or pajamas. Contact a health care provider if: Your skin gets irritated after scrubbing. You have questions about using your solution or cloth. You swallow any chlorhexidine. Call your local poison control center (1-5875194719 in the [place] Get help right away if: Your eyes itch badly, or they become very red or swollen. Your skin itches badly and is red or swollen. Your hearing changes. You have trouble seeing. You have swelling or tingling in your mouth or throat. You have trouble breathing. These symptoms may represent a serious problem that is an emergency. Do not wait to see if the symptoms will go away. Get medical help right away. Call your local emergency services (911 in the [place] Do not drive yourself to the hospital. Summary Chlorhexidine gluconate (CHG) is a germ-killing (antiseptic) solution that is used to clean the skin. Cleaning your skin with CHG may help to lower your risk for infection. You may be given CHG to use for bathing. It may be in a bottle or in a prepackaged cloth to use on your skin. Carefully follow your health care provider's instructions and the instructions on the product label. Do not use CHG if you have a chlorhexidine allergy. Contact your health care provider if your skin gets irritated after scrubbing. This information is not intended to replace advice given to you by your health care provider. Make sure you discuss any questions you have  with your health care provider. Document Revised: 09/10/2020 Document Reviewed: 09/10/2020 Elsevier Patient Education  2022 [employer].

## 2021-09-10 ENCOUNTER — Encounter (HOSPITAL_COMMUNITY)
Admission: RE | Admit: 2021-09-10 | Discharge: 2021-09-10 | Disposition: A | Discharge status: Home / Self Care | Payer: Medicaid Other | Source: Ambulatory Visit | Attending: Surgery | Admitting: Surgery

## 2021-09-13 ENCOUNTER — Encounter (HOSPITAL_COMMUNITY): Payer: Self-pay | Admitting: Surgery

## 2021-09-13 ENCOUNTER — Encounter (HOSPITAL_COMMUNITY)
Admission: RE | Disposition: A | Discharge status: Home / Self Care | Payer: Self-pay | Source: Home / Self Care | Attending: Surgery

## 2021-09-13 ENCOUNTER — Ambulatory Visit (HOSPITAL_COMMUNITY): Payer: Medicaid Other | Admitting: Anesthesiology

## 2021-09-13 ENCOUNTER — Ambulatory Visit (HOSPITAL_COMMUNITY)
Admission: RE | Admit: 2021-09-13 | Discharge: 2021-09-13 | Disposition: A | Discharge status: Home / Self Care | Payer: Medicaid Other | Attending: Surgery | Admitting: Surgery

## 2021-09-13 ENCOUNTER — Ambulatory Visit (HOSPITAL_BASED_OUTPATIENT_CLINIC_OR_DEPARTMENT_OTHER): Payer: Medicaid Other | Admitting: Anesthesiology

## 2021-09-13 ENCOUNTER — Other Ambulatory Visit: Payer: Self-pay

## 2021-09-13 DIAGNOSIS — L723 Sebaceous cyst: Secondary | ICD-10-CM

## 2021-09-13 DIAGNOSIS — I10 Essential (primary) hypertension: Secondary | ICD-10-CM

## 2021-09-13 DIAGNOSIS — I2510 Atherosclerotic heart disease of native coronary artery without angina pectoris: Secondary | ICD-10-CM | POA: Insufficient documentation

## 2021-09-13 DIAGNOSIS — E1151 Type 2 diabetes mellitus with diabetic peripheral angiopathy without gangrene: Secondary | ICD-10-CM | POA: Diagnosis not present

## 2021-09-13 DIAGNOSIS — Z955 Presence of coronary angioplasty implant and graft: Secondary | ICD-10-CM | POA: Diagnosis not present

## 2021-09-13 DIAGNOSIS — Z8673 Personal history of transient ischemic attack (TIA), and cerebral infarction without residual deficits: Secondary | ICD-10-CM | POA: Insufficient documentation

## 2021-09-13 DIAGNOSIS — I25119 Atherosclerotic heart disease of native coronary artery with unspecified angina pectoris: Secondary | ICD-10-CM

## 2021-09-13 DIAGNOSIS — G709 Myoneural disorder, unspecified: Secondary | ICD-10-CM | POA: Diagnosis not present

## 2021-09-13 DIAGNOSIS — F419 Anxiety disorder, unspecified: Secondary | ICD-10-CM | POA: Insufficient documentation

## 2021-09-13 DIAGNOSIS — K219 Gastro-esophageal reflux disease without esophagitis: Secondary | ICD-10-CM | POA: Insufficient documentation

## 2021-09-13 DIAGNOSIS — F17210 Nicotine dependence, cigarettes, uncomplicated: Secondary | ICD-10-CM | POA: Insufficient documentation

## 2021-09-13 DIAGNOSIS — K7460 Unspecified cirrhosis of liver: Secondary | ICD-10-CM | POA: Insufficient documentation

## 2021-09-13 DIAGNOSIS — L720 Epidermal cyst: Secondary | ICD-10-CM | POA: Diagnosis not present

## 2021-09-13 DIAGNOSIS — Z8619 Personal history of other infectious and parasitic diseases: Secondary | ICD-10-CM | POA: Insufficient documentation

## 2021-09-13 DIAGNOSIS — L728 Other follicular cysts of the skin and subcutaneous tissue: Secondary | ICD-10-CM | POA: Diagnosis not present

## 2021-09-13 HISTORY — PX: MASS EXCISION: SHX2000

## 2021-09-13 HISTORY — PX: LIPOMA EXCISION: SHX5283

## 2021-09-13 LAB — GLUCOSE, CAPILLARY: Glucose-Capillary: 120 mg/dL — ABNORMAL HIGH (ref 70–99)

## 2021-09-13 SURGERY — EXCISION LIPOMA
Anesthesia: General | Site: Shoulder

## 2021-09-13 MED ORDER — SUGAMMADEX SODIUM 200 MG/2ML IV SOLN
INTRAVENOUS | Status: DC | PRN
  Administered 2021-09-13: 172.4 mg via INTRAVENOUS

## 2021-09-13 MED ORDER — CHLORHEXIDINE GLUCONATE CLOTH 2 % EX PADS: 6.0000 | MEDICATED_PAD | Freq: Once | CUTANEOUS | Status: DC

## 2021-09-13 MED ORDER — POVIDONE-IODINE 10 % EX OINT
TOPICAL_OINTMENT | CUTANEOUS | Status: AC
  Filled 2021-09-13: qty 1

## 2021-09-13 MED ORDER — ACETAMINOPHEN 500 MG PO TABS: 1000.0000 mg | ORAL_TABLET | Freq: Four times a day (QID) | ORAL | 0 refills | Status: AC

## 2021-09-13 MED ORDER — DEXAMETHASONE SODIUM PHOSPHATE 4 MG/ML IJ SOLN
INTRAMUSCULAR | Status: DC | PRN
  Administered 2021-09-13: 10 mg via INTRAVENOUS

## 2021-09-13 MED ORDER — MIDAZOLAM HCL 2 MG/2ML IJ SOLN
INTRAMUSCULAR | Status: AC
  Filled 2021-09-13: qty 2

## 2021-09-13 MED ORDER — DOCUSATE SODIUM 100 MG PO CAPS: 100.0000 mg | ORAL_CAPSULE | Freq: Two times a day (BID) | ORAL | 0 refills | Status: AC

## 2021-09-13 MED ORDER — PROPOFOL 10 MG/ML IV BOLUS
INTRAVENOUS | Status: AC
  Filled 2021-09-13: qty 20

## 2021-09-13 MED ORDER — OXYCODONE HCL 5 MG PO TABS: 5.0000 mg | ORAL_TABLET | Freq: Three times a day (TID) | ORAL | 0 refills | Status: DC | PRN

## 2021-09-13 MED ORDER — MEPERIDINE HCL 50 MG/ML IJ SOLN: 6.2500 mg | INTRAMUSCULAR | Status: DC | PRN

## 2021-09-13 MED ORDER — BUPIVACAINE HCL (PF) 0.5 % IJ SOLN
INTRAMUSCULAR | Status: DC | PRN
  Administered 2021-09-13: 20 mL

## 2021-09-13 MED ORDER — LIDOCAINE HCL (CARDIAC) PF 100 MG/5ML IV SOSY
PREFILLED_SYRINGE | INTRAVENOUS | Status: DC | PRN
  Administered 2021-09-13: 60 mg via INTRATRACHEAL

## 2021-09-13 MED ORDER — MIDAZOLAM HCL 5 MG/5ML IJ SOLN
INTRAMUSCULAR | Status: DC | PRN
  Administered 2021-09-13: 2 mg via INTRAVENOUS

## 2021-09-13 MED ORDER — LACTATED RINGERS IV SOLN
INTRAVENOUS | Status: DC
  Administered 2021-09-13: 1000 mL via INTRAVENOUS

## 2021-09-13 MED ORDER — POVIDONE-IODINE 10 % OINT PACKET
TOPICAL_OINTMENT | CUTANEOUS | Status: DC | PRN
  Administered 2021-09-13: 1 via TOPICAL

## 2021-09-13 MED ORDER — ONDANSETRON HCL 4 MG/2ML IJ SOLN
INTRAMUSCULAR | Status: DC | PRN
  Administered 2021-09-13: 4 mg via INTRAVENOUS

## 2021-09-13 MED ORDER — ONDANSETRON HCL 4 MG/2ML IJ SOLN: 4.0000 mg | Freq: Once | INTRAMUSCULAR | Status: DC | PRN

## 2021-09-13 MED ORDER — ROCURONIUM BROMIDE 100 MG/10ML IV SOLN
INTRAVENOUS | Status: DC | PRN
  Administered 2021-09-13: 10 mg via INTRAVENOUS
  Administered 2021-09-13: 30 mg via INTRAVENOUS

## 2021-09-13 MED ORDER — CHLORHEXIDINE GLUCONATE 0.12 % MT SOLN
15.0000 mL | Freq: Once | OROMUCOSAL | Status: AC
  Administered 2021-09-13: 15 mL via OROMUCOSAL

## 2021-09-13 MED ORDER — PROPOFOL 10 MG/ML IV BOLUS
INTRAVENOUS | Status: DC | PRN
  Administered 2021-09-13: 30 mg via INTRAVENOUS
  Administered 2021-09-13: 140 mg via INTRAVENOUS
  Administered 2021-09-13: 20 mg via INTRAVENOUS

## 2021-09-13 MED ORDER — FENTANYL CITRATE (PF) 100 MCG/2ML IJ SOLN
INTRAMUSCULAR | Status: DC | PRN
  Administered 2021-09-13: 100 ug via INTRAVENOUS
  Administered 2021-09-13 (×2): 50 ug via INTRAVENOUS

## 2021-09-13 MED ORDER — ORAL CARE MOUTH RINSE: 15.0000 mL | Freq: Once | OROMUCOSAL | Status: AC

## 2021-09-13 MED ORDER — FENTANYL CITRATE (PF) 100 MCG/2ML IJ SOLN
INTRAMUSCULAR | Status: AC
  Filled 2021-09-13: qty 2

## 2021-09-13 MED ORDER — HYDROMORPHONE HCL 1 MG/ML IJ SOLN: 0.2500 mg | INTRAMUSCULAR | Status: DC | PRN

## 2021-09-13 MED ORDER — CEFAZOLIN SODIUM-DEXTROSE 2-4 GM/100ML-% IV SOLN
2.0000 g | INTRAVENOUS | Status: AC
  Administered 2021-09-13: 2 g via INTRAVENOUS

## 2021-09-13 MED ORDER — 0.9 % SODIUM CHLORIDE (POUR BTL) OPTIME
TOPICAL | Status: DC | PRN
  Administered 2021-09-13: 1000 mL

## 2021-09-13 MED ORDER — CEFAZOLIN SODIUM-DEXTROSE 2-4 GM/100ML-% IV SOLN
INTRAVENOUS | Status: AC
  Filled 2021-09-13: qty 100

## 2021-09-13 MED ORDER — BUPIVACAINE HCL (PF) 0.5 % IJ SOLN
INTRAMUSCULAR | Status: AC
  Filled 2021-09-13: qty 30

## 2021-09-13 SURGICAL SUPPLY — 30 items
APL PRP STRL LF DISP 70% ISPRP (MISCELLANEOUS) ×2
CHLORAPREP W/TINT 26 (MISCELLANEOUS) ×3 IMPLANT
CLOTH BEACON ORANGE TIMEOUT ST (SAFETY) ×3 IMPLANT
COVER LIGHT HANDLE STERIS (MISCELLANEOUS) ×6 IMPLANT
DECANTER SPIKE VIAL GLASS SM (MISCELLANEOUS) ×3 IMPLANT
DRAPE CHEST BREAST 15X10 FENES (DRAPES) ×1 IMPLANT
DRAPE HALF SHEET 40X57 (DRAPES) ×1 IMPLANT
DRSG TELFA 3X8 NADH (GAUZE/BANDAGES/DRESSINGS) ×3 IMPLANT
ELECT REM PT RETURN 9FT ADLT (ELECTROSURGICAL) ×3
ELECTRODE REM PT RTRN 9FT ADLT (ELECTROSURGICAL) ×2 IMPLANT
GAUZE 4X4 16PLY ~~LOC~~+RFID DBL (SPONGE) ×2 IMPLANT
GLOVE SURG ENC MOIS LTX SZ6.5 (GLOVE) ×3 IMPLANT
GLOVE SURG POLYISO LF SZ6.5 (GLOVE) ×1 IMPLANT
GLOVE SURG UNDER POLY LF SZ7 (GLOVE) ×8 IMPLANT
GOWN STRL REUS W/TWL LRG LVL3 (GOWN DISPOSABLE) ×7 IMPLANT
KIT TURNOVER KIT A (KITS) ×3 IMPLANT
MANIFOLD NEPTUNE II (INSTRUMENTS) ×3 IMPLANT
NDL HYPO 25X1 1.5 SAFETY (NEEDLE) ×2 IMPLANT
NEEDLE HYPO 25X1 1.5 SAFETY (NEEDLE) ×3 IMPLANT
NS IRRIG 1000ML POUR BTL (IV SOLUTION) ×3 IMPLANT
PACK MINOR (CUSTOM PROCEDURE TRAY) ×3 IMPLANT
PAD ARMBOARD 7.5X6 YLW CONV (MISCELLANEOUS) ×3 IMPLANT
PAD DRESSING TELFA 3X8 NADH (GAUZE/BANDAGES/DRESSINGS) IMPLANT
SET BASIN LINEN APH (SET/KITS/TRAYS/PACK) ×3 IMPLANT
SUT ETHILON 3 0 FSL (SUTURE) ×2 IMPLANT
SUT MNCRL AB 4-0 PS2 18 (SUTURE) ×3 IMPLANT
SUT VIC AB 3-0 SH 27 (SUTURE) ×6
SUT VIC AB 3-0 SH 27X BRD (SUTURE) ×2 IMPLANT
SYR BULB IRRIG 60ML STRL (SYRINGE) ×1 IMPLANT
SYR CONTROL 10ML LL (SYRINGE) ×3 IMPLANT

## 2021-09-13 NOTE — Op Note (Signed)
SURGICAL OPERATIVE REPORT ? ?DATE OF PROCEDURE: 09/13/2021 ? ?ATTENDING Surgeon(s): ?Menno Vanbergen, Flint Melter, DO ? ?ANESTHESIA: General  ? ?PRE-OPERATIVE DIAGNOSIS: Increasingly symptomatic (painful) sebaceous cyst of the left shoulder and midline back  ?  ?POST-OPERATIVE DIAGNOSIS: Increasingly symptomatic (painful) sebaceous cyst of the left shoulder and midline back  ?  ?PROCEDURE(S):  ?1.) Excision of symptomatic sebaceous cyst of the left shoulder and midline back  ?  ?INTRAOPERATIVE FINDINGS: 4 cm x 3 cm non-infected subcutaneous mass of the left shoulder (most likely a sebaceous cyst), removed intact; 3 cm x 2.5 cm non-infected subcutaneous mass of the left shoulder (most likely a sebaceous cyst), removed intact ? ?ESTIMATED BLOOD LOSS: Minimal (< 20 mL) ? ?URINE OUTPUT: No Foley ? ?SPECIMENS: 4 cm x 3 cm non-infected subcutaneous mass of the left shoulder (most likely a sebaceous cyst), removed intact; 3 cm x 2.5 cm non-infected subcutaneous mass of the left shoulder (most likely a sebaceous cyst), removed intact ? ?IMPLANTS: None ? ?DRAINS: None ? ?COMPLICATIONS: None apparent ? ?CONDITION AT END OF PROCEDURE: Hemodynamically stable and awake ? ?DISPOSITION OF PATIENT: PACU ? ?INDICATIONS FOR PROCEDURE:  ?Patient is a 66 y.o. male who presented for an increasingly symptomatic (painful) sebaceous cyst of the left shoulder and midline back.  Patient requested that it be removed so activities can be performed with less discomfort, and patient was accordingly referred for surgical evaluation and management. All risks, benefits, and alternatives to above procedure were discussed with the patient, all of patient's questions were answered to his expressed satisfaction, and informed consent was obtained and documented. ?  ?DETAILS OF PROCEDURE: ?Patient was brought to the operating suite and was appropriately identified. Laying on the OR table/bed in a right lateral decubitus position.  General endotracheal  anesthesia was induced. Intravenous antibiotics were administered per protocol. Operative sites was prepped and draped in the usual sterile fashion.  ? ?A elliptical incision was made overlying the mass on the left shoulder. Using a combination of electrocautery sharp dissection, the dermis and subcutaneous tissues dissected around the mass. The mass was removed in its entirety and sent to pathology for evaluation.  Hemostasis was achieved, and the wound was copiously irrigated with sterile warm saline. Deep tissue and dermis were re-approximated using buried interrupted 3-0 Vicryl suture, and vertical mattress 3-0 Nylon suture was used to re-approximate epidermis.  ? ?Attention was then turned to the midline back mass. A elliptical incision was made overlying the mass on the left shoulder. Using a combination of electrocautery sharp dissection, the dermis and subcutaneous tissues dissected around the mass. The mass was removed in its entirety and sent to pathology for evaluation.  Hemostasis was achieved, and the wound was copiously irrigated with sterile warm saline. Deep tissue and dermis were re-approximated using buried interrupted 3-0 Vicryl suture, and vertical mattress 3-0 Nylon suture was used to re-approximate epidermis.  ? ?Skin was then cleaned and dried, and sterile dressing with betadine ointment, telfa, and medipore tape was applied to the incisions. Patient was then safely transferred to recovery for post-procedural monitoring and care. ? ?I was present for all aspects of the above procedure, and no operative complications were apparent. ? ? ?Itali Mckendry, DO ?[facility] ?[place], [place] [address] ?[phone] (office) ?

## 2021-09-13 NOTE — Progress Notes (Signed)
Update Note: ? ?Spoke with the patient and the patient's significant other, Janice Norrie, about the surgery.  It was explained that the masses were able to be removed without difficulty.  They both appeared to be sebaceous cysts, but they have been sent to pathology.  He has external stitches that will be removed at a follow up visit on 3/14.  He can remove his dressings tomorrow, and shower tomorrow.  Keep the incisions clean and dry.  I have discharged him with Roxicodone and colace.  I also recommend that he take scheduled Tylenol for a couple days after the procedure.  All questions were answered to their expressed satisfaction. ? ?Graciella Freer, DO ?[facility] ?[place], [place] [address] ?[phone] (office) ? ?

## 2021-09-13 NOTE — Anesthesia Postprocedure Evaluation (Signed)
Anesthesia Post Note ? ?Patient: ISHMAEL BERKOVICH ? ?Procedure(s) Performed: EXCISION LIPOMA; SHOULDER, 4CM (Left: Shoulder) ?EXCISION MASS; BACK, 3CM (Back) ? ?Patient location during evaluation: Phase II ?Anesthesia Type: General ?Level of consciousness: awake and alert and oriented ?Pain management: pain level controlled ?Vital Signs Assessment: post-procedure vital signs reviewed and stable ?Respiratory status: spontaneous breathing, nonlabored ventilation and respiratory function stable ?Cardiovascular status: blood pressure returned to baseline and stable ?Postop Assessment: no apparent nausea or vomiting ?Anesthetic complications: no ? ? ?No notable events documented. ? ? ?Last Vitals:  ?Vitals:  ? 09/13/21 0930 09/13/21 0943  ?BP: 125/67 132/69  ?Pulse: 81   ?Resp: 19   ?Temp:    ?SpO2: 93%   ?  ?Last Pain:  ?Vitals:  ? 09/13/21 0943  ?TempSrc:   ?PainSc: 0-No pain  ? ? ?  ?  ?  ?  ?  ?  ? ?Jenan Ellegood C Miklo Aken ? ? ? ? ?

## 2021-09-13 NOTE — Interval H&P Note (Signed)
History and Physical Interval Note: ? ?09/13/2021 ?7:30 AM ? ?Duane Adams  has presented today for surgery, with the diagnosis of Cyst; back ?Lipoma, shoulder.  The various methods of treatment have been discussed with the patient and family. After consideration of risks, benefits and other options for treatment, the patient has consented to  Procedure(s): ?EXCISION LIPOMA; SHOULDER, 4CM (Left) ?EXCISION MASS; BACK, 3CM (N/A) as a surgical intervention.  The patient's history has been reviewed, patient examined, no change in status, stable for surgery.  I have reviewed the patient's chart and labs.  Questions were answered to the patient's satisfaction.   ? ? ?Maiya Kates A Areli Jowett ? ? ?

## 2021-09-13 NOTE — Anesthesia Procedure Notes (Signed)
Procedure Name: Intubation ?Date/Time: 09/13/2021 7:45 AM ?Performed by: Minerva Ends, CRNA ?Pre-anesthesia Checklist: Patient identified, Emergency Drugs available, Suction available and Patient being monitored ?Patient Re-evaluated:Patient Re-evaluated prior to induction ?Oxygen Delivery Method: Circle system utilized ?Preoxygenation: Pre-oxygenation with 100% oxygen ?Induction Type: IV induction ?Ventilation: Mask ventilation without difficulty ?Grade View: Grade II ?Tube type: Oral ?Tube size: 7.0 mm ?Number of attempts: 1 ?Airway Equipment and Method: Stylet and Oral airway ?Placement Confirmation: ETT inserted through vocal cords under direct vision, positive ETCO2 and breath sounds checked- equal and bilateral ?Secured at: 23 cm ?Tube secured with: Tape ?Dental Injury: Teeth and Oropharynx as per pre-operative assessment  ? ? ? ? ?

## 2021-09-13 NOTE — Anesthesia Preprocedure Evaluation (Signed)
Anesthesia Evaluation  ?Patient identified by MRN, date of birth, ID band ?Patient awake ? ? ? ?Reviewed: ?Allergy & Precautions, NPO status , Patient's Chart, lab work & pertinent test results, reviewed documented beta blocker date and time  ? ?Airway ?Mallampati: II ? ?TM Distance: >3 FB ?Neck ROM: Full ? ? ? Dental ? ?(+) Dental Advisory Given, Missing, Chipped ?  ?Pulmonary ?Current SmokerPatient did not abstain from smoking.,  ?  ?Pulmonary exam normal ?breath sounds clear to auscultation ? ? ? ? ? ? Cardiovascular ?hypertension, Pt. on home beta blockers and Pt. on medications ?+ angina + CAD, + Cardiac Stents and + Peripheral Vascular Disease  ?Normal cardiovascular exam ?Rhythm:Regular Rate:Normal ? ? ?  ?Neuro/Psych ?Anxiety  Neuromuscular disease CVA, No Residual Symptoms negative psych ROS  ? GI/Hepatic ?hiatal hernia, GERD  Medicated and Controlled,(+) Cirrhosis  ?  ? substance abuse ? alcohol use, Hepatitis -, C  ?Endo/Other  ?diabetes, Well Controlled, Type 2, Oral Hypoglycemic Agents ? Renal/GU ?negative Renal ROS  ?negative genitourinary ?  ?Musculoskeletal ? ?(+) Arthritis , Osteoarthritis,   ? Abdominal ?  ?Peds ?negative pediatric ROS ?(+)  Hematology ?negative hematology ROS ?(+)   ?Anesthesia Other Findings ? ? Reproductive/Obstetrics ?negative OB ROS ? ?  ? ? ? ? ? ? ? ? ? ? ? ? ? ?  ?  ? ? ? ? ? ? ?Anesthesia Physical ?Anesthesia Plan ? ?ASA: 3 ? ?Anesthesia Plan: General  ? ?Post-op Pain Management: Dilaudid IV  ? ?Induction: Intravenous ? ?PONV Risk Score and Plan: 2 and Ondansetron and Dexamethasone ? ?Airway Management Planned: LMA and Oral ETT ? ?Additional Equipment:  ? ?Intra-op Plan:  ? ?Post-operative Plan: Extubation in OR ? ?Informed Consent: I have reviewed the patients History and Physical, chart, labs and discussed the procedure including the risks, benefits and alternatives for the proposed anesthesia with the patient or authorized  representative who has indicated his/her understanding and acceptance.  ? ? ? ?Dental advisory given ? ?Plan Discussed with: CRNA and Surgeon ? ?Anesthesia Plan Comments:   ? ? ? ? ? ? ?Anesthesia Quick Evaluation ? ?

## 2021-09-13 NOTE — Transfer of Care (Signed)
Immediate Anesthesia Transfer of Care Note ? ?Patient: Duane Adams ? ?Procedure(s) Performed: EXCISION LIPOMA; SHOULDER, 4CM (Left: Shoulder) ?EXCISION MASS; BACK, 3CM (Back) ? ?Patient Location: PACU ? ?Anesthesia Type:General ? ?Level of Consciousness: awake, alert  and oriented ? ?Airway & Oxygen Therapy: Patient Spontanous Breathing ? ?Post-op Assessment: Report given to RN and Post -op Vital signs reviewed and stable ? ?Post vital signs: Reviewed and stable ? ?Last Vitals:  ?Vitals Value Taken Time  ?BP    ?Temp    ?Pulse    ?Resp    ?SpO2    ? ? ?Last Pain:  ?Vitals:  ? 09/13/21 0653  ?TempSrc: Oral  ?PainSc: 0-No pain  ?   ? ?Patients Stated Pain Goal: 7 (09/13/21 8682) ? ?Complications: No notable events documented. ?

## 2021-09-13 NOTE — Discharge Instructions (Signed)
Ambulatory Surgery Discharge Instructions ? ?General Anesthesia or Sedation ?Do not drive or operate heavy machinery for 24 hours.  ?Do not consume alcohol, tranquilizers, sleeping medications, or any non-prescribed medications for 24 hours. ?Do not make important decisions or sign any important papers in the next 24 hours. ?You should have someone with you tonight at home. ? ?Activity ? You are advised to go directly home from the hospital.  Restrict your activities and rest for a day.  Resume light activity tomorrow. No heavy lifting over 10 lbs or strenuous exercise. ? ?Fluids and Diet ?Begin with clear liquids, bouillon, dry toast, soda crackers.  If not nauseated, you may go to a regular diet when you desire.  Greasy and spicy foods are not advised. ? ?Medications ? If you have not had a bowel movement in 24 hours, take 2 tablespoons over the counter Milk of mag. ?            You May resume your blood thinners tomorrow (Aspirin, coumadin, or other).  ?You are being discharged with prescriptions for Opioid/Narcotic Medications: There are some specific considerations for these medications that you should know. ?Opioid Meds have risks & benefits. Addiction to these meds is always a concern with prolonged use ?Take medication only as directed ?Do not drive while taking narcotic pain medication ?Do not crush tablets or capsules ?Do not use a different container than medication was dispensed in ?Lock the container of medication in a cool, dry place out of reach of children and pets. ?Opioid medication can cause addiction ?Do not share with anyone else (this is a felony) ?Do not store medications for future use. Dispose of them properly. ?    Disposal:  ?Find a [employer] household drug take back site near you.  ?If you can't get to a drug take back site, use the recipe below as a last resort to dispose of expired, unused or unwanted drugs. ?Disposal  ?(Do not dispose chemotherapy drugs this way, talk to your  prescribing doctor instead.) Step 1: Mix drugs (do not crush) with dirt, kitty litter, or used coffee grounds and add a small amount of water to dissolve any solid medications. Step 2: Seal drugs in plastic bag. Step 3: Place plastic bag in trash. Step 4: Take prescription container and scratch out personal information, then recycle or throw away. ? ?Operative Site ? Ok to take outer dressing off tomorrow. You have sutures underneath. Ok to Games developer. Keep wound clean and dry. No baths or swimming. No lifting more than 10 pounds. Your sutures will be removed at your follow up visit. ? ?Contact Information: ?If you have questions or concerns, please call our office, [phone], Monday- Thursday 8AM-5PM and Friday 8AM-12Noon.  ?If it is after hours or on the weekend, please call [facility], [phone], and ask to speak to the surgeon on call for Dr. Okey Dupre at [facility].  ? ?SPECIFIC COMPLICATIONS TO WATCH FOR: ?Inability to urinate ?Fever over 101? F by mouth ?Nausea and vomiting lasting longer than 24 hours. ?Pain not relieved by medication ordered ?Swelling around the operative site ?Increased redness, warmth, hardness, around operative area ?Numbness, tingling, or cold fingers or toes ?Blood -soaked dressing, (small amounts of oozing may be normal) ?Increasing and progressive drainage from surgical area or exam site ? ? ?

## 2021-09-16 LAB — SURGICAL PATHOLOGY

## 2021-09-17 ENCOUNTER — Encounter (HOSPITAL_COMMUNITY): Payer: Self-pay | Admitting: Surgery

## 2021-09-24 ENCOUNTER — Ambulatory Visit (INDEPENDENT_AMBULATORY_CARE_PROVIDER_SITE_OTHER): Payer: Medicaid Other | Admitting: Surgery

## 2021-09-24 ENCOUNTER — Encounter: Payer: Self-pay | Admitting: Surgery

## 2021-09-24 ENCOUNTER — Other Ambulatory Visit: Payer: Self-pay

## 2021-09-24 VITALS — BP 187/73 | HR 88 | Temp 97.4°F | Resp 14 | Ht 69.0 in | Wt 190.0 lb

## 2021-09-24 DIAGNOSIS — Z09 Encounter for follow-up examination after completed treatment for conditions other than malignant neoplasm: Secondary | ICD-10-CM

## 2021-09-24 NOTE — Patient Instructions (Signed)
-  Don't sleep on your left side ?-Apply antibiotic ointment to your left shoulder incision site ?

## 2021-09-24 NOTE — Progress Notes (Signed)
[facility] Surgical Clinic Note  ? ?HPI:  ?66 y.o. Male presents to clinic for post-op follow-up status post left posterior shoulder and midline back mass excisions on 3/3. Patient reports that he has been doing well since the surgery and denies any pain associated with his incision sites.  He denies any drainage from his incision sites.  He also denies fevers and chills.  He is tolerating a diet without nausea and vomiting. ? ?Review of Systems:  ?All other review of systems: otherwise negative  ? ?Vital Signs:  ?BP (!) 187/73   Pulse 88   Temp (!) 97.4 ?F (36.3 ?C) (Other (Comment))   Resp 14   Ht '5\' 9"'$  (1.753 m)   Wt 190 lb (86.2 kg)   SpO2 97%   BMI 28.06 kg/m?   ? ?Physical Exam:  ?Physical Exam ?Vitals reviewed.  ?Constitutional:   ?   Appearance: Normal appearance.  ?Skin: ?   General: Skin is warm and dry.  ?   Comments: Midline back incision site healing well with nylon sutures in place, minimal erythema associated with suture sites; left posterior shoulder incision site with a small amount of serous drainage, erythema associated with suture sites, no purulent drainage  ?Neurological:  ?   Mental Status: He is alert.  ? ? ?Laboratory studies: None ? ?Imaging: None ? ?Pathology: ?A. SEBACEOUS CYST, LEFT SHOULDER, EXCISION:  ?- Epidermoid cyst.  No dysplasia or malignancy.  ? ?B. SEBACEOUS CYST, MIDLINE BACK, EXCISION:  ?- Epidermoid cyst.  No dysplasia or malignancy.  ? ?Assessment:  ?66 y.o. yo Male who s/p excision of back mass x2 (left posterior shoulder and midline back mass) on 3/3. ? ?Plan:  ?-Patient doing well since the surgery and denies any issues related to his incision sites ?-Sutures removed from his midline back incision site, as this area was well-healed.  Left sutures in his left posterior shoulder incision site, as the area does not appear to have fully healed and is leaking serous drainage.  Patient states that he sleeps on his left side and has been putting pressure directly on  the incision. ?-Advised him to not sleep on his left side and to apply antibiotic ointment to this incision site ?-Follow-up appointment scheduled for me on 3/28 to evaluate for suture removal on his left posterior shoulder incision site ?-I did explain that there is a chance that he might develop an open wound at this area if it does not heal up over the next week ? ?All of the above recommendations were discussed with the patient, and all of patient's questions were answered to his expressed satisfaction. ? ?Graciella Freer, DO ?[facility] ?[place], [place] [address] ?[phone] (office) ? ? ?

## 2021-10-04 ENCOUNTER — Other Ambulatory Visit: Payer: Self-pay

## 2021-10-04 ENCOUNTER — Ambulatory Visit (HOSPITAL_COMMUNITY)
Admission: RE | Admit: 2021-10-04 | Discharge: 2021-10-04 | Disposition: A | Discharge status: Home / Self Care | Payer: Medicaid Other | Source: Ambulatory Visit | Attending: Neurological Surgery | Admitting: Neurological Surgery

## 2021-10-04 DIAGNOSIS — M48062 Spinal stenosis, lumbar region with neurogenic claudication: Secondary | ICD-10-CM | POA: Diagnosis not present

## 2021-10-08 ENCOUNTER — Other Ambulatory Visit: Payer: Self-pay

## 2021-10-08 ENCOUNTER — Ambulatory Visit (INDEPENDENT_AMBULATORY_CARE_PROVIDER_SITE_OTHER): Payer: Medicaid Other | Admitting: Surgery

## 2021-10-08 ENCOUNTER — Encounter: Payer: Self-pay | Admitting: Surgery

## 2021-10-08 VITALS — BP 155/80 | HR 88 | Temp 97.8°F | Resp 14 | Ht 69.0 in | Wt 188.0 lb

## 2021-10-08 DIAGNOSIS — Z09 Encounter for follow-up examination after completed treatment for conditions other than malignant neoplasm: Secondary | ICD-10-CM

## 2021-10-08 NOTE — Progress Notes (Signed)
[facility] Surgical Clinic Note  ? ?HPI:  ?66 y.o. Male presents to clinic for post-op follow-up s/p left posterior shoulder and midline back mass excisions on 3/3. Patient reports that he has been doing well.  He only had a small amount of drainage from the shoulder incision site for a couple of days after he was seen in clinic.  He denies any pain associated with his incisions.  He denies fevers and chills.  He has no other complaints at this time. ? ?Review of Systems:  ?All other review of systems: otherwise negative  ? ?Vital Signs:  ?There were no vitals taken for this visit.  ? ?Physical Exam:  ?Physical Exam ?Vitals reviewed.  ?Constitutional:   ?   Appearance: Normal appearance.  ?Skin: ?   General: Skin is warm and dry.  ?   Comments: Left shoulder incision with sutures in place, well-healed; midline back incision healing well without signs of infection  ?Neurological:  ?   Mental Status: He is alert.  ? ? ?Laboratory studies: None ? ?Imaging:  ?None ? ?Assessment:  ?66 y.o. yo Male who was s/p excision of back mass x2 (left posterior shoulder and midline back mass) on 3/3 ? ?Plan:  ?-Patient has been doing very well and denies any pain associated with his incision sites ?-Sutures removed from left posterior shoulder incision site- well-healed; all sutures have been removed ?- Follow up with me as needed if he has recurrence of either cyst or if he develops a new cyst in a different location ? ?All of the above recommendations were discussed with the patient, and all of patient's questions were answered to his expressed satisfaction. ? ?Graciella Freer, DO ?[facility] ?[place], [place] [address] ?[phone] (office) ?

## 2021-10-11 DIAGNOSIS — M25559 Pain in unspecified hip: Secondary | ICD-10-CM | POA: Diagnosis not present

## 2021-10-11 DIAGNOSIS — Z6827 Body mass index (BMI) 27.0-27.9, adult: Secondary | ICD-10-CM | POA: Diagnosis not present

## 2021-10-11 DIAGNOSIS — I10 Essential (primary) hypertension: Secondary | ICD-10-CM | POA: Diagnosis not present

## 2021-10-15 ENCOUNTER — Other Ambulatory Visit: Payer: Self-pay | Admitting: Gastroenterology

## 2021-10-15 DIAGNOSIS — K219 Gastro-esophageal reflux disease without esophagitis: Secondary | ICD-10-CM

## 2021-10-19 NOTE — Progress Notes (Signed)
? ? ?Referring Provider: No ref. provider found ?Primary Care Physician:  Lindell Spar, MD ?Primary GI Physician: Dr. Abbey Chatters ? ?Chief Complaint  ?Patient presents with  ? Follow-up  ? ? ?HPI:   ?Duane Adams is a 66 y.o. 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  Last colonoscopy September 2022 following an episode of rectal bleeding revealing nonbleeding internal hemorrhoids, diverticulosis in sigmoid colon, tattoo in transverse colon with tattoo site appearing normal.  Due for repeat colonoscopy in 2027. ? ?He is presenting today for follow-up. ? ?Last seen in our office 04/18/2021.  Reported minimal toilet tissue hematochezia maybe once a week and was not bothered by this.  Did not want to pursue hemorrhoid banding.  GERD was well controlled on Protonix 40 mg daily, but he was interested in trying to stop this.  Regarding history of liver fibrosis, he was due for RUQ ultrasound.  Admitted to drinking 5 or 6 beers every couple of weeks when mowing for pain relief.  Planned to decrease Protonix to 20 mg daily x8 weeks, and if still doing well, may try to taper off completely.  He was also scheduled for RUQ ultrasound.  Follow-up in 6 months. ? ?Ultrasound 04/24/2021 was stable.  Evidence of increased echogenicity of the liver which could be secondary to underlying fibrosis and could not rule out cirrhosis, but no mention of nodularity.  Recommended repeat ultrasound in 6 months with elastography. ? ?Today: ? ?GERD: ?Well controlled on Protonix 20 mg daily.  Denies nausea, vomiting, dysphagia, abdominal pain, melena.  Would like to taper off PPI completely.  ? ?Liver fibrosis: ?LFTs within normal limits February 2023.  Platelets also normal.  ?Decreased  alcohol use. Drinking 2-3 beer a couple times a year. ?Denies abdominal distention, lower extremity edema, yellowing of the eyes or skin, changes in mental status/confusion, bruising. ? ? ?Intermittent rectal bleeding has essentially resolved since he decreased his alcohol use. Bowels moving well.  ? ?Past Medical History:  ?Diagnosis Date  ? Allergy   ? Anxiety   ? Cirrhosis ([place])   ? 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  ? Concussion several yrs ago  ? no residual  ? Coronary artery disease   ? a. 03/2013 Cath/PCI: LM nl, LAD 3m(2.5x20 Promus Premier DES), LCX min irregs, RCA dom, 90p(3.0x20 Promus Premier DES), EF 55-65%.  ? [place]  ? slight memory issues  ? Essential hypertension, benign   ? GERD (gastroesophageal reflux disease)   ? Helicobacter pylori gastritis JUN 2016 EGD/Bx  ? PREVPAK BID FOR 14 DAYS  ? Hepatitis   ? Hepatitis C treated  2011  ? Hiatal hernia   ? History of kidney stones   ? Mixed hyperlipidemia   ? Neuroma   ? Prediabetes 06/23/2019  ? Skin cancer   ? [place]   ? 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 Tobacco abuse   ? ? ?Past Surgical History:  ?Procedure Laterality Date  ? ABDOMINAL AORTOGRAM W/LOWER EXTREMITY N/A 06/15/2017  ? Procedure: ABDOMINAL AORTOGRAM W/LOWER EXTREMITY;  Surgeon: DAngelia Mould MD;  [facility];  Service: Cardiovascular;  Laterality: N/A;  Bilateral  ? BIOPSY N/A 12/19/2014  ? Procedure: BIOPSY;  Surgeon:  Danie Binder, MD;  [facility]: [facility];  Service: Endoscopy;  Laterality: N/A;  ? COLONOSCOPY  12/2014  ? [facility]: with endoscopic mucosal resection. 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 Needs colonoscopy in 1 year.   ? COLONOSCOPY WITH PROPOFOL N/A 12/19/2014  ? Dr. Oneida Alar: six simple adenomas and 3 hyperplastic polyps. Had flat mid-transverse colon polyp and referred to [facility] for endoscopic mucosal resection, which is scheduled for July 22  ? COLONOSCOPY  WITH PROPOFOL N/A 04/01/2021  ? Surgeon: Eloise Harman, DO;Nonbleeding internal hemorrhoids, diverticulosis in the sigmoid colon, tattoo in transverse colon with tattoo site appearing normal.  Recommended repeat colonoscopy in 5 years.  ? CORONARY ANGIOPLASTY WITH STENT PLACEMENT  04/11/2013  ? LAD &  RCA     DR COOPER  ? ESOPHAGOGASTRODUODENOSCOPY (EGD) WITH PROPOFOL N/A 12/19/2014  ? Dr. Oneida Alar:  without varices, small hiatal hernia noted, moderate non-erosive gastritis and mild duodenitis.  POSITIVE H.PYLORI. Prescribed Prevpac.  ? EXCISION MORTON'S NEUROMA Left 08/15/2020  ? Procedure: EXCISION MORTON'S NEUROMA THIRD INTERSPACE LEFT FOOT;  Surgeon: [name], DPM;  Location: [place];  Service: Podiatry;  Laterality: Left;  ? EXTRACORPOREAL SHOCK WAVE LITHOTRIPSY  2001  ? FLEXIBLE SIGMOIDOSCOPY N/A 12/23/2017  ? Procedure: FLEXIBLE SIGMOIDOSCOPY;  Surgeon: Danie Binder, MD;  [facility];  Service: Endoscopy;  Laterality: N/A;  1:45pm  ? HAND RECONSTRUCTION Left   ? HEMORRHOID BANDING N/A 12/23/2017  ? Procedure: HEMORRHOID BANDING;  Surgeon: Danie Binder, MD;  [facility];  Service: Endoscopy;  Laterality: N/A;  ? LEFT HEART CATH AND CORONARY ANGIOGRAPHY N/A 01/22/2021  ? Procedure: LEFT HEART CATH AND CORONARY ANGIOGRAPHY;  Surgeon: Martinique, Peter M, MD;  Location: [place] [facility];  Service: Cardiovascular;  Laterality: N/A;  ? LEFT HEART CATHETERIZATION WITH CORONARY ANGIOGRAM N/A 04/11/2013  ? Procedure: LEFT HEART CATHETERIZATION WITH CORONARY ANGIOGRAM;  Surgeon: Blane Ohara, MD;  Location: [facility] CATH LAB;  Service: Cardiovascular;  Laterality: N/A;  ? LEG TENDON SURGERY Right   ? LIPOMA EXCISION Left 09/13/2021  ? Procedure: EXCISION LIPOMA; SHOULDER, 4CM;  Surgeon: Rusty Aus, DO;  Location: [facility];  Service: General;  Laterality: Left;  ? MASS EXCISION N/A 09/13/2021  ? Procedure: EXCISION MASS; BACK, 3CM;  Surgeon: Rusty Aus, DO;  Location: [facility];  Service: General;  Laterality: N/A;  ? POLYPECTOMY N/A 12/19/2014  ? Procedure: POLYPECTOMY;  Surgeon: Danie Binder, MD;  Location: [facility];  Service: Endoscopy;  Laterality: N/A;  ? ? ?Current Outpatient Medications  ?Medication Sig Dispense Refill  ? albuterol (VENTOLIN HFA) 108 (90 Base) MCG/ACT inhaler Inhale 2 puffs into the lungs every 6 (six) hours as needed for wheezing or shortness of breath. 8 g 5  ? amLODipine (NORVASC) 10 MG tablet Take 1 tablet (10 mg total) by mouth daily. 90 tablet 3  ? aspirin EC 81 MG tablet Take 81 mg by mouth at bedtime.     ? atorvastatin (LIPITOR) 80 MG tablet Take 1 tablet (80 mg total) by mouth daily. 90 tablet 3  ? chlorthalidone (HYGROTON) 25 MG tablet Take 1 tablet (25 mg total) by mouth daily. 90 tablet 3  ? Cholecalciferol (VITAMIN D3) 2000 units TABS Take 4,000 Units by mouth 2 (two) times daily.    ? cloNIDine (CATAPRES) 0.1 MG tablet TAKE (1) TABLET BY MOUTH TWICE DAILY. 180 tablet 1  ? isosorbide mononitrate (IMDUR) 30 MG 24 hr tablet  Take 1 tablet (30 mg total) by mouth daily. 90 tablet 3  ? metFORMIN (GLUCOPHAGE XR) 500 MG 24 hr tablet Take 1 tablet (500 mg total) by mouth daily with breakfast. 90 tablet 0  ? metoprolol succinate (TOPROL-XL) 100 MG 24 hr tablet Take 1 tablet (100 mg total) by mouth daily. Take in addition to '50mg'$  daily for a total of '150mg'$  daily. (Patient taking differently: Take 100 mg by mouth in the morning.) 90 tablet 3  ? Multiple Vitamin (MULTIVITAMIN WITH MINERALS) TABS tablet Take 1 tablet by mouth daily.    ? nitroGLYCERIN (NITROSTAT) 0.4 MG SL tablet Place 1 tablet (0.4 mg total) under the tongue every 5 (five) minutes as needed for chest pain. 25 tablet 3  ? Omega 3 1200 MG CAPS Take 2,400 mg by mouth daily.    ? pantoprazole (PROTONIX) 20 MG tablet TAKE ONE TABLET BY MOUTH ONCE DAILY. 90 tablet 0  ? potassium chloride SA (KLOR-CON) 20 MEQ tablet TAKE ONE TABLET BY MOUTH ONCE DAILY. 90 tablet 3  ?  budesonide-formoterol (SYMBICORT) 160-4.5 MCG/ACT inhaler Inhale 2 puffs into the lungs 2 (two) times daily. (Patient not taking: Reported on 10/21/2021) 1 each 6  ? metoprolol succinate (TOPROL-XL) 50 MG 24 hr tabl

## 2021-10-21 ENCOUNTER — Ambulatory Visit (INDEPENDENT_AMBULATORY_CARE_PROVIDER_SITE_OTHER): Payer: Medicaid Other | Admitting: Gastroenterology

## 2021-10-21 ENCOUNTER — Encounter: Payer: Self-pay | Admitting: Gastroenterology

## 2021-10-21 VITALS — BP 130/69 | HR 78 | Temp 97.8°F | Ht 69.0 in | Wt 190.4 lb

## 2021-10-21 DIAGNOSIS — K625 Hemorrhage of anus and rectum: Secondary | ICD-10-CM | POA: Diagnosis not present

## 2021-10-21 DIAGNOSIS — K219 Gastro-esophageal reflux disease without esophagitis: Secondary | ICD-10-CM

## 2021-10-21 DIAGNOSIS — K7400 Hepatic fibrosis, unspecified: Secondary | ICD-10-CM

## 2021-10-21 NOTE — Patient Instructions (Signed)
We will arrange to have an ultrasound of your liver in the near future at [facility]. ? ?You may try tapering off of Protonix completely and monitor for recurrent reflux symptoms.  Recommend you take Protonix every other day for a couple of weeks.  If you continues to do well, decrease to every 3 days for couple of weeks.  If you continues to do well, you may discontinue completely. ? ?If you have recurrent regurgitation symptoms, please resume Protonix daily. ? ?It was good to see you again today!  I am glad you are doing well overall! ? ?We will plan to see you back in about 6 months.  Do not hesitate to call if you have any questions or concerns prior to your next visit. ? ?Aliene Altes, PA-C ?[facility] ? ?

## 2021-10-25 ENCOUNTER — Other Ambulatory Visit: Payer: Self-pay | Admitting: Cardiology

## 2021-11-01 ENCOUNTER — Ambulatory Visit (HOSPITAL_COMMUNITY)
Admission: RE | Admit: 2021-11-01 | Discharge: 2021-11-01 | Disposition: A | Discharge status: Home / Self Care | Payer: Medicaid Other | Source: Ambulatory Visit | Attending: Gastroenterology | Admitting: Gastroenterology

## 2021-11-01 DIAGNOSIS — K7400 Hepatic fibrosis, unspecified: Secondary | ICD-10-CM | POA: Diagnosis not present

## 2021-11-04 DIAGNOSIS — M25551 Pain in right hip: Secondary | ICD-10-CM | POA: Diagnosis not present

## 2021-11-04 DIAGNOSIS — M25552 Pain in left hip: Secondary | ICD-10-CM | POA: Diagnosis not present

## 2021-11-11 ENCOUNTER — Other Ambulatory Visit: Payer: Self-pay | Admitting: Student

## 2021-11-11 ENCOUNTER — Other Ambulatory Visit: Payer: Self-pay | Admitting: Cardiology

## 2021-11-11 ENCOUNTER — Other Ambulatory Visit: Payer: Self-pay | Admitting: Internal Medicine

## 2021-11-11 DIAGNOSIS — E1169 Type 2 diabetes mellitus with other specified complication: Secondary | ICD-10-CM

## 2021-11-12 DIAGNOSIS — M5416 Radiculopathy, lumbar region: Secondary | ICD-10-CM | POA: Diagnosis not present

## 2021-12-23 DIAGNOSIS — M5416 Radiculopathy, lumbar region: Secondary | ICD-10-CM | POA: Diagnosis not present

## 2022-01-09 DIAGNOSIS — M5416 Radiculopathy, lumbar region: Secondary | ICD-10-CM | POA: Diagnosis not present

## 2022-02-11 ENCOUNTER — Other Ambulatory Visit: Payer: Self-pay | Admitting: Cardiology

## 2022-02-11 ENCOUNTER — Telehealth: Payer: Self-pay

## 2022-02-11 ENCOUNTER — Encounter: Payer: Self-pay | Admitting: Internal Medicine

## 2022-02-11 ENCOUNTER — Ambulatory Visit (INDEPENDENT_AMBULATORY_CARE_PROVIDER_SITE_OTHER): Payer: Medicare Other | Admitting: Internal Medicine

## 2022-02-11 VITALS — BP 134/70 | HR 83 | Resp 18 | Ht 68.0 in | Wt 185.6 lb

## 2022-02-11 DIAGNOSIS — E782 Mixed hyperlipidemia: Secondary | ICD-10-CM | POA: Diagnosis not present

## 2022-02-11 DIAGNOSIS — B351 Tinea unguium: Secondary | ICD-10-CM | POA: Insufficient documentation

## 2022-02-11 DIAGNOSIS — J410 Simple chronic bronchitis: Secondary | ICD-10-CM | POA: Diagnosis not present

## 2022-02-11 DIAGNOSIS — I25118 Atherosclerotic heart disease of native coronary artery with other forms of angina pectoris: Secondary | ICD-10-CM

## 2022-02-11 DIAGNOSIS — I5032 Chronic diastolic (congestive) heart failure: Secondary | ICD-10-CM | POA: Insufficient documentation

## 2022-02-11 DIAGNOSIS — I10 Essential (primary) hypertension: Secondary | ICD-10-CM | POA: Diagnosis not present

## 2022-02-11 DIAGNOSIS — E1169 Type 2 diabetes mellitus with other specified complication: Secondary | ICD-10-CM

## 2022-02-11 MED ORDER — EMPAGLIFLOZIN 10 MG PO TABS: 10.0000 mg | ORAL_TABLET | Freq: Every day | ORAL | 5 refills | Status: DC

## 2022-02-11 NOTE — Progress Notes (Signed)
Established Patient Office Visit  Subjective:  Patient ID: Duane Adams, male    DOB: 06-28-1956  Age: 66 y.o. MRN: [id number]  CC:  Chief Complaint  Patient presents with   Diabetes    pt thinks metformin is causing him to have pass out spells but not sure of what his blood sugar is when he has these spells   Coronary Artery Disease    HPI Duane Adams is a 66 y.o. male with past medical history of HTN, CAD s/p stent placement, PAD, type 2 DM, HLD, COPD, GERD, liver fibrosis, chronic low back pain and tobacco abuse who presents for f/u of his chronic medical conditions.  Type II DM: His HbA1C was 6.5.  He was started on metformin.  He reports diarrhea and abdominal pain with it.  He also reports to episodes of near syncope.  He had a beer after an episode, which improved his symptoms.  He denied any nausea, vomiting, chest pain, dyspnea, palpitations, numbness or tingling of the UE or LE.  Denies any polyphagia, but does report nocturia.  CAD, PAD and HTN: BP is well-controlled. Takes medications regularly. Patient denies headache, dizziness, chest pain, or palpitations.  He follows up with cardiology for history of CAD.  He also sees vascular surgery for history of subclavian vein stenosis.  He complains of dyspnea on exertion and severe fatigue, for which she has seen Dr. Halford Chessman.  He was given Symbicort, but has been using it as needed instead of regularly.  Denies any fever, chills, hemoptysis, night sweats or LAD.  He complains of chronic hip pain, for which she has seen orthopedic surgeon - Dr. Aline Brochure.  He was told that he does not have hip arthritis according to x-ray of the hip, but his hip pain is due to lumbar spinal stenosis.  He has seen spine surgery yet and has steroid injections without much relief.  He has completed PT with no relief of his hip pain.  He denies any numbness or tingling of the LE currently.     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   Concussion several yrs ago   no residual   Coronary artery disease    a. 03/2013 Cath/PCI: LM nl, LAD 54m(2.5x20 Promus Premier DES), LCX min irregs, RCA dom, 90p(3.0x20 Promus Premier DES), EF 55-65%.   CV[place]cere[place] 01/[place]   slight memory issues   Essential hypertension, benign    GERD (gastroesophageal reflux disease)    Helicobacter pylori gastritis JUN 2016 EGD/Bx   PREVPAK BID FOR 14 DAYS   Hepatitis    Hepatitis C treated  2011   Hiatal hernia    History of kidney stones    Mixed hyperlipidemia    Neuroma    Prediabetes 06/23/2019   Skin cancer    Substance abuse ([facility]    alcoholic quit 22130  Tobacco abuse     Past Surgical History:  Procedure Laterality Date   ABDOMINAL AORTOGRAM W/LOWER EXTREMITY N/A 06/15/2017   Procedure: ABDOMINAL AORTOGRAM W/LOWER EXTREMITY;  Surgeon: DAngelia Mould MD;  [facility];  Service: Cardiovascular;  Laterality: N/A;  Bilateral   BIOPSY N/A 12/19/2014   Procedure: BIOPSY;  Surgeon: SDanie Binder MD;  Location: [facility];  Service: Endoscopy;  Laterality: N/A;   COLONOSCOPY  12/2014  [facility]: with endoscopic mucosal resection. 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 Needs colonoscopy in 1 year.    COLONOSCOPY WITH PROPOFOL N/A 12/19/2014   Dr. Oneida Alar: six simple adenomas and 3 hyperplastic polyps. Had flat mid-transverse colon polyp and referred to [facility] for endoscopic mucosal resection, which is scheduled for July 22   COLONOSCOPY WITH PROPOFOL N/A 04/01/2021   Surgeon: Eloise Harman, DO;Nonbleeding internal hemorrhoids, diverticulosis in the sigmoid colon, tattoo in transverse colon with tattoo site appearing normal.  Recommended repeat colonoscopy in 5 years.   CORONARY ANGIOPLASTY WITH STENT PLACEMENT  04/11/2013   LAD &  RCA     DR COOPER   ESOPHAGOGASTRODUODENOSCOPY (EGD)  WITH PROPOFOL N/A 12/19/2014   Dr. Oneida Alar:  without varices, small hiatal hernia noted, moderate non-erosive gastritis and mild duodenitis.  POSITIVE H.PYLORI. Prescribed Prevpac.   EXCISION MORTON'S NEUROMA Left 08/15/2020   Procedure: EXCISION MORTON'S NEUROMA THIRD INTERSPACE LEFT FOOT;  Surgeon: [name], DPM;  Location: [place];  Service: Podiatry;  Laterality: Left;   EXTRACORPOREAL SHOCK WAVE LITHOTRIPSY  2001   FLEXIBLE SIGMOIDOSCOPY N/A 12/23/2017   Procedure: FLEXIBLE SIGMOIDOSCOPY;  Surgeon: Danie Binder, MD;  [facility];  Service: Endoscopy;  Laterality: N/A;  1:45pm   HAND RECONSTRUCTION Left    HEMORRHOID BANDING N/A 12/23/2017   Procedure: HEMORRHOID BANDING;  Surgeon: Danie Binder, MD;  [facility];  Service: Endoscopy;  Laterality: N/A;   LEFT HEART CATH AND CORONARY ANGIOGRAPHY N/A 01/22/2021   Procedure: LEFT HEART CATH AND CORONARY ANGIOGRAPHY;  Surgeon: Martinique, Peter M, MD;  Location: [facility] [place] [facility];  Service: Cardiovascular;  Laterality: N/A;   LEFT HEART CATHETERIZATION WITH CORONARY ANGIOGRAM N/A 04/11/2013   Procedure: LEFT HEART CATHETERIZATION WITH CORONARY ANGIOGRAM;  Surgeon: Blane Ohara, MD;  Location[facility];  Service: Cardiovascular;  Laterality: N/A;   LEG TENDON SURGERY Right    LIPOMA EXCISION Left 09/13/2021   Procedure: EXCISION LIPOMA; SHOULDER, 4CM;  Surgeon: Rusty Aus, DO;  Location: [facility];  Service: General;  Laterality: Left;   MASS EXCISION N/A 09/13/2021   Procedure: EXCISION MASS; BACK, 3CM;  Surgeon: Rusty Aus, DO;  [facility]: [facility];  Service: General;  Laterality: N/A;   POLYPECTOMY N/A 12/19/2014   Procedure: POLYPECTOMY;  Surgeon: Danie Binder, MD;  Location: [facility];  Service: Endoscopy;  Laterality: N/A;    Family History  Problem Relation Age of Onset   Stroke Mother    Aneurysm Mother    Hypertension Mother    Heart disease Father     Hypertension Father    Heart disease Brother    Hypertension Brother    Alcohol abuse Brother    Diabetes Paternal Aunt    Colon cancer Neg Hx     Social History   Socioeconomic History   Marital status: Soil scientist    Spouse name: Tisha   Number of children: 2   Years of education: 61   Highest education level: Not on file  Occupational History   Occupation: disabled    Comment: heart  Tobacco Use   Smoking status: Light Smoker    Packs/day: 0.75    Years: 50.00    Total pack years: 37.50    Types: Cigarettes    Start date: 07/14/1968   Smokeless tobacco: Never   Tobacco comments:    5-6 Cigarettes per day  Vaping Use   Vaping Use: Never used  Substance and Sexual Activity   Alcohol use: Yes    Comment: 2-3 beer a couple times a year (10/21/21) Previously 5-6 beer every 2 weeks.   Drug use: Not Currently    Types: Cocaine, Marijuana    Comment: cocaine marijuana and acid last used 1984   Sexual activity: Not Currently  Other Topics Concern   Not on file  Social History Narrative   Disabled from heart disease   Previously worked in Scientist, research (medical) with Neurosurgeon for 28 years   Leisure: Teaching laboratory technician   Social Determinants of Radio broadcast assistant Strain: Not on file  Food Insecurity: Not on file  Transportation Needs: Not on file  Physical Activity: Not on file  Stress: Not on file  Social Connections: Not on file  Intimate Partner Violence: Not on file    Outpatient Medications Prior to Visit  Medication Sig Dispense Refill   albuterol (VENTOLIN HFA) 108 (90 Base) MCG/ACT inhaler Inhale 2 puffs into the lungs every 6 (six) hours as needed for wheezing or shortness of breath. 8 g 5   amLODipine (NORVASC) 10 MG tablet Take 1 tablet (10 mg total) by mouth daily. 90 tablet 3   aspirin EC 81 MG tablet Take 81 mg by mouth at bedtime.      atorvastatin (LIPITOR) 80 MG tablet TAKE ONE TABLET BY MOUTH ONCE DAILY. 30 tablet 0   budesonide-formoterol (SYMBICORT)  160-4.5 MCG/ACT inhaler Inhale 2 puffs into the lungs 2 (two) times daily. 1 each 6   chlorthalidone (HYGROTON) 25 MG tablet TAKE 1 TABLET BY MOUTH DAILY. 90 tablet 1   Cholecalciferol (VITAMIN D3) 2000 units TABS Take 4,000 Units by mouth 2 (two) times daily.     cloNIDine (CATAPRES) 0.1 MG tablet TAKE (1) TABLET BY MOUTH TWICE DAILY. 60 tablet 0   isosorbide mononitrate (IMDUR) 30 MG 24 hr tablet Take 1 tablet (30 mg total) by mouth daily. 90 tablet 3   metoprolol succinate (TOPROL-XL) 100 MG 24 hr tablet Take 1 tablet (100 mg total) by mouth daily. Take in addition to 33m daily for a total of 1567mdaily. (Patient taking differently: Take 100 mg by mouth in the morning.) 90 tablet 3   Multiple Vitamin (MULTIVITAMIN WITH MINERALS) TABS tablet Take 1 tablet by mouth daily.     nitroGLYCERIN (NITROSTAT) 0.4 MG SL tablet Place 1 tablet (0.4 mg total) under the tongue every 5 (five) minutes as needed for chest pain. 25 tablet 3   Omega 3 1200 MG CAPS Take 2,400 mg by mouth daily.     pantoprazole (PROTONIX) 20 MG tablet TAKE ONE TABLET BY MOUTH ONCE DAILY. 90 tablet 0   potassium chloride SA (KLOR-CON M) 20 MEQ tablet TAKE ONE TABLET BY MOUTH ONCE DAILY. 90 tablet 1   metFORMIN (GLUCOPHAGE-XR) 500 MG 24 hr tablet TAKE 1 TABLET BY MOUTHDAILY WITH BREAKFAST 90 tablet 0   metoprolol succinate (TOPROL-XL) 50 MG 24 hr tablet Take 1 tablet (50 mg total) by mouth at bedtime. Take with or immediately following a meal. (Patient taking differently: Take 50 mg by mouth every evening. Take with or immediately following a meal.) 90 tablet 3   No facility-administered medications prior to visit.    Allergies  Allergen Reactions   Lisinopril Swelling    Swelling neck   Crestor [Rosuvastatin] Other (See Comments)    Severe muscle aches   Chantix [Varenicline] Other (See Comments)    Abdominal pain   Neosporin [Neomycin-Bacitracin Zn-Polymyx] Rash  blistered    ROS Review of Systems  Constitutional:   Positive for fatigue. Negative for chills and fever.  HENT:  Negative for congestion and sore throat.   Eyes:  Negative for pain and discharge.  Respiratory:  Positive for shortness of breath. Negative for cough.   Cardiovascular:  Negative for chest pain and palpitations.  Gastrointestinal:  Negative for diarrhea, nausea and vomiting.  Endocrine: Negative for polydipsia and polyuria.  Genitourinary:  Negative for dysuria and hematuria.  Musculoskeletal:  Positive for arthralgias and back pain. Negative for neck pain and neck stiffness.  Skin:  Negative for rash.  Neurological:  Negative for numbness and headaches.  Psychiatric/Behavioral:  Negative for agitation and behavioral problems.       Objective:    Physical Exam Vitals reviewed.  Constitutional:      General: He is not in acute distress.    Appearance: He is not diaphoretic.  HENT:     Head: Normocephalic and atraumatic.     Nose: Nose normal.     Mouth/Throat:     Mouth: Mucous membranes are moist.  Eyes:     General: No scleral icterus.    Extraocular Movements: Extraocular movements intact.  Cardiovascular:     Rate and Rhythm: Normal rate and regular rhythm.     Pulses: Normal pulses.     Heart sounds: Normal heart sounds. No murmur heard. Pulmonary:     Breath sounds: Normal breath sounds. No wheezing or rales.  Abdominal:     Palpations: Abdomen is soft.     Tenderness: There is no abdominal tenderness.  Musculoskeletal:     Cervical back: Neck supple. No tenderness.     Right lower leg: No edema.     Left lower leg: No edema.  Skin:    General: Skin is warm.     Findings: No rash.  Neurological:     General: No focal deficit present.     Mental Status: He is alert and oriented to person, place, and time.  Psychiatric:        Mood and Affect: Mood normal.        Behavior: Behavior normal.     BP 134/70 (BP Location: Right Arm, Cuff Size: Normal)   Pulse 83   Resp 18   Ht '5\' 8"'  (1.727 m)   Wt  185 lb 9.6 oz (84.2 kg)   SpO2 97%   BMI 28.22 kg/m  Wt Readings from Last 3 Encounters:  02/11/22 185 lb 9.6 oz (84.2 kg)  10/21/21 190 lb 6.4 oz (86.4 kg)  10/08/21 188 lb (85.3 kg)    No results found for: "TSH" Lab Results  Component Value Date   WBC 8.9 08/14/2021   HGB 15.5 08/14/2021   HCT 43.8 08/14/2021   MCV 88 08/14/2021   PLT 227 08/14/2021   Lab Results  Component Value Date   NA 140 08/14/2021   K 4.0 08/14/2021   CO2 24 08/14/2021   GLUCOSE 93 08/14/2021   BUN 12 08/14/2021   CREATININE 0.79 08/14/2021   BILITOT 0.3 08/14/2021   ALKPHOS 59 08/14/2021   AST 27 08/14/2021   ALT 32 08/14/2021   PROT 7.2 08/14/2021   ALBUMIN 4.8 08/14/2021   CALCIUM 9.5 08/14/2021   ANIONGAP 7 03/19/2021   EGFR 99 08/14/2021   Lab Results  Component Value Date   CHOL 163 08/14/2021   Lab Results  Component Value Date   HDL 45 08/14/2021   Lab Results  Component Value Date   LDLCALC 75 08/14/2021   Lab Results  Component Value Date   TRIG 262 (H) 08/14/2021   Lab Results  Component Value Date   CHOLHDL 3.6 08/14/2021   Lab Results  Component Value Date   HGBA1C 6.5 (H) 08/14/2021      Assessment & Plan:   Problem List Items Addressed This Visit       Cardiovascular and Mediastinum   Coronary artery disease    S/p stent placement On aspirin and statin Was on Plavix, was discontinued due to GI bleeding Currently does not have chest pain On beta-blocker, Imdur and amlodipine Followed by Cardiology      Essential hypertension    BP Readings from Last 1 Encounters:  02/11/22 134/70  Well-controlled Counseled for compliance with the medications Advised DASH diet and moderate exercise/walking, at least 150 mins/week       Chronic heart failure with preserved ejection fraction ([place])    Appears euvolemic currently Started Jardiance Followed by cardiology      Relevant Medications   empagliflozin (JARDIANCE) 10 MG TABS tablet      Respiratory   Simple chronic bronchitis ([facility])    Has been seen by Dr. Halford Chessman Was given Symbicort, but has been using it as needed instead of regularly Added albuterol inhaler as needed for dyspnea/wheezing        Endocrine   Diabetes mellitus ([place]) - Primary    Lab Results  Component Value Date   HGBA1C 6.5 (H) 08/14/2021   Had diarrhea and presyncope with metformin DC metformin, started Jardiance considering his cardiac history Advised to follow diabetic diet On statin F/u CMP and lipid panel Diabetic foot exam: Today Diabetic eye exam: Advised to follow up with Ophthalmology for diabetic eye exam      Relevant Medications   empagliflozin (JARDIANCE) 10 MG TABS tablet   Other Relevant Orders   Microalbumin / creatinine urine ratio   Basic Metabolic Panel (BMET)   Hemoglobin A1c     Musculoskeletal and Integument   Onychomycosis    Advised to use Lamisil cream        Other   Hyperlipidemia    Last lipid profile reviewed On Lipitor currently       Meds ordered this encounter  Medications   empagliflozin (JARDIANCE) 10 MG TABS tablet    Sig: Take 1 tablet (10 mg total) by mouth daily before breakfast.    Dispense:  30 tablet    Refill:  5    Follow-up: Return in about 4 months (around 06/13/2022) for DM and HTN.    Lindell Spar, MD

## 2022-02-11 NOTE — Patient Instructions (Signed)
Please stop taking Metformin. Please start taking Jardiance.  Please continue to take other medications as prescribed.  Please continue to follow low carb diet and ambulate as tolerated.

## 2022-02-11 NOTE — Assessment & Plan Note (Signed)
Has been seen by Dr. Halford Chessman Was given Symbicort, but has been using it as needed instead of regularly Added albuterol inhaler as needed for dyspnea/wheezing

## 2022-02-11 NOTE — Assessment & Plan Note (Signed)
Appears euvolemic currently Started Jardiance Followed by cardiology

## 2022-02-11 NOTE — Assessment & Plan Note (Signed)
S/p stent placement On aspirin and statin Was on Plavix, was discontinued due to GI bleeding Currently does not have chest pain On beta-blocker, Imdur and amlodipine Followed by Cardiology

## 2022-02-11 NOTE — Assessment & Plan Note (Signed)
Advised to use Lamisil cream

## 2022-02-11 NOTE — Telephone Encounter (Signed)
Courtney, please find out if patient is still taking pantoprazole.  At his last visit, he was planning to taper off.

## 2022-02-11 NOTE — Telephone Encounter (Signed)
Refill request for pantoprazole sod dr 20 mg was received from [facility]. Pt's last ov was 10/21/2021

## 2022-02-11 NOTE — Assessment & Plan Note (Addendum)
Last lipid profile reviewed On Lipitor currently 

## 2022-02-11 NOTE — Assessment & Plan Note (Signed)
BP Readings from Last 1 Encounters:  02/11/22 134/70   Well-controlled Counseled for compliance with the medications Advised DASH diet and moderate exercise/walking, at least 150 mins/week

## 2022-02-11 NOTE — Assessment & Plan Note (Signed)
Lab Results  Component Value Date   HGBA1C 6.5 (H) 08/14/2021    Had diarrhea and presyncope with metformin DC metformin, started Jardiance considering his cardiac history Advised to follow diabetic diet On statin F/u CMP and lipid panel Diabetic foot exam: Today Diabetic eye exam: Advised to follow up with Ophthalmology for diabetic eye exam

## 2022-02-12 NOTE — Telephone Encounter (Signed)
Spoke to pt, he informed me that he has stop taking Pantoprazole. He states he is feeling better.

## 2022-02-12 NOTE — Telephone Encounter (Signed)
Great. No need for refill.

## 2022-02-13 LAB — MICROALBUMIN / CREATININE URINE RATIO
Creatinine, Urine: 169.4 mg/dL
Microalb/Creat Ratio: 17 mg/g creat (ref 0–29)
Microalbumin, Urine: 29.6 ug/mL

## 2022-02-18 NOTE — Progress Notes (Unsigned)
Cardiology Office Note    Date:  02/19/2022   ID:  Duane Adams, DOB August 19, 1955, MRN [id number]  PCP:  Lindell Spar, MD  Cardiologist: Carlyle Dolly, MD    No chief complaint on file.   History of Present Illness:    Duane Adams is a 66 y.o. male with past medical history of CAD (s/p DES to LAD and DES to RCA in 03/2013, patent stents by repeat cath in 08/2016 and 01/2021), HTN, HLD, PAD (with known left subclavian stenosis) and tobacco use who presents to the office today for overdue 50-monthfollow-up.  He was last examined by myself in 03/2021 following a recent admission for rectal bleeding with GI recommended outpatient colonoscopy. At the time of his follow-up visit, he reported overall doing well and denied any recent anginal symptoms. Plavix has been discontinued during his recent admission given no recent cardiac interventions and he was to remain off of this while continuing on ASA, Atorvastatin, Imdur, Amlodipine and Toprol-XL.  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   Concussion several yrs ago   no residual   Coronary artery disease    a. 03/2013 Cath/PCI: LM nl, LAD 834m2.5x20 Promus Premier DES), LCX min irregs, RCA dom, 90p(3.0x20 Promus Premier DES), EF 55-65%. b. patent stents by caths in 08/2016 and 01/2021   [place]re[place]   slight memory issues   Essential hypertension, benign    GERD (gastroesophageal reflux disease)    Helicobacter pylori gastritis JUN 2016 EGD/Bx   PREVPAK BID FOR 14 DAYS   Hepatitis    Hepatitis C treated  2011   Hiatal hernia    History of kidney stones    Mixed hyperlipidemia    Neuroma    Prediabetes 06/23/2019   Skin cancer    Substance abuse ([place]   alcoholic quit 203734 Tobacco abuse     Past Surgical History:  Procedure Laterality Date   ABDOMINAL AORTOGRAM W/LOWER EXTREMITY N/A  06/15/2017   Procedure: ABDOMINAL AORTOGRAM W/LOWER EXTREMITY;  Surgeon: DiAngelia MouldMD;  [facility];  Service: Cardiovascular;  Laterality: N/A;  Bilateral   BIOPSY N/A 12/19/2014   Procedure: BIOPSY;  Surgeon: SaDanie BinderMD;  Location: [facility];  Service: Endoscopy;  Laterality: N/A;   COLONOSCOPY  12/2014   [facility]: with endoscopic mucosal resection. 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 Needs colonoscopy in 1 year.    COLONOSCOPY WITH PROPOFOL N/A 12/19/2014   Dr. FiOneida Alarsix simple adenomas and 3 hyperplastic polyps. [facility] flat mid-transverse colon polyp [facility]   COLONOSCOPY WITH PROPOFOL N/A 04/01/2021   Surgeon: CaEloise HarmanDO;Nonbleeding internal hemorrhoids, diverticulosis in the sigmoid colon, tattoo in transverse colon with tattoo site appearing normal.  Recommended repeat colonoscopy in 5 years.   CORONARY ANGIOPLASTY WITH STENT PLACEMENT  04/11/2013   LAD &  RCA     DR COOPER   ESOPHAGOGASTRODUODENOSCOPY (EGD) WITH PROPOFOL N/A 12/19/2014   Dr. FiOneida Alar without varices, small hiatal hernia noted, moderate non-erosive gastritis and mild duodenitis.  POSITIVE H.PYLORI. Prescribed Prevpac.   EXCISION MORTON'S NEUROMA Left 08/15/2020   Procedure: EXCISION MORTON'S NEUROMA THIRD INTERSPACE LEFT FOOT;  Surgeon: PaFelipa FurnaceDPM;  Location: [facility];  Service: Podiatry;  Laterality: Left;   EXTRACORPOREAL SHOCK WAVE LITHOTRIPSY  2001   FLEXIBLE SIGMOIDOSCOPY N/A 12/23/2017   Procedure: FLEXIBLE SIGMOIDOSCOPY;  Surgeon: Danie Binder, MD;  [facility];  Service: Endoscopy;  Laterality: N/A;  1:45pm   HAND RECONSTRUCTION Left    HEMORRHOID BANDING N/A 12/23/2017   Procedure: HEMORRHOID BANDING;  Surgeon: Danie Binder, MD;  [facility];  Service: Endoscopy;  Laterality: N/A;   LEFT HEART CATH AND CORONARY  ANGIOGRAPHY N/A 01/22/2021   Procedure: LEFT HEART CATH AND CORONARY ANGIOGRAPHY;  Surgeon: Martinique, Peter M, MD;  Location: [facility];  Service: Cardiovascular;  Laterality: N/A;   LEFT HEART CATHETERIZATION WITH CORONARY ANGIOGRAM N/A 04/11/2013   Procedure: LEFT HEART CATHETERIZATION WITH CORONARY ANGIOGRAM;  Surgeon: Blane Ohara, MD;  Location: [facility];  Service: Cardiovascular;  Laterality: N/A;   LEG TENDON SURGERY Right    LIPOMA EXCISION Left 09/13/2021   Procedure: EXCISION LIPOMA; SHOULDER, 4CM;  Surgeon: Rusty Aus, DO;  Location: [facility];  Service: General;  Laterality: Left;   MASS EXCISION N/A 09/13/2021   Procedure: EXCISION MASS; BACK, 3CM;  Surgeon: Rusty Aus, DO;  Location: [facility];  Service: General;  Laterality: N/A;   POLYPECTOMY N/A 12/19/2014   Procedure: POLYPECTOMY;  Surgeon: Danie Binder, MD;  Location: [facility];  Service: Endoscopy;  Laterality: N/A;    Current Medications: Outpatient Medications Prior to Visit  Medication Sig Dispense Refill   albuterol (VENTOLIN HFA) 108 (90 Base) MCG/ACT inhaler Inhale 2 puffs into the lungs every 6 (six) hours as needed for wheezing or shortness of breath. 8 g 5   amLODipine (NORVASC) 10 MG tablet Take 1 tablet (10 mg total) by mouth daily. 90 tablet 3   aspirin EC 81 MG tablet Take 81 mg by mouth at bedtime.      atorvastatin (LIPITOR) 80 MG tablet TAKE ONE TABLET BY MOUTH ONCE DAILY. 30 tablet 0   budesonide-formoterol (SYMBICORT) 160-4.5 MCG/ACT inhaler Inhale 2 puffs into the lungs 2 (two) times daily. 1 each 6   chlorthalidone (HYGROTON) 25 MG tablet TAKE 1 TABLET BY MOUTH DAILY. 90 tablet 1   Cholecalciferol (VITAMIN D3) 2000 units TABS Take 4,000 Units by mouth 2 (two) times daily.     cloNIDine (CATAPRES) 0.1 MG tablet TAKE (1) TABLET BY MOUTH TWICE DAILY. 60 tablet 0   empagliflozin (JARDIANCE) 10 MG TABS tablet Take 1 tablet (10 mg total) by mouth daily before breakfast. 30 tablet  5   isosorbide mononitrate (IMDUR) 30 MG 24 hr tablet Take 1 tablet (30 mg total) by mouth daily. 90 tablet 3   metoprolol succinate (TOPROL-XL) 100 MG 24 hr tablet Take 1 tablet (100 mg total) by mouth daily. Take in addition to '50mg'$  daily for a total of '150mg'$  daily. (Patient taking differently: Take 100 mg by mouth in the morning.) 90 tablet 3   metoprolol succinate (TOPROL-XL) 50 MG 24 hr tablet Take 1 tablet (50 mg total) by mouth at bedtime. Take with or immediately following a meal. (Patient taking differently: Take 50 mg by mouth every evening. Take with or immediately following a meal.) 90 tablet 3   Multiple Vitamin (MULTIVITAMIN WITH MINERALS) TABS tablet Take 1 tablet by mouth daily.     nitroGLYCERIN (NITROSTAT) 0.4 MG SL tablet Place 1 tablet (0.4 mg total) under the tongue every 5 (five) minutes as needed for chest pain. 25 tablet 3  Omega 3 1200 MG CAPS Take 2,400 mg by mouth daily.     pantoprazole (PROTONIX) 20 MG tablet TAKE ONE TABLET BY MOUTH ONCE DAILY. 90 tablet 0   potassium chloride SA (KLOR-CON M) 20 MEQ tablet TAKE ONE TABLET BY MOUTH ONCE DAILY. 90 tablet 1   No facility-administered medications prior to visit.     Allergies:   Lisinopril, Crestor [rosuvastatin], Chantix [varenicline], and Neosporin [neomycin-bacitracin zn-polymyx]   Social History   Socioeconomic History   Marital status: Soil scientist    Spouse name: Tisha   Number of children: 2   Years of education: 14   Highest education level: Not on file  Occupational History   Occupation: disabled    Comment: heart  Tobacco Use   Smoking status: Light Smoker    Packs/day: 0.75    Years: 50.00    Total pack years: 37.50    Types: Cigarettes    Start date: 07/14/1968   Smokeless tobacco: Never   Tobacco comments:    5-6 Cigarettes per day  Vaping Use   Vaping Use: Never used  Substance and Sexual Activity   Alcohol use: Yes    Comment: 2-3 beer a couple times a year (10/21/21) Previously 5-6  beer every 2 weeks.   Drug use: Not Currently    Types: Cocaine, Marijuana    Comment: cocaine marijuana and acid last used 1984   Sexual activity: Not Currently  Other Topics Concern   Not on file  Social History Narrative   Disabled from heart disease   Previously worked in Scientist, research (medical) with Neurosurgeon for 28 years   Leisure: Teaching laboratory technician   Social Determinants of Radio broadcast assistant Strain: Not on file  Food Insecurity: Not on file  Transportation Needs: Not on file  Physical Activity: Not on file  Stress: Not on file  Social Connections: Not on file     Family History:  The patient's ***family history includes Alcohol abuse in his brother; Aneurysm in his mother; Diabetes in his paternal aunt; Heart disease in his brother and father; Hypertension in his brother, father, and mother; Stroke in his mother.   Review of Systems:    Please see the history of present illness.     All other systems reviewed and are otherwise negative except as noted above.   Physical Exam:    VS:  There were no vitals taken for this visit.   General: Well developed, well nourished,male appearing in no acute distress. Head: Normocephalic, atraumatic. Neck: No carotid bruits. JVD not elevated.  Lungs: Respirations regular and unlabored, without wheezes or rales.  Heart: ***Regular rate and rhythm. No S3 or S4.  No murmur, no rubs, or gallops appreciated. Abdomen: Appears non-distended. No obvious abdominal masses. Msk:  Strength and tone appear normal for age. No obvious joint deformities or effusions. Extremities: No clubbing or cyanosis. No edema.  Distal pedal pulses are 2+ bilaterally. Neuro: Alert and oriented X 3. Moves all extremities spontaneously. No focal deficits noted. Psych:  Responds to questions appropriately with a normal affect. Skin: No rashes or lesions noted  Wt Readings from Last 3 Encounters:  02/11/22 185 lb 9.6 oz (84.2 kg)  10/21/21 190 lb 6.4 oz (86.4 kg)   10/08/21 188 lb (85.3 kg)        Studies/Labs Reviewed:   EKG:  EKG is*** ordered today.  The ekg ordered today demonstrates ***  Recent Labs: 03/18/2021: Magnesium 1.8 08/14/2021: ALT 32; BUN 12; Creatinine,  Ser 0.79; Hemoglobin 15.5; Platelets 227; Potassium 4.0; Sodium 140   Lipid Panel    Component Value Date/Time   CHOL 163 08/14/2021 1345   TRIG 262 (H) 08/14/2021 1345   HDL 45 08/14/2021 1345   CHOLHDL 3.6 08/14/2021 1345   CHOLHDL 4.9 12/17/2020 1458   VLDL UNABLE TO CALCULATE IF TRIGLYCERIDE OVER 400 mg/dL 02/01/2019 0425   LDLCALC 75 08/14/2021 1345   LDLCALC  12/17/2020 1458     Comment:     . LDL cholesterol not calculated. Triglyceride levels greater than 400 mg/dL invalidate calculated LDL results. . Reference range: <100 . Desirable range <100 mg/dL for primary prevention;   <70 mg/dL for patients with CHD or diabetic patients  with > or = 2 CHD risk factors. Marland Kitchen LDL-C is now calculated using the Martin-Hopkins  calculation, which is a validated novel method providing  better accuracy than the Friedewald equation in the  estimation of LDL-C.  Cresenciano Genre et al. Annamaria Helling. 9798;921(19): 2061-2068  (http://education.Quest[employer].com/faq/FAQ164)    LDLDIRECT 100.2 (H) 02/01/2019 0425    Additional studies/ records that were reviewed today include:   LHC: 01/2021 Previously placed Prox LAD to Mid LAD stent (unknown type) is widely patent. 1st Diag lesion is 35% stenosed. Mid LAD lesion is 25% stenosed. Ost Cx to Prox Cx lesion is 40% stenosed. Ost RCA to Prox RCA lesion is 10% stenosed. Prox RCA lesion is 30% stenosed. The left ventricular systolic function is normal. LV end diastolic pressure is normal. The left ventricular ejection fraction is 55-65% by visual estimate.   1. Nonobstructive CAD. Excellent patency of stents in the LAD and RCA 2. Normal LV function 3. Normal LV EDP   Plan: continue medical therapy. Consider other noncardiac causes for  symptoms.  Assessment:    No diagnosis found.   Plan:   In order of problems listed above:  1. CAD - He is s/p DES to LAD and DES to RCA in 03/2013 with patent stents by repeat cath in 08/2016 and 01/2021. ***  2. HTN - His BP is at *** during today's visit. Continue current medication regimen.   3. HLD - FLP in 08/2021 showed his cholesterol had significantly improved compared to prior values his total cholesterol was at 163, triglycerides 262 (previously 931 in 2022), HDL 45 and LDL 75. He remains on Atorvastatin 80 mg daily.  4. PAD  - He has known left subclavian stenosis and infra inguinal arterial disease which is followed by Vascular Surgery.   Shared Decision Making/Informed Consent:   {Are you ordering a CV Procedure (e.g. stress test, cath, DCCV, TEE, etc)?   Press F2        :[id number]}    Medication Adjustments/Labs and Tests Ordered: Current medicines are reviewed at length with the patient today.  Concerns regarding medicines are outlined above.  Medication changes, Labs and Tests ordered today are listed in the Patient Instructions below. There are no Patient Instructions on file for this visit.   Signed, Erma Heritage, PA-C  02/19/2022 9:49 AM    [facility] [address] [place], [place] [address] Phone: [phone] Fax: [phone]

## 2022-02-19 ENCOUNTER — Encounter: Payer: Self-pay | Admitting: Student

## 2022-02-20 ENCOUNTER — Encounter: Payer: Self-pay | Admitting: Student

## 2022-02-20 ENCOUNTER — Ambulatory Visit (INDEPENDENT_AMBULATORY_CARE_PROVIDER_SITE_OTHER): Payer: Medicare Other | Admitting: Student

## 2022-02-20 VITALS — BP 130/68 | HR 82 | Ht 69.0 in | Wt 184.6 lb

## 2022-02-20 DIAGNOSIS — I10 Essential (primary) hypertension: Secondary | ICD-10-CM

## 2022-02-20 DIAGNOSIS — I2510 Atherosclerotic heart disease of native coronary artery without angina pectoris: Secondary | ICD-10-CM | POA: Diagnosis not present

## 2022-02-20 DIAGNOSIS — I739 Peripheral vascular disease, unspecified: Secondary | ICD-10-CM | POA: Diagnosis not present

## 2022-02-20 DIAGNOSIS — E785 Hyperlipidemia, unspecified: Secondary | ICD-10-CM | POA: Diagnosis not present

## 2022-02-20 NOTE — Patient Instructions (Signed)
Medication Instructions:  Your physician recommends that you continue on your current medications as directed. Please refer to the Current Medication list given to you today.  *If you need a refill on your cardiac medications before your next appointment, please call your pharmacy*   Lab Work: NONE   If you have labs (blood work) drawn today and your tests are completely normal, you will receive your results only by: MyChart Message (if you have MyChart) OR A paper copy in the mail If you have any lab test that is abnormal or we need to change your treatment, we will call you to review the results.   Testing/Procedures: NONE    Follow-Up: At [facility], you and your health needs are our priority.  As part of our continuing mission to provide you with exceptional heart care, we have created designated Provider Care Teams.  These Care Teams include your primary Cardiologist (physician) and Advanced Practice Providers (APPs -  Physician Assistants and Nurse Practitioners) who all work together to provide you with the care you need, when you need it.  We recommend signing up for the patient portal called "MyChart".  Sign up information is provided on this After Visit Summary.  MyChart is used to connect with patients for Virtual Visits (Telemedicine).  Patients are able to view lab/test results, encounter notes, upcoming appointments, etc.  Non-urgent messages can be sent to your provider as well.   To learn more about what you can do with MyChart, go to [email or web address].    Your next appointment:   6 month(s)  The format for your next appointment:   In Person  Provider:   Jonathan Branch, MD    Other Instructions Thank you for choosing [facility]!    Important Information About Sugar       

## 2022-02-21 ENCOUNTER — Telehealth: Payer: Self-pay

## 2022-02-21 NOTE — Telephone Encounter (Signed)
Per patient he has medicare part B and healthy blue.  But per wife has medicare part B and something else.  Patient will bring by insurance cards to our office to get the prior authorization.

## 2022-02-21 NOTE — Telephone Encounter (Signed)
noted 

## 2022-03-08 LAB — BASIC METABOLIC PANEL
BUN/Creatinine Ratio: 14 (ref 10–24)
BUN: 10 mg/dL (ref 8–27)
CO2: 20 mmol/L (ref 20–29)
Calcium: 9.9 mg/dL (ref 8.6–10.2)
Chloride: 99 mmol/L (ref 96–106)
Creatinine, Ser: 0.7 mg/dL — ABNORMAL LOW (ref 0.76–1.27)
Glucose: 111 mg/dL — ABNORMAL HIGH (ref 70–99)
Potassium: 3.7 mmol/L (ref 3.5–5.2)
Sodium: 137 mmol/L (ref 134–144)
eGFR: 102 mL/min/{1.73_m2} (ref >59)

## 2022-03-08 LAB — HEMOGLOBIN A1C
Est. average glucose Bld gHb Est-mCnc: 128 mg/dL
Hgb A1c MFr Bld: 6.1 % — ABNORMAL HIGH (ref 4.8–5.6)

## 2022-03-14 ENCOUNTER — Other Ambulatory Visit: Payer: Self-pay | Admitting: Cardiology

## 2022-03-19 ENCOUNTER — Other Ambulatory Visit: Payer: Self-pay | Admitting: Cardiology

## 2022-04-02 ENCOUNTER — Encounter: Payer: Self-pay | Admitting: *Deleted

## 2022-04-14 ENCOUNTER — Other Ambulatory Visit: Payer: Self-pay | Admitting: Cardiology

## 2022-04-14 ENCOUNTER — Other Ambulatory Visit: Payer: Self-pay | Admitting: Student

## 2022-05-12 ENCOUNTER — Other Ambulatory Visit: Payer: Self-pay | Admitting: *Deleted

## 2022-05-12 DIAGNOSIS — I5032 Chronic diastolic (congestive) heart failure: Secondary | ICD-10-CM

## 2022-05-12 DIAGNOSIS — E1169 Type 2 diabetes mellitus with other specified complication: Secondary | ICD-10-CM

## 2022-05-12 MED ORDER — EMPAGLIFLOZIN 10 MG PO TABS: 10.0000 mg | ORAL_TABLET | Freq: Every day | ORAL | 5 refills | Status: DC

## 2022-05-12 MED ORDER — EMPAGLIFLOZIN 10 MG PO TABS: 10.0000 mg | ORAL_TABLET | Freq: Every day | ORAL | 3 refills | Status: DC

## 2022-05-30 ENCOUNTER — Other Ambulatory Visit: Payer: Self-pay | Admitting: Physician Assistant

## 2022-05-30 ENCOUNTER — Other Ambulatory Visit: Payer: Self-pay | Admitting: Student

## 2022-06-16 ENCOUNTER — Ambulatory Visit (INDEPENDENT_AMBULATORY_CARE_PROVIDER_SITE_OTHER): Payer: Medicare Other | Admitting: Internal Medicine

## 2022-06-16 ENCOUNTER — Encounter: Payer: Self-pay | Admitting: Internal Medicine

## 2022-06-16 VITALS — BP 138/70 | HR 84 | Ht 69.0 in | Wt 183.8 lb

## 2022-06-16 DIAGNOSIS — R351 Nocturia: Secondary | ICD-10-CM

## 2022-06-16 DIAGNOSIS — I5032 Chronic diastolic (congestive) heart failure: Secondary | ICD-10-CM

## 2022-06-16 DIAGNOSIS — I10 Essential (primary) hypertension: Secondary | ICD-10-CM | POA: Diagnosis not present

## 2022-06-16 DIAGNOSIS — E1169 Type 2 diabetes mellitus with other specified complication: Secondary | ICD-10-CM | POA: Diagnosis not present

## 2022-06-16 DIAGNOSIS — Z2821 Immunization not carried out because of patient refusal: Secondary | ICD-10-CM

## 2022-06-16 DIAGNOSIS — E782 Mixed hyperlipidemia: Secondary | ICD-10-CM

## 2022-06-16 DIAGNOSIS — F17218 Nicotine dependence, cigarettes, with other nicotine-induced disorders: Secondary | ICD-10-CM

## 2022-06-16 DIAGNOSIS — N401 Enlarged prostate with lower urinary tract symptoms: Secondary | ICD-10-CM

## 2022-06-16 DIAGNOSIS — E559 Vitamin D deficiency, unspecified: Secondary | ICD-10-CM

## 2022-06-16 DIAGNOSIS — E1140 Type 2 diabetes mellitus with diabetic neuropathy, unspecified: Secondary | ICD-10-CM

## 2022-06-16 LAB — POCT GLYCOSYLATED HEMOGLOBIN (HGB A1C): HbA1c, POC (controlled diabetic range): 6.1 % (ref 0.0–7.0)

## 2022-06-16 NOTE — Patient Instructions (Signed)
Please continue taking medications as prescribed.  Please continue to follow low carb diet and ambulate as tolerated.  Please get fasting blood tests done before the next visit.

## 2022-06-16 NOTE — Assessment & Plan Note (Signed)
Appears euvolemic currently On Jardiance Followed by cardiology

## 2022-06-16 NOTE — Assessment & Plan Note (Signed)
Lab Results  Component Value Date   HGBA1C 6.1 (H) 03/07/2022   Well-controlled Had diarrhea and presyncope with metformin DC metformin, on Jardiance considering his cardiac history Advised to follow diabetic diet On statin F/u CMP and lipid panel Diabetic eye exam: Advised to follow up with Ophthalmology for diabetic eye exam  Isolated episodes of numbness and tingling of the feet could be due to diabetic neuropathy, would prefer to avoid any medicine for now

## 2022-06-16 NOTE — Assessment & Plan Note (Signed)
Last lipid profile reviewed On Lipitor currently

## 2022-06-16 NOTE — Assessment & Plan Note (Signed)
Smokes about 0.5 pack/day  Asked about quitting: confirms that he/she currently smokes cigarettes Advise to quit smoking: Educated about QUITTING to reduce the risk of cancer, cardio and cerebrovascular disease. Assess willingness: Unwilling to quit at this time, but is working on cutting back. Assist with counseling and pharmacotherapy: Counseled for 5 minutes and literature provided. Arrange for follow up: follow up in 3 months and continue to offer help. 

## 2022-06-16 NOTE — Progress Notes (Unsigned)
Established Patient Office Visit  Subjective:  Patient ID: Duane Adams, male    DOB: [birth year]  Age: 66 y.o. MRN: [id number]  CC:  Chief Complaint  Patient presents with   Follow-up    Burning and tingling in feet twice a month     HPI Duane Adams is a 66 y.o. male with past medical history of HTN, CAD s/p stent placement, PAD, type 2 DM, HLD, COPD, GERD, liver fibrosis, chronic low back pain and tobacco abuse who presents for f/u of his chronic medical conditions.  CAD, PAD and HTN: BP is well-controlled. Takes medications regularly. Patient denies headache, dizziness, chest pain, or palpitations.  He follows up with cardiology for history of CAD.  He also saw vascular surgery for history of subclavian vein stenosis.  Type II DM: His last HbA1c was 6.1. He has been tolerating Jardiance well.  He denies any polyuria, polyphagia or chronic fatigue.  He reports burning and tingling sensation in his feet twice in the last 1 month.  Each episode lasted for only few minutes. He denies any recent injury.      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   Concussion several yrs ago   no residual   Coronary artery disease    a. 03/2013 Cath/PCI: LM nl, LAD 39m(2.5x20 Promus Premier DES), LCX min irregs, RCA dom, 90p(3.0x20 Promus Premier DES), EF 55-65%. b. patent stents by caths in 08/2016 and 01/2021   [place]   slight memory issues   Essential hypertension, benign    GERD (gastroesophageal reflux disease)    Helicobacter pylori gastritis JUN 2016 EGD/Bx   PREVPAK BID FOR 14 DAYS   Hepatitis    Hepatitis C treated  2011   Hiatal hernia    History of kidney stones    Mixed hyperlipidemia    Neuroma    Prediabetes 06/23/2019   Skin cancer    Substance [place] ([place]    alcoholic quit [id number]  Tobacco abuse     Past Surgical History:   Procedure Laterality Date   ABDOMINAL AORTOGRAM W/LOWER EXTREMITY N/A 06/15/2017   Procedure: ABDOMINAL AORTOGRAM W/LOWER EXTREMITY;  Surgeon: DAngelia Mould MD;  [place];  Service: Cardiovascular;  Laterality: N/A;  Bilateral   BIOPSY N/A 12/19/2014   Procedure: BIOPSY;  Surgeon: SDanie Binder MD;  Location: [facility];  Service: Endoscopy;  Laterality: N/A;   COLONOSCOPY  12/2014   [facility]: with endoscopic mucosal resection. 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 Needs colonoscopy in 1 year.    COLONOSCOPY WITH PROPOFOL N/A 12/19/2014   Dr. FOneida Alar six simple adenomas and 3 hyperplastic polyps. [facility] flat [facility]-transverse colon polyp [facility]   COLONOSCOPY WITH PROPOFOL N/A 04/01/2021   Surgeon: CEloise Harman DO;Nonbleeding internal hemorrhoids, diverticulosis in the sigmoid colon, tattoo in transverse colon with tattoo site appearing normal.  Recommended repeat colonoscopy in 5 years.   CORONARY ANGIOPLASTY WITH STENT PLACEMENT  04/11/2013   LAD &  RCA     DR COOPER   ESOPHAGOGASTRODUODENOSCOPY (EGD) WITH PROPOFOL N/A 12/19/2014   Dr. FOneida Alar  without varices, small hiatal hernia noted, moderate non-erosive gastritis and mild duodenitis.  POSITIVE H.PYLORI. Prescribed Prevpac.   EXCISION MORTON'S NEUROMA Left  08/15/2020   Procedure: EXCISION MORTON'S NEUROMA THIRD INTERSPACE LEFT FOOT;  Surgeon: [name], DPM;  Location: [place];  Service: Podiatry;  Laterality: Left;   EXTRACORPOREAL SHOCK WAVE LITHOTRIPSY  2001   FLEXIBLE SIGMOIDOSCOPY N/A 12/23/2017   Procedure: FLEXIBLE SIGMOIDOSCOPY;  Surgeon: Danie Binder, MD;  [facility];  Service: Endoscopy;  Laterality: N/A;  1:45pm   HAND RECONSTRUCTION Left    HEMORRHOID BANDING N/A 12/23/2017   Procedure: HEMORRHOID BANDING;  Surgeon: Danie Binder, MD;  [facility];  Service: Endoscopy;  Laterality: N/A;   LEFT HEART CATH AND CORONARY ANGIOGRAPHY N/A 01/22/2021   Procedure: LEFT HEART CATH AND CORONARY ANGIOGRAPHY;  Surgeon: Martinique, Peter M, MD;  Location: [place] [facility];  Service: Cardiovascular;  Laterality: N/A;   LEFT HEART CATHETERIZATION WITH CORONARY ANGIOGRAM N/A 04/11/2013   Procedure: LEFT HEART CATHETERIZATION WITH CORONARY ANGIOGRAM;  Surgeon: Blane Ohara, MD;  [facility];  Service: Cardiovascular;  Laterality: N/A;   LEG TENDON SURGERY Right    LIPOMA EXCISION Left 09/13/2021   Procedure: EXCISION LIPOMA; SHOULDER, 4CM;  Surgeon: Rusty Aus, DO;  Location: [facility];  Service: General;  Laterality: Left;   MASS EXCISION N/A 09/13/2021   Procedure: EXCISION MASS; BACK, 3CM;  Surgeon: Rusty Aus, DO;  Location: [facility];  Service: General;  Laterality: N/A;   POLYPECTOMY N/A 12/19/2014   Procedure: POLYPECTOMY;  Surgeon: Danie Binder, MD;  Location: [facility];  Service: Endoscopy;  Laterality: N/A;    Family History  Problem Relation Age of Onset   Stroke Mother    Aneurysm Mother    Hypertension Mother    Heart disease Father    Hypertension Father    Heart disease Brother    Hypertension Brother    Alcohol abuse Brother    Diabetes Paternal Aunt    Colon cancer Neg Hx     Social History   Socioeconomic History   Marital status: Soil scientist    Spouse name: Tisha   Number of children: 2   Years of education: 21   Highest education level: Not on file  Occupational History   Occupation: disabled    Comment: heart  Tobacco Use   Smoking status: Every Day    Packs/day: 0.50    Years: 50.00    Total pack years: 25.00    Types: Cigarettes    Start date: 07/14/1968   Smokeless tobacco: Never   Tobacco comments:    5-6 Cigarettes per day  Vaping Use   Vaping Use: Never used  Substance and Sexual Activity   Alcohol use: Yes    Comment: 2-3 beer a couple times a year (10/21/21)  Previously 5-6 beer every 2 weeks.   Drug use: Not Currently    Types: Cocaine, Marijuana    Comment: cocaine marijuana and acid last used 1984   Sexual activity: Not Currently  Other Topics Concern   Not on file  Social History Narrative   Disabled from heart disease   Previously worked in Scientist, research (medical) with Neurosurgeon for 28 years   Leisure: Teaching laboratory technician   Social Determinants of Radio broadcast assistant Strain: Not on file  Food Insecurity: Not on file  Transportation Needs: Not on file  Physical Activity: Not on file  Stress: Not on file  Social Connections: Not on file  Intimate Partner Violence: Not on file    Outpatient Medications Prior to Visit  Medication Sig Dispense Refill   albuterol (VENTOLIN HFA) 108 (90 Base) MCG/ACT inhaler Inhale 2 puffs into the lungs every 6 (six) hours as needed for wheezing or shortness of breath. 8 g 5   amLODipine (NORVASC) 10 MG tablet TAKE 1 TABLET BY MOUTH ONCE A DAY. 90 tablet 1   aspirin EC 81 MG tablet Take 81 mg by mouth at bedtime.      atorvastatin (LIPITOR) 80 MG tablet TAKE ONE TABLET BY MOUTH ONCE DAILY. 90 tablet 3   budesonide-formoterol (SYMBICORT) 160-4.5 MCG/ACT inhaler Inhale 2 puffs into the lungs 2 (two) times daily. (Patient not taking: Reported on 02/20/2022) 1 each 6   chlorthalidone (HYGROTON) 25 MG tablet TAKE 1 TABLET BY MOUTH DAILY. 90 tablet 1   Cholecalciferol (VITAMIN D3) 2000 units TABS Take 4,000 Units by mouth 2 (two) times daily.     cloNIDine (CATAPRES) 0.1 MG tablet TAKE (1) TABLET BY MOUTH TWICE DAILY. 180 tablet 3   empagliflozin (JARDIANCE) 10 MG TABS tablet Take 1 tablet (10 mg total) by mouth daily before breakfast. 90 tablet 3   isosorbide mononitrate (IMDUR) 30 MG 24 hr tablet TAKE 1 TABLET BY MOUTH ONCE DAILY. 90 tablet 1   metoprolol succinate (TOPROL-XL) 100 MG 24 hr tablet TAKE ONE TABLET BY MOUTH ONCE DAILY WITH 50MG. TOTAL OF 150MG DAILY. 90 tablet 3   metoprolol succinate (TOPROL-XL) 50 MG 24 hr  tablet TAKE 1 TABLET BY MOUTH WITH OR IMMEDIATELY FOLLOWING A MEAL ONCE A DAY. 90 tablet 1   Multiple Vitamin (MULTIVITAMIN WITH MINERALS) TABS tablet Take 1 tablet by mouth daily.     nitroGLYCERIN (NITROSTAT) 0.4 MG SL tablet Place 1 tablet (0.4 mg total) under the tongue every 5 (five) minutes as needed for chest pain. 25 tablet 3   Omega 3 1200 MG CAPS Take 2,400 mg by mouth daily.     pantoprazole (PROTONIX) 20 MG tablet TAKE ONE TABLET BY MOUTH ONCE DAILY. (Patient not taking: Reported on 02/20/2022) 90 tablet 0   potassium chloride SA (KLOR-CON M) 20 MEQ tablet TAKE ONE TABLET BY MOUTH ONCE DAILY. 90 tablet 1   No facility-administered medications prior to visit.    Allergies  Allergen Reactions   Lisinopril Swelling    Swelling neck   Crestor [Rosuvastatin] Other (See Comments)    Severe muscle aches   Chantix [Varenicline] Other (See Comments)    Abdominal pain   Neosporin [Neomycin-Bacitracin Zn-Polymyx] Rash    blistered    ROS Review of Systems  Constitutional:  Positive for fatigue. Negative for chills and fever.  HENT:  Negative for congestion and sore throat.   Eyes:  Negative for pain and discharge.  Respiratory:  Positive for shortness of breath. Negative for cough.   Cardiovascular:  Negative for chest pain and palpitations.  Gastrointestinal:  Negative for diarrhea, nausea and vomiting.  Endocrine: Negative for polydipsia and polyuria.  Genitourinary:  Negative for dysuria and hematuria.  Musculoskeletal:  Positive for arthralgias and back pain. Negative for neck pain and neck stiffness.  Skin:  Negative for rash.  Neurological:  Negative for weakness and headaches.  Psychiatric/Behavioral:  Negative for agitation and behavioral problems.       Objective:    Physical Exam Vitals reviewed.  Constitutional:      General: He is not in acute distress.    Appearance: He is not diaphoretic.  HENT:     Head: Normocephalic and atraumatic.     Nose: Nose  normal.  Mouth/Throat:     Mouth: Mucous membranes are moist.  Eyes:     General: No scleral icterus.    Extraocular Movements: Extraocular movements intact.  Cardiovascular:     Rate and Rhythm: Normal rate and regular rhythm.     Pulses: Normal pulses.     Heart sounds: Normal heart sounds. No murmur heard. Pulmonary:     Breath sounds: Normal breath sounds. No wheezing or rales.  Musculoskeletal:     Cervical back: Neck supple. No tenderness.     Right lower leg: No edema.     Left lower leg: No edema.  Skin:    General: Skin is warm.     Findings: No rash.  Neurological:     General: No focal deficit present.     Mental Status: He is alert and oriented to person, place, and time.     Sensory: No sensory deficit.     Motor: No weakness.  Psychiatric:        Mood and Affect: Mood normal.        Behavior: Behavior normal.     BP 138/70 (BP Location: Left Arm, Patient Position: Sitting, Cuff Size: Normal)   Pulse 84   Ht _0  (1.753 m)   Wt 183 lb 12.8 oz (83.4 kg)   SpO2 96%   BMI 27.14 kg/m  Wt Readings from Last 3 Encounters:  06/16/22 183 lb 12.8 oz (83.4 kg)  02/20/22 184 lb 9.6 oz (83.7 kg)  02/11/22 185 lb 9.6 oz (84.2 kg)    No results found for: "TSH" Lab Results  Component Value Date   WBC 8.9 08/14/2021   HGB 15.5 08/14/2021   HCT 43.8 08/14/2021   MCV 88 08/14/2021   PLT 227 08/14/2021   Lab Results  Component Value Date   NA 137 03/07/2022   K 3.7 03/07/2022   CO2 20 03/07/2022   GLUCOSE 111 (H) 03/07/2022   BUN 10 03/07/2022   CREATININE 0.70 (L) 03/07/2022   BILITOT 0.3 08/14/2021   ALKPHOS 59 08/14/2021   AST 27 08/14/2021   ALT 32 08/14/2021   PROT 7.2 08/14/2021   ALBUMIN 4.8 08/14/2021   CALCIUM 9.9 03/07/2022   ANIONGAP 7 03/19/2021   EGFR 102 03/07/2022   Lab Results  Component Value Date   CHOL 163 08/14/2021   Lab Results  Component Value Date   HDL 45 08/14/2021   Lab Results  Component Value Date   LDLCALC 75  08/14/2021   Lab Results  Component Value Date   TRIG 262 (H) 08/14/2021   Lab Results  Component Value Date   CHOLHDL 3.6 08/14/2021   Lab Results  Component Value Date   HGBA1C 6.1 06/16/2022      Assessment & Plan:   Problem List Items Addressed This Visit       Cardiovascular and Mediastinum   Essential hypertension - Primary    BP Readings from Last 1 Encounters:  06/16/22 138/70  Well-controlled Counseled for compliance with the medications Advised DASH diet and moderate exercise/walking, at least 150 mins/week      Relevant Orders   TSH   CMP14+EGFR   CBC with Differential/Platelet   Chronic heart failure with preserved ejection fraction ([place])    Appears euvolemic currently On Jardiance Followed by cardiology      Relevant Orders   TSH     Endocrine   Diabetes mellitus ([place])    Lab Results  Component Value Date   HGBA1C 6.1 (  H) 03/07/2022  Well-controlled Had diarrhea and presyncope with metformin DC metformin, on Jardiance considering his cardiac history Advised to follow diabetic diet On statin F/u CMP and lipid panel Diabetic eye exam: Advised to follow up with Ophthalmology for diabetic eye exam  Isolated episodes of numbness and tingling of the feet could be due to diabetic neuropathy, would prefer to avoid any medicine for now      Relevant Orders   Hemoglobin A1c   CMP14+EGFR   POCT glycosylated hemoglobin (Hb A1C) (Completed)     Other   Hyperlipidemia    Last lipid profile reviewed On Lipitor currently      Relevant Orders   Lipid Profile   Nicotine dependence    Smokes about 0.5 pack/day  Asked about quitting: confirms that he/she currently smokes cigarettes Advise to quit smoking: Educated about QUITTING to reduce the risk of cancer, cardio and cerebrovascular disease. Assess willingness: Unwilling to quit at this time, but is working on cutting back. Assist with counseling and pharmacotherapy: Counseled for 5 minutes and  literature provided. Arrange for follow up: follow up in 3 months and continue to offer help.      Relevant Orders   CT CHEST LUNG CANCER SCREENING LOW DOSE WO CONTRAST   Vitamin D deficiency   Relevant Orders   VITAMIN D 25 Hydroxy (Vit-D Deficiency, Fractures)   Benign prostatic hyperplasia with nocturia    Reports nocturia, likely due to BPH Prefers to avoid medicine for now Check PSA      Relevant Orders   PSA   Other Visit Diagnoses     Refused influenza vaccine           No orders of the defined types were placed in this encounter.   Follow-up: Return in about 4 months (around 10/16/2022) for DM and neuropathy.    Lindell Spar, MD

## 2022-06-16 NOTE — Assessment & Plan Note (Signed)
BP Readings from Last 1 Encounters:  06/16/22 138/70   Well-controlled Counseled for compliance with the medications Advised DASH diet and moderate exercise/walking, at least 150 mins/week

## 2022-06-18 ENCOUNTER — Encounter: Payer: Self-pay | Admitting: Internal Medicine

## 2022-06-18 DIAGNOSIS — N401 Enlarged prostate with lower urinary tract symptoms: Secondary | ICD-10-CM | POA: Insufficient documentation

## 2022-06-18 NOTE — Assessment & Plan Note (Addendum)
Reports nocturia, likely due to BPH Prefers to avoid medicine for now Check PSA

## 2022-07-21 ENCOUNTER — Ambulatory Visit (HOSPITAL_COMMUNITY)
Admission: RE | Admit: 2022-07-21 | Discharge: 2022-07-21 | Disposition: A | Discharge status: Home / Self Care | Payer: Medicare Other | Source: Ambulatory Visit | Attending: Internal Medicine | Admitting: Internal Medicine

## 2022-07-21 DIAGNOSIS — F17218 Nicotine dependence, cigarettes, with other nicotine-induced disorders: Secondary | ICD-10-CM | POA: Diagnosis not present

## 2022-08-13 ENCOUNTER — Ambulatory Visit (INDEPENDENT_AMBULATORY_CARE_PROVIDER_SITE_OTHER): Payer: Medicare Other | Admitting: Internal Medicine

## 2022-08-13 ENCOUNTER — Other Ambulatory Visit: Payer: Self-pay | Admitting: Cardiology

## 2022-08-13 ENCOUNTER — Encounter: Payer: Self-pay | Admitting: Internal Medicine

## 2022-08-13 VITALS — BP 139/71 | HR 85 | Ht 69.0 in | Wt 182.8 lb

## 2022-08-13 DIAGNOSIS — E1169 Type 2 diabetes mellitus with other specified complication: Secondary | ICD-10-CM | POA: Diagnosis not present

## 2022-08-13 DIAGNOSIS — I5032 Chronic diastolic (congestive) heart failure: Secondary | ICD-10-CM

## 2022-08-13 DIAGNOSIS — Z0000 Encounter for general adult medical examination without abnormal findings: Secondary | ICD-10-CM | POA: Insufficient documentation

## 2022-08-13 DIAGNOSIS — I10 Essential (primary) hypertension: Secondary | ICD-10-CM

## 2022-08-13 DIAGNOSIS — J410 Simple chronic bronchitis: Secondary | ICD-10-CM

## 2022-08-13 DIAGNOSIS — I200 Unstable angina: Secondary | ICD-10-CM

## 2022-08-13 MED ORDER — BREZTRI AEROSPHERE 160-9-4.8 MCG/ACT IN AERO: 2.0000 | INHALATION_SPRAY | Freq: Two times a day (BID) | RESPIRATORY_TRACT | 11 refills | Status: DC

## 2022-08-13 NOTE — Patient Instructions (Addendum)
Please start using Breztri for COPD.  Please use Albuterol as needed for shortness of breath.

## 2022-08-13 NOTE — Assessment & Plan Note (Signed)
BP Readings from Last 1 Encounters:  08/13/22 139/71   Well-controlled Counseled for compliance with the medications Advised DASH diet and moderate exercise/walking, at least 150 mins/week

## 2022-08-13 NOTE — Assessment & Plan Note (Addendum)
S/p stent placement On aspirin and statin Was on Plavix, was discontinued due to GI bleeding Currently does not have chest pain - takes Imdur On beta-blocker, Imdur and amlodipine Followed by Cardiology

## 2022-08-13 NOTE — Assessment & Plan Note (Signed)
Lab Results  Component Value Date   HGBA1C 6.1 06/16/2022   Well-controlled Had diarrhea and presyncope with metformin DC metformin, on Jardiance considering his cardiac history Advised to follow diabetic diet On statin F/u CMP and lipid panel Diabetic eye exam: Advised to follow up with Ophthalmology for diabetic eye exam  Isolated episodes of numbness and tingling of the feet could be due to diabetic neuropathy, would prefer to avoid any medicine for now

## 2022-08-13 NOTE — Assessment & Plan Note (Signed)
Discussed about his chronic medical conditions. Discussed about home safety, nutrition and available local resources. Needs to quit smoking.

## 2022-08-13 NOTE — Assessment & Plan Note (Signed)
Has been seen by Dr. Halford Chessman Was given Symbicort, but did not help much Started Breztri as maintenance inhaler Added albuterol inhaler as needed for dyspnea/wheezing

## 2022-08-13 NOTE — Assessment & Plan Note (Signed)
Appears euvolemic currently On Jardiance Followed by cardiology

## 2022-08-13 NOTE — Progress Notes (Signed)
Subjective:   Duane Adams is a 67 y.o. male who presents for a Welcome to Medicare exam.   HPI  He reports low back pain since 08/10/22 after he had to move heavy objects from his truck. He has had constant low back pain, sharp, radiating to the left LE, but has been improving slowly. Denies any new numbness or tingling of LE.  He has chronic dyspnea and wheezing. He uses Albuterol inhaler almost on a daily basis. He has tried Symbicort, but did not help much. He denies any fever, chills, hemoptysis or night sweats.   Vision:Not within last year , Dental: No current dental problems and Last dental visit: 07/23, and PSA: Prostate cancer screening and PSA options (with potential risks and benefits of testing vs. not testing) were discussed along with recent recs/guidelines.     Review of Systems: onstitutional:  Positive for fatigue. Negative for chills and fever.  HENT:  Negative for congestion and sore throat.   Eyes:  Negative for pain and discharge.  Respiratory:  Positive for shortness of breath. Negative for cough.   Cardiovascular:  Negative for chest pain and palpitations.  Gastrointestinal:  Negative for diarrhea, nausea and vomiting.  Endocrine: Negative for polydipsia and polyuria.  Genitourinary:  Negative for dysuria and hematuria.  Musculoskeletal:  Positive for arthralgias and back pain. Negative for neck pain and neck stiffness.  Skin:  Negative for rash.  Neurological:  Negative for weakness and headaches.  Psychiatric/Behavioral:  Negative for agitation and behavioral problems.         Objective:    Today's Vitals   08/13/22 1409  BP: 139/71  Pulse: 85  SpO2: 96%  Weight: 182 lb 12.8 oz (82.9 kg)  Height: '5\' 9"'$  (1.753 m)   Body mass index is 26.99 kg/m.  Medications Outpatient Encounter Medications as of 08/13/2022  Medication Sig   Budeson-Glycopyrrol-Formoterol (BREZTRI AEROSPHERE) 160-9-4.8 MCG/ACT AERO Inhale 2 puffs into the lungs 2  (two) times daily.   albuterol (VENTOLIN HFA) 108 (90 Base) MCG/ACT inhaler Inhale 2 puffs into the lungs every 6 (six) hours as needed for wheezing or shortness of breath.   amLODipine (NORVASC) 10 MG tablet TAKE 1 TABLET BY MOUTH ONCE A DAY.   aspirin EC 81 MG tablet Take 81 mg by mouth at bedtime.    atorvastatin (LIPITOR) 80 MG tablet TAKE ONE TABLET BY MOUTH ONCE DAILY.   chlorthalidone (HYGROTON) 25 MG tablet TAKE 1 TABLET BY MOUTH DAILY.   Cholecalciferol (VITAMIN D3) 2000 units TABS Take 4,000 Units by mouth 2 (two) times daily.   cloNIDine (CATAPRES) 0.1 MG tablet TAKE (1) TABLET BY MOUTH TWICE DAILY.   empagliflozin (JARDIANCE) 10 MG TABS tablet Take 1 tablet (10 mg total) by mouth daily before breakfast.   isosorbide mononitrate (IMDUR) 30 MG 24 hr tablet TAKE 1 TABLET BY MOUTH ONCE DAILY.   metoprolol succinate (TOPROL-XL) 100 MG 24 hr tablet TAKE ONE TABLET BY MOUTH ONCE DAILY WITH '50MG'$ . TOTAL OF '150MG'$  DAILY.   metoprolol succinate (TOPROL-XL) 50 MG 24 hr tablet TAKE 1 TABLET BY MOUTH WITH OR IMMEDIATELY FOLLOWING A MEAL ONCE A DAY.   Multiple Vitamin (MULTIVITAMIN WITH MINERALS) TABS tablet Take 1 tablet by mouth daily.   nitroGLYCERIN (NITROSTAT) 0.4 MG SL tablet Place 1 tablet (0.4 mg total) under the tongue every 5 (five) minutes as needed for chest pain.   Omega 3 1200 MG CAPS Take 2,400 mg by mouth daily.   potassium chloride  SA (KLOR-CON M) 20 MEQ tablet TAKE ONE TABLET BY MOUTH ONCE DAILY.   [DISCONTINUED] budesonide-formoterol (SYMBICORT) 160-4.5 MCG/ACT inhaler Inhale 2 puffs into the lungs 2 (two) times daily. (Patient not taking: Reported on 02/20/2022)   [DISCONTINUED] pantoprazole (PROTONIX) 20 MG tablet TAKE ONE TABLET BY MOUTH ONCE DAILY. (Patient not taking: Reported on 02/20/2022)   [DISCONTINUED] potassium chloride SA (KLOR-CON M) 20 MEQ tablet TAKE ONE TABLET BY MOUTH ONCE DAILY.   No facility-administered encounter medications on file as of 08/13/2022.      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   Concussion several yrs ago   no residual   Coronary artery disease    a. 03/2013 Cath/PCI: LM nl, LAD 70m(2.5x20 Promus Premier DES), LCX min irregs, RCA dom, 90p(3.0x20 Promus Premier DES), EF 55-65%. b. patent stents by caths in 08/2016 and 01/2021   [place]re[place]   slight memory issues   Essential hypertension, benign    GERD (gastroesophageal reflux disease)    Helicobacter pylori gastritis JUN 2016 EGD/Bx   PREVPAK BID FOR 14 DAYS   Hepatitis    Hepatitis C treated  2011   Hiatal hernia    History of kidney stones    Mixed hyperlipidemia    Neuroma    Prediabetes 06/23/2019   Skin cancer    Sub[place]    alcoholic quit [id number]  Tobacco abuse    Past Surgical History:  Procedure Laterality Date   ABDOMINAL AORTOGRAM W/LOWER EXTREMITY N/A 06/15/2017   Procedure: ABDOMINAL AORTOGRAM W/LOWER EXTREMITY;  Surgeon: DAngelia Mould MD;  [facility];  Service: Cardiovascular;  Laterality: N/A;  Bilateral   BIOPSY N/A 12/19/2014   Procedure: BIOPSY;  Surgeon: SDanie Binder MD;  Location: [facility];  Service: Endoscopy;  Laterality: N/A;   COLONOSCOPY  12/2014   [facility]: with endoscopic mucosal resection. 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 Needs colonoscopy in 1 year.    COLONOSCOPY WITH PROPOFOL N/A 12/19/2014   Dr. FOneida Alar six simple adenomas and 3 hyperplastic polyps. [facility]trans[facility]   COLONOSCOPY WITH PROPOFOL N/A 04/01/2021   Surgeon: CEloise Harman DO;Nonbleeding internal hemorrhoids, diverticulosis in the sigmoid colon, tattoo in transverse colon with tattoo site appearing normal.  Recommended repeat colonoscopy in 5 years.    CORONARY ANGIOPLASTY WITH STENT PLACEMENT  04/11/2013   LAD &  RCA     DR COOPER   ESOPHAGOGASTRODUODENOSCOPY (EGD) WITH PROPOFOL N/A 12/19/2014   Dr. FOneida Alar  without varices, small hiatal hernia noted, moderate non-erosive gastritis and mild duodenitis.  POSITIVE H.PYLORI. Prescribed Prevpac.   EXCISION MORTON'S NEUROMA Left 08/15/2020   Procedure: EXCISION MORTON'S NEUROMA THIRD INTERSPACE LEFT FOOT;  Surgeon: PFelipa Furnace DPM;  [facility]  Service: Podiatry;  Laterality: Left;   EXTRACORPOREAL SHOCK WAVE LITHOTRIPSY  2001   FLEXIBLE SIGMOIDOSCOPY N/A 12/23/2017   Procedure: FLEXIBLE SIGMOIDOSCOPY;  Surgeon: FDanie Binder MD;  [facility];  Service: Endoscopy;  Laterality: N/A;  1:45pm   HAND RECONSTRUCTION Left    HEMORRHOID BANDING N/A 12/23/2017   Procedure: HEMORRHOID BANDING;  Surgeon: FDanie Binder MD;  [facility];  Service: Endoscopy;  Laterality: N/A;  LEFT HEART CATH AND CORONARY ANGIOGRAPHY N/A 01/22/2021   Procedure: LEFT HEART CATH AND CORONARY ANGIOGRAPHY;  Surgeon: Martinique, Peter M, MD;  Location: [facility];  Service: Cardiovascular;  Laterality: N/A;   LEFT HEART CATHETERIZATION WITH CORONARY ANGIOGRAM N/A 04/11/2013   Procedure: LEFT HEART CATHETERIZATION WITH CORONARY ANGIOGRAM;  Surgeon: Blane Ohara, MD;  Location[facility] C[facility];  Service: Cardiovascular;  Laterality: N/A;   LEG TENDON SURGERY Right    LIPOMA EXCISION Left 09/13/2021   Procedure: EXCISION LIPOMA; SHOULDER, 4CM;  Surgeon: Rusty Aus, DO;  Location: [facility];  Service: General;  Laterality: Left;   MASS EXCISION N/A 09/13/2021   Procedure: EXCISION MASS; BACK, 3CM;  Surgeon: Rusty Aus, DO;  Location: [facility];  Service: General;  Laterality: N/A;   POLYPECTOMY N/A 12/19/2014   Procedure: POLYPECTOMY;  Surgeon: Danie Binder, MD;  Location: [facility];  Service: Endoscopy;  Laterality: N/A;    Family History  Problem  Relation Age of Onset   Stroke Mother    Aneurysm Mother    Hypertension Mother    Heart disease Father    Hypertension Father    Heart disease Brother    Hypertension Brother    Alcohol abuse Brother    Diabetes Paternal Aunt    Colon cancer Neg Hx    Social History   Occupational History   Occupation: disabled    Comment: heart  Tobacco Use   Smoking status: Every Day    Packs/day: 0.50    Years: 50.00    Total pack years: 25.00    Types: Cigarettes    Start date: 07/14/1968   Smokeless tobacco: Never   Tobacco comments:    5-6 Cigarettes per day  Vaping Use   Vaping Use: Never used  Substance and Sexual Activity   Alcohol use: Yes    Comment: 2-3 beer a couple times a year (10/21/21) Previously 5-6 beer every 2 weeks.   Drug use: Not Currently    Types: Cocaine, Marijuana    Comment: cocaine marijuana and acid last used 1984   Sexual activity: Not Currently   Tobacco Counseling Ready to quit: Not Answered Counseling given: Not Answered Tobacco comments: 5-6 Cigarettes per day   Immunizations and Health Maintenance Immunization History  Administered Date(s) Administered   Fluad Quad(high Dose 65+) 06/23/2019   Influenza,inj,Quad PF,6+ Mos 05/05/2017   Pneumococcal Conjugate-13 05/05/2017   Tdap 06/08/2017   Health Maintenance Due  Topic Date Due   COVID-19 Vaccine (1) Never done   OPHTHALMOLOGY EXAM  Never done    Activities of Daily Living    09/10/2021   11:01 AM  In your present state of health, do you have any difficulty performing the following activities:  Hearing? 0  Vision? 0  Difficulty concentrating or making decisions? 0  Dressing or bathing? 0    Physical Exam Vitals reviewed.  Constitutional:      General: He is not in acute distress.    Appearance: He is not diaphoretic.  HENT:     Head: Normocephalic and atraumatic.     Nose: Nose normal.     Mouth/Throat:     Mouth: Mucous membranes are moist.  Eyes:     General: No scleral  icterus.    Extraocular Movements: Extraocular movements intact.  Cardiovascular:     Rate and Rhythm: Normal rate and regular rhythm.     Pulses: Normal pulses.     Heart sounds: Normal heart sounds. No  murmur heard. Pulmonary:     Breath sounds: Normal breath sounds. Mild wheezing b/l. No rales.  Musculoskeletal:     Cervical back: Neck supple. No tenderness.     Right lower leg: No edema.     Left lower leg: No edema.  Skin:    General: Skin is warm.     Findings: No rash.  Neurological:     General: No focal deficit present.     Mental Status: He is alert and oriented to person, place, and time.     Sensory: No sensory deficit.     Motor: No weakness.  Psychiatric:        Mood and Affect: Mood normal.        Behavior: Behavior normal.   Advanced Directives: No, discussed.      Assessment:    This is a routine wellness  examination for this patient .   Vision/Hearing screen No results found.  Dietary issues and exercise activities discussed:      Goals      Quit Smoking     Quit smoking / using tobacco        Depression Screen    08/13/2022    2:11 PM 06/16/2022    1:42 PM 02/11/2022    1:09 PM 08/14/2021   12:58 PM  PHQ 2/9 Scores  PHQ - 2 Score 2 2 0 0  PHQ- 9 Score 9 9       Fall Risk    08/13/2022    2:09 PM  Fall Risk   Falls in the past year? 0  Number falls in past yr: 0  Injury with Fall? 0    Cognitive Function    08/13/2022    2:34 PM  MMSE - Mini Mental State Exam  Orientation to time 5  Orientation to Place 5  Registration 3  Attention/ Calculation 5  Recall 2  Language- name 2 objects 2  Language- repeat 1  Language- follow 3 step command 3  Language- read & follow direction 1  Write a sentence 1  Copy design 1  Total score 29        Patient Care Team: Lindell Spar, MD as PCP - General (Internal Medicine) Harl Bowie, Alphonse Guild, MD as PCP - Cardiology (Cardiology) Eloise Harman, DO as Consulting Physician  (Gastroenterology)     Plan:   Encounter for Medicare annual wellness exam Discussed about his chronic medical conditions. Discussed about home safety, nutrition and available local resources. Needs to quit smoking.  Essential hypertension BP Readings from Last 1 Encounters:  08/13/22 139/71   Well-controlled Counseled for compliance with the medications Advised DASH diet and moderate exercise/walking, at least 150 mins/week  Unstable angina (HCC) S/p stent placement On aspirin and statin Was on Plavix, was discontinued due to GI bleeding Currently does not have chest pain - takes Imdur On beta-blocker, Imdur and amlodipine Followed by Cardiology  Chronic heart failure with preserved ejection fraction ([place]) Appears euvolemic currently On Jardiance Followed by cardiology  Simple chronic bronchitis ([place]) Has been seen by Dr. Halford Chessman Was given Symbicort, but did not help much Started Breztri as maintenance inhaler Added albuterol inhaler as needed for dyspnea/wheezing  Diabetes mellitus ([place]) Lab Results  Component Value Date   HGBA1C 6.1 06/16/2022   Well-controlled Had diarrhea and presyncope with metformin DC metformin, on Jardiance considering his cardiac history Advised to follow diabetic diet On statin F/u CMP and lipid panel Diabetic eye exam: Advised to follow up  with Ophthalmology for diabetic eye exam  Isolated episodes of numbness and tingling of the feet could be due to diabetic neuropathy, would prefer to avoid any medicine for now    I have personally reviewed and noted the following in the patient's chart:   Medical and social history Use of alcohol, tobacco or illicit drugs  Current medications and supplements Functional ability and status Nutritional status Physical activity Advanced directives List of other physicians Hospitalizations, surgeries, and ER visits in previous 12 months Vitals Screenings to include cognitive, depression, and  falls Referrals and appointments  In addition, I have reviewed and discussed with patient certain preventive protocols, quality metrics, and best practice recommendations. A written personalized care plan for preventive services as well as general preventive health recommendations were provided to patient.    Lindell Spar, MD 08/13/2022

## 2022-08-27 ENCOUNTER — Ambulatory Visit: Payer: Medicare Other | Attending: Cardiology | Admitting: Cardiology

## 2022-08-27 ENCOUNTER — Encounter: Payer: Self-pay | Admitting: Cardiology

## 2022-08-27 VITALS — BP 120/60 | HR 80 | Ht 69.0 in | Wt 178.0 lb

## 2022-08-27 DIAGNOSIS — E785 Hyperlipidemia, unspecified: Secondary | ICD-10-CM

## 2022-08-27 DIAGNOSIS — I10 Essential (primary) hypertension: Secondary | ICD-10-CM | POA: Diagnosis present

## 2022-08-27 DIAGNOSIS — I25118 Atherosclerotic heart disease of native coronary artery with other forms of angina pectoris: Secondary | ICD-10-CM | POA: Diagnosis present

## 2022-08-27 DIAGNOSIS — I200 Unstable angina: Secondary | ICD-10-CM | POA: Diagnosis not present

## 2022-08-27 NOTE — Patient Instructions (Signed)
Medication Instructions:  Your physician recommends that you continue on your current medications as directed. Please refer to the Current Medication list given to you today.   Labwork: None  Testing/Procedures: None  Follow-Up: Follow up with Dr. Branch in 6 months.   Any Other Special Instructions Will Be Listed Below (If Applicable).     If you need a refill on your cardiac medications before your next appointment, please call your pharmacy.  

## 2022-08-27 NOTE — Progress Notes (Signed)
Clinical Summary Mr. Werthmann is a 67 y.o.male seen today for follow up of the following medical problems.This is a focused visit on history of CAD and recent chest pains.      1. CAD   -  cath 04/11/13 showed significant LAD and RCA disease, s/p DES to both  - echo 03/2013 with normal LVEF   - compliant w/ meds including ASA and plavix   - seen at [facility] for chest pain 08/2016. LHC at that time showed only mild CAD along with patent stents.   - previously lowered imdur to 70m daily due to headaches, increased Toprol to 755mdaily. Has had some increased fatigue, poor sleep. No recent chest pain     - occasional chest pains midchest, Dull pain, flipping kind of filling. Lasts just a few seconds.   - compliant with meds    01/2021 reported progressive chest pain and DOE - 01/2021 cath: LM normal, LAD patent stent and mid 25%, D1 35%, LCX 40%, ostial RCA 10% and prox 30% -55-65%    - no recent chest pain. No SOB/DOE - compliant with meds       2. Hyperlipidemia   - bad reaction to crestor, on only low dose atorvastatin 10 mg.  - 09/2016 TC 176 TG 204 HDL 46 LDL 89 - most recent labs with pcp   - he is on atorva 4011maily. LDL 100. TGs were 443, taking fish oil - side effects on higher dose statins in the past      08/2021 TC 163 TG 262 HDL 45 LDL 75   3. HTN   - admit July 2020 with severe HTN, chest pain, stroke like symptoms - home bp's usually 120-160s/80s.  - ACEI alllergy with neck swelling, has not been on ARB due to cross reactivity risk.     - compliant with meds   4. History of GI bleed - followed by GI, from notes thought to be due to internal hemorroids.    5. PAD - followed by vascular - aniography 06/2017 without significant disease.  - left subclavian stenosis. 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   Concussion several yrs  ago   no residual   Coronary artery disease    a. 03/2013 Cath/PCI: LM nl, LAD 12m45m5x20 Promus Premier DES), LCX min irregs, RCA dom, 90p(3.0x20 Promus Premier DES), EF 55-65%. b. patent stents by caths in 08/2016 and 01/2021   CVA (cerebral vascular accident) (HCC)[place]/20/2020   slight memory issues   Essential hypertension, benign    GERD (gastroesophageal reflux disease)    Helicobacter pylori gastritis JUN 2016 EGD/Bx   PREVPAK BID FOR 14 DAYS   Hepatitis    Hepatitis C treated  2011   Hiatal hernia    History of kidney stones    Mixed hyperlipidemia    Neuroma    Prediabetes 06/23/2019   Skin cancer    Substance abuse (HCC)Rose alcoholic quit [id number] abuse      Allergies  Allergen Reactions   Lisinopril Swelling    Swelling neck   Crestor [Rosuvastatin] Other (See Comments)    Severe muscle aches   Chantix [Varenicline] Other (See Comments)    Abdominal pain   Neosporin [Neomycin-Bacitracin Zn-Polymyx] Rash    blistered  Current Outpatient Medications  Medication Sig Dispense Refill   albuterol (VENTOLIN HFA) 108 (90 Base) MCG/ACT inhaler Inhale 2 puffs into the lungs every 6 (six) hours as needed for wheezing or shortness of breath. 8 g 5   amLODipine (NORVASC) 10 MG tablet TAKE 1 TABLET BY MOUTH ONCE A DAY. 90 tablet 1   aspirin EC 81 MG tablet Take 81 mg by mouth at bedtime.      atorvastatin (LIPITOR) 80 MG tablet TAKE ONE TABLET BY MOUTH ONCE DAILY. 90 tablet 3   Budeson-Glycopyrrol-Formoterol (BREZTRI AEROSPHERE) 160-9-4.8 MCG/ACT AERO Inhale 2 puffs into the lungs 2 (two) times daily. 10.7 g 11   chlorthalidone (HYGROTON) 25 MG tablet TAKE 1 TABLET BY MOUTH DAILY. 90 tablet 1   Cholecalciferol (VITAMIN D3) 2000 units TABS Take 4,000 Units by mouth 2 (two) times daily.     cloNIDine (CATAPRES) 0.1 MG tablet TAKE (1) TABLET BY MOUTH TWICE DAILY. 180 tablet 3   empagliflozin (JARDIANCE) 10 MG TABS tablet Take 1 tablet (10 mg total) by mouth daily  before breakfast. 90 tablet 3   isosorbide mononitrate (IMDUR) 30 MG 24 hr tablet TAKE 1 TABLET BY MOUTH ONCE DAILY. 90 tablet 1   metoprolol succinate (TOPROL-XL) 100 MG 24 hr tablet TAKE ONE TABLET BY MOUTH ONCE DAILY WITH 50MG. TOTAL OF 150MG DAILY. 90 tablet 3   metoprolol succinate (TOPROL-XL) 50 MG 24 hr tablet TAKE 1 TABLET BY MOUTH WITH OR IMMEDIATELY FOLLOWING A MEAL ONCE A DAY. 90 tablet 1   Multiple Vitamin (MULTIVITAMIN WITH MINERALS) TABS tablet Take 1 tablet by mouth daily.     nitroGLYCERIN (NITROSTAT) 0.4 MG SL tablet Place 1 tablet (0.4 mg total) under the tongue every 5 (five) minutes as needed for chest pain. 25 tablet 3   Omega 3 1200 MG CAPS Take 2,400 mg by mouth daily.     potassium chloride SA (KLOR-CON M) 20 MEQ tablet TAKE ONE TABLET BY MOUTH ONCE DAILY. 90 tablet 0   No current facility-administered medications for this visit.     Past Surgical History:  Procedure Laterality Date   ABDOMINAL AORTOGRAM W/LOWER EXTREMITY N/A 06/15/2017   Procedure: ABDOMINAL AORTOGRAM W/LOWER EXTREMITY;  Surgeon: Angelia Mould, MD;  Location: [place] [facility];  Service: Cardiovascular;  Laterality: N/A;  Bilateral   BIOPSY N/A 12/19/2014   Procedure: BIOPSY;  Surgeon: Danie Binder, MD;  Location: [facility];  Service: Endoscopy;  Laterality: N/A;   COLONOSCOPY  12/2014   [facility]: with endoscopic mucosal resection. 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 Needs colonoscopy in 1 year.    COLONOSCOPY WITH PROPOFOL N/A 12/19/2014   Dr. Oneida Alar: six simple adenomas and 3 hyperplastic polyps. Had flat mid-transverse colon polyp and referred to [facility] for endoscopic mucosal resection, which is scheduled for July 22   COLONOSCOPY WITH PROPOFOL N/A 04/01/2021   Surgeon: Eloise Harman, DO;Nonbleeding internal hemorrhoids, diverticulosis in the sigmoid colon, tattoo in transverse colon with tattoo site appearing normal.  Recommended repeat colonoscopy in 5 years.    CORONARY ANGIOPLASTY WITH STENT PLACEMENT  04/11/2013   LAD &  RCA     DR COOPER   ESOPHAGOGASTRODUODENOSCOPY (EGD) WITH PROPOFOL N/A 12/19/2014   Dr. Oneida Alar:  without varices, small hiatal hernia noted, moderate non-erosive gastritis and mild duodenitis.  POSITIVE H.PYLORI. Prescribed Prevpac.   EXCISION MORTON'S NEUROMA Left 08/15/2020   Procedure: EXCISION MORTON'S NEUROMA THIRD INTERSPACE LEFT FOOT;  Surgeon: [name], DPM;  Location: [facility]  [place];  Service: Podiatry;  Laterality: Left;   EXTRACORPOREAL SHOCK WAVE LITHOTRIPSY  2001   FLEXIBLE SIGMOIDOSCOPY N/A 12/23/2017   Procedure: FLEXIBLE SIGMOIDOSCOPY;  Surgeon: Danie Binder, MD;  [facility];  Service: Endoscopy;  Laterality: N/A;  1:45pm   HAND RECONSTRUCTION Left    HEMORRHOID BANDING N/A 12/23/2017   Procedure: HEMORRHOID BANDING;  Surgeon: Danie Binder, MD;  [facility];  Service: Endoscopy;  Laterality: N/A;   LEFT HEART CATH AND CORONARY ANGIOGRAPHY N/A 01/22/2021   Procedure: LEFT HEART CATH AND CORONARY ANGIOGRAPHY;  Surgeon: Martinique, Peter M, MD;  Location: [place] [facility];  Service: Cardiovascular;  Laterality: N/A;   LEFT HEART CATHETERIZATION WITH CORONARY ANGIOGRAM N/A 04/11/2013   Procedure: LEFT HEART CATHETERIZATION WITH CORONARY ANGIOGRAM;  Surgeon: Blane Ohara, MD;  Location: [facility];  Service: Cardiovascular;  Laterality: N/A;   LEG TENDON SURGERY Right    LIPOMA EXCISION Left 09/13/2021   Procedure: EXCISION LIPOMA; SHOULDER, 4CM;  Surgeon: Rusty Aus, DO;  Location: [facility];  Service: General;  Laterality: Left;   MASS EXCISION N/A 09/13/2021   Procedure: EXCISION MASS; BACK, 3CM;  Surgeon: Rusty Aus, DO;  Location: [facility];  Service: General;  Laterality: N/A;   POLYPECTOMY N/A 12/19/2014   Procedure: POLYPECTOMY;  Surgeon: Danie Binder, MD;  Location: [facility];  Service: Endoscopy;  Laterality: N/A;     Allergies  Allergen  Reactions   Lisinopril Swelling    Swelling neck   Crestor [Rosuvastatin] Other (See Comments)    Severe muscle aches   Chantix [Varenicline] Other (See Comments)    Abdominal pain   Neosporin [Neomycin-Bacitracin Zn-Polymyx] Rash    blistered      Family History  Problem Relation Age of Onset   Stroke Mother    Aneurysm Mother    Hypertension Mother    Heart disease Father    Hypertension Father    Heart disease Brother    Hypertension Brother    Alcohol abuse Brother    Diabetes Paternal Aunt    Colon cancer Neg Hx      Social History Mr. Tremain reports that he has been smoking cigarettes. He started smoking about 54 years ago. He has a 25.00 pack-year smoking history. He has never used smokeless tobacco. Mr. Schnack reports current alcohol use.   Review of Systems CONSTITUTIONAL: No weight loss, fever, chills, weakness or fatigue.  HEENT: Eyes: No visual loss, blurred vision, double vision or yellow sclerae.No hearing loss, sneezing, congestion, runny nose or sore throat.  SKIN: No rash or itching.  CARDIOVASCULAR: per hpi RESPIRATORY: No shortness of breath, cough or sputum.  GASTROINTESTINAL: No anorexia, nausea, vomiting or diarrhea. No abdominal pain or blood.  GENITOURINARY: No burning on urination, no polyuria NEUROLOGICAL: No headache, dizziness, syncope, paralysis, ataxia, numbness or tingling in the extremities. No change in bowel or bladder control.  MUSCULOSKELETAL: No muscle, back pain, joint pain or stiffness.  LYMPHATICS: No enlarged nodes. No history of splenectomy.  PSYCHIATRIC: No history of depression or anxiety.  ENDOCRINOLOGIC: No reports of sweating, cold or heat intolerance. No polyuria or polydipsia.  Marland Kitchen   Physical Examination Today's Vitals   08/27/22 1428  BP: 120/60  Pulse: 80  SpO2: 97%  Weight: 178 lb (80.7 kg)  Height: 5' 9"$  (1.753 m)   Body mass index is 26.29 kg/m.  Gen: resting comfortably, no acute distress HEENT: no  scleral icterus, pupils equal round and  reactive, no palptable cervical adenopathy,  CV: RRR, no m/r/g, no jvd Resp: Clear to auscultation bilaterally GI: abdomen is soft, non-tender, non-distended, normal bowel sounds, no hepatosplenomegaly MSK: extremities are warm, no edema.  Skin: warm, no rash Neuro:  no focal deficits Psych: appropriate affect   Diagnostic Studies  04/11/13 Cath   PROCEDURAL FINDINGS   Hemodynamics:   AO 178/93   LV 177/18   Coronary angiography:   Coronary dominance: right   Left mainstem: Widely patent without obstructive disease   Left anterior descending (LAD): severe mid-vessel stenosis (80%) between the first and second septal perforators. The vessel reaches the LV apex and there is no other significant disease noted. Large D1 is patent.   Left circumflex (LCx): small vessel, supplies 2 OM branches. Mild irregularity noted.   Right coronary artery (RCA): large, dominant vessel. There is severe 90% proximal stenosis. Irregularity in the mid-vessel without significant stenosis. The PDA and PLA branches are both large without significant disease.   Left ventriculography: Left ventricular systolic function is normal, LVEF is estimated at 55-65%, there is no significant mitral regurgitation   PCI Note: Following the diagnostic procedure, the decision was made to proceed with PCI. The patient was loaded with plavix 600 mg. Weight-based bivalirudin was given for anticoagulation. I planned on treating the LAD and RCA, both of which have high-grade disease. Once a therapeutic ACT was achieved, a 6 [place] XB-LAD guide catheter was inserted. A cougar coronary guidewire was used to cross the lesion. The lesion was predilated with a 2.0 mm balloon. The lesion was then stented with a 2.5x20 mm Promus Premier drug-eluting stent. The stent was postdilated with a 2.75 mm noncompliant balloon. Following PCI, there was 0% residual stenosis and TIMI-3 flow. Attention was then turned  to the RCA. A JR-4 guide was used. The same cougar guidewire was used to cross the lesion. The lesion was dilated with a 2.0 mm balloon and stented with a 3.0x20 mm Promus Premier DES. The stent was post-dilated to 18 atm with a 3.25 mm [place] balloon. Final angiography confirmed an excellent result. The patient tolerated the procedure well. There were no immediate procedural complications. A TR band was used for radial hemostasis. The patient was transferred to the post catheterization recovery area for further monitoring.   PCI Data:   Lesion 1   Vessel - LAD/Segment - mid   Percent Stenosis (pre) 80   TIMI-flow 3   Stent 2.5x20 mm Promus Premier DES   Percent Stenosis (post) 0   TIMI-flow (post) 3   Lesion 2   Vessel - RCA/Segment - prox   Percent Stenosis (pre) 90   TIMI-flow 3   Stent 3.0x20 mm Promus Premier DES   Percent Stenosis (post) 0   TIMI-flow (post) 3   Final Conclusions:   Severe 2 vessel CAD with successful PCI of the LAD and RCA   Normal LV function   Recommendations:   ASA and plavix for at least 12 months.     04/04/13 Echo   LVEF 60-65%, no WMA, grade I diastolic dysfunction       10/2014 Exercise Nuclear Stress IMPRESSION: 1. No reversible ischemia or infarction. Submaximal stress test, thus diminishing the ability of this test to detect ischemia.   2. Normal left ventricular wall motion.   3. Left ventricular ejection fraction 78%   4. Low-risk stress test findings*.  Consider Lexiscan in the future.     08/2016 [facility] cath Angiography: LVgram: not performed LCA:  LAD stent patent, mild luminal irregularites, no significant obstructive disease RCA: proximal stent patent, large R dominant RCA,mild luminal irregularities in distal Morganna Styles  Impression:   Mild CAD with Patent Stents RCA and LAD Normal resting LVedp (14 mm)  Plan:  Follow-up with referring physician. Echocardiogram suggested to evaluate possible HFrEF as cause for symptoms   Assessment and  Plan   1. CAD with stable angina - most recent cath showed no significant disease - prior angina has resolved with medical therapy, continue current meds   2. Hyperlipidemia - overall at goal, continue current meds   3.HTN - at goal, continue current meds    Arnoldo Lenis, M.D.

## 2022-09-08 ENCOUNTER — Emergency Department (HOSPITAL_COMMUNITY): Payer: Medicare Other

## 2022-09-08 ENCOUNTER — Emergency Department (HOSPITAL_COMMUNITY)
Admission: EM | Admit: 2022-09-08 | Discharge: 2022-09-08 | Disposition: A | Discharge status: Home / Self Care | Payer: Medicare Other | Attending: Emergency Medicine | Admitting: Emergency Medicine

## 2022-09-08 ENCOUNTER — Encounter (HOSPITAL_COMMUNITY): Payer: Self-pay | Admitting: Emergency Medicine

## 2022-09-08 DIAGNOSIS — Y9301 Activity, walking, marching and hiking: Secondary | ICD-10-CM | POA: Diagnosis not present

## 2022-09-08 DIAGNOSIS — R519 Headache, unspecified: Secondary | ICD-10-CM | POA: Insufficient documentation

## 2022-09-08 DIAGNOSIS — M25511 Pain in right shoulder: Secondary | ICD-10-CM | POA: Insufficient documentation

## 2022-09-08 DIAGNOSIS — M25512 Pain in left shoulder: Secondary | ICD-10-CM | POA: Diagnosis not present

## 2022-09-08 DIAGNOSIS — M542 Cervicalgia: Secondary | ICD-10-CM | POA: Diagnosis not present

## 2022-09-08 DIAGNOSIS — W01198A Fall on same level from slipping, tripping and stumbling with subsequent striking against other object, initial encounter: Secondary | ICD-10-CM | POA: Insufficient documentation

## 2022-09-08 DIAGNOSIS — Z79899 Other long term (current) drug therapy: Secondary | ICD-10-CM | POA: Diagnosis not present

## 2022-09-08 DIAGNOSIS — Z7982 Long term (current) use of aspirin: Secondary | ICD-10-CM | POA: Diagnosis not present

## 2022-09-08 DIAGNOSIS — I10 Essential (primary) hypertension: Secondary | ICD-10-CM | POA: Insufficient documentation

## 2022-09-08 DIAGNOSIS — W19XXXA Unspecified fall, initial encounter: Secondary | ICD-10-CM

## 2022-09-08 DIAGNOSIS — I2510 Atherosclerotic heart disease of native coronary artery without angina pectoris: Secondary | ICD-10-CM | POA: Insufficient documentation

## 2022-09-08 DIAGNOSIS — Z85828 Personal history of other malignant neoplasm of skin: Secondary | ICD-10-CM | POA: Insufficient documentation

## 2022-09-08 MED ORDER — CYCLOBENZAPRINE HCL 10 MG PO TABS: 10.0000 mg | ORAL_TABLET | Freq: Two times a day (BID) | ORAL | 0 refills | Status: DC | PRN

## 2022-09-08 NOTE — ED Notes (Signed)
Went in pt's room to review discharge papers, pt no longer in the room.

## 2022-09-08 NOTE — Discharge Instructions (Signed)
Your CT and x-ray were reassuring today.  Take tylenol/ibuprofen and flexeril for pain. I recommend close follow-up with PCP for reevaluation.  Please do not hesitate to return to emergency department if worrisome signs symptoms we discussed become apparent.

## 2022-09-08 NOTE — ED Provider Notes (Signed)
[facility] Provider Note   CSN: [id number] Arrival date & time: 09/08/22  1056     History  Chief Complaint  Patient presents with   Duane Adams is a 67 y.o. male with a past medical history of cirrhosis, CAD, CVA, GERD, hypertension presenting today for evaluation after fall.  Patient reports he was walking down the ramp, slipped and fell backward landed on his shoulders and hit his head.  He reports 2 to 3 seconds of loss of consciousness.  He reports laying down on the ground for about 5 minutes before getting up.  Patient complains of headache, neck pain, bilateral shoulder pain and clavicle pain pain.  Denies fever, nausea, vomiting, chest pain, shortness of breath.  Patient denies taking any blood thinners.   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   Concussion several yrs ago   no residual   Coronary artery disease    a. 03/2013 Cath/PCI: LM nl, LAD 80m(2.5x20 Promus Premier DES), LCX min irregs, RCA dom, 90p(3.0x20 Promus Premier DES), EF 55-65%. b. 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 Needs colonoscopy in 1 year.    COLONOSCOPY WITH PROPOFOL N/A 12/19/2014   Dr. FOneida Alar six simple adenomas and 3 hyperplastic polyps. Had flat mid-transverse colon polyp [facility] which [facility] for [facility]   COLONOSCOPY WITH PROPOFOL N/A 04/01/2021   Surgeon: CEloise Harman DO;Nonbleeding internal hemorrhoids, diverticulosis in the sigmoid colon, tattoo in transverse colon with tattoo site appearing normal.  Recommended repeat colonoscopy in 5 years.   CORONARY ANGIOPLASTY WITH STENT PLACEMENT  04/11/2013   LAD &  RCA     DR COOPER   ESOPHAGOGASTRODUODENOSCOPY (EGD) WITH PROPOFOL N/A 12/19/2014   Dr. FOneida Alar  without varices, small hiatal hernia noted, moderate non-erosive gastritis and mild duodenitis.  POSITIVE H.PYLORI. Prescribed Prevpac.   EXCISION MORTON'S NEUROMA Left 08/15/2020   Procedure: EXCISION MORTON'S NEUROMA THIRD INTERSPACE LEFT FOOT;  Surgeon: PFelipa Furnace DPM;  [facility]  Service: Podiatry;  Laterality: Left;   EXTRACORPOREAL SHOCK WAVE LITHOTRIPSY  2001   FLEXIBLE SIGMOIDOSCOPY N/A 12/23/2017   Procedure: FLEXIBLE SIGMOIDOSCOPY;  Surgeon: Danie Binder, MD;  [facility];  Service: Endoscopy;  Laterality: N/A;  1:45pm   HAND RECONSTRUCTION Left    HEMORRHOID BANDING N/A 12/23/2017   Procedure: HEMORRHOID BANDING;  Surgeon: Danie Binder, MD;  [facility];  Service: Endoscopy;  Laterality: N/A;   LEFT HEART CATH AND CORONARY ANGIOGRAPHY N/A 01/22/2021   Procedure: LEFT HEART CATH AND CORONARY ANGIOGRAPHY;  Surgeon: Martinique, Peter M, MD;  Location: [facility];  Service: Cardiovascular;   Laterality: N/A;   LEFT HEART CATHETERIZATION WITH CORONARY ANGIOGRAM N/A 04/11/2013   Procedure: LEFT HEART CATHETERIZATION WITH CORONARY ANGIOGRAM;  Surgeon: Blane Ohara, MD;  Location: [facility];  Service: Cardiovascular;  Laterality: N/A;   LEG TENDON SURGERY Right    LIPOMA EXCISION Left 09/13/2021   Procedure: EXCISION LIPOMA; SHOULDER, 4CM;  Surgeon: Rusty Aus, DO;  Location: [facility];  Service: General;  Laterality: Left;   MASS EXCISION N/A 09/13/2021   Procedure: EXCISION MASS; BACK, 3CM;  Surgeon: Rusty Aus, DO;  Location: [facility];  Service: General;  Laterality: N/A;   POLYPECTOMY N/A 12/19/2014   Procedure: POLYPECTOMY;  Surgeon: Danie Binder, MD;  Location: [facility];  Service: Endoscopy;  Laterality: N/A;     Home Medications Prior to Admission medications   Medication Sig Start Date End Date Taking? Authorizing Provider  cyclobenzaprine (FLEXERIL) 10 MG tablet Take 1 tablet (10 mg total) by mouth 2 (two) times daily as needed for muscle spasms. 09/08/22  Yes Rex Kras, PA  albuterol (VENTOLIN HFA) 108 (90 Base) MCG/ACT inhaler Inhale 2 puffs into the lungs every 6 (six) hours as needed for wheezing or shortness of breath. 08/14/21   Lindell Spar, MD  amLODipine (NORVASC) 10 MG tablet TAKE 1 TABLET BY MOUTH ONCE A DAY. 05/30/22   Arnoldo Lenis, MD  aspirin EC 81 MG tablet Take 81 mg by mouth at bedtime.     [provider]  atorvastatin (LIPITOR) 80 MG tablet TAKE ONE TABLET BY MOUTH ONCE DAILY. 03/19/22   Arnoldo Lenis, MD  Budeson-Glycopyrrol-Formoterol (BREZTRI AEROSPHERE) 160-9-4.8 MCG/ACT AERO Inhale 2 puffs into the lungs 2 (two) times daily. 08/13/22   Lindell Spar, MD  chlorthalidone (HYGROTON) 25 MG tablet TAKE 1 TABLET BY MOUTH DAILY. 02/11/22   Arnoldo Lenis, MD  Cholecalciferol (VITAMIN D3) 2000 units TABS Take 4,000 Units by mouth 2 (two) times daily.    [provider]  cloNIDine (CATAPRES) 0.1 MG tablet TAKE  (1) TABLET BY MOUTH TWICE DAILY. 04/14/22   Arnoldo Lenis, MD  empagliflozin (JARDIANCE) 10 MG TABS tablet Take 1 tablet (10 mg total) by mouth daily before breakfast. 05/12/22   Lindell Spar, MD  isosorbide mononitrate (IMDUR) 30 MG 24 hr tablet TAKE 1 TABLET BY MOUTH ONCE DAILY. 05/30/22   Arnoldo Lenis, MD  metoprolol succinate (TOPROL-XL) 100 MG 24 hr tablet TAKE ONE TABLET BY MOUTH ONCE DAILY WITH '50MG'$ . TOTAL OF '150MG'$  DAILY. 04/14/22   Strader, Fransisco Hertz, PA-C  metoprolol succinate (TOPROL-XL) 50 MG 24 hr tablet TAKE 1 TABLET BY MOUTH WITH OR IMMEDIATELY FOLLOWING A MEAL ONCE A DAY. 05/30/22   Arnoldo Lenis, MD  Multiple Vitamin (MULTIVITAMIN WITH MINERALS) TABS tablet Take 1 tablet by mouth daily.    [provider]  nitroGLYCERIN (NITROSTAT) 0.4 MG SL tablet Place 1 tablet (0.4 mg total) under the tongue every 5 (five) minutes as needed for chest pain. 01/18/21  Strader, [place] M, PA-C  Omega 3 1200 MG CAPS Take 2,400 mg by mouth daily.    [provider]  potassium chloride SA (KLOR-CON M) 20 MEQ tablet TAKE ONE TABLET BY MOUTH ONCE DAILY. 08/13/22   Arnoldo Lenis, MD      Allergies    Lisinopril, Crestor [rosuvastatin], Chantix [varenicline], and Neosporin [neomycin-bacitracin zn-polymyx]    Review of Systems   Review of Systems Negative except as per HPI.  Physical Exam Updated Vital Signs BP (!) 147/78   Pulse 78   Temp 98 F (36.7 C) (Oral)   Resp 20   SpO2 98%  Physical Exam Vitals and nursing note reviewed.  Constitutional:      Appearance: Normal appearance.  HENT:     Head: Normocephalic and atraumatic.     Mouth/Throat:     Mouth: Mucous membranes are moist.  Eyes:     General: No scleral icterus. Cardiovascular:     Rate and Rhythm: Normal rate and regular rhythm.     Pulses: Normal pulses.     Heart sounds: Normal heart sounds.  Pulmonary:     Effort: Pulmonary effort is normal.     Breath sounds: Normal breath  sounds.  Abdominal:     General: Abdomen is flat.     Palpations: Abdomen is soft.     Tenderness: There is no abdominal tenderness.  Musculoskeletal:        General: No deformity.     Comments: Tenderness to palpation to paraspinal neck muscle, shoulders and clavicles.  Skin:    General: Skin is warm.     Findings: No rash.  Neurological:     General: No focal deficit present.     Mental Status: He is alert.  Psychiatric:        Mood and Affect: Mood normal.     ED Results / Procedures / Treatments   Labs (all labs ordered are listed, but only abnormal results are displayed) Labs Reviewed - No data to display  EKG None  Radiology DG Chest 2 View  Result Date: 09/08/2022 CLINICAL DATA:  Pain after fall EXAM: CHEST - 2 VIEW COMPARISON:  04/16/2021 x-ray CT 07/21/2022 FINDINGS: No consolidation, pneumothorax or effusion. Normal cardiopericardial silhouette without edema. Degenerative changes of the spine on lateral view. Hypertrophic degenerative changes of the right AC joint. IMPRESSION: No acute cardiopulmonary disease. The lung nodule identified by CT is not clearly seen on this x-ray. Please correlate with prior recommendations Electronically Signed   By: Jill Side M.D.   On: 09/08/2022 13:13   CT Head Wo Contrast  Result Date: 09/08/2022 CLINICAL DATA:  Fall with head strike, neck trauma. EXAM: CT HEAD WITHOUT CONTRAST CT CERVICAL SPINE WITHOUT CONTRAST TECHNIQUE: Multidetector CT imaging of the head and cervical spine was performed following the standard protocol without intravenous contrast. Multiplanar CT image reconstructions of the cervical spine were also generated. RADIATION DOSE REDUCTION: This exam was performed according to the departmental dose-optimization program which includes automated exposure control, adjustment of the mA and/or kV according to patient size and/or use of iterative reconstruction technique. COMPARISON:  Head CT 09/26/2009. FINDINGS: CT HEAD  FINDINGS Brain: No acute hemorrhage, mass effect or midline shift. Gray-white differentiation is preserved. No hydrocephalus. No extra-axial collection. Basilar cisterns are patent. Vascular: No hyperdense vessel or unexpected calcification. Skull: No calvarial fracture or suspicious bone lesion. Skull base is unremarkable. Sinuses/Orbits: Unremarkable. Other: None. CT CERVICAL SPINE FINDINGS Alignment: Normal. Skull base and vertebrae: No acute  fracture. Normal craniocervical junction. No suspicious bone lesions. Soft tissues and spinal canal: No prevertebral fluid or swelling. No visible canal hematoma. Disc levels: Mild cervical spondylosis without high-grade spinal canal stenosis. Upper chest: Unremarkable. Other: None. IMPRESSION: CT HEAD: No acute intracranial process. CT CERVICAL SPINE: No acute cervical spine fracture or traumatic listhesis. Electronically Signed   By: Emmit Alexanders M.D.   On: 09/08/2022 12:53   CT Cervical Spine Wo Contrast  Result Date: 09/08/2022 CLINICAL DATA:  Fall with head strike, neck trauma. EXAM: CT HEAD WITHOUT CONTRAST CT CERVICAL SPINE WITHOUT CONTRAST TECHNIQUE: Multidetector CT imaging of the head and cervical spine was performed following the standard protocol without intravenous contrast. Multiplanar CT image reconstructions of the cervical spine were also generated. RADIATION DOSE REDUCTION: This exam was performed according to the departmental dose-optimization program which includes automated exposure control, adjustment of the mA and/or kV according to patient size and/or use of iterative reconstruction technique. COMPARISON:  Head CT 09/26/2009. FINDINGS: CT HEAD FINDINGS Brain: No acute hemorrhage, mass effect or midline shift. Gray-white differentiation is preserved. No hydrocephalus. No extra-axial collection. Basilar cisterns are patent. Vascular: No hyperdense vessel or unexpected calcification. Skull: No calvarial fracture or suspicious bone lesion. Skull  base is unremarkable. Sinuses/Orbits: Unremarkable. Other: None. CT CERVICAL SPINE FINDINGS Alignment: Normal. Skull base and vertebrae: No acute fracture. Normal craniocervical junction. No suspicious bone lesions. Soft tissues and spinal canal: No prevertebral fluid or swelling. No visible canal hematoma. Disc levels: Mild cervical spondylosis without high-grade spinal canal stenosis. Upper chest: Unremarkable. Other: None. IMPRESSION: CT HEAD: No acute intracranial process. CT CERVICAL SPINE: No acute cervical spine fracture or traumatic listhesis. Electronically Signed   By: Emmit Alexanders M.D.   On: 09/08/2022 12:53    Procedures Procedures    Medications Ordered in ED Medications - No data to display  ED Course/ Medical Decision Making/ A&P                             Medical Decision Making Risk Prescription drug management.   This patient presents to the ED for fall, this involves an extensive number of treatment options, and is a complaint that carries with a high risk of complications and morbidity.  The differential diagnosis includes ich, cva, bone fracture, dislocation, musculoskeletal pain..  This is not an exhaustive list.  Imaging studies: I ordered imaging studies. I personally reviewed, interpreted imaging and agree with the radiologist's interpretations. The results include: CT head, cervical spine and chest x-ray negative.  Problem list/ ED course/ Critical interventions/ Medical management: HPI: See above Vital signs within normal range and stable throughout visit. Laboratory/imaging studies significant for: See above. On physical examination, patient is afebrile and appears in no acute distress. This 67 y.o patient presents after a fall with headache, neck and shoulder pain. Normal appearing without any signs or symptoms of serious injury on secondary trauma survey. Low suspicion for ICH or other intracranial traumatic injury. Pelvis without evidence of injury and  patient is neurologically intact. Explained to patient that they will likely be sore for the coming days and can use tylenol/ibuprofen to control the pain, patient given return precautions.  I also sent an Rx of Flexeril for pain control. I have reviewed the patient home medicines and have made adjustments as needed.  Cardiac monitoring/EKG: The patient was maintained on a cardiac monitor.  I personally reviewed and interpreted the cardiac monitor which showed an underlying rhythm  of: sinus rhythm.  Additional history obtained: External records from outside source obtained and reviewed including: Chart review including previous notes, labs, imaging.  Disposition Continued outpatient therapy. Follow-up with PCP recommended for reevaluation of symptoms. Treatment plan discussed with patient.  Pt acknowledged understanding was agreeable to the plan. Worrisome signs and symptoms were discussed with patient, and patient acknowledged understanding to return to the ED if they noticed these signs and symptoms. Patient was stable upon discharge.   This chart was dictated using voice recognition software.  Despite best efforts to proofread,  errors can occur which can change the documentation meaning.          Final Clinical Impression(s) / ED Diagnoses Final diagnoses:  Fall, initial encounter  Nonintractable headache, unspecified chronicity pattern, unspecified headache type  Acute pain of both shoulders    Rx / DC Orders ED Discharge Orders          Ordered    cyclobenzaprine (FLEXERIL) 10 MG tablet  2 times daily PRN        09/08/22 1516              Rex Kras, PA 09/08/22 2334    Milton Ferguson, MD 09/09/22 1446

## 2022-09-08 NOTE — ED Triage Notes (Addendum)
Pt fell backward onto his head yesterday at 0800. He states he slipped on a ramp that had frost on it. Neck and collar bone and left arm and ribs when he cough hurts. States LOC for short period. He states wind was knocked out of him. Not on blood thinners. He states left arm going numb on outside of fingers. C collar applied. He reports that he has headache which is very unusual for him and he is having some balance issues. States he feels "fuzzy" cognitively.

## 2022-09-08 NOTE — ED Notes (Signed)
C-collar applied in triage.

## 2022-09-08 NOTE — ED Provider Triage Note (Signed)
Emergency Medicine Provider Triage Evaluation Note  Duane Adams , a 68 y.o. male  was evaluated in triage.  Pt complains of fall 2 days ago, hit his head and loss conscious.  Having pain to the head, neck and it radiates to his left arm which feels numb but he is able to move it..  Review of Systems  Per HPI  Physical Exam  BP (!) 147/78   Pulse 78   Temp 98 F (36.7 C) (Oral)   Resp 20   SpO2 98%  Gen:   Awake, no distress   Resp:  Normal effort  MSK:   Moves extremities without difficulty  Other:  In c-collar, grip strength is equal and symmetric bilaterally  Medical Decision Making  Medically screening exam initiated at 12:38 PM.  Appropriate orders placed.  Duane Adams was informed that the remainder of the evaluation will be completed by another provider, this initial triage assessment does not replace that evaluation, and the importance of remaining in the ED until their evaluation is complete.     Sherrill Raring, PA-C 09/08/22 1238

## 2022-09-09 ENCOUNTER — Other Ambulatory Visit: Payer: Self-pay | Admitting: Internal Medicine

## 2022-09-09 ENCOUNTER — Telehealth: Payer: Self-pay

## 2022-09-09 DIAGNOSIS — E1169 Type 2 diabetes mellitus with other specified complication: Secondary | ICD-10-CM

## 2022-09-09 DIAGNOSIS — I5032 Chronic diastolic (congestive) heart failure: Secondary | ICD-10-CM

## 2022-09-09 NOTE — Transitions of Care (Post Inpatient/ED Visit) (Signed)
   09/09/2022  Name: Duane Adams MRN: [id number] DOB: August 27, 1955  Today's TOC FU Call Status: Today's TOC FU Call Status:: Successful TOC FU Call Competed TOC FU Call Complete Date: 09/09/22  Transition Care Management Follow-up Telephone Call Date of Discharge: 09/08/22 Dis[facility] Type of Discharge: Emergency Department Reason for ED Visit: Other: (Fall) How have you been since you were released from the hospital?: Better Any questions or concerns?: No  Items Reviewed: Did you receive and understand the discharge instructions provided?: Yes Medications obtained and verified?: Yes (Medications Reviewed) Any new allergies since your discharge?: No Dietary orders reviewed?: NA Do you have support at home?: Yes  Home Care and Equipment/Supplies: [place] Ordered?: NA Any new equipment or medical supplies ordered?: NA  Functional Questionnaire: Do you need assistance with bathing/showering or dressing?: No Do you need assistance with meal preparation?: No Do you need assistance with eating?: No Do you have difficulty maintaining continence: No Do you need assistance with getting out of bed/getting out of a chair/moving?: No Do you have difficulty managing or taking your medications?: No  Folllow up appointments reviewed: PCP Follow-up appointment confirmed?: No (refused) MD Provider Line [phone] Given: Yes [facility] Follow-up appointment confirmed?: NA Do you need transportation to your follow-up appointment?: No Do you understand care options if your condition(s) worsen?: Yes-patient verbalized understanding    [place] LPN [facility] Direct Dial [phone]

## 2022-09-11 ENCOUNTER — Encounter: Payer: Self-pay | Admitting: Radiology

## 2022-09-18 ENCOUNTER — Other Ambulatory Visit: Payer: Self-pay | Admitting: Internal Medicine

## 2022-09-18 DIAGNOSIS — J410 Simple chronic bronchitis: Secondary | ICD-10-CM

## 2022-10-11 LAB — CMP14+EGFR
ALT: 27 IU/L (ref 0–44)
AST: 23 IU/L (ref 0–40)
Albumin/Globulin Ratio: 1.9 (ref 1.2–2.2)
Albumin: 4.8 g/dL (ref 3.9–4.9)
Alkaline Phosphatase: 66 IU/L (ref 44–121)
BUN/Creatinine Ratio: 15 (ref 10–24)
BUN: 10 mg/dL (ref 8–27)
Bilirubin Total: 0.4 mg/dL (ref 0.0–1.2)
CO2: 22 mmol/L (ref 20–29)
Calcium: 9.7 mg/dL (ref 8.6–10.2)
Chloride: 98 mmol/L (ref 96–106)
Creatinine, Ser: 0.67 mg/dL — ABNORMAL LOW (ref 0.76–1.27)
Globulin, Total: 2.5 g/dL (ref 1.5–4.5)
Glucose: 103 mg/dL — ABNORMAL HIGH (ref 70–99)
Potassium: 3.9 mmol/L (ref 3.5–5.2)
Sodium: 136 mmol/L (ref 134–144)
Total Protein: 7.3 g/dL (ref 6.0–8.5)
eGFR: 104 mL/min/{1.73_m2} (ref >59)

## 2022-10-11 LAB — CBC WITH DIFFERENTIAL/PLATELET
Basophils Absolute: 0 10*3/uL (ref 0.0–0.2)
Basos: 0 %
EOS (ABSOLUTE): 0.2 10*3/uL (ref 0.0–0.4)
Eos: 2 %
Hematocrit: 47.1 % (ref 37.5–51.0)
Hemoglobin: 16.5 g/dL (ref 13.0–17.7)
Immature Grans (Abs): 0 10*3/uL (ref 0.0–0.1)
Immature Granulocytes: 0 %
Lymphocytes Absolute: 2.6 10*3/uL (ref 0.7–3.1)
Lymphs: 32 %
MCH: 31.8 pg (ref 26.6–33.0)
MCHC: 35 g/dL (ref 31.5–35.7)
MCV: 91 fL (ref 79–97)
Monocytes Absolute: 0.6 10*3/uL (ref 0.1–0.9)
Monocytes: 8 %
Neutrophils Absolute: 4.6 10*3/uL (ref 1.4–7.0)
Neutrophils: 58 %
Platelets: 220 10*3/uL (ref 150–450)
RBC: 5.19 x10E6/uL (ref 4.14–5.80)
RDW: 13.3 % (ref 11.6–15.4)
WBC: 8.1 10*3/uL (ref 3.4–10.8)

## 2022-10-11 LAB — LIPID PANEL
Chol/HDL Ratio: 3.8 ratio (ref 0.0–5.0)
Cholesterol, Total: 188 mg/dL (ref 100–199)
HDL: 49 mg/dL (ref >39)
LDL Chol Calc (NIH): 71 mg/dL (ref 0–99)
Triglycerides: 438 mg/dL — ABNORMAL HIGH (ref 0–149)
VLDL Cholesterol Cal: 68 mg/dL — ABNORMAL HIGH (ref 5–40)

## 2022-10-11 LAB — TSH: TSH: 1.39 u[IU]/mL (ref 0.450–4.500)

## 2022-10-11 LAB — HEMOGLOBIN A1C
Est. average glucose Bld gHb Est-mCnc: 131 mg/dL
Hgb A1c MFr Bld: 6.2 % — ABNORMAL HIGH (ref 4.8–5.6)

## 2022-10-11 LAB — PSA: Prostate Specific Ag, Serum: 1.1 ng/mL (ref 0.0–4.0)

## 2022-10-11 LAB — VITAMIN D 25 HYDROXY (VIT D DEFICIENCY, FRACTURES): Vit D, 25-Hydroxy: 32.7 ng/mL (ref 30.0–100.0)

## 2022-10-16 ENCOUNTER — Other Ambulatory Visit: Payer: Self-pay | Admitting: Internal Medicine

## 2022-10-16 ENCOUNTER — Other Ambulatory Visit: Payer: Self-pay | Admitting: Student

## 2022-10-16 ENCOUNTER — Ambulatory Visit (INDEPENDENT_AMBULATORY_CARE_PROVIDER_SITE_OTHER): Payer: Medicare Other | Admitting: Internal Medicine

## 2022-10-16 ENCOUNTER — Encounter: Payer: Self-pay | Admitting: Internal Medicine

## 2022-10-16 VITALS — BP 118/67 | HR 86 | Ht 69.0 in | Wt 179.4 lb

## 2022-10-16 DIAGNOSIS — I10 Essential (primary) hypertension: Secondary | ICD-10-CM

## 2022-10-16 DIAGNOSIS — E1169 Type 2 diabetes mellitus with other specified complication: Secondary | ICD-10-CM | POA: Diagnosis not present

## 2022-10-16 DIAGNOSIS — I25110 Atherosclerotic heart disease of native coronary artery with unstable angina pectoris: Secondary | ICD-10-CM | POA: Diagnosis not present

## 2022-10-16 DIAGNOSIS — R911 Solitary pulmonary nodule: Secondary | ICD-10-CM

## 2022-10-16 DIAGNOSIS — I739 Peripheral vascular disease, unspecified: Secondary | ICD-10-CM | POA: Diagnosis not present

## 2022-10-16 DIAGNOSIS — F17218 Nicotine dependence, cigarettes, with other nicotine-induced disorders: Secondary | ICD-10-CM

## 2022-10-16 DIAGNOSIS — I5032 Chronic diastolic (congestive) heart failure: Secondary | ICD-10-CM

## 2022-10-16 DIAGNOSIS — E782 Mixed hyperlipidemia: Secondary | ICD-10-CM

## 2022-10-16 MED ORDER — FENOFIBRATE 145 MG PO TABS: 145.0000 mg | ORAL_TABLET | Freq: Every day | ORAL | 5 refills | Status: DC

## 2022-10-16 NOTE — Assessment & Plan Note (Signed)
On aspirin and statin currently °Followed by vascular surgery -also has history of subclavian vein stenosis °

## 2022-10-16 NOTE — Assessment & Plan Note (Signed)
Lab Results  Component Value Date   HGBA1C 6.2 (H) 10/10/2022   Well-controlled Had diarrhea and presyncope with metformin On Jardiance considering his cardiac history Advised to follow diabetic diet On statin F/u CMP and lipid panel Diabetic eye exam: Advised to follow up with Ophthalmology for diabetic eye exam  Isolated episodes of numbness and tingling of the feet could be due to diabetic neuropathy, would prefer to avoid any medicine for now

## 2022-10-16 NOTE — Assessment & Plan Note (Addendum)
Noted on CT chest (01/24) Check CT CHEST LCS nodule

## 2022-10-16 NOTE — Assessment & Plan Note (Signed)
S/p stent placement On aspirin and statin Was on Plavix, was discontinued due to GI bleeding Currently does not have chest pain On beta-blocker, Imdur and amlodipine Followed by Cardiology 

## 2022-10-16 NOTE — Assessment & Plan Note (Signed)
Smokes about 0.5 pack/day ? ?Asked about quitting: confirms that he/she currently smokes cigarettes ?Advise to quit smoking: Educated about QUITTING to reduce the risk of cancer, cardio and cerebrovascular disease. ?Assess willingness: Unwilling to quit at this time, but is working on cutting back. ?Assist with counseling and pharmacotherapy: Counseled for 5 minutes and literature provided. ?Arrange for follow up: follow up in 3 months and continue to offer help. ?

## 2022-10-16 NOTE — Progress Notes (Signed)
Established Patient Office Visit  Subjective:  Patient ID: Duane Adams, male    DOB: [birth year]  Age: 68 y.o. MRN: [id number]  CC:  Chief Complaint  Patient presents with   Diabetes    Four month follow up    HPI Duane Adams is a 67 y.o. male with past medical history of HTN, CAD s/p stent placement, PAD, type 2 DM, HLD, COPD, GERD, liver fibrosis, chronic low back pain and tobacco abuse who presents for f/u of his chronic medical conditions.  CAD, PAD and HTN: BP is well-controlled. Takes medications regularly. Patient denies headache, dizziness, chest pain, or palpitations.  He follows up with cardiology for history of CAD.  He also saw vascular surgery for history of subclavian vein stenosis.  Type II DM: His last HbA1c was 6.2. He has been tolerating Jardiance well.  He denies any polyuria, polyphagia or chronic fatigue.  He had a mechanical fall at home, while he was walking down the ramp, slipped and fell backward, landed on his shoulders and he hit his head.  He had CT of head and cervical spine, which were unremarkable.  He has mild, intermittent tingling of the last 2 digits of the left hand.  He has intermittent neck pain, but has been improving.  Solitary pulmonary nodule: He was found to have suspicious pulmonary nodule on CT chest.  He currently denies any chronic cough, hemoptysis, recent weight loss, night sweats or LAD.     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   Concussion several yrs ago   no residual   Coronary artery disease    a. 03/2013 Cath/PCI: LM nl, LAD 81m (2.5x20 Promus Premier DES), LCX min irregs, RCA dom, 90p(3.0x20 Promus Premier DES), EF 55-65%. b. patent stents by caths in 08/2016 and 01/2021   CVA (cerebral vascular accident) 01/31/2019   slight memory issues   Essential hypertension, benign    GERD (gastroesophageal reflux disease)     Helicobacter pylori gastritis JUN 2016 EGD/Bx   PREVPAK BID FOR 14 DAYS   Hepatitis    Hepatitis C treated  2011   Hiatal hernia    History of kidney stones    Mixed hyperlipidemia    Neuroma    Prediabetes 06/23/2019   Skin cancer    Substance abuse    alcoholic quit [id number]   Tobacco abuse     Past Surgical History:  Procedure Laterality Date   ABDOMINAL AORTOGRAM W/LOWER EXTREMITY N/A 06/15/2017   Procedure: ABDOMINAL AORTOGRAM W/LOWER EXTREMITY;  Surgeon: Angelia Mould, MD;  Location: [facility];  Service: Cardiovascular;  Laterality: N/A;  Bilateral   BIOPSY N/A 12/19/2014   Procedure: BIOPSY;  Surgeon: Danie Binder, MD;  Location: [facility];  Service: Endoscopy;  Laterality: N/A;   COLONOSCOPY  12/2014   [facility]: with endoscopic mucosal resection. 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 Needs colonoscopy in 1 year.    COLONOSCOPY WITH PROPOFOL N/A 12/19/2014   Dr. Oneida Alar: six simple adenomas and 3 hyperplastic polyps. Had flat mid-transverse colon polyp and referred to [facility] for endoscopic mucosal resection, which is scheduled for July 22   COLONOSCOPY WITH PROPOFOL N/A 04/01/2021   Surgeon: Eloise Harman, DO;Nonbleeding internal hemorrhoids, diverticulosis in the sigmoid colon, tattoo in transverse colon with tattoo site appearing normal.  Recommended repeat colonoscopy in 5 years.  CORONARY ANGIOPLASTY WITH STENT PLACEMENT  04/11/2013   LAD &  RCA     DR COOPER   ESOPHAGOGASTRODUODENOSCOPY (EGD) WITH PROPOFOL N/A 12/19/2014   Dr. Oneida Alar:  without varices, small hiatal hernia noted, moderate non-erosive gastritis and mild duodenitis.  POSITIVE H.PYLORI. Prescribed Prevpac.   EXCISION MORTON'S NEUROMA Left 08/15/2020   Procedure: EXCISION MORTON'S NEUROMA THIRD INTERSPACE LEFT FOOT;  Surgeon: [name], DPM;  Location: [place];  Service: Podiatry;  Laterality: Left;   EXTRACORPOREAL SHOCK WAVE LITHOTRIPSY  2001    FLEXIBLE SIGMOIDOSCOPY N/A 12/23/2017   Procedure: FLEXIBLE SIGMOIDOSCOPY;  Surgeon: Danie Binder, MD;  [facility];  Service: Endoscopy;  Laterality: N/A;  1:45pm   HAND RECONSTRUCTION Left    HEMORRHOID BANDING N/A 12/23/2017   Procedure: HEMORRHOID BANDING;  Surgeon: Danie Binder, MD;  [facility];  Service: Endoscopy;  Laterality: N/A;   LEFT HEART CATH AND CORONARY ANGIOGRAPHY N/A 01/22/2021   Procedure: LEFT HEART CATH AND CORONARY ANGIOGRAPHY;  Surgeon: Martinique, Peter M, MD;  Location: [place];  Service: Cardiovascular;  Laterality: N/A;   LEFT HEART CATHETERIZATION WITH CORONARY ANGIOGRAM N/A 04/11/2013   Procedure: LEFT HEART CATHETERIZATION WITH CORONARY ANGIOGRAM;  Surgeon: Blane Ohara, MD;  Location[facility];  Service: Cardiovascular;  Laterality: N/A;   LEG TENDON SURGERY Right    LIPOMA EXCISION Left 09/13/2021   Procedure: EXCISION LIPOMA; SHOULDER, 4CM;  Surgeon: Rusty Aus, DO;  Location: [facility];  Service: General;  Laterality: Left;   MASS EXCISION N/A 09/13/2021   Procedure: EXCISION MASS; BACK, 3CM;  Surgeon: Rusty Aus, DO;  Location: [facility];  Service: General;  Laterality: N/A;   POLYPECTOMY N/A 12/19/2014   Procedure: POLYPECTOMY;  Surgeon: Danie Binder, MD;  Location: [facility];  Service: Endoscopy;  Laterality: N/A;    Family History  Problem Relation Age of Onset   Stroke Mother    Aneurysm Mother    Hypertension Mother    Heart disease Father    Hypertension Father    Heart disease Brother    Hypertension Brother    Alcohol abuse Brother    Diabetes Paternal Aunt    Colon cancer Neg Hx     Social History   Socioeconomic History   Marital status: Significant Other    Spouse name: Judie Petit   Number of children: 2   Years of education: 87   Highest education level: Not on file  Occupational History   Occupation: disabled    Comment: heart  Tobacco Use   Smoking status: Every Day     Packs/day: 0.50    Years: 50.00    Additional pack years: 0.00    Total pack years: 25.00    Types: Cigarettes    Start date: 07/14/1968   Smokeless tobacco: Never   Tobacco comments:    5-6 Cigarettes per day  Vaping Use   Vaping Use: Never used  Substance and Sexual Activity   Alcohol use: Yes    Comment: 2-3 beer a couple times a year (10/21/21) Previously 5-6 beer every 2 weeks.   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 90 tablet 1   aspirin EC 81 MG tablet Take 81 mg by mouth at bedtime.      atorvastatin (LIPITOR) 80 MG tablet TAKE ONE TABLET BY MOUTH ONCE DAILY. 90 tablet 3   Budeson-Glycopyrrol-Formoterol (BREZTRI AEROSPHERE) 160-9-4.8 MCG/ACT AERO Inhale 2 puffs into the lungs 2 (two) times daily. 10.7 g 11   chlorthalidone (HYGROTON) 25 MG tablet TAKE 1 TABLET BY MOUTH DAILY. 90 tablet 1   Cholecalciferol (VITAMIN D3) 2000 units TABS Take 4,000 Units by mouth 2 (two) times daily.     cloNIDine (CATAPRES) 0.1 MG tablet TAKE (1) TABLET BY MOUTH TWICE DAILY. 180 tablet 3   cyclobenzaprine (FLEXERIL) 10 MG tablet Take 1 tablet (10 mg total) by mouth 2 (two) times daily as needed for muscle spasms. 20 tablet 0   isosorbide mononitrate (IMDUR) 30 MG 24 hr tablet TAKE 1 TABLET BY MOUTH ONCE DAILY.  90 tablet 1   JARDIANCE 10 MG TABS tablet TAKE 1 TABLET BY MOUTH ONCE DAILY BEFORE BREAKFAST. 30 tablet 0   metoprolol succinate (TOPROL-XL) 100 MG 24 hr tablet TAKE ONE TABLET BY MOUTH ONCE DAILY WITH 50MG . TOTAL OF 150MG  DAILY. 90 tablet 0   metoprolol succinate (TOPROL-XL) 50 MG 24 hr tablet TAKE 1 TABLET BY MOUTH WITH OR IMMEDIATELY FOLLOWING A MEAL ONCE A DAY. 90 tablet 1   Multiple Vitamin (MULTIVITAMIN WITH MINERALS) TABS tablet Take 1 tablet by mouth daily.     nitroGLYCERIN (NITROSTAT) 0.4 MG SL tablet Place 1 tablet (0.4 mg total) under the tongue every 5 (five) minutes as needed for chest pain. 25 tablet 3   Omega 3 1200 MG CAPS Take 2,400 mg by mouth daily.     potassium chloride SA (KLOR-CON M) 20 MEQ tablet TAKE ONE TABLET BY MOUTH ONCE DAILY. 90 tablet 0   VENTOLIN HFA 108 (90 Base) MCG/ACT inhaler INHALE 2 PUFFS INTO THE LUNGS EVERY 6 HOURS AS NEEDED FOR WHEEZING OR SHORTNESS OF BREATH. 18 g 0   No facility-administered medications prior to visit.    Allergies  Allergen Reactions   Lisinopril Swelling    Swelling neck   Crestor [Rosuvastatin] Other (See Comments)    Severe muscle aches   Chantix [Varenicline] Other (See Comments)    Abdominal pain   Neosporin [Neomycin-Bacitracin Zn-Polymyx] Rash    blistered    ROS Review of Systems  Constitutional:  Positive for fatigue. Negative for chills and fever.  HENT:  Negative for congestion and sore throat.   Eyes:  Negative for pain and discharge.  Respiratory:  Positive for shortness of breath. Negative for cough.   Cardiovascular:  Negative for chest pain and palpitations.  Gastrointestinal:  Negative for diarrhea, nausea and vomiting.  Endocrine: Negative for polydipsia and polyuria.  Genitourinary:  Negative for dysuria and hematuria.  Musculoskeletal:  Positive for arthralgias and back pain. Negative for neck pain and neck stiffness.  Skin:  Negative for rash.  Neurological:  Negative for weakness and headaches.   Psychiatric/Behavioral:  Negative for agitation and behavioral problems.  Objective:    Physical Exam Vitals reviewed.  Constitutional:      General: He is not in acute distress.    Appearance: He is not diaphoretic.  HENT:     Head: Normocephalic and atraumatic.     Nose: Nose normal.     Mouth/Throat:     Mouth: Mucous membranes are moist.  Eyes:     General: No scleral icterus.    Extraocular Movements: Extraocular movements intact.  Cardiovascular:     Rate and Rhythm: Normal rate and regular rhythm.     Pulses: Normal pulses.     Heart sounds: Normal heart sounds. No murmur heard. Pulmonary:     Breath sounds: Normal breath sounds. No wheezing or rales.  Musculoskeletal:     Cervical back: Neck supple. No tenderness.     Right lower leg: No edema.     Left lower leg: No edema.  Skin:    General: Skin is warm.     Findings: No rash.  Neurological:     General: No focal deficit present.     Mental Status: He is alert and oriented to person, place, and time.     Sensory: No sensory deficit.     Motor: No weakness.  Psychiatric:        Mood and Affect: Mood normal.        Behavior: Behavior normal.     BP 118/67 (BP Location: Right Arm, Patient Position: Sitting, Cuff Size: Large)   Pulse 86   Ht 5\' 9"  (1.753 m)   Wt 179 lb 6.4 oz (81.4 kg)   SpO2 95%   BMI 26.49 kg/m  Wt Readings from Last 3 Encounters:  10/16/22 179 lb 6.4 oz (81.4 kg)  08/27/22 178 lb (80.7 kg)  08/13/22 182 lb 12.8 oz (82.9 kg)    Lab Results  Component Value Date   TSH 1.390 10/10/2022   Lab Results  Component Value Date   WBC 8.1 10/10/2022   HGB 16.5 10/10/2022   HCT 47.1 10/10/2022   MCV 91 10/10/2022   PLT 220 10/10/2022   Lab Results  Component Value Date   NA 136 10/10/2022   K 3.9 10/10/2022   CO2 22 10/10/2022   GLUCOSE 103 (H) 10/10/2022   BUN 10 10/10/2022   CREATININE 0.67 (L) 10/10/2022   BILITOT 0.4 10/10/2022   ALKPHOS 66 10/10/2022   AST 23  10/10/2022   ALT 27 10/10/2022   PROT 7.3 10/10/2022   ALBUMIN 4.8 10/10/2022   CALCIUM 9.7 10/10/2022   ANIONGAP 7 03/19/2021   EGFR 104 10/10/2022   Lab Results  Component Value Date   CHOL 188 10/10/2022   Lab Results  Component Value Date   HDL 49 10/10/2022   Lab Results  Component Value Date   LDLCALC 71 10/10/2022   Lab Results  Component Value Date   TRIG 438 (H) 10/10/2022   Lab Results  Component Value Date   CHOLHDL 3.8 10/10/2022   Lab Results  Component Value Date   HGBA1C 6.2 (H) 10/10/2022      Assessment & Plan:   Problem List Items Addressed This Visit       Cardiovascular and Mediastinum   Coronary artery disease    S/p stent placement On aspirin and statin Was on Plavix, was discontinued due to GI bleeding Currently does not have chest pain On beta-blocker, Imdur and amlodipine Followed by Cardiology      Relevant Medications   fenofibrate (TRICOR) 145  MG tablet   Essential hypertension    BP Readings from Last 1 Encounters:  10/16/22 118/67  Well-controlled Counseled for compliance with the medications Advised DASH diet and moderate exercise/walking, at least 150 mins/week      Relevant Medications   fenofibrate (TRICOR) 145 MG tablet   PAD (peripheral artery disease)    On aspirin and statin currently Followed by vascular surgery -also has history of subclavian vein stenosis      Relevant Medications   fenofibrate (TRICOR) 145 MG tablet   Chronic heart failure with preserved ejection fraction    Appears euvolemic currently On Jardiance Followed by cardiology      Relevant Medications   fenofibrate (TRICOR) 145 MG tablet     Endocrine   Diabetes mellitus - Primary    Lab Results  Component Value Date   HGBA1C 6.2 (H) 10/10/2022  Well-controlled Had diarrhea and presyncope with metformin On Jardiance considering his cardiac history Advised to follow diabetic diet On statin F/u CMP and lipid panel Diabetic eye  exam: Advised to follow up with Ophthalmology for diabetic eye exam  Isolated episodes of numbness and tingling of the feet could be due to diabetic neuropathy, would prefer to avoid any medicine for now      Relevant Orders   Hemoglobin A1c   CMP14+EGFR   Ambulatory referral to Optometry     Other   Hyperlipidemia    Last lipid profile reviewed On Lipitor currently      Relevant Medications   fenofibrate (TRICOR) 145 MG tablet   Other Relevant Orders   Lipid Profile   CMP14+EGFR   Nicotine dependence    Smokes about 0.5 pack/day  Asked about quitting: confirms that he/she currently smokes cigarettes Advise to quit smoking: Educated about QUITTING to reduce the risk of cancer, cardio and cerebrovascular disease. Assess willingness: Unwilling to quit at this time, but is working on cutting back. Assist with counseling and pharmacotherapy: Counseled for 5 minutes and literature provided. Arrange for follow up: follow up in 3 months and continue to offer help.      Solitary lung nodule    Noted on CT chest (01/24) Check CT CHEST LCS nodule      Relevant Orders   CT CHEST LCS NODULE F/U LOW DOSE WO CONTRAST   Meds ordered this encounter  Medications   fenofibrate (TRICOR) 145 MG tablet    Sig: Take 1 tablet (145 mg total) by mouth daily.    Dispense:  30 tablet    Refill:  5    Follow-up: Return in about 4 months (around 02/15/2023) for DM and COPD.    Lindell Spar, MD

## 2022-10-16 NOTE — Patient Instructions (Addendum)
Please continue to take medications as prescribed.  Please continue to follow low carb diet and perform moderate exercise/walking at least 150 mins/week.  Please get fasting blood tests done before the next visit. 

## 2022-10-16 NOTE — Assessment & Plan Note (Signed)
BP Readings from Last 1 Encounters:  10/16/22 118/67   Well-controlled Counseled for compliance with the medications Advised DASH diet and moderate exercise/walking, at least 150 mins/week

## 2022-10-16 NOTE — Assessment & Plan Note (Signed)
Appears euvolemic currently On Jardiance Followed by cardiology 

## 2022-10-16 NOTE — Assessment & Plan Note (Signed)
Last lipid profile reviewed On Lipitor currently 

## 2022-11-17 ENCOUNTER — Other Ambulatory Visit: Payer: Self-pay | Admitting: Cardiology

## 2022-11-17 ENCOUNTER — Other Ambulatory Visit: Payer: Self-pay | Admitting: Internal Medicine

## 2022-11-17 DIAGNOSIS — E1169 Type 2 diabetes mellitus with other specified complication: Secondary | ICD-10-CM

## 2022-11-17 DIAGNOSIS — I5032 Chronic diastolic (congestive) heart failure: Secondary | ICD-10-CM

## 2022-11-27 ENCOUNTER — Other Ambulatory Visit: Payer: Self-pay | Admitting: Internal Medicine

## 2022-11-27 DIAGNOSIS — J410 Simple chronic bronchitis: Secondary | ICD-10-CM

## 2022-12-09 ENCOUNTER — Other Ambulatory Visit: Payer: Self-pay | Admitting: Cardiology

## 2022-12-10 ENCOUNTER — Other Ambulatory Visit: Payer: Self-pay | Admitting: Cardiology

## 2022-12-22 ENCOUNTER — Other Ambulatory Visit: Payer: Self-pay | Admitting: Internal Medicine

## 2022-12-22 ENCOUNTER — Other Ambulatory Visit: Payer: Self-pay | Admitting: Cardiology

## 2022-12-22 DIAGNOSIS — E1169 Type 2 diabetes mellitus with other specified complication: Secondary | ICD-10-CM

## 2022-12-22 DIAGNOSIS — I5032 Chronic diastolic (congestive) heart failure: Secondary | ICD-10-CM

## 2022-12-24 ENCOUNTER — Other Ambulatory Visit: Payer: Self-pay | Admitting: Cardiology

## 2023-01-07 ENCOUNTER — Other Ambulatory Visit: Payer: Self-pay | Admitting: Internal Medicine

## 2023-01-07 DIAGNOSIS — J410 Simple chronic bronchitis: Secondary | ICD-10-CM

## 2023-01-19 ENCOUNTER — Other Ambulatory Visit: Payer: Self-pay | Admitting: Student

## 2023-01-20 ENCOUNTER — Other Ambulatory Visit: Payer: Self-pay | Admitting: Internal Medicine

## 2023-01-20 DIAGNOSIS — E1169 Type 2 diabetes mellitus with other specified complication: Secondary | ICD-10-CM

## 2023-01-20 DIAGNOSIS — I5032 Chronic diastolic (congestive) heart failure: Secondary | ICD-10-CM

## 2023-02-02 ENCOUNTER — Encounter: Payer: Self-pay | Admitting: *Deleted

## 2023-02-05 ENCOUNTER — Other Ambulatory Visit: Payer: Self-pay

## 2023-02-05 DIAGNOSIS — Z87891 Personal history of nicotine dependence: Secondary | ICD-10-CM

## 2023-02-05 DIAGNOSIS — R911 Solitary pulmonary nodule: Secondary | ICD-10-CM

## 2023-02-06 ENCOUNTER — Encounter: Payer: Self-pay | Admitting: Acute Care

## 2023-02-06 ENCOUNTER — Ambulatory Visit (INDEPENDENT_AMBULATORY_CARE_PROVIDER_SITE_OTHER): Payer: Medicare Other | Admitting: Acute Care

## 2023-02-06 DIAGNOSIS — R911 Solitary pulmonary nodule: Secondary | ICD-10-CM

## 2023-02-06 DIAGNOSIS — F17210 Nicotine dependence, cigarettes, uncomplicated: Secondary | ICD-10-CM | POA: Diagnosis not present

## 2023-02-06 NOTE — Patient Instructions (Signed)
Thank you for participating in the [facility]. It was our pleasure to meet you today. We will call you with the results of your scan within the next few days. Your scan will be assigned a Lung RADS category score by the physicians reading the scans.  This Lung RADS score determines follow up scanning.  See below for description of categories, and follow up screening recommendations. We will be in touch to schedule your follow up screening annually or based on recommendations of our providers. We will fax a copy of your scan results to your Primary Care Physician, or the physician who referred you to the program, to ensure they have the results. Please call the office if you have any questions or concerns regarding your scanning experience or results.  Our office number is [phone]. Please speak with Abigail Miyamoto, RN. , or  Karlton Lemon RN, They are  our Lung Cancer Screening RN.'s If They are unavailable when you call, Please leave a message on the voice mail. We will return your call at our earliest convenience.This voice mail is monitored several times a day.  Remember, if your scan is normal, we will scan you annually as long as you continue to meet the criteria for the program. (Age 66-80, Current smoker or smoker who has quit within the last 15 years). If you are a smoker, remember, quitting is the single most powerful action that you can take to decrease your risk of lung cancer and other pulmonary, breathing related problems. We know quitting is hard, and we are here to help.  Please let us know if there is anything we can do to help you meet your goal of quitting. If you are a former smoker, Counselling psychologist. We are proud of you! Remain smoke free! Remember you can refer friends or family members through the number above.  We will screen them to make sure they meet criteria for the program. Thank you for helping [place] take better care of you by  participating in Lung Screening.   Lung RADS Categories:  Lung RADS 1: no nodules or definitely non-concerning nodules.  Recommendation is for a repeat annual scan in 12 months.  Lung RADS 2:  nodules that are non-concerning in appearance and behavior with a very low likelihood of becoming an active cancer. Recommendation is for a repeat annual scan in 12 months.  Lung RADS 3: nodules that are probably non-concerning , includes nodules with a low likelihood of becoming an active cancer.  Recommendation is for a 32-month repeat screening scan. Often noted after an upper respiratory illness. We will be in touch to make sure you have no questions, and to schedule your 31-month scan.  Lung RADS 4 A: nodules with concerning findings, recommendation is most often for a follow up scan in 3 months or additional testing based on our provider's assessment of the scan. We will be in touch to make sure you have no questions and to schedule the recommended 3 month follow up scan.  Lung RADS 4 B:  indicates findings that are concerning. We will be in touch with you to schedule additional diagnostic testing based on our provider's  assessment of the scan.  You can receive free nicotine replacement therapy ( patches, gum or mints) by calling 1-800-QUIT NOW. Please call so we can get you on the path to becoming  a non-smoker. I know it is hard, but you can do this!  Other options for assistance in smoking  cessation ( As covered by your insurance benefits)  Hypnosis for smoking cessation  [employer] [phone]  Acupuncture for smoking cessation  [employer] [phone]

## 2023-02-06 NOTE — Progress Notes (Signed)
Virtual Visit via Telephone Note  I connected with Duane Adams on 02/06/23 at 10:30 AM EDT by telephone and verified that I am speaking with the correct person using two identifiers.  Location: Patient: At home Provider: [address], [place], [place], [address]    I discussed the limitations, risks, security and privacy concerns of performing an evaluation and management service by telephone and the availability of in person appointments. I also discussed with the patient that there may be a patient responsible charge related to this service. The patient expressed understanding and agreed to proceed.   Shared Decision Making Visit Lung Cancer Screening Program [id number])   Eligibility: Age 67 y.o. Pack Years Smoking History Calculation  48 pack year smoking history (# packs/per year x # years smoked) Recent History of coughing up blood  no Unexplained weight loss? no ( >Than 15 pounds within the last 6 months ) Prior History Lung / other cancer no (Diagnosis within the last 5 years already requiring surveillance chest CT Scans). Smoking Status Current Smoker Former Smokers: Years since quit:  NA  Quit Date:  NA  Visit Components: Discussion included one or more decision making aids. yes Discussion included risk/benefits of screening. yes Discussion included potential follow up diagnostic testing for abnormal scans. yes Discussion included meaning and risk of over diagnosis. yes Discussion included meaning and risk of False Positives. yes Discussion included meaning of total radiation exposure. yes  Counseling Included: Importance of adherence to annual lung cancer LDCT screening. yes Impact of comorbidities on ability to participate in the program. yes Ability and willingness to under diagnostic treatment. yes  Smoking Cessation Counseling: Current Smokers:  Discussed importance of smoking cessation. yes Information about tobacco cessation classes and  interventions provided to patient. yes Patient provided with "ticket" for LDCT Scan. yes Symptomatic Patient. no  Counseling NA Diagnosis Code: Tobacco Use Z72.0 Asymptomatic Patient yes  Counseling (Intermediate counseling: > three minutes counseling) [id number] Former Smokers:  Discussed the importance of maintaining cigarette abstinence. yes Diagnosis Code: Personal History of Nicotine Dependence. X91.478 Information about tobacco cessation classes and interventions provided to patient. Yes Patient provided with "ticket" for LDCT Scan. yes Written Order for Lung Cancer Screening with LDCT placed in Epic. Yes (CT Chest Lung Cancer Screening Low Dose W/O CM) [id number] Z12.2-Screening of respiratory organs Z87.891-Personal history of nicotine dependence  I have spent 25 minutes of face to face/ virtual visit   time with  Duane Adams discussing the risks and benefits of lung cancer screening. We viewed / discussed a power point together that explained in detail the above noted topics. We paused at intervals to allow for questions to be asked and answered to ensure understanding.We discussed that the single most powerful action that he can take to decrease his risk of developing lung cancer is to quit smoking. We discussed whether or not he is ready to commit to setting a quit date. We discussed options for tools to aid in quitting smoking including nicotine replacement therapy, non-nicotine medications, support groups, Quit Smart classes, and behavior modification. We discussed that often times setting smaller, more achievable goals, such as eliminating 1 cigarette a day for a week and then 2 cigarettes a day for a week can be helpful in slowly decreasing the number of cigarettes smoked. This allows for a sense of accomplishment as well as providing a clinical benefit. I provided  him  with smoking cessation  information  with contact information for community resources, classes, free  nicotine replacement  therapy, and access to mobile apps, text messaging, and on-line smoking cessation help. I have also provided  him  the office contact information in the event he needs to contact me, or the screening staff. We discussed the time and location of the scan, and that either Abigail Miyamoto RN, Karlton Lemon, RN  or I will call / send a letter with the results within 24-72 hours of receiving them. The patient verbalized understanding of all of  the above and had no further questions upon leaving the office. They have my contact information in the event they have any further questions.  I spent 3 minutes counseling on smoking cessation and the health risks of continued tobacco abuse.  I explained to the patient that there has been a high incidence of coronary artery disease noted on these exams. I explained that this is a non-gated exam therefore degree or severity cannot be determined. This patient is on statin therapy. I have asked the patient to follow-up with their PCP regarding any incidental finding of coronary artery disease and management with diet or medication as their PCP  feels is clinically indicated. The patient verbalized understanding of the above and had no further questions upon completion of the visit.      Bevelyn Ngo, NP 02/06/2023

## 2023-02-11 ENCOUNTER — Ambulatory Visit (HOSPITAL_COMMUNITY)
Admission: RE | Admit: 2023-02-11 | Discharge: 2023-02-11 | Disposition: A | Discharge status: Home / Self Care | Payer: Medicare Other | Source: Ambulatory Visit | Attending: Acute Care | Admitting: Acute Care

## 2023-02-11 DIAGNOSIS — Z87891 Personal history of nicotine dependence: Secondary | ICD-10-CM | POA: Diagnosis present

## 2023-02-11 DIAGNOSIS — R911 Solitary pulmonary nodule: Secondary | ICD-10-CM | POA: Insufficient documentation

## 2023-02-19 ENCOUNTER — Ambulatory Visit: Payer: Medicare Other | Admitting: Internal Medicine

## 2023-02-20 ENCOUNTER — Other Ambulatory Visit: Payer: Self-pay | Admitting: Internal Medicine

## 2023-02-20 DIAGNOSIS — J410 Simple chronic bronchitis: Secondary | ICD-10-CM

## 2023-02-20 DIAGNOSIS — I5032 Chronic diastolic (congestive) heart failure: Secondary | ICD-10-CM

## 2023-02-20 DIAGNOSIS — E1169 Type 2 diabetes mellitus with other specified complication: Secondary | ICD-10-CM

## 2023-03-04 ENCOUNTER — Encounter: Payer: Self-pay | Admitting: Internal Medicine

## 2023-03-04 ENCOUNTER — Ambulatory Visit (INDEPENDENT_AMBULATORY_CARE_PROVIDER_SITE_OTHER): Payer: Medicare Other | Admitting: Internal Medicine

## 2023-03-04 VITALS — BP 142/74 | HR 76 | Ht 69.0 in | Wt 175.5 lb

## 2023-03-04 DIAGNOSIS — R911 Solitary pulmonary nodule: Secondary | ICD-10-CM

## 2023-03-04 DIAGNOSIS — I5032 Chronic diastolic (congestive) heart failure: Secondary | ICD-10-CM

## 2023-03-04 DIAGNOSIS — E782 Mixed hyperlipidemia: Secondary | ICD-10-CM

## 2023-03-04 DIAGNOSIS — Z7984 Long term (current) use of oral hypoglycemic drugs: Secondary | ICD-10-CM

## 2023-03-04 DIAGNOSIS — B351 Tinea unguium: Secondary | ICD-10-CM

## 2023-03-04 DIAGNOSIS — N401 Enlarged prostate with lower urinary tract symptoms: Secondary | ICD-10-CM

## 2023-03-04 DIAGNOSIS — E1169 Type 2 diabetes mellitus with other specified complication: Secondary | ICD-10-CM

## 2023-03-04 DIAGNOSIS — I10 Essential (primary) hypertension: Secondary | ICD-10-CM

## 2023-03-04 DIAGNOSIS — I739 Peripheral vascular disease, unspecified: Secondary | ICD-10-CM

## 2023-03-04 DIAGNOSIS — J410 Simple chronic bronchitis: Secondary | ICD-10-CM | POA: Diagnosis not present

## 2023-03-04 DIAGNOSIS — R351 Nocturia: Secondary | ICD-10-CM

## 2023-03-04 MED ORDER — VASCEPA 1 G PO CAPS: 2.0000 g | ORAL_CAPSULE | Freq: Two times a day (BID) | ORAL | 5 refills | Status: DC

## 2023-03-04 MED ORDER — TERBINAFINE HCL 250 MG PO TABS: 250.0000 mg | ORAL_TABLET | Freq: Every day | ORAL | 2 refills | Status: DC

## 2023-03-04 NOTE — Assessment & Plan Note (Addendum)
Lab Results  Component Value Date   HGBA1C 6.1 (H) 02/23/2023   Well-controlled Associated with HTN, CAD and PAD Had diarrhea and presyncope with metformin On Jardiance considering his cardiac history Advised to follow diabetic diet On statin F/u CMP and lipid panel Diabetic eye exam: Advised to follow up with Ophthalmology for diabetic eye exam  Isolated episodes of numbness and tingling of the feet could be due to diabetic neuropathy, would prefer to avoid any medicine for now

## 2023-03-04 NOTE — Assessment & Plan Note (Addendum)
BP Readings from Last 1 Encounters:  03/04/23 (!) 142/74   Elevated today as he has not had his medications today Usually well-controlled with Amlodipine 10 mg QD, Metoprolol 150 mg QD, Chlorthalidone 25 mg QD, Imdur 30 mg QD and Clonidine 0.1 mg BID Counseled for compliance with the medications Advised DASH diet and moderate exercise/walking, at least 150 mins/week

## 2023-03-04 NOTE — Assessment & Plan Note (Addendum)
Last lipid profile reviewed On Lipitor currently Has persistently elevated TG, did not tolerate fenofibrate -takes fish oil Added Vascepa for hypertriglyceridemia

## 2023-03-04 NOTE — Patient Instructions (Addendum)
Please continue to take medications as prescribed.  Please continue to follow low carb diet and perform moderate exercise/walking at least 150 mins/week.  Please get fasting blood tests done before the next visit.  Please consider getting Shingrix vaccine at local pharmacy.  

## 2023-03-04 NOTE — Assessment & Plan Note (Signed)
Has been seen by Dr. Craige Cotta Was given Symbicort, but did not help much Well-controlled with [facility] as maintenance inhaler Has albuterol inhaler as needed for dyspnea/wheezing

## 2023-03-04 NOTE — Assessment & Plan Note (Signed)
Noted on CT chest (01/24), with repeat CT chest showed stable sized  pulmonary nodule Recheck CT CHEST LCS nodule

## 2023-03-04 NOTE — Assessment & Plan Note (Signed)
Reports nocturia, likely due to BPH Prefers to avoid medicine for now Check PSA

## 2023-03-04 NOTE — Assessment & Plan Note (Signed)
Appears euvolemic currently On Jardiance Followed by cardiology 

## 2023-03-04 NOTE — Assessment & Plan Note (Addendum)
On aspirin and statin currently Used to be followed by vascular surgery -also has history of subclavian vein stenosis

## 2023-03-04 NOTE — Progress Notes (Signed)
Established Patient Office Visit  Subjective:  Patient ID: Duane Adams, male    DOB: Feb 19, 1956  Age: 67 y.o. MRN: [id number]  CC:  Chief Complaint  Patient presents with   COPD    4 month follow up    Diabetes    4 month follow up     HPI Duane Adams is a 67 y.o. male with past medical history of HTN, CAD s/p stent placement, PAD, type 2 DM, HLD, COPD, GERD, liver fibrosis, chronic low back pain and tobacco abuse who presents for f/u of his chronic medical conditions.  CAD, PAD and HTN: BP is well-controlled. Takes medications regularly. Patient denies headache, dizziness, chest pain, or palpitations.  He follows up with cardiology for history of CAD.  He also saw vascular surgery for history of subclavian vein stenosis.  He currently takes Lipitor 80 mg QD for HLD.  He was given fenofibrate for hypertriglyceridemia, but had severe GI discomfort with it.  He still takes fish oil.  Type II DM: His last HbA1c was 6.1. He has been tolerating Jardiance well.  He denies any polyuria, polyphagia or chronic fatigue.  COPD: He has noticed improvement in dyspnea since using Breztri and has needed albuterol less frequently.  Solitary pulmonary nodule: He was found to have suspicious pulmonary nodule on CT chest.  He currently denies any chronic cough, hemoptysis, recent weight loss, night sweats or LAD.     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   Concussion several yrs ago   no residual   Coronary artery disease    a. 03/2013 Cath/PCI: LM nl, LAD 49m (2.5x20 Promus Premier DES), LCX min irregs, RCA dom, 90p(3.0x20 Promus Premier DES), EF 55-65%. b. patent stents by caths in 08/2016 and 01/2021   CVA (cerebral vascular accident) (HCC) 01/31/2019   slight memory issues   Essential hypertension, benign    GERD (gastroesophageal reflux disease)    Helicobacter pylori gastritis JUN 2016  EGD/Bx   PREVPAK BID FOR 14 DAYS   Hepatitis    Hepatitis C treated  2011   Hiatal hernia    History of kidney stones    Mixed hyperlipidemia    Neuroma    Prediabetes 06/23/2019   Skin cancer    Substance abuse (HCC)    alcoholic quit 2016   Tobacco abuse     Past Surgical History:  Procedure Laterality Date   ABDOMINAL AORTOGRAM W/LOWER EXTREMITY N/A 06/15/2017   Procedure: ABDOMINAL AORTOGRAM W/LOWER EXTREMITY;  Surgeon: Chuck Hint, MD;  Location: [facility] INVASIVE CV LAB;  Service: Cardiovascular;  Laterality: N/A;  Bilateral   BIOPSY N/A 12/19/2014   Procedure: BIOPSY;  Surgeon: West Bali, MD;  Location: [facility];  Service: Endoscopy;  Laterality: N/A;   COLONOSCOPY  12/2014   [facility]: with endoscopic mucosal resection. 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 Needs colonoscopy in 1 year.    COLONOSCOPY WITH PROPOFOL N/A 12/19/2014   Dr. Darrick Penna: six simple adenomas and 3 hyperplastic polyps. Had flat mid-transverse colon polyp and referred to [facility] for endoscopic mucosal resection, which is scheduled for July 22   COLONOSCOPY WITH PROPOFOL N/A 04/01/2021   Surgeon: Lanelle Bal, DO;Nonbleeding internal hemorrhoids, diverticulosis in the sigmoid colon, tattoo in transverse colon with tattoo site appearing normal.  Recommended repeat colonoscopy in 5 years.   CORONARY  ANGIOPLASTY WITH STENT PLACEMENT  04/11/2013   LAD &  RCA     DR COOPER   ESOPHAGOGASTRODUODENOSCOPY (EGD) WITH PROPOFOL N/A 12/19/2014   Dr. Darrick Penna:  without varices, small hiatal hernia noted, moderate non-erosive gastritis and mild duodenitis.  POSITIVE H.PYLORI. Prescribed Prevpac.   EXCISION MORTON'S NEUROMA Left 08/15/2020   Procedure: EXCISION MORTON'S NEUROMA THIRD INTERSPACE LEFT FOOT;  Surgeon: Candelaria Stagers, DPM;  Location: [facility];  Service: Podiatry;  Laterality: Left;   EXTRACORPOREAL SHOCK WAVE LITHOTRIPSY  2001   FLEXIBLE SIGMOIDOSCOPY N/A  12/23/2017   Procedure: FLEXIBLE SIGMOIDOSCOPY;  Surgeon: West Bali, MD;  Location: [facility];  Service: Endoscopy;  Laterality: N/A;  1:45pm   HAND RECONSTRUCTION Left    HEMORRHOID BANDING N/A 12/23/2017   Procedure: HEMORRHOID BANDING;  Surgeon: West Bali, MD;  Location: [facility];  Service: Endoscopy;  Laterality: N/A;   LEFT HEART CATH AND CORONARY ANGIOGRAPHY N/A 01/22/2021   Procedure: LEFT HEART CATH AND CORONARY ANGIOGRAPHY;  Surgeon: Swaziland, Peter M, MD;  Location: [facility] INVASIVE CV LAB;  Service: Cardiovascular;  Laterality: N/A;   LEFT HEART CATHETERIZATION WITH CORONARY ANGIOGRAM N/A 04/11/2013   Procedure: LEFT HEART CATHETERIZATION WITH CORONARY ANGIOGRAM;  Surgeon: Micheline Chapman, MD;  Location: [facility] CATH LAB;  Service: Cardiovascular;  Laterality: N/A;   LEG TENDON SURGERY Right    LIPOMA EXCISION Left 09/13/2021   Procedure: EXCISION LIPOMA; SHOULDER, 4CM;  Surgeon: Lewie Chamber, DO;  Location: [facility];  Service: General;  Laterality: Left;   MASS EXCISION N/A 09/13/2021   Procedure: EXCISION MASS; BACK, 3CM;  Surgeon: Lewie Chamber, DO;  Location: [facility];  Service: General;  Laterality: N/A;   POLYPECTOMY N/A 12/19/2014   Procedure: POLYPECTOMY;  Surgeon: West Bali, MD;  Location: [facility];  Service: Endoscopy;  Laterality: N/A;    Family History  Problem Relation Age of Onset   Stroke Mother    Aneurysm Mother    Hypertension Mother    Heart disease Father    Hypertension Father    Heart disease Brother    Hypertension Brother    Alcohol abuse Brother    Diabetes Paternal Aunt    Colon cancer Neg Hx     Social History   Socioeconomic History   Marital status: Significant Other    Spouse name: [employer] Old   Number of children: 2   Years of education: 14   Highest education level: Not on file  Occupational History   Occupation: disabled    Comment: heart  Tobacco Use   Smoking status: Former    Average packs/day: 1 pack/day  for 47.4 years (47.4 ttl pk-yrs)    Types: Cigarettes    Start date: 02/06/1975   Smokeless tobacco: Never   Tobacco comments:    5-6 Cigarettes per day  Vaping Use   Vaping status: Never Used  Substance and Sexual Activity   Alcohol use: Yes    Comment: 2-3 beer a couple times a year (10/21/21) Previously 5-6 beer every 2 weeks.   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 18 g 0   amLODipine (NORVASC) 10 MG tablet TAKE 1 TABLET BY MOUTH ONCE A DAY. 90 tablet 1   aspirin EC 81 MG tablet Take 81 mg by mouth at bedtime.      atorvastatin (LIPITOR) 80 MG tablet TAKE ONE TABLET BY MOUTH ONCE DAILY. 90 tablet 1   Budeson-Glycopyrrol-Formoterol (BREZTRI AEROSPHERE) 160-9-4.8 MCG/ACT AERO Inhale 2 puffs into the lungs 2 (two) times daily. 10.7 g 11   chlorthalidone (HYGROTON) 25 MG tablet TAKE 1 TABLET BY MOUTH ONCE DAILY. 90 tablet 1   Cholecalciferol (VITAMIN D3) 2000 units TABS Take 4,000 Units by mouth 2 (two) times daily.     cloNIDine (CATAPRES) 0.1 MG tablet TAKE (1) TABLET BY MOUTH TWICE DAILY. 180 tablet 3   cyclobenzaprine (FLEXERIL) 10 MG tablet Take 1 tablet (10 mg total) by mouth 2 (two) times daily as needed for muscle  spasms. 20 tablet 0   isosorbide mononitrate (IMDUR) 30 MG 24 hr tablet TAKE 1 TABLET BY MOUTH ONCE DAILY. 90 tablet 1   JARDIANCE 10 MG TABS tablet TAKE 1 TABLET BY MOUTH ONCE DAILY BEFORE BREAKFAST. 30 tablet 0   metoprolol succinate (TOPROL-XL) 100 MG 24 hr tablet TAKE ONE TABLET BY MOUTH ONCE DAILY WITH 50MG . TOTAL OF 150MG  DAILY. 90 tablet 2   metoprolol succinate (TOPROL-XL) 50 MG 24 hr tablet TAKE 1 TABLET BY MOUTH WITH OR IMMEDIATELY FOLLOWING A MEAL ONCE A DAY. 90 tablet 1   Multiple Vitamin (MULTIVITAMIN WITH MINERALS) TABS tablet Take 1 tablet by mouth daily.     nitroGLYCERIN (NITROSTAT) 0.4 MG SL tablet Place 1 tablet (0.4 mg total) under the tongue every 5 (five) minutes as needed for chest pain. 25 tablet 3   Omega 3 1200 MG CAPS Take 2,400 mg by mouth daily.     potassium chloride SA (KLOR-CON M) 20 MEQ tablet TAKE ONE TABLET BY MOUTH ONCE DAILY. 90 tablet 3   fenofibrate (TRICOR) 145 MG tablet Take 1 tablet (145 mg total) by mouth daily. (Patient not taking: Reported on 03/04/2023) 30 tablet 5   No facility-administered medications prior to visit.    Allergies  Allergen Reactions   Lisinopril Swelling    Swelling neck   Crestor [Rosuvastatin] Other (See Comments)    Severe muscle aches   Chantix [Varenicline] Other (See Comments)    Abdominal pain   Neosporin [Neomycin-Bacitracin Zn-Polymyx] Rash    blistered    ROS Review of Systems  Constitutional:  Positive for fatigue. Negative for chills and fever.  HENT:  Negative for congestion and sore throat.   Eyes:  Negative for pain and discharge.  Respiratory:  Negative for cough and shortness of breath.   Cardiovascular:  Negative for chest pain and palpitations.  Gastrointestinal:  Negative for diarrhea, nausea and vomiting.  Endocrine: Negative for polydipsia and polyuria.  Genitourinary:  Negative for dysuria and hematuria.  Musculoskeletal:  Positive for arthralgias and back pain. Negative for neck pain and neck  stiffness.  Skin:  Negative for rash.  Neurological:  Negative for weakness and headaches.  Psychiatric/Behavioral:  Negative for agitation and behavioral problems.       Objective:    Physical Exam Vitals reviewed.  Constitutional:      General: He is not in acute distress.    Appearance: He is not diaphoretic.  HENT:     Head: Normocephalic and atraumatic.     Nose: Nose normal.     Mouth/Throat:     Mouth: Mucous membranes are moist.  Eyes:     General: No scleral icterus.    Extraocular Movements: Extraocular movements intact.  Cardiovascular:     Rate and Rhythm: Normal rate and regular rhythm.     Pulses: Normal pulses.     Heart sounds: Normal heart sounds. No murmur heard. Pulmonary:     Breath sounds: Normal breath sounds. No wheezing or rales.  Musculoskeletal:     Cervical back: Neck supple. No tenderness.     Right lower leg: No edema.     Left lower leg: No edema.  Feet:     Right foot:     Toenail Condition: Right toenails are abnormally thick and long. Fungal disease present.    Left foot:     Toenail Condition: Left toenails are abnormally thick and long. Fungal disease present. Skin:    General: Skin is warm.     Findings: No rash.  Neurological:     General: No focal deficit present.     Mental Status: He is alert and oriented to person, place, and time.     Sensory: No sensory deficit.     Motor: No weakness.  Psychiatric:        Mood and Affect: Mood normal.        Behavior: Behavior normal.     BP (!) 142/74 (BP Location: Right Arm)   Pulse 76   Ht 5\' 9"  (1.753 m)   Wt 175 lb 8 oz (79.6 kg)   SpO2 95%   BMI 25.92 kg/m  Wt Readings from Last 3 Encounters:  03/04/23 175 lb 8 oz (79.6 kg)  10/16/22 179 lb 6.4 oz (81.4 kg)  08/27/22 178 lb (80.7 kg)    Lab Results  Component Value Date   TSH 1.390 10/10/2022   Lab Results  Component Value Date   WBC 8.1 10/10/2022   HGB 16.5 10/10/2022   HCT 47.1 10/10/2022   MCV 91 10/10/2022    PLT 220 10/10/2022   Lab Results  Component Value Date   NA 136 02/23/2023   K 3.6 02/23/2023   CO2 21 02/23/2023   GLUCOSE 103 (H) 02/23/2023   BUN 12 02/23/2023   CREATININE 0.77 02/23/2023   BILITOT 0.3 02/23/2023   ALKPHOS 68 02/23/2023   AST 22 02/23/2023   ALT 32 02/23/2023   PROT 7.2 02/23/2023   ALBUMIN 4.8 02/23/2023   CALCIUM 9.8 02/23/2023   ANIONGAP 7 03/19/2021   EGFR 99 02/23/2023   Lab Results  Component Value Date   CHOL 147 02/23/2023   Lab Results  Component Value Date   HDL 45 02/23/2023   Lab Results  Component Value Date   LDLCALC 42 02/23/2023   Lab Results  Component Value Date   TRIG 411 (H) 02/23/2023   Lab Results  Component Value Date   CHOLHDL 3.3 02/23/2023   Lab Results  Component Value Date   HGBA1C 6.1 (H) 02/23/2023      Assessment & Plan:   Problem List Items Addressed This Visit  Cardiovascular and Mediastinum   Essential hypertension    BP Readings from Last 1 Encounters:  03/04/23 (!) 142/74   Elevated today as he has not had his medications today Usually well-controlled with Amlodipine 10 mg QD, Metoprolol 150 mg QD, Chlorthalidone 25 mg QD, Imdur 30 mg QD and Clonidine 0.1 mg BID Counseled for compliance with the medications Advised DASH diet and moderate exercise/walking, at least 150 mins/week      Relevant Medications   VASCEPA 1 g capsule   PAD (peripheral artery disease) (HCC)    On aspirin and statin currently Used to be followed by vascular surgery -also has history of subclavian vein stenosis      Relevant Medications   VASCEPA 1 g capsule   Chronic heart failure with preserved ejection fraction (HCC) - Primary    Appears euvolemic currently On Jardiance Followed by cardiology      Relevant Medications   VASCEPA 1 g capsule     Respiratory   Simple chronic bronchitis (HCC)    Has been seen by Dr. Craige Cotta Was given Symbicort, but did not help much Well-controlled with [facility] as  maintenance inhaler Has albuterol inhaler as needed for dyspnea/wheezing        Endocrine   Diabetes mellitus (HCC)    Lab Results  Component Value Date   HGBA1C 6.1 (H) 02/23/2023   Well-controlled Associated with HTN, CAD and PAD Had diarrhea and presyncope with metformin On Jardiance considering his cardiac history Advised to follow diabetic diet On statin F/u CMP and lipid panel Diabetic eye exam: Advised to follow up with Ophthalmology for diabetic eye exam  Isolated episodes of numbness and tingling of the feet could be due to diabetic neuropathy, would prefer to avoid any medicine for now      Relevant Orders   Urine Microalbumin w/creat. ratio   CMP14+EGFR   Hemoglobin A1c     Musculoskeletal and Integument   Onychomycosis    Has tried Lamisil cream without any relief Started oral Lamisil Advised to contact if he feels palpitations or dizziness - he takes Metoprolol, its effect can be potentiated with Lamisil      Relevant Medications   terbinafine (LAMISIL) 250 MG tablet     Other   Hyperlipidemia    Last lipid profile reviewed On Lipitor currently Has persistently elevated TG, did not tolerate fenofibrate -takes fish oil Added Vascepa for hypertriglyceridemia      Relevant Medications   VASCEPA 1 g capsule   Other Relevant Orders   Lipid Profile   Benign prostatic hyperplasia with nocturia    Reports nocturia, likely due to BPH Prefers to avoid medicine for now Check PSA      Solitary lung nodule    Noted on CT chest (01/24), with repeat CT chest showed stable sized  pulmonary nodule Recheck CT CHEST LCS nodule       Meds ordered this encounter  Medications   VASCEPA 1 g capsule    Sig: Take 2 capsules (2 g total) by mouth 2 (two) times daily.    Dispense:  120 capsule    Refill:  5   terbinafine (LAMISIL) 250 MG tablet    Sig: Take 1 tablet (250 mg total) by mouth daily.    Dispense:  28 tablet    Refill:  2    Follow-up: Return in  about 4 months (around 07/04/2023) for DM and HTN.    Anabel Halon, MD

## 2023-03-04 NOTE — Assessment & Plan Note (Signed)
Has tried Lamisil cream without any relief Started oral Lamisil Advised to contact if he feels palpitations or dizziness - he takes Metoprolol, its effect can be potentiated with Lamisil

## 2023-03-06 LAB — MICROALBUMIN / CREATININE URINE RATIO
Creatinine, Urine: 32.6 mg/dL
Microalb/Creat Ratio: 64 mg/g{creat} — ABNORMAL HIGH (ref 0–29)
Microalbumin, Urine: 20.9 ug/mL

## 2023-03-21 ENCOUNTER — Other Ambulatory Visit: Payer: Self-pay | Admitting: Internal Medicine

## 2023-03-21 ENCOUNTER — Other Ambulatory Visit: Payer: Self-pay | Admitting: Cardiology

## 2023-03-21 DIAGNOSIS — E1169 Type 2 diabetes mellitus with other specified complication: Secondary | ICD-10-CM

## 2023-03-21 DIAGNOSIS — I5032 Chronic diastolic (congestive) heart failure: Secondary | ICD-10-CM

## 2023-03-25 ENCOUNTER — Ambulatory Visit: Payer: Medicare Other | Attending: Cardiology | Admitting: Cardiology

## 2023-03-25 ENCOUNTER — Encounter: Payer: Self-pay | Admitting: Cardiology

## 2023-03-25 VITALS — BP 125/65 | HR 88 | Ht 69.0 in | Wt 175.4 lb

## 2023-03-25 DIAGNOSIS — F17211 Nicotine dependence, cigarettes, in remission: Secondary | ICD-10-CM | POA: Insufficient documentation

## 2023-03-25 DIAGNOSIS — I25118 Atherosclerotic heart disease of native coronary artery with other forms of angina pectoris: Secondary | ICD-10-CM | POA: Insufficient documentation

## 2023-03-25 DIAGNOSIS — Z136 Encounter for screening for cardiovascular disorders: Secondary | ICD-10-CM | POA: Insufficient documentation

## 2023-03-25 NOTE — Progress Notes (Signed)
Clinical Summary Duane Adams is a 67 y.o.male  1. CAD   -  cath 04/11/13 showed significant LAD and RCA disease, s/p DES to both  - echo 03/2013 with normal LVEF   - compliant w/ meds including ASA and plavix   - seen at [facility] for chest pain 08/2016. LHC at that time showed only mild CAD along with patent stents.   - previously lowered imdur to 15mg  daily due to headaches, increased Toprol to 75mg  daily. Has had some increased fatigue, poor sleep. No recent chest pain       01/2021 reported progressive chest pain and DOE - 01/2021 cath: LM normal, LAD patent stent and mid 25%, D1 35%, LCX 40%, ostial RCA 10% and prox 30% -55-65%     - no chest pains, no SOB/DOE - compliant with meds       2. Hyperlipidemia   - bad reaction to crestor, currently on atorvastatin 08/2021 TC 163 TG 262 HDL 45 LDL 75 09/2022 TC 188 TG 438 HDL 49 LDL 71 02/2023 TC 147 TG 411 HDL 45 LDL 42 02/2023 pcp started vascepa   3. HTN   - admit July 2020 with severe HTN, chest pain, stroke like symptoms - home bp's usually 120-160s/80s.  - ACEI alllergy with neck swelling, has not been on ARB due to cross reactivity risk.     - compliant with meds   4. History of GI bleed - followed by GI, from notes thought to be due to internal hemorroids.    5. PAD - followed by vascular - aniography 06/2017 without significant disease.  - left subclavian stenosis. 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   Concussion several yrs ago   no residual   Coronary artery disease    a. 03/2013 Cath/PCI: LM nl, LAD 59m (2.5x20 Promus Premier DES), LCX min irregs, RCA dom, 90p(3.0x20 Promus Premier DES), EF 55-65%. b. patent stents by caths in 08/2016 and 01/2021   CVA (cerebral vascular accident) (HCC) 01/31/2019   slight memory issues   Essential hypertension, benign    GERD  (gastroesophageal reflux disease)    Helicobacter pylori gastritis JUN 2016 EGD/Bx   PREVPAK BID FOR 14 DAYS   Hepatitis    Hepatitis C treated  2011   Hiatal hernia    History of kidney stones    Mixed hyperlipidemia    Neuroma    Prediabetes 06/23/2019   Skin cancer    Substance abuse (HCC)    alcoholic quit 2016   Tobacco abuse      Allergies  Allergen Reactions   Lisinopril Swelling    Swelling neck   Crestor [Rosuvastatin] Other (See Comments)    Severe muscle aches   Chantix [Varenicline] Other (See Comments)    Abdominal pain   Neosporin [Neomycin-Bacitracin Zn-Polymyx] Rash    blistered     Current Outpatient Medications  Medication Sig Dispense Refill   albuterol (VENTOLIN HFA) 108 (90 Base) MCG/ACT inhaler INHALE 2 PUFFS INTO THE LUNGS EVERY 6 HOURS AS NEEDED FOR WHEEZING OR SHORTNESS OF BREATH. 18 g 0   amLODipine (NORVASC) 10 MG tablet TAKE 1 TABLET BY MOUTH ONCE A DAY. 90 tablet 1   aspirin EC 81 MG tablet Take 81 mg by mouth at bedtime.  atorvastatin (LIPITOR) 80 MG tablet TAKE ONE TABLET BY MOUTH ONCE DAILY. 90 tablet 1   Budeson-Glycopyrrol-Formoterol (BREZTRI AEROSPHERE) 160-9-4.8 MCG/ACT AERO Inhale 2 puffs into the lungs 2 (two) times daily. 10.7 g 11   chlorthalidone (HYGROTON) 25 MG tablet TAKE 1 TABLET BY MOUTH ONCE DAILY. 90 tablet 1   Cholecalciferol (VITAMIN D3) 2000 units TABS Take 4,000 Units by mouth 2 (two) times daily.     cloNIDine (CATAPRES) 0.1 MG tablet TAKE (1) TABLET BY MOUTH TWICE DAILY. 180 tablet 3   cyclobenzaprine (FLEXERIL) 10 MG tablet Take 1 tablet (10 mg total) by mouth 2 (two) times daily as needed for muscle spasms. 20 tablet 0   isosorbide mononitrate (IMDUR) 30 MG 24 hr tablet TAKE 1 TABLET BY MOUTH ONCE DAILY. 90 tablet 2   JARDIANCE 10 MG TABS tablet TAKE 1 TABLET BY MOUTH ONCE DAILY BEFORE BREAKFAST. 30 tablet 0   metoprolol succinate (TOPROL-XL) 100 MG 24 hr tablet TAKE ONE TABLET BY MOUTH ONCE DAILY WITH 50MG . TOTAL  OF 150MG  DAILY. 90 tablet 2   metoprolol succinate (TOPROL-XL) 50 MG 24 hr tablet TAKE 1 TABLET BY MOUTH WITH OR IMMEDIATELY FOLLOWING A MEAL ONCE A DAY. 90 tablet 1   Multiple Vitamin (MULTIVITAMIN WITH MINERALS) TABS tablet Take 1 tablet by mouth daily.     nitroGLYCERIN (NITROSTAT) 0.4 MG SL tablet Place 1 tablet (0.4 mg total) under the tongue every 5 (five) minutes as needed for chest pain. 25 tablet 3   Omega 3 1200 MG CAPS Take 2,400 mg by mouth daily.     potassium chloride SA (KLOR-CON M) 20 MEQ tablet TAKE ONE TABLET BY MOUTH ONCE DAILY. 90 tablet 3   terbinafine (LAMISIL) 250 MG tablet Take 1 tablet (250 mg total) by mouth daily. 28 tablet 2   VASCEPA 1 g capsule Take 2 capsules (2 g total) by mouth 2 (two) times daily. 120 capsule 5   No current facility-administered medications for this visit.     Past Surgical History:  Procedure Laterality Date   ABDOMINAL AORTOGRAM W/LOWER EXTREMITY N/A 06/15/2017   Procedure: ABDOMINAL AORTOGRAM W/LOWER EXTREMITY;  Surgeon: Chuck Hint, MD;  Location: [facility] INVASIVE CV LAB;  Service: Cardiovascular;  Laterality: N/A;  Bilateral   BIOPSY N/A 12/19/2014   Procedure: BIOPSY;  Surgeon: West Bali, MD;  Location: [facility] ORS;  Service: Endoscopy;  Laterality: N/A;   COLONOSCOPY  12/2014   [facility]: with endoscopic mucosal resection. 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 Needs colonoscopy in 1 year.    COLONOSCOPY WITH PROPOFOL N/A 12/19/2014   Dr. Darrick Penna: six simple adenomas and 3 hyperplastic polyps. Had flat mid-transverse colon polyp and referred to [facility] for endoscopic mucosal resection, which is scheduled for July 22   COLONOSCOPY WITH PROPOFOL N/A 04/01/2021   Surgeon: Lanelle Bal, DO;Nonbleeding internal hemorrhoids, diverticulosis in the sigmoid colon, tattoo in transverse colon with tattoo site appearing normal.  Recommended repeat colonoscopy in 5 years.   CORONARY ANGIOPLASTY WITH STENT PLACEMENT   04/11/2013   LAD &  RCA     DR COOPER   ESOPHAGOGASTRODUODENOSCOPY (EGD) WITH PROPOFOL N/A 12/19/2014   Dr. Darrick Penna:  without varices, small hiatal hernia noted, moderate non-erosive gastritis and mild duodenitis.  POSITIVE H.PYLORI. Prescribed Prevpac.   EXCISION MORTON'S NEUROMA Left 08/15/2020   Procedure: EXCISION MORTON'S NEUROMA THIRD INTERSPACE LEFT FOOT;  Surgeon: Candelaria Stagers, DPM;  Location: [facility];  Service: Podiatry;  Laterality: Left;  EXTRACORPOREAL SHOCK WAVE LITHOTRIPSY  2001   FLEXIBLE SIGMOIDOSCOPY N/A 12/23/2017   Procedure: FLEXIBLE SIGMOIDOSCOPY;  Surgeon: West Bali, MD;  Location: [facility];  Service: Endoscopy;  Laterality: N/A;  1:45pm   HAND RECONSTRUCTION Left    HEMORRHOID BANDING N/A 12/23/2017   Procedure: HEMORRHOID BANDING;  Surgeon: West Bali, MD;  Location: [facility];  Service: Endoscopy;  Laterality: N/A;   LEFT HEART CATH AND CORONARY ANGIOGRAPHY N/A 01/22/2021   Procedure: LEFT HEART CATH AND CORONARY ANGIOGRAPHY;  Surgeon: Swazil[place], Peter M, MD;  Location: [facility] INVASIVE CV LAB;  Service: Cardiovascular;  Laterality: N/A;   LEFT HEART CATHETERIZATION WITH CORONARY ANGIOGRAM N/A 04/11/2013   Procedure: LEFT HEART CATHETERIZATION WITH CORONARY ANGIOGRAM;  Surgeon: Micheline Chapman, MD;  Location: [facility] CATH LAB;  Service: Cardiovascular;  Laterality: N/A;   LEG TENDON SURGERY Right    LIPOMA EXCISION Left 09/13/2021   Procedure: EXCISION LIPOMA; SHOULDER, 4CM;  Surgeon: Lewie Chamber, DO;  Location: [facility];  Service: General;  Laterality: Left;   MASS EXCISION N/A 09/13/2021   Procedure: EXCISION MASS; BACK, 3CM;  Surgeon: Lewie Chamber, DO;  Location: [facility];  Service: General;  Laterality: N/A;   POLYPECTOMY N/A 12/19/2014   Procedure: POLYPECTOMY;  Surgeon: West Bali, MD;  Location: [facility];  Service: Endoscopy;  Laterality: N/A;     Allergies  Allergen Reactions   Lisinopril Swelling    Swelling  neck   Crestor [Rosuvastatin] Other (See Comments)    Severe muscle aches   Chantix [Varenicline] Other (See Comments)    Abdominal pain   Neosporin [Neomycin-Bacitracin Zn-Polymyx] Rash    blistered      Family History  Problem Relation Age of Onset   Stroke Mother    Aneurysm Mother    Hypertension Mother    Heart disease Father    Hypertension Father    Heart disease Brother    Hypertension Brother    Alcohol abuse Brother    Diabetes Paternal Aunt    Colon cancer Neg Hx      Social History Duane Adams reports that he has quit smoking. His smoking use included cigarettes. He started smoking about 48 years ago. He has a 47.4 pack-year smoking history. He has never used smokeless tobacco. Duane Adams reports current alcohol use.   Review of Systems CONSTITUTIONAL: No weight loss, fever, chills, weakness or fatigue.  HEENT: Eyes: No visual loss, blurred vision, double vision or yellow sclerae.No hearing loss, sneezing, congestion, runny nose or sore throat.  SKIN: No rash or itching.  CARDIOVASCULAR: per hpi RESPIRATORY: No shortness of breath, cough or sputum.  GASTROINTESTINAL: No anorexia, nausea, vomiting or diarrhea. No abdominal pain or blood.  GENITOURINARY: No burning on urination, no polyuria NEUROLOGICAL: No headache, dizziness, syncope, paralysis, ataxia, numbness or tingling in the extremities. No change in bowel or bladder control.  MUSCULOSKELETAL: No muscle, back pain, joint pain or stiffness.  LYMPHATICS: No enlarged nodes. No history of splenectomy.  PSYCHIATRIC: No history of depression or anxiety.  ENDOCRINOLOGIC: No reports of sweating, cold or heat intolerance. No polyuria or polydipsia.  Marland Kitchen   Physical Examination Today's Vitals   03/25/23 1509  BP: 138/80  Pulse: 88  SpO2: 98%  Weight: 175 lb 6.4 oz (79.6 kg)  Height: 5\' 9"  (1.753 m)   Body mass index is 25.9 kg/m.  Gen: resting comfortably, no acute distress HEENT: no scleral icterus,  pupils equal round and reactive, no palptable cervical adenopathy,  CV: RRR, no mrg, no JVD Resp: Clear to auscultation bilaterally GI: abdomen is soft, non-tender, non-distended, normal bowel sounds, no hepatosplenomegaly MSK: extremities are warm, no edema.  Skin: warm, no rash Neuro:  no focal deficits Psych: appropriate affect   Diagnostic Studies   04/11/13 Cath   PROCEDURAL FINDINGS   Hemodynamics:   AO 178/93   LV 177/18   Coronary angiography:   Coronary dominance: right   Left mainstem: Widely patent without obstructive disease   Left anterior descending (LAD): severe mid-vessel stenosis (80%) between the first and second septal perforators. The vessel reaches the LV apex and there is no other significant disease noted. Large D1 is patent.   Left circumflex (LCx): small vessel, supplies 2 OM branches. Mild irregularity noted.   Right coronary artery (RCA): large, dominant vessel. There is severe 90% proximal stenosis. Irregularity in the mid-vessel without significant stenosis. The PDA and PLA branches are both large without significant disease.   Left ventriculography: Left ventricular systolic function is normal, LVEF is estimated at 55-65%, there is no significant mitral regurgitation   PCI Note: Following the diagnostic procedure, the decision was made to proceed with PCI. The patient was loaded with plavix 600 mg. Weight-based bivalirudin was given for anticoagulation. I planned on treating the LAD and RCA, both of which have high-grade disease. Once a therapeutic ACT was achieved, a 6 [place] XB-LAD guide catheter was inserted. A cougar coronary guidewire was used to cross the lesion. The lesion was predilated with a 2.0 mm balloon. The lesion was then stented with a 2.5x20 mm Promus Premier drug-eluting stent. The stent was postdilated with a 2.75 mm noncompliant balloon. Following PCI, there was 0% residual stenosis and TIMI-3 flow. Attention was then turned to the RCA. A JR-4  guide was used. The same cougar guidewire was used to cross the lesion. The lesion was dilated with a 2.0 mm balloon and stented with a 3.0x20 mm Promus Premier DES. The stent was post-dilated to 18 atm with a 3.25 mm [place] balloon. Final angiography confirmed an excellent result. The patient tolerated the procedure well. There were no immediate procedural complications. A TR band was used for radial hemostasis. The patient was transferred to the post catheterization recovery area for further monitoring.   PCI Data:   Lesion 1   Vessel - LAD/Segment - mid   Percent Stenosis (pre) 80   TIMI-flow 3   Stent 2.5x20 mm Promus Premier DES   Percent Stenosis (post) 0   TIMI-flow (post) 3   Lesion 2   Vessel - RCA/Segment - prox   Percent Stenosis (pre) 90   TIMI-flow 3   Stent 3.0x20 mm Promus Premier DES   Percent Stenosis (post) 0   TIMI-flow (post) 3   Final Conclusions:   Severe 2 vessel CAD with successful PCI of the LAD and RCA   Normal LV function   Recommendations:   ASA and plavix for at least 12 months.     04/04/13 Echo   LVEF 60-65%, no WMA, grade I diastolic dysfunction       10/2014 Exercise Nuclear Stress IMPRESSION: 1. No reversible ischemia or infarction. Submaximal stress test, thus diminishing the ability of this test to detect ischemia.   2. Normal left ventricular wall motion.   3. Left ventricular ejection fraction 78%   4. Low-risk stress test findings*.  Consider Lexiscan in the future.     08/2016 [facility] cath Angiography: LVgram: not performed LCA: LAD stent patent, mild luminal  irregularites, no significant obstructive disease RCA: proximal stent patent, large R dominant RCA,mild luminal irregularities in distal Duane Adams  Impression:   Mild CAD with Patent Stents RCA and LAD Normal resting LVedp (14 mm)  Plan:  Follow-up with referring physician. Echocardiogram suggested to evaluate possible HFrEF as cause for symptoms  Assessment and Plan   1. CAD with  stable angina - most recent cath showed no significant disease - no recent symtoms, prior angina contolled with medical therapy.  -EKG today shows NSR, no ischemic changes   2. Hyperlipidemia - LDL at goal, pcp just started vascepa to bring down TGs, Discussed dietary changes as well     3.HTN - at goal, continue current med  AAA screen: male over 23 former smoker, needs AAA screen   Antoine Poche, M.D.

## 2023-03-25 NOTE — Patient Instructions (Signed)
Medication Instructions:  Your physician recommends that you continue on your current medications as directed. Please refer to the Current Medication list given to you today.  *If you need a refill on your cardiac medications before your next appointment, please call your pharmacy*   Lab Work: None If you have labs (blood work) drawn today and your tests are completely normal, you will receive your results only by: MyChart Message (if you have MyChart) OR A paper copy in the mail If you have any lab test that is abnormal or we need to change your treatment, we will call you to review the results.   Testing/Procedures: Abdominal Aorta Aneurysm Ultrasound   Follow-Up: At [facility], you and your health needs are our priority.  As part of our continuing mission to provide you with exceptional heart care, we have created designated Provider Care Teams.  These Care Teams include your primary Cardiologist (physician) and Advanced Practice Providers (APPs -  Physician Assistants and Nurse Practitioners) who all work together to provide you with the care you need, when you need it.  We recommend signing up for the patient portal called "MyChart".  Sign up information is provided on this After Visit Summary.  MyChart is used to connect with patients for Virtual Visits (Telemedicine).  Patients are able to view lab/test results, encounter notes, upcoming appointments, etc.  Non-urgent messages can be sent to your provider as well.   To learn more about what you can do with MyChart, go to [email or web address].    Your next appointment:   6 month(s)  Provider:   You may see Dina Rich, MD or one of the following Advanced Practice Providers on your designated Care Team:   Randall An, PA-C  Jacolyn Reedy, [place]     Other Instructions

## 2023-03-30 ENCOUNTER — Other Ambulatory Visit: Payer: Self-pay

## 2023-03-30 DIAGNOSIS — J410 Simple chronic bronchitis: Secondary | ICD-10-CM

## 2023-03-30 MED ORDER — ALBUTEROL SULFATE HFA 108 (90 BASE) MCG/ACT IN AERS: 2.0000 | INHALATION_SPRAY | Freq: Four times a day (QID) | RESPIRATORY_TRACT | 0 refills | Status: DC | PRN

## 2023-04-01 ENCOUNTER — Other Ambulatory Visit: Payer: Self-pay | Admitting: Cardiology

## 2023-04-01 ENCOUNTER — Ambulatory Visit (HOSPITAL_COMMUNITY)
Admission: RE | Admit: 2023-04-01 | Discharge: 2023-04-01 | Disposition: A | Discharge status: Home / Self Care | Payer: Medicare Other | Source: Ambulatory Visit | Attending: Cardiology | Admitting: Cardiology

## 2023-04-01 DIAGNOSIS — I77811 Abdominal aortic ectasia: Secondary | ICD-10-CM | POA: Insufficient documentation

## 2023-04-01 DIAGNOSIS — Z136 Encounter for screening for cardiovascular disorders: Secondary | ICD-10-CM | POA: Insufficient documentation

## 2023-04-01 DIAGNOSIS — F17211 Nicotine dependence, cigarettes, in remission: Secondary | ICD-10-CM | POA: Insufficient documentation

## 2023-04-01 DIAGNOSIS — I25118 Atherosclerotic heart disease of native coronary artery with other forms of angina pectoris: Secondary | ICD-10-CM | POA: Diagnosis present

## 2023-04-22 ENCOUNTER — Other Ambulatory Visit: Payer: Self-pay | Admitting: Internal Medicine

## 2023-04-22 DIAGNOSIS — E1169 Type 2 diabetes mellitus with other specified complication: Secondary | ICD-10-CM

## 2023-04-22 DIAGNOSIS — I5032 Chronic diastolic (congestive) heart failure: Secondary | ICD-10-CM

## 2023-05-01 ENCOUNTER — Other Ambulatory Visit: Payer: Self-pay | Admitting: Emergency Medicine

## 2023-05-01 DIAGNOSIS — Z122 Encounter for screening for malignant neoplasm of respiratory organs: Secondary | ICD-10-CM

## 2023-05-01 DIAGNOSIS — F17210 Nicotine dependence, cigarettes, uncomplicated: Secondary | ICD-10-CM

## 2023-05-01 DIAGNOSIS — Z87891 Personal history of nicotine dependence: Secondary | ICD-10-CM

## 2023-05-05 ENCOUNTER — Other Ambulatory Visit: Payer: Self-pay | Admitting: Cardiology

## 2023-05-06 ENCOUNTER — Other Ambulatory Visit: Payer: Self-pay

## 2023-05-06 DIAGNOSIS — J410 Simple chronic bronchitis: Secondary | ICD-10-CM

## 2023-05-06 MED ORDER — ALBUTEROL SULFATE HFA 108 (90 BASE) MCG/ACT IN AERS: 2.0000 | INHALATION_SPRAY | Freq: Four times a day (QID) | RESPIRATORY_TRACT | 0 refills | Status: DC | PRN

## 2023-05-21 ENCOUNTER — Other Ambulatory Visit: Payer: Self-pay | Admitting: Cardiology

## 2023-05-21 ENCOUNTER — Other Ambulatory Visit: Payer: Self-pay | Admitting: Internal Medicine

## 2023-05-21 DIAGNOSIS — E1169 Type 2 diabetes mellitus with other specified complication: Secondary | ICD-10-CM

## 2023-05-21 DIAGNOSIS — I5032 Chronic diastolic (congestive) heart failure: Secondary | ICD-10-CM

## 2023-05-23 ENCOUNTER — Other Ambulatory Visit: Payer: Self-pay | Admitting: Internal Medicine

## 2023-05-23 DIAGNOSIS — B351 Tinea unguium: Secondary | ICD-10-CM

## 2023-06-19 ENCOUNTER — Other Ambulatory Visit: Payer: Self-pay | Admitting: Cardiology

## 2023-06-23 ENCOUNTER — Other Ambulatory Visit: Payer: Self-pay

## 2023-06-23 DIAGNOSIS — I5032 Chronic diastolic (congestive) heart failure: Secondary | ICD-10-CM

## 2023-06-23 DIAGNOSIS — E1169 Type 2 diabetes mellitus with other specified complication: Secondary | ICD-10-CM

## 2023-06-23 MED ORDER — EMPAGLIFLOZIN 10 MG PO TABS: 10.0000 mg | ORAL_TABLET | Freq: Every day | ORAL | 0 refills | Status: DC

## 2023-07-03 ENCOUNTER — Ambulatory Visit: Payer: Medicare Other | Admitting: Internal Medicine

## 2023-07-16 ENCOUNTER — Other Ambulatory Visit: Payer: Self-pay

## 2023-07-16 MED ORDER — METOPROLOL SUCCINATE ER 50 MG PO TB24: 50.0000 mg | ORAL_TABLET | Freq: Every day | ORAL | 1 refills | Status: DC

## 2023-07-27 ENCOUNTER — Other Ambulatory Visit: Payer: Self-pay | Admitting: Internal Medicine

## 2023-07-27 DIAGNOSIS — J410 Simple chronic bronchitis: Secondary | ICD-10-CM

## 2023-07-29 ENCOUNTER — Other Ambulatory Visit: Payer: Self-pay | Admitting: Internal Medicine

## 2023-07-29 DIAGNOSIS — E1169 Type 2 diabetes mellitus with other specified complication: Secondary | ICD-10-CM

## 2023-07-29 DIAGNOSIS — I5032 Chronic diastolic (congestive) heart failure: Secondary | ICD-10-CM

## 2023-08-13 ENCOUNTER — Other Ambulatory Visit: Payer: Self-pay | Admitting: Internal Medicine

## 2023-08-13 DIAGNOSIS — E1169 Type 2 diabetes mellitus with other specified complication: Secondary | ICD-10-CM

## 2023-08-13 DIAGNOSIS — I5032 Chronic diastolic (congestive) heart failure: Secondary | ICD-10-CM

## 2023-08-28 ENCOUNTER — Other Ambulatory Visit: Payer: Self-pay | Admitting: Internal Medicine

## 2023-08-28 DIAGNOSIS — J410 Simple chronic bronchitis: Secondary | ICD-10-CM

## 2023-10-02 ENCOUNTER — Encounter: Payer: Self-pay | Admitting: *Deleted

## 2023-10-04 ENCOUNTER — Other Ambulatory Visit: Payer: Self-pay | Admitting: Cardiology

## 2023-10-05 ENCOUNTER — Ambulatory Visit: Payer: Medicare Other | Admitting: Cardiology

## 2023-10-06 ENCOUNTER — Ambulatory Visit: Payer: Medicare Other

## 2023-10-17 ENCOUNTER — Other Ambulatory Visit: Payer: Self-pay | Admitting: Student

## 2023-10-20 ENCOUNTER — Other Ambulatory Visit: Payer: Self-pay | Admitting: Internal Medicine

## 2023-10-20 DIAGNOSIS — J410 Simple chronic bronchitis: Secondary | ICD-10-CM

## 2023-11-11 ENCOUNTER — Encounter: Payer: Self-pay | Admitting: Internal Medicine

## 2023-11-11 ENCOUNTER — Ambulatory Visit: Payer: Self-pay | Admitting: Internal Medicine

## 2023-11-11 VITALS — BP 138/78 | HR 75 | Ht 69.0 in | Wt 182.8 lb

## 2023-11-11 DIAGNOSIS — Z7984 Long term (current) use of oral hypoglycemic drugs: Secondary | ICD-10-CM

## 2023-11-11 DIAGNOSIS — M51362 Other intervertebral disc degeneration, lumbar region with discogenic back pain and lower extremity pain: Secondary | ICD-10-CM

## 2023-11-11 DIAGNOSIS — R5382 Chronic fatigue, unspecified: Secondary | ICD-10-CM

## 2023-11-11 DIAGNOSIS — E538 Deficiency of other specified B group vitamins: Secondary | ICD-10-CM

## 2023-11-11 DIAGNOSIS — E559 Vitamin D deficiency, unspecified: Secondary | ICD-10-CM

## 2023-11-11 DIAGNOSIS — I5032 Chronic diastolic (congestive) heart failure: Secondary | ICD-10-CM | POA: Diagnosis not present

## 2023-11-11 DIAGNOSIS — J410 Simple chronic bronchitis: Secondary | ICD-10-CM

## 2023-11-11 DIAGNOSIS — E1169 Type 2 diabetes mellitus with other specified complication: Secondary | ICD-10-CM

## 2023-11-11 DIAGNOSIS — I10 Essential (primary) hypertension: Secondary | ICD-10-CM

## 2023-11-11 DIAGNOSIS — E782 Mixed hyperlipidemia: Secondary | ICD-10-CM

## 2023-11-11 DIAGNOSIS — I739 Peripheral vascular disease, unspecified: Secondary | ICD-10-CM

## 2023-11-11 MED ORDER — CYCLOBENZAPRINE HCL 10 MG PO TABS
10.0000 mg | ORAL_TABLET | Freq: Every day | ORAL | 3 refills | Status: DC
Start: 2023-11-11 — End: 2024-03-15

## 2023-11-11 NOTE — Assessment & Plan Note (Signed)
Appears euvolemic currently On Jardiance Followed by cardiology 

## 2023-11-11 NOTE — Assessment & Plan Note (Signed)
 BP Readings from Last 1 Encounters:  11/11/23 138/78   Elevated today as he has not had his medications today Usually well-controlled with Amlodipine  10 mg QD, Metoprolol  150 mg QD, Chlorthalidone  25 mg QD, Imdur  30 mg QD and Clonidine  0.1 mg BID Counseled for compliance with the medications Advised DASH diet and moderate exercise/walking, at least 150 mins/week

## 2023-11-11 NOTE — Assessment & Plan Note (Signed)
 Likely multifactorial in the setting of COPD, CHF, type II DM and multiple medications Check CBC, CMP, HbA1c, vitamin D  and B12 TSH has been WNL in the past Maintain adequate hydration and eat at regular intervals

## 2023-11-11 NOTE — Progress Notes (Signed)
 Established Patient Office Visit  Subjective:  Patient ID: Duane Adams, male    DOB: May 12, 1956  Age: 68 y.o. MRN: [id number]  CC:  Chief Complaint  Patient presents with   Medical Management of Chronic Issues    Follow up, reports back pain has worsened, also having leg pain. Has been having pain on his right heel.     HPI Duane Adams is a 68 y.o. male with past medical history of HTN, CAD s/p stent placement, PAD, type 2 DM, HLD, COPD, GERD, liver fibrosis, chronic low back pain and tobacco abuse who presents for f/u of his chronic medical conditions.  Last seen in 08/24.  CAD, PAD and HTN: BP is well-controlled. Takes medications regularly. Patient denies headache, dizziness, chest pain, or palpitations.  He follows up with cardiology for history of CAD.  He also saw vascular surgery for history of subclavian vein stenosis.  He currently takes Lipitor 80 mg QD for HLD.  He was given fenofibrate  for hypertriglyceridemia, but had severe GI discomfort with it.  He still takes fish oil.  Type II DM: His last HbA1c was 6.1 in 08/24. He has been tolerating Jardiance  well.  He denies any polyuria, polyphagia or chronic fatigue.  COPD: He has noticed improvement in dyspnea since using Breztri  and has needed albuterol  less frequently.  Solitary pulmonary nodule: He was found to have suspicious pulmonary nodule on CT chest in 07/24, which was stable compared to previous CT chest.  He currently denies any chronic cough, hemoptysis, recent weight loss, night sweats or LAD.  Chronic low back pain: He reports chronic low back pain, worse with heavy lifting and bending.  His pain is constant, sharp, radiating towards bilateral LE and is associated with numbness of bilateral thigh area and vibrating sensation over right heel area.  Denies any recent injury or fall.  He has been evaluated by [facility] in the past, had steroid injections with mild relief.     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   Concussion several yrs ago   no residual   Coronary artery disease    a. 03/2013 Cath/PCI: LM nl, LAD 28m (2.5x20 Promus Premier DES), LCX min irregs, RCA dom, 90p(3.0x20 Promus Premier DES), EF 55-65%. b. patent stents by caths in 08/2016 and 01/2021   CVA (cerebral vascular accident) (HCC) 01/31/2019   slight memory issues   Essential hypertension, benign    GERD (gastroesophageal reflux disease)    Helicobacter pylori gastritis JUN 2016 EGD/Bx   PREVPAK BID FOR 14 DAYS   Hepatitis    Hepatitis C treated  2011   Hiatal hernia    History of kidney stones    Mixed hyperlipidemia    Neuroma    Prediabetes 06/23/2019   Skin cancer    Substance abuse (HCC)    alcoholic quit 2016   Tobacco abuse     Past Surgical History:  Procedure Laterality Date   ABDOMINAL AORTOGRAM W/LOWER EXTREMITY N/A 06/15/2017   Procedure: ABDOMINAL AORTOGRAM W/LOWER EXTREMITY;  Surgeon: Dannis Dy, MD;  Location: [facility] INVASIVE CV LAB;  Service: Cardiovascular;  Laterality: N/A;  Bilateral   BIOPSY N/A 12/19/2014   Procedure: BIOPSY;  Surgeon: Alyce Jubilee, MD;  Location: [facility];  Service: Endoscopy;  Laterality: N/A;   COLONOSCOPY  12/2014   [facility]: with endoscopic mucosal  resection. 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 Needs colonoscopy in 1 year.    COLONOSCOPY WITH PROPOFOL  N/A 12/19/2014   Dr. Nolene Baumgarten: six simple adenomas and 3 hyperplastic polyps. Had flat mid-transverse colon polyp and referred to [facility] for endoscopic mucosal resection, which is scheduled for July 22   COLONOSCOPY WITH PROPOFOL  N/A 04/01/2021   Surgeon: Vinetta Greening, DO;Nonbleeding internal hemorrhoids, diverticulosis in the sigmoid colon, tattoo in transverse colon with tattoo site appearing normal.  Recommended repeat colonoscopy in 5 years.   CORONARY ANGIOPLASTY WITH  STENT PLACEMENT  04/11/2013   LAD &  RCA     DR COOPER   ESOPHAGOGASTRODUODENOSCOPY (EGD) WITH PROPOFOL  N/A 12/19/2014   Dr. Nolene Baumgarten:  without varices, small hiatal hernia noted, moderate non-erosive gastritis and mild duodenitis.  POSITIVE H.PYLORI. Prescribed Prevpac.   EXCISION MORTON'S NEUROMA Left 08/15/2020   Procedure: EXCISION MORTON'S NEUROMA THIRD INTERSPACE LEFT FOOT;  Surgeon: Velma Ghazi, DPM;  Location: [facility];  Service: Podiatry;  Laterality: Left;   EXTRACORPOREAL SHOCK WAVE LITHOTRIPSY  2001   FLEXIBLE SIGMOIDOSCOPY N/A 12/23/2017   Procedure: FLEXIBLE SIGMOIDOSCOPY;  Surgeon: Alyce Jubilee, MD;  [facility];  Service: Endoscopy;  Laterality: N/A;  1:45pm   HAND RECONSTRUCTION Left    HEMORRHOID BANDING N/A 12/23/2017   Procedure: HEMORRHOID BANDING;  Surgeon: Alyce Jubilee, MD;  [facility]: [facility];  Service: Endoscopy;  Laterality: N/A;   LEFT HEART CATH AND CORONARY ANGIOGRAPHY N/A 01/22/2021   Procedure: LEFT HEART CATH AND CORONARY ANGIOGRAPHY;  Surgeon: Swaziland, Peter M, MD;  Location: [facility] INVASIVE CV LAB;  Service: Cardiovascular;  Laterality: N/A;   LEFT HEART CATHETERIZATION WITH CORONARY ANGIOGRAM N/A 04/11/2013   Procedure: LEFT HEART CATHETERIZATION WITH CORONARY ANGIOGRAM;  Surgeon: Arlander Bellman, MD;  Location: [facility] CATH LAB;  Service: Cardiovascular;  Laterality: N/A;   LEG TENDON SURGERY Right    LIPOMA EXCISION Left 09/13/2021   Procedure: EXCISION LIPOMA; SHOULDER, 4CM;  Surgeon: Marijo Shove, DO;  Location: [facility];  Service: General;  Laterality: Left;   MASS EXCISION N/A 09/13/2021   Procedure: EXCISION MASS; BACK, 3CM;  Surgeon: Marijo Shove, DO;  Location: [facility];  Service: General;  Laterality: N/A;   POLYPECTOMY N/A 12/19/2014   Procedure: POLYPECTOMY;  Surgeon: Alyce Jubilee, MD;  Location: [facility];  Service: Endoscopy;  Laterality: N/A;    Family History  Problem Relation Age of Onset    Stroke Mother    Aneurysm Mother    Hypertension Mother    Heart disease Father    Hypertension Father    Heart disease Brother    Hypertension Brother    Alcohol abuse Brother    Diabetes Paternal Aunt    Colon cancer Neg Hx     Social History   Socioeconomic History   Marital status: Significant Other    Spouse name: Earlene Gleason   Number of children: 2   Years of education: 14   Highest education level: Not on file  Occupational History   Occupation: disabled    Comment: heart  Tobacco Use   Smoking status: Every Day    Current packs/day: 1.00    Average packs/day: 1 pack/day for 48.8 years (48.8 ttl pk-yrs)    Types: Cigarettes    Start date: 02/06/1975   Smokeless tobacco: Never   Tobacco comments:    5-6 Cigarettes per day  Vaping Use   Vaping status: Never Used  Substance and  Sexual Activity   Alcohol use: Yes    Comment: 2-3 beer a couple times a year (10/21/21) Previously 5-6 beer every 2 weeks.   Drug use: Not Currently    Types: Cocaine, Marijuana    Comment: cocaine marijuana and acid last used 1984   Sexual activity: Not Currently  Other Topics Concern   Not on file  Social History Narrative   Disabled from heart disease   Previously worked in Engineer, building services with Administrator, arts for 28 years   Leisure: Animator   Social Drivers of Corporate investment banker Strain: Not on file  Food Insecurity: Not on file  Transportation Needs: Not on file  Physical Activity: Not on file  Stress: Not on file  Social Connections: Not on file  Intimate Partner Violence: Not on file    Outpatient Medications Prior to Visit  Medication Sig Dispense Refill   amLODipine  (NORVASC ) 10 MG tablet TAKE 1 TABLET BY MOUTH ONCE A DAY. 90 tablet 1   aspirin  EC 81 MG tablet Take 81 mg by mouth at bedtime.      atorvastatin  (LIPITOR) 80 MG tablet TAKE ONE TABLET BY MOUTH ONCE DAILY. 90 tablet 1   BREZTRI  AEROSPHERE 160-9-4.8 MCG/ACT AERO INHALE 2 PUFFS INTO THE LUNGS 2 TIMES DAILY. 10.7  g 11   chlorthalidone  (HYGROTON ) 25 MG tablet TAKE 1 TABLET BY MOUTH ONCE DAILY. 90 tablet 1   Cholecalciferol (VITAMIN D3) 2000 units TABS Take 4,000 Units by mouth 2 (two) times daily.     cloNIDine  (CATAPRES ) 0.1 MG tablet TAKE (1) TABLET BY MOUTH TWICE DAILY. 180 tablet 3   empagliflozin  (JARDIANCE ) 10 MG TABS tablet Take 1 tablet (10 mg total) by mouth daily. 90 tablet 3   isosorbide  mononitrate (IMDUR ) 30 MG 24 hr tablet TAKE 1 TABLET BY MOUTH ONCE DAILY. 90 tablet 2   metoprolol  succinate (TOPROL -XL) 100 MG 24 hr tablet TAKE ONE TABLET BY MOUTH ONCE DAILY WITH 50MG . TOTAL OF 150MG  DAILY. 90 tablet 2   metoprolol  succinate (TOPROL -XL) 50 MG 24 hr tablet Take 1 tablet (50 mg total) by mouth daily. Take with or immediately following a meal. 90 tablet 1   Multiple Vitamin (MULTIVITAMIN WITH MINERALS) TABS tablet Take 1 tablet by mouth daily.     nitroGLYCERIN  (NITROSTAT ) 0.4 MG SL tablet Place 1 tablet (0.4 mg total) under the tongue every 5 (five) minutes as needed for chest pain. 25 tablet 3   Omega 3 1200 MG CAPS Take 2,400 mg by mouth daily.     potassium chloride  SA (KLOR-CON  M) 20 MEQ tablet TAKE ONE TABLET BY MOUTH ONCE DAILY. 90 tablet 3   VASCEPA  1 g capsule Take 2 capsules (2 g total) by mouth 2 (two) times daily. 120 capsule 5   VENTOLIN  HFA 108 (90 Base) MCG/ACT inhaler Inhale 2 puffs into the lungs every 6 (six) hours as needed for wheezing or shortness of breath. 18 g 0   cyclobenzaprine  (FLEXERIL ) 10 MG tablet Take 1 tablet (10 mg total) by mouth 2 (two) times daily as needed for muscle spasms. 20 tablet 0   terbinafine  (LAMISIL ) 250 MG tablet TAKE ONE TABLET BY MOUTH ONCE DAILY. 28 tablet 2   No facility-administered medications prior to visit.    Allergies  Allergen Reactions   Lisinopril Swelling    Swelling neck   Crestor [Rosuvastatin] Other (See Comments)    Severe muscle aches   Chantix  [Varenicline ] Other (See Comments)    Abdominal pain  Neosporin  [Neomycin-Bacitracin Zn-Polymyx] Rash    blistered    ROS Review of Systems  Constitutional:  Positive for fatigue. Negative for chills and fever.  HENT:  Negative for congestion and sore throat.   Eyes:  Negative for pain and discharge.  Respiratory:  Negative for cough and shortness of breath.   Cardiovascular:  Negative for chest pain and palpitations.  Gastrointestinal:  Negative for diarrhea, nausea and vomiting.  Endocrine: Negative for polydipsia and polyuria.  Genitourinary:  Negative for dysuria and hematuria.  Musculoskeletal:  Positive for arthralgias and back pain. Negative for neck pain and neck stiffness.  Skin:  Negative for rash.  Neurological:  Negative for weakness and headaches.  Psychiatric/Behavioral:  Negative for agitation and behavioral problems.       Objective:    Physical Exam Vitals reviewed.  Constitutional:      General: He is not in acute distress.    Appearance: He is not diaphoretic.  HENT:     Head: Normocephalic and atraumatic.     Nose: Nose normal.     Mouth/Throat:     Mouth: Mucous membranes are moist.  Eyes:     General: No scleral icterus.    Extraocular Movements: Extraocular movements intact.  Cardiovascular:     Rate and Rhythm: Normal rate and regular rhythm.     Heart sounds: Normal heart sounds. No murmur heard. Pulmonary:     Breath sounds: Normal breath sounds. No wheezing or rales.  Musculoskeletal:     Cervical back: Neck supple. No tenderness.     Lumbar back: Tenderness present. Decreased range of motion.     Right lower leg: No edema.     Left lower leg: No edema.  Skin:    General: Skin is warm.     Findings: No rash.  Neurological:     General: No focal deficit present.     Mental Status: He is alert and oriented to person, place, and time.     Sensory: No sensory deficit.     Motor: No weakness.  Psychiatric:        Mood and Affect: Mood normal.        Behavior: Behavior normal.     BP 138/78 (BP  Location: Left Arm)   Pulse 75   Ht 5\' 9"  (1.753 m)   Wt 182 lb 12.8 oz (82.9 kg)   SpO2 95%   BMI 26.99 kg/m  Wt Readings from Last 3 Encounters:  11/11/23 182 lb 12.8 oz (82.9 kg)  03/25/23 175 lb 6.4 oz (79.6 kg)  03/04/23 175 lb 8 oz (79.6 kg)    Lab Results  Component Value Date   TSH 1.390 10/10/2022   Lab Results  Component Value Date   WBC 8.1 10/10/2022   HGB 16.5 10/10/2022   HCT 47.1 10/10/2022   MCV 91 10/10/2022   PLT 220 10/10/2022   Lab Results  Component Value Date   NA 136 02/23/2023   K 3.6 02/23/2023   CO2 21 02/23/2023   GLUCOSE 103 (H) 02/23/2023   BUN 12 02/23/2023   CREATININE 0.77 02/23/2023   BILITOT 0.3 02/23/2023   ALKPHOS 68 02/23/2023   AST 22 02/23/2023   ALT 32 02/23/2023   PROT 7.2 02/23/2023   ALBUMIN 4.8 02/23/2023   CALCIUM  9.8 02/23/2023   ANIONGAP 7 03/19/2021   EGFR 99 02/23/2023   Lab Results  Component Value Date   CHOL 147 02/23/2023   Lab Results  Component Value Date  HDL 45 02/23/2023   Lab Results  Component Value Date   LDLCALC 42 02/23/2023   Lab Results  Component Value Date   TRIG 411 (H) 02/23/2023   Lab Results  Component Value Date   CHOLHDL 3.3 02/23/2023   Lab Results  Component Value Date   HGBA1C 6.1 (H) 02/23/2023      Assessment & Plan:   Problem List Items Addressed This Visit       Cardiovascular and Mediastinum   Essential hypertension   BP Readings from Last 1 Encounters:  11/11/23 138/78   Elevated today as he has not had his medications today Usually well-controlled with Amlodipine  10 mg QD, Metoprolol  150 mg QD, Chlorthalidone  25 mg QD, Imdur  30 mg QD and Clonidine  0.1 mg BID Counseled for compliance with the medications Advised DASH diet and moderate exercise/walking, at least 150 mins/week      Relevant Orders   CBC with Differential/Platelet   CMP14+EGFR   PAD (peripheral artery disease) (HCC)   On aspirin  and statin currently Used to be followed by vascular  surgery -also has history of subclavian vein stenosis      Chronic heart failure with preserved ejection fraction (HCC)   Appears euvolemic currently On Jardiance  Followed by cardiology        Respiratory   Simple chronic bronchitis (HCC)   Has been seen by Dr. Matilde Son Was given Symbicort , but did not help much Well-controlled with Breztri  as maintenance inhaler Has albuterol  inhaler as needed for dyspnea/wheezing        Endocrine   Type 2 Diabetes mellitus with other specified complication   Lab Results  Component Value Date   HGBA1C 6.1 (H) 02/23/2023   Well-controlled Associated with HTN, CAD and PAD Had diarrhea and presyncope with metformin  On Jardiance  considering his cardiac history Advised to follow diabetic diet On statin F/u CMP and lipid panel Diabetic eye exam: Advised to follow up with Ophthalmology for diabetic eye exam  Isolated episodes of numbness and tingling of the feet could be due to diabetic neuropathy, he prefers to avoid any medicine for now      Relevant Orders   Hemoglobin A1c   Urine Microalbumin w/creat. ratio     Musculoskeletal and Integument   DDD (degenerative disc disease), lumbar - Primary   Complains of low back and hip pain Has been seen by Dr. Phyllis Breeze, and also has completed PT with no relief Referred to spine surgery Avoid heavy lifting and frequent bending Heating pad as needed Flexeril  as needed for muscle spasms If persistent, can consider gabapentin if he agrees Tylenol  as needed for now      Relevant Medications   cyclobenzaprine  (FLEXERIL ) 10 MG tablet   Other Relevant Orders   Ambulatory referral to Spine Surgery     Other   Hyperlipidemia   Last lipid profile reviewed On Lipitor currently Has persistently elevated TG, did not tolerate fenofibrate  -takes fish oil On Vascepa  for hypertriglyceridemia      Relevant Orders   Lipid Profile   Vitamin D  deficiency   Relevant Orders   Vitamin D  (25 hydroxy)    Chronic fatigue   Likely multifactorial in the setting of COPD, CHF, type II DM and multiple medications Check CBC, CMP, HbA1c, vitamin D  and B12 TSH has been WNL in the past Maintain adequate hydration and eat at regular intervals      Other Visit Diagnoses       B12 deficiency  Relevant Orders   B12        Meds ordered this encounter  Medications   cyclobenzaprine  (FLEXERIL ) 10 MG tablet    Sig: Take 1 tablet (10 mg total) by mouth at bedtime.    Dispense:  30 tablet    Refill:  3    Follow-up: Return in about 4 months (around 03/12/2024) for DM and HTN.    Meldon Sport, MD

## 2023-11-11 NOTE — Assessment & Plan Note (Addendum)
 Lab Results  Component Value Date   HGBA1C 6.1 (H) 02/23/2023   Well-controlled Associated with HTN, CAD and PAD Had diarrhea and presyncope with metformin  On Jardiance  considering his cardiac history Advised to follow diabetic diet On statin F/u CMP and lipid panel Diabetic eye exam: Advised to follow up with Ophthalmology for diabetic eye exam  Isolated episodes of numbness and tingling of the feet could be due to diabetic neuropathy, he prefers to avoid any medicine for now

## 2023-11-11 NOTE — Assessment & Plan Note (Signed)
On aspirin and statin currently Used to be followed by vascular surgery -also has history of subclavian vein stenosis

## 2023-11-11 NOTE — Assessment & Plan Note (Addendum)
 Complains of low back and hip pain Has been seen by Dr. Phyllis Breeze, and also has completed PT with no relief Referred to spine surgery Avoid heavy lifting and frequent bending Heating pad as needed Flexeril  as needed for muscle spasms If persistent, can consider gabapentin if he agrees Tylenol  as needed for now

## 2023-11-11 NOTE — Patient Instructions (Signed)
 You are being referred to [facility].  [address], [place], [place] [address] Phone: [phone]  Please start taking Cyclobenzaprine  at nighttime for back muscle spasms/stiffness.  Please continue to take other medications as prescribed.  Please continue to follow low salt diet and perform moderate exercise/walking as tolerated.

## 2023-11-11 NOTE — Assessment & Plan Note (Signed)
Has been seen by Dr. Craige Cotta Was given Symbicort, but did not help much Well-controlled with [facility] as maintenance inhaler Has albuterol inhaler as needed for dyspnea/wheezing

## 2023-11-11 NOTE — Assessment & Plan Note (Signed)
 Last lipid profile reviewed On Lipitor currently Has persistently elevated TG, did not tolerate fenofibrate  -takes fish oil On Vascepa  for hypertriglyceridemia

## 2023-11-12 ENCOUNTER — Other Ambulatory Visit: Payer: Self-pay | Admitting: Internal Medicine

## 2023-11-12 DIAGNOSIS — E782 Mixed hyperlipidemia: Secondary | ICD-10-CM

## 2023-11-12 LAB — CMP14+EGFR
ALT: 31 IU/L (ref 0–44)
AST: 17 IU/L (ref 0–40)
Albumin: 4.8 g/dL (ref 3.9–4.9)
Alkaline Phosphatase: 61 IU/L (ref 44–121)
BUN/Creatinine Ratio: 20 (ref 10–24)
BUN: 14 mg/dL (ref 8–27)
Bilirubin Total: 0.3 mg/dL (ref 0.0–1.2)
CO2: 23 mmol/L (ref 20–29)
Calcium: 9.9 mg/dL (ref 8.6–10.2)
Chloride: 102 mmol/L (ref 96–106)
Creatinine, Ser: 0.71 mg/dL — ABNORMAL LOW (ref 0.76–1.27)
Globulin, Total: 2.5 g/dL (ref 1.5–4.5)
Glucose: 96 mg/dL (ref 70–99)
Potassium: 4.4 mmol/L (ref 3.5–5.2)
Sodium: 144 mmol/L (ref 134–144)
Total Protein: 7.3 g/dL (ref 6.0–8.5)
eGFR: 101 mL/min/{1.73_m2} (ref >59)

## 2023-11-12 LAB — CBC WITH DIFFERENTIAL/PLATELET
Basophils Absolute: 0.1 10*3/uL (ref 0.0–0.2)
Basos: 1 %
EOS (ABSOLUTE): 0.3 10*3/uL (ref 0.0–0.4)
Eos: 3 %
Hematocrit: 49.1 % (ref 37.5–51.0)
Hemoglobin: 16.6 g/dL (ref 13.0–17.7)
Immature Grans (Abs): 0 10*3/uL (ref 0.0–0.1)
Immature Granulocytes: 0 %
Lymphocytes Absolute: 3.3 10*3/uL — ABNORMAL HIGH (ref 0.7–3.1)
Lymphs: 33 %
MCH: 31.8 pg (ref 26.6–33.0)
MCHC: 33.8 g/dL (ref 31.5–35.7)
MCV: 94 fL (ref 79–97)
Monocytes Absolute: 0.7 10*3/uL (ref 0.1–0.9)
Monocytes: 7 %
Neutrophils Absolute: 5.6 10*3/uL (ref 1.4–7.0)
Neutrophils: 56 %
Platelets: 189 10*3/uL (ref 150–450)
RBC: 5.22 x10E6/uL (ref 4.14–5.80)
RDW: 13.1 % (ref 11.6–15.4)
WBC: 10.1 10*3/uL (ref 3.4–10.8)

## 2023-11-12 LAB — HEMOGLOBIN A1C
Est. average glucose Bld gHb Est-mCnc: 134 mg/dL
Hgb A1c MFr Bld: 6.3 % — ABNORMAL HIGH (ref 4.8–5.6)

## 2023-11-12 LAB — LIPID PANEL
Chol/HDL Ratio: 3.4 ratio (ref 0.0–5.0)
Cholesterol, Total: 169 mg/dL (ref 100–199)
HDL: 50 mg/dL (ref >39)
LDL Chol Calc (NIH): 86 mg/dL (ref 0–99)
Triglycerides: 193 mg/dL — ABNORMAL HIGH (ref 0–149)
VLDL Cholesterol Cal: 33 mg/dL (ref 5–40)

## 2023-11-12 LAB — VITAMIN B12: Vitamin B-12: 470 pg/mL (ref 232–1245)

## 2023-11-12 LAB — VITAMIN D 25 HYDROXY (VIT D DEFICIENCY, FRACTURES): Vit D, 25-Hydroxy: 55.1 ng/mL (ref 30.0–100.0)

## 2023-11-14 LAB — MICROALBUMIN / CREATININE URINE RATIO
Creatinine, Urine: 149.4 mg/dL
Microalb/Creat Ratio: 26 mg/g{creat} (ref 0–29)
Microalbumin, Urine: 38.1 ug/mL

## 2023-11-15 ENCOUNTER — Other Ambulatory Visit: Payer: Self-pay | Admitting: Cardiology

## 2023-12-01 ENCOUNTER — Telehealth: Payer: Self-pay | Admitting: Internal Medicine

## 2023-12-01 DIAGNOSIS — K0889 Other specified disorders of teeth and supporting structures: Secondary | ICD-10-CM

## 2023-12-01 DIAGNOSIS — Z98811 Dental restoration status: Secondary | ICD-10-CM

## 2023-12-01 NOTE — Telephone Encounter (Signed)
 Received message below regarding patients dentures being lost due to his dog. Patient unable to eat properly and he is unable to replaced them. CRR referral placed to see what resources are available to assist patient with obtaining new dentures.   Sally Crazier, CMA  [facility] AWV Team Direct Dial: [phone]   Copied from CRM [id number]. Topic: Clinical - Medical Advice >> Dec 01, 2023  3:33 PM Lizabeth Riggs wrote: Reason for CRM:   Spoke with the patient to schedule his AWV.    He asked me about getting help with replacing his dentures that his dog recently ate.   He explained that his dentist told him that Medicaid will not replace for eight years.  He explained that he is not able to eat properly due to not having his teeth.    Amy said to ask you to send a [id number] referral for him, please.  Thank you,  Trevor Fudge,  AMB Clinical Support  Jairon called back to see if letter can be completed as soon as possible so he can eat better. His dentist is Dr. Donna Fus at [facility]; [address], [place], [place] [address] Phone: [phone] or [phone] Please call Ezana when letter is ready to be picked up.  Thanks

## 2023-12-01 NOTE — Telephone Encounter (Signed)
 Spoke with the patient to schedule his AWV.    He asked me about getting help with replacing his dentures that his dog recently ate.   He explained that his dentist told him that Medicaid will not replace for eight years.  He explained that he is not able to eat properly due to not having his teeth.    Amy said to ask you to send a [id number] referral for him, please.  Thank you,  Trevor Fudge,  AMB Clinical Support [facility] AWV Program Direct Dial ??[phone]

## 2023-12-03 ENCOUNTER — Telehealth: Payer: Self-pay

## 2023-12-03 NOTE — Progress Notes (Signed)
   Telephone encounter was:  Unsuccessful.  12/03/2023 Name: Duane Adams MRN: [id number] DOB: 12/06/1955  Unsuccessful outbound call made today to assist with:  Dental  Outreach Attempt:  1st Attempt  A HIPAA compliant voice message was left requesting a return call.  Instructed patient to call back   Azell Leopard [facility] Guide, Phone: [phone] Fax: [phone] Website: [email or web address]

## 2023-12-04 ENCOUNTER — Telehealth: Payer: Self-pay

## 2023-12-04 NOTE — Progress Notes (Signed)
   Telephone encounter was:  Unsuccessful.  12/04/2023 Name: Duane Adams MRN: [id number] DOB: 20-Nov-1955  Unsuccessful outbound call made today to assist with:  Dental  Outreach Attempt:  2nd Attempt  A HIPAA compliant voice message was left requesting a return call.  Instructed patient to call back    Azell Leopard [facility] Guide, Phone: [phone] Fax: [phone] Website: [email or web address]

## 2023-12-08 ENCOUNTER — Telehealth: Payer: Self-pay

## 2023-12-08 NOTE — Progress Notes (Signed)
   Telephone encounter was:  Unsuccessful.  12/08/2023 Name: Duane Adams MRN: [id number] DOB: 1956-01-19  Unsuccessful outbound call made today to assist with:  Dental   Outreach Attempt:  3rd Attempt.  Referral closed unable to contact patient.  A HIPAA compliant voice message was left requesting a return call.  Instructed patient to call back    Azell Leopard [facility] [address] Guide, Phone: [phone] Fax: [phone] Website: [email or web address]

## 2024-01-01 ENCOUNTER — Other Ambulatory Visit: Payer: Self-pay

## 2024-01-01 MED ORDER — AMLODIPINE BESYLATE 10 MG PO TABS: 10.0000 mg | ORAL_TABLET | Freq: Every day | ORAL | 0 refills | Status: DC

## 2024-01-05 ENCOUNTER — Other Ambulatory Visit: Payer: Self-pay | Admitting: Internal Medicine

## 2024-01-05 DIAGNOSIS — J410 Simple chronic bronchitis: Secondary | ICD-10-CM

## 2024-01-11 ENCOUNTER — Other Ambulatory Visit: Payer: Self-pay | Admitting: Cardiology

## 2024-01-18 ENCOUNTER — Encounter: Payer: Self-pay | Admitting: Cardiology

## 2024-01-18 ENCOUNTER — Ambulatory Visit: Attending: Cardiology | Admitting: Cardiology

## 2024-01-18 VITALS — BP 124/62 | HR 82 | Ht 69.0 in | Wt 184.6 lb

## 2024-01-18 DIAGNOSIS — I25118 Atherosclerotic heart disease of native coronary artery with other forms of angina pectoris: Secondary | ICD-10-CM | POA: Insufficient documentation

## 2024-01-18 DIAGNOSIS — I10 Essential (primary) hypertension: Secondary | ICD-10-CM | POA: Insufficient documentation

## 2024-01-18 DIAGNOSIS — E782 Mixed hyperlipidemia: Secondary | ICD-10-CM | POA: Insufficient documentation

## 2024-01-18 NOTE — Patient Instructions (Signed)
 Medication Instructions:  Continue all current medications.   Labwork: none  Testing/Procedures: none  Follow-Up: 6 months   Any Other Special Instructions Will Be Listed Below (If Applicable).   If you need a refill on your cardiac medications before your next appointment, please call your pharmacy.

## 2024-01-18 NOTE — Progress Notes (Signed)
 Clinical Summary [name] is a 68 y.o.male seen today for follow up medical problems.   1. CAD   -  cath 04/11/13 showed significant LAD and RCA disease, s/p DES to both  - echo 03/2013 with normal LVEF   - seen at [facility] for chest pain 08/2016. LHC at that time showed only mild CAD along with patent stents.   - previously lowered imdur  to 15mg  daily due to headaches, increased Toprol  to 75mg  daily. Has had some increased fatigue, poor sleep.         01/2021 reported progressive chest pain and DOE - 01/2021 cath: LM normal, LAD patent stent and mid 25%, D1 35%, LCX 40%, ostial RCA 10% and prox 30% -55-65%     - no chest pains, some chronic SOB/DOE. Sedentary due to chronic back pains.  - compliant with meds       2. Hyperlipidemia   - bad reaction to crestor, currently on atorvastatin  08/2021 TC 163 TG 262 HDL 45 LDL 75 09/2022 TC 188 TG 438 HDL 49 LDL 71 02/2023 TC 147 TG 411 HDL 45 LDL 42 02/2023 pcp started vascepa   10/2023 TC 169 TG 193 HDL 50 LDL 86 - discussed adding zetia, not in favor at this time.    3. HTN   - admit July 2020 with severe HTN, chest pain, stroke like symptoms - home bp's usually 120-160s/80s.  - ACEI alllergy with neck swelling, has not been on ARB due to cross reactivity risk.     - compliant with meds   4. History of GI bleed - followed by GI, from notes thought to be due to internal hemorroids.    5. PAD - followed by vascular - aniography 06/2017 without significant disease.  - left subclavian stenosis. 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   Concussion several yrs ago   no residual   Coronary artery disease    a. 03/2013 Cath/PCI: LM nl, LAD 74m (2.5x20 Promus Premier DES), LCX min irregs, RCA dom, 90p(3.0x20 Promus Premier DES), EF 55-65%. b. patent stents by caths in 08/2016 and 01/2021   CVA  (cerebral vascular accident) (HCC) 01/31/2019   slight memory issues   Essential hypertension, benign    GERD (gastroesophageal reflux disease)    Helicobacter pylori gastritis JUN 2016 EGD/Bx   PREVPAK BID FOR 14 DAYS   Hepatitis    Hepatitis C treated  2011   Hiatal hernia    History of kidney stones    Mixed hyperlipidemia    Neuroma    Prediabetes 06/23/2019   Skin cancer    Substance abuse (HCC)    alcoholic quit 2016   Tobacco abuse      Allergies  Allergen Reactions   Lisinopril Swelling    Swelling neck   Crestor [Rosuvastatin] Other (See Comments)    Severe muscle aches   Chantix  [Varenicline ] Other (See Comments)    Abdominal pain   Neosporin [Neomycin-Bacitracin Zn-Polymyx] Rash    blistered     Current Outpatient Medications  Medication Sig Dispense Refill   amLODipine  (NORVASC ) 10 MG tablet Take 1 tablet (10 mg total) by mouth daily. 90 tablet 0   aspirin  EC 81 MG tablet Take 81 mg by mouth at bedtime.  atorvastatin  (LIPITOR) 80 MG tablet TAKE ONE TABLET BY MOUTH ONCE DAILY. 90 tablet 1   BREZTRI  AEROSPHERE 160-9-4.8 MCG/ACT AERO INHALE 2 PUFFS INTO THE LUNGS 2 TIMES DAILY. 10.7 g 11   chlorthalidone  (HYGROTON ) 25 MG tablet TAKE 1 TABLET BY MOUTH ONCE DAILY. 90 tablet 3   Cholecalciferol (VITAMIN D3) 2000 units TABS Take 4,000 Units by mouth 2 (two) times daily.     cloNIDine  (CATAPRES ) 0.1 MG tablet TAKE (1) TABLET BY MOUTH TWICE DAILY. 180 tablet 3   cyclobenzaprine  (FLEXERIL ) 10 MG tablet Take 1 tablet (10 mg total) by mouth at bedtime. 30 tablet 3   empagliflozin  (JARDIANCE ) 10 MG TABS tablet Take 1 tablet (10 mg total) by mouth daily. 90 tablet 3   isosorbide  mononitrate (IMDUR ) 30 MG 24 hr tablet TAKE 1 TABLET BY MOUTH ONCE DAILY. 90 tablet 2   metoprolol  succinate (TOPROL -XL) 100 MG 24 hr tablet TAKE ONE TABLET BY MOUTH ONCE DAILY WITH 50MG . TOTAL OF 150MG  DAILY. 90 tablet 2   metoprolol  succinate (TOPROL -XL) 50 MG 24 hr tablet Take 1 tablet (50  mg total) by mouth daily. Take with or immediately following a meal. 90 tablet 2   Multiple Vitamin (MULTIVITAMIN WITH MINERALS) TABS tablet Take 1 tablet by mouth daily.     nitroGLYCERIN  (NITROSTAT ) 0.4 MG SL tablet Place 1 tablet (0.4 mg total) under the tongue every 5 (five) minutes as needed for chest pain. 25 tablet 3   Omega 3 1200 MG CAPS Take 2,400 mg by mouth daily.     potassium chloride  SA (KLOR-CON  M) 20 MEQ tablet TAKE ONE TABLET BY MOUTH ONCE DAILY. 90 tablet 3   VASCEPA  1 g capsule TAKE 2 CAPSULES BY MOUTH 2 TIMES DAILY. 120 capsule 5   VENTOLIN  HFA 108 (90 Base) MCG/ACT inhaler INHALE TWO PUFFS INTO THE LUNGS EVERY 6 HOURS AS NEEDED FOR WHEEZING OR SHORTNESS OF BREATH 18 g 0   No current facility-administered medications for this visit.     Past Surgical History:  Procedure Laterality Date   ABDOMINAL AORTOGRAM W/LOWER EXTREMITY N/A 06/15/2017   Procedure: ABDOMINAL AORTOGRAM W/LOWER EXTREMITY;  Surgeon: Eliza Lonni RAMAN, MD;  Location: [facility] INVASIVE CV LAB;  Service: Cardiovascular;  Laterality: N/A;  Bilateral   BIOPSY N/A 12/19/2014   Procedure: BIOPSY;  Surgeon: Margo LITTIE Haddock, MD;  Location: [facility];  Service: Endoscopy;  Laterality: N/A;   COLONOSCOPY  12/2014   [facility]: with endoscopic mucosal resection. 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 Needs colonoscopy in 1 year.    COLONOSCOPY WITH PROPOFOL  N/A 12/19/2014   Dr. Haddock: six simple adenomas and 3 hyperplastic polyps. Had flat mid-transverse colon polyp and referred to [facility] for endoscopic mucosal resection, which is scheduled for July 22   COLONOSCOPY WITH PROPOFOL  N/A 04/01/2021   Surgeon: Cindie Carlin POUR, DO;Nonbleeding internal hemorrhoids, diverticulosis in the sigmoid colon, tattoo in transverse colon with tattoo site appearing normal.  Recommended repeat colonoscopy in 5 years.   CORONARY ANGIOPLASTY WITH STENT PLACEMENT  04/11/2013   LAD &  RCA     DR COOPER    ESOPHAGOGASTRODUODENOSCOPY (EGD) WITH PROPOFOL  N/A 12/19/2014   Dr. Haddock:  without varices, small hiatal hernia noted, moderate non-erosive gastritis and mild duodenitis.  POSITIVE H.PYLORI. Prescribed Prevpac.   EXCISION MORTON'S NEUROMA Left 08/15/2020   Procedure: EXCISION MORTON'S NEUROMA THIRD INTERSPACE LEFT FOOT;  Surgeon: Tobie Franky SQUIBB, DPM;  Location: [facility];  Service: Podiatry;  Laterality: Left;  EXTRACORPOREAL SHOCK WAVE LITHOTRIPSY  2001   FLEXIBLE SIGMOIDOSCOPY N/A 12/23/2017   Procedure: FLEXIBLE SIGMOIDOSCOPY;  Surgeon: Harvey Margo CROME, MD;  Location: [facility];  Service: Endoscopy;  Laterality: N/A;  1:45pm   HAND RECONSTRUCTION Left    HEMORRHOID BANDING N/A 12/23/2017   Procedure: HEMORRHOID BANDING;  Surgeon: Harvey Margo CROME, MD;  Location: [facility];  Service: Endoscopy;  Laterality: N/A;   LEFT HEART CATH AND CORONARY ANGIOGRAPHY N/A 01/22/2021   Procedure: LEFT HEART CATH AND CORONARY ANGIOGRAPHY;  Surgeon: [place], Peter M, MD;  Location: [facility] INVASIVE CV LAB;  Service: Cardiovascular;  Laterality: N/A;   LEFT HEART CATHETERIZATION WITH CORONARY ANGIOGRAM N/A 04/11/2013   Procedure: LEFT HEART CATHETERIZATION WITH CORONARY ANGIOGRAM;  Surgeon: Ozell JONETTA Fell, MD;  Location: [facility] CATH LAB;  Service: Cardiovascular;  Laterality: N/A;   LEG TENDON SURGERY Right    LIPOMA EXCISION Left 09/13/2021   Procedure: EXCISION LIPOMA; SHOULDER, 4CM;  Surgeon: Evonnie Dorothyann LABOR, DO;  Location: [facility];  Service: General;  Laterality: Left;   MASS EXCISION N/A 09/13/2021   Procedure: EXCISION MASS; BACK, 3CM;  Surgeon: Evonnie Dorothyann LABOR, DO;  Location: [facility];  Service: General;  Laterality: N/A;   POLYPECTOMY N/A 12/19/2014   Procedure: POLYPECTOMY;  Surgeon: Margo CROME Harvey, MD;  Location: [facility];  Service: Endoscopy;  Laterality: N/A;     Allergies  Allergen Reactions   Lisinopril Swelling    Swelling neck   Crestor [Rosuvastatin] Other (See  Comments)    Severe muscle aches   Chantix  [Varenicline ] Other (See Comments)    Abdominal pain   Neosporin [Neomycin-Bacitracin Zn-Polymyx] Rash    blistered      Family History  Problem Relation Age of Onset   Stroke Mother    Aneurysm Mother    Hypertension Mother    Heart disease Father    Hypertension Father    Heart disease Brother    Hypertension Brother    Alcohol abuse Brother    Diabetes Paternal Aunt    Colon cancer Neg Hx      Social History Mr. Mermelstein reports that he has been smoking cigarettes. He started smoking about 48 years ago. He has a 48.9 pack-year smoking history. He has never used smokeless tobacco. Mr. Luu reports current alcohol use.    Physical Examination Today's Vitals   01/18/24 1257  BP: 124/62  Pulse: 82  SpO2: 95%  Weight: 184 lb 9.6 oz (83.7 kg)  Height: 5' 9 (1.753 m)   Body mass index is 27.26 kg/m.  Gen: resting comfortably, no acute distress HEENT: no scleral icterus, pupils equal round and reactive, no palptable cervical adenopathy,  CV: RRR, no m/rg, no jvd Resp: Clear to auscultation bilaterally GI: abdomen is soft, non-tender, non-distended, normal bowel sounds, no hepatosplenomegaly MSK: extremities are warm, no edema.  Skin: warm, no rash Neuro:  no focal deficits Psych: appropriate affect   Diagnostic Studies  04/11/13 Cath   PROCEDURAL FINDINGS   Hemodynamics:   AO 178/93   LV 177/18   Coronary angiography:   Coronary dominance: right   Left mainstem: Widely patent without obstructive disease   Left anterior descending (LAD): severe mid-vessel stenosis (80%) between the first and second septal perforators. The vessel reaches the LV apex and there is no other significant disease noted. Large D1 is patent.   Left circumflex (LCx): small vessel, supplies 2 OM branches. Mild irregularity noted.   Right coronary artery (RCA): large, dominant vessel. There is  severe 90% proximal stenosis. Irregularity in  the mid-vessel without significant stenosis. The PDA and PLA branches are both large without significant disease.   Left ventriculography: Left ventricular systolic function is normal, LVEF is estimated at 55-65%, there is no significant mitral regurgitation   PCI Note: Following the diagnostic procedure, the decision was made to proceed with PCI. The patient was loaded with plavix  600 mg. Weight-based bivalirudin  was given for anticoagulation. I planned on treating the LAD and RCA, both of which have high-grade disease. Once a therapeutic ACT was achieved, a 6 [place] XB-LAD guide catheter was inserted. A cougar coronary guidewire was used to cross the lesion. The lesion was predilated with a 2.0 mm balloon. The lesion was then stented with a 2.5x20 mm Promus Premier drug-eluting stent. The stent was postdilated with a 2.75 mm noncompliant balloon. Following PCI, there was 0% residual stenosis and TIMI-3 flow. Attention was then turned to the RCA. A JR-4 guide was used. The same cougar guidewire was used to cross the lesion. The lesion was dilated with a 2.0 mm balloon and stented with a 3.0x20 mm Promus Premier DES. The stent was post-dilated to 18 atm with a 3.25 mm Sam Rayburn balloon. Final angiography confirmed an excellent result. The patient tolerated the procedure well. There were no immediate procedural complications. A TR band was used for radial hemostasis. The patient was transferred to the post catheterization recovery area for further monitoring.   PCI Data:   Lesion 1   Vessel - LAD/Segment - mid   Percent Stenosis (pre) 80   TIMI-flow 3   Stent 2.5x20 mm Promus Premier DES   Percent Stenosis (post) 0   TIMI-flow (post) 3   Lesion 2   Vessel - RCA/Segment - prox   Percent Stenosis (pre) 90   TIMI-flow 3   Stent 3.0x20 mm Promus Premier DES   Percent Stenosis (post) 0   TIMI-flow (post) 3   Final Conclusions:   Severe 2 vessel CAD with successful PCI of the LAD and RCA   Normal LV function    Recommendations:   ASA and plavix  for at least 12 months.     04/04/13 Echo   LVEF 60-65%, no WMA, grade I diastolic dysfunction       10/2014 Exercise Nuclear Stress IMPRESSION: 1. No reversible ischemia or infarction. Submaximal stress test, thus diminishing the ability of this test to detect ischemia.   2. Normal left ventricular wall motion.   3. Left ventricular ejection fraction 78%   4. Low-risk stress test findings*.  Consider Lexiscan  in the future.     08/2016 [facility] cath Angiography: LVgram: not performed LCA: LAD stent patent, mild luminal irregularites, no significant obstructive disease RCA: proximal stent patent, large R dominant RCA,mild luminal irregularities in distal Catheline Hixon  Impression:   Mild CAD with Patent Stents RCA and LAD Normal resting LVedp (14 mm)  Plan:  Follow-up with referring physician. Echocardiogram suggested to evaluate possible HFrEF as cause for symptoms   Assessment and Plan   1. CAD with stable angina - most recent cath showed no significant disease - denies recent symptoms, continue current meds   2. Hyperlipidemia - LDL above goal, he favors monitoring for now, not in favor of adding zetia to his atorvastatin . Previously did not tolerate rosuvastatin.    3.HTN - he is at goal, continue current meds    Dorn PHEBE Ross, M.D.

## 2024-02-12 ENCOUNTER — Ambulatory Visit (HOSPITAL_COMMUNITY)
Admission: RE | Admit: 2024-02-12 | Discharge: 2024-02-12 | Disposition: A | Discharge status: Home / Self Care | Source: Ambulatory Visit | Attending: Acute Care | Admitting: Acute Care

## 2024-02-12 DIAGNOSIS — Z87891 Personal history of nicotine dependence: Secondary | ICD-10-CM | POA: Diagnosis present

## 2024-02-12 DIAGNOSIS — Z122 Encounter for screening for malignant neoplasm of respiratory organs: Secondary | ICD-10-CM | POA: Diagnosis present

## 2024-02-12 DIAGNOSIS — F17210 Nicotine dependence, cigarettes, uncomplicated: Secondary | ICD-10-CM | POA: Diagnosis present

## 2024-02-29 ENCOUNTER — Telehealth: Payer: Self-pay | Admitting: Acute Care

## 2024-02-29 NOTE — Telephone Encounter (Signed)
 LR 2>> 12 month annual scan due around 02/11/2025.  12 month follow up is fine on this scan. Both the solid component and the overall nodule size has decreased.  Please fax results to PCP and let them know plan of care. Thanks so much

## 2024-03-01 ENCOUNTER — Other Ambulatory Visit: Payer: Self-pay | Admitting: Acute Care

## 2024-03-01 DIAGNOSIS — Z87891 Personal history of nicotine dependence: Secondary | ICD-10-CM

## 2024-03-01 DIAGNOSIS — F17210 Nicotine dependence, cigarettes, uncomplicated: Secondary | ICD-10-CM

## 2024-03-01 DIAGNOSIS — Z122 Encounter for screening for malignant neoplasm of respiratory organs: Secondary | ICD-10-CM

## 2024-03-01 NOTE — Telephone Encounter (Signed)
 Mychart result letter sent to patient. Results faxed to PCP. Order placed for 12 month yearly lung screening CT.

## 2024-03-02 ENCOUNTER — Ambulatory Visit (INDEPENDENT_AMBULATORY_CARE_PROVIDER_SITE_OTHER)

## 2024-03-02 VITALS — Ht 69.0 in | Wt 190.0 lb

## 2024-03-02 DIAGNOSIS — Z0000 Encounter for general adult medical examination without abnormal findings: Secondary | ICD-10-CM

## 2024-03-02 NOTE — Progress Notes (Signed)
 Subjective:   Duane Adams is a 68 y.o. who presents for a Medicare Wellness preventive visit.  As a reminder, Annual Wellness Visits don't include a physical exam, and some assessments may be limited, especially if this visit is performed virtually. We may recommend an in-person follow-up visit with your provider if needed.  Visit Complete: Virtual I connected with  Duane Adams on 03/02/24 by a audio enabled telemedicine application and verified that I am speaking with the correct person using two identifiers.  Patient Location: Home  Provider Location: Office/Clinic  I discussed the limitations of evaluation and management by telemedicine. The patient expressed understanding and agreed to proceed.  Vital Signs: Because this visit was a virtual/telehealth visit, some criteria may be missing or patient reported. Any vitals not documented were not able to be obtained and vitals that have been documented are patient reported.  VideoDeclined- This patient declined Librarian, academic. Therefore the visit was completed with audio only.  Persons Participating in Visit: Patient.  AWV Questionnaire: No: Patient Medicare AWV questionnaire was not completed prior to this visit.  Cardiac Risk Factors include: advanced age (>65men, >39 women);dyslipidemia;sedentary lifestyle;smoking/ tobacco exposure;hypertension;male gender     Objective:    Today's Vitals   03/02/24 1535  Weight: 190 lb (86.2 kg)  Height: 5' 9 (1.753 m)   Body mass index is 28.06 kg/m.     03/02/2024    3:38 PM 04/01/2021    9:02 AM 03/28/2021   10:34 AM 03/18/2021    7:18 PM 03/18/2021    2:56 PM 01/22/2021    7:17 AM 08/15/2020   10:19 AM  Advanced Directives  Does Patient Have a Medical Advance Directive? No No No No No No No  Would patient like information on creating a medical advance directive? No - Patient declined No - Patient declined No - Patient declined Yes (Inpatient -  patient defers creating a medical advance directive and declines information at this time) No - Patient declined No - Patient declined No - Patient declined    Current Medications (verified) Outpatient Encounter Medications as of 03/02/2024  Medication Sig   amLODipine  (NORVASC ) 10 MG tablet Take 1 tablet (10 mg total) by mouth daily.   aspirin  EC 81 MG tablet Take 81 mg by mouth at bedtime.    atorvastatin  (LIPITOR) 80 MG tablet TAKE ONE TABLET BY MOUTH ONCE DAILY.   BREZTRI  AEROSPHERE 160-9-4.8 MCG/ACT AERO INHALE 2 PUFFS INTO THE LUNGS 2 TIMES DAILY.   chlorthalidone  (HYGROTON ) 25 MG tablet TAKE 1 TABLET BY MOUTH ONCE DAILY.   Cholecalciferol (VITAMIN D3) 2000 units TABS Take 4,000 Units by mouth 2 (two) times daily.   cloNIDine  (CATAPRES ) 0.1 MG tablet TAKE (1) TABLET BY MOUTH TWICE DAILY.   cyclobenzaprine  (FLEXERIL ) 10 MG tablet Take 1 tablet (10 mg total) by mouth at bedtime.   empagliflozin  (JARDIANCE ) 10 MG TABS tablet Take 1 tablet (10 mg total) by mouth daily.   isosorbide  mononitrate (IMDUR ) 30 MG 24 hr tablet TAKE 1 TABLET BY MOUTH ONCE DAILY.   metoprolol  succinate (TOPROL -XL) 100 MG 24 hr tablet TAKE ONE TABLET BY MOUTH ONCE DAILY WITH 50MG . TOTAL OF 150MG  DAILY.   metoprolol  succinate (TOPROL -XL) 50 MG 24 hr tablet Take 1 tablet (50 mg total) by mouth daily. Take with or immediately following a meal.   Multiple Vitamin (MULTIVITAMIN WITH MINERALS) TABS tablet Take 1 tablet by mouth daily.   nitroGLYCERIN  (NITROSTAT ) 0.4 MG SL tablet Place 1 tablet (  0.4 mg total) under the tongue every 5 (five) minutes as needed for chest pain.   Omega 3 1200 MG CAPS Take 2,400 mg by mouth daily.   potassium chloride  SA (KLOR-CON  M) 20 MEQ tablet TAKE ONE TABLET BY MOUTH ONCE DAILY.   VASCEPA  1 g capsule TAKE 2 CAPSULES BY MOUTH 2 TIMES DAILY.   VENTOLIN  HFA 108 (90 Base) MCG/ACT inhaler INHALE TWO PUFFS INTO THE LUNGS EVERY 6 HOURS AS NEEDED FOR WHEEZING OR SHORTNESS OF BREATH   No  facility-administered encounter medications on file as of 03/02/2024.    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   Concussion several yrs ago   no residual   Coronary artery disease    a. 03/2013 Cath/PCI: LM nl, LAD 8m (2.5x20 Promus Premier DES), LCX min irregs, RCA dom, 90p(3.0x20 Promus Premier DES), EF 55-65%. b. patent stents by caths in 08/2016 and 01/2021   CVA (cerebral vascular accident) (HCC) 01/31/2019   slight memory issues   Essential hypertension, benign    GERD (gastroesophageal reflux disease)    Helicobacter pylori gastritis JUN 2016 EGD/Bx   PREVPAK BID FOR 14 DAYS   Hepatitis    Hepatitis C treated  2011   Hiatal hernia    History of kidney stones    Mixed hyperlipidemia    Neuroma    Prediabetes 06/23/2019   Skin cancer    Substance abuse (HCC)    alcoholic quit 2016   Tobacco abuse    Past Surgical History:  Procedure Laterality Date   ABDOMINAL AORTOGRAM W/LOWER EXTREMITY N/A 06/15/2017   Procedure: ABDOMINAL AORTOGRAM W/LOWER EXTREMITY;  Surgeon: Eliza Lonni RAMAN, MD;  Location: [facility] INVASIVE CV LAB;  Service: Cardiovascular;  Laterality: N/A;  Bilateral   BIOPSY N/A 12/19/2014   Procedure: BIOPSY;  Surgeon: Margo LITTIE Haddock, MD;  Location: [facility];  Service: Endoscopy;  Laterality: N/A;   COLONOSCOPY  12/2014   [facility]: with endoscopic mucosal resection. 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 Needs colonoscopy in 1 year.    COLONOSCOPY WITH PROPOFOL  N/A 12/19/2014   Dr. Haddock: six simple adenomas and 3 hyperplastic polyps. Had flat mid-transverse colon polyp and referred to [facility] for endoscopic mucosal resection, which is scheduled for July 22   COLONOSCOPY WITH PROPOFOL  N/A 04/01/2021   Surgeon:  Cindie Carlin POUR, DO;Nonbleeding internal hemorrhoids, diverticulosis in the sigmoid colon, tattoo in transverse colon with tattoo site appearing normal.  Recommended repeat colonoscopy in 5 years.   CORONARY ANGIOPLASTY WITH STENT PLACEMENT  04/11/2013   LAD &  RCA     DR COOPER   ESOPHAGOGASTRODUODENOSCOPY (EGD) WITH PROPOFOL  N/A 12/19/2014   Dr. Haddock:  without varices, small hiatal hernia noted, moderate non-erosive gastritis and mild duodenitis.  POSITIVE H.PYLORI. Prescribed Prevpac.   EXCISION MORTON'S NEUROMA Left 08/15/2020   Procedure: EXCISION MORTON'S NEUROMA THIRD INTERSPACE LEFT FOOT;  Surgeon: Tobie Franky SQUIBB, DPM;  Location: [facility];  Service: Podiatry;  Laterality: Left;   EXTRACORPOREAL SHOCK WAVE LITHOTRIPSY  2001   FLEXIBLE SIGMOIDOSCOPY N/A 12/23/2017   Procedure: FLEXIBLE SIGMOIDOSCOPY;  Surgeon: Haddock Margo LITTIE, MD;  [facility];  Service: Endoscopy;  Laterality: N/A;  1:45pm   HAND RECONSTRUCTION Left    HEMORRHOID BANDING N/A 12/23/2017   Procedure:  HEMORRHOID BANDING;  Surgeon: Harvey Margo CROME, MD;  Location: [facility];  Service: Endoscopy;  Laterality: N/A;   LEFT HEART CATH AND CORONARY ANGIOGRAPHY N/A 01/22/2021   Procedure: LEFT HEART CATH AND CORONARY ANGIOGRAPHY;  Surgeon: Swazil[place], Peter M, MD;  Location: [facility] INVASIVE CV LAB;  Service: Cardiovascular;  Laterality: N/A;   LEFT HEART CATHETERIZATION WITH CORONARY ANGIOGRAM N/A 04/11/2013   Procedure: LEFT HEART CATHETERIZATION WITH CORONARY ANGIOGRAM;  Surgeon: Ozell JONETTA Fell, MD;  Location: [facility] CATH LAB;  Service: Cardiovascular;  Laterality: N/A;   LEG TENDON SURGERY Right    LIPOMA EXCISION Left 09/13/2021   Procedure: EXCISION LIPOMA; SHOULDER, 4CM;  Surgeon: Evonnie Dorothyann LABOR, DO;  Location: [facility];  Service: General;  Laterality: Left;   MASS EXCISION N/A 09/13/2021   Procedure: EXCISION MASS; BACK, 3CM;  Surgeon: Evonnie Dorothyann LABOR, DO;  Location: [facility];  Service:  General;  Laterality: N/A;   POLYPECTOMY N/A 12/19/2014   Procedure: POLYPECTOMY;  Surgeon: Margo CROME Harvey, MD;  Location: [facility];  Service: Endoscopy;  Laterality: N/A;   Family History  Problem Relation Age of Onset   Stroke Mother    Aneurysm Mother    Hypertension Mother    Heart disease Father    Hypertension Father    Heart disease Brother    Hypertension Brother    Alcohol abuse Brother    Diabetes Paternal Aunt    Colon cancer Neg Hx    Social History   Socioeconomic History   Marital status: Significant Other    Spouse name: Delwin   Number of children: 2   Years of education: 14   Highest education level: Not on file  Occupational History   Occupation: disabled    Comment: heart  Tobacco Use   Smoking status: Every Day    Current packs/day: 1.00    Average packs/day: 1 pack/day for 49.1 years (49.1 ttl pk-yrs)    Types: Cigarettes    Start date: 02/06/1975   Smokeless tobacco: Never   Tobacco comments:    5-6 Cigarettes per day  Vaping Use   Vaping status: Never Used  Substance and Sexual Activity   Alcohol use: Yes    Comment: 2-3 beer a couple times a year (10/21/21) Previously 5-6 beer every 2 weeks.   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   Leisure: Animator   Social Drivers of Corporate investment banker Strain: Low Risk  (03/02/2024)   Overall Financial Resource Strain (CARDIA)    Difficulty of Paying Living Expenses: Not hard at all  Food Insecurity: No Food Insecurity (03/02/2024)   Hunger Vital Sign    Worried About Running Out of Food in the Last Year: Never true    Ran Out of Food in the Last Year: Never true  Transportation Needs: No Transportation Needs (03/02/2024)   PRAPARE -  Administrator, Civil Service (Medical): No    Lack of Transportation (Non-Medical): No  Physical Activity: Patient Declined (03/02/2024)   Exercise Vital Sign    Days of Exercise per Week: Patient declined    Minutes of Exercise per Session: Patient declined  Stress: No Stress Concern Present (03/02/2024)  [employer] of Occupational Health - Occupational Stress Questionnaire    Feeling of Stress: Not at all  Social Connections: Moderately Isolated (03/02/2024)   Social Connection and Isolation Panel    Frequency of Communication with Friends and Family: More than three times a week    Frequency of Social Gatherings with Friends and Family: More than three times a week    Attends Religious Services: Never    Database administrator or Organizations: No    Attends Engineer, structural: Never    Marital Status: Married    Tobacco Counseling Ready to quit: Yes Counseling given: Yes Tobacco comments: 5-6 Cigarettes per day    Clinical Intake:  Pre-visit preparation completed: Yes  Pain : No/denies pain     BMI - recorded: 28.06 Nutritional Status: BMI 25 -29 Overweight Nutritional Risks: None Diabetes: No  Lab Results  Component Value Date   HGBA1C 6.3 (H) 11/11/2023   HGBA1C 6.1 (H) 02/23/2023   HGBA1C 6.2 (H) 10/10/2022     How often do you need to have someone help you when you read instructions, pamphlets, or other written materials from your doctor or pharmacy?: 1 - Never  Interpreter Needed?: No  Information entered by :: Loco Jon W CMA (AAMA)   Activities of Daily Living     03/02/2024    3:38 PM  In your present state of health, do you have any difficulty performing the following activities:  Hearing? 0  Vision? 1  Comment resources provided.  Difficulty concentrating or making decisions? 0  Walking or climbing stairs? 1  Comment chronic low back pain  Dressing or bathing? 0  Doing errands, shopping? 0  Preparing Food and  eating ? N  Using the Toilet? N  In the past six months, have you accidently leaked urine? N  Do you have problems with loss of bowel control? N  Managing your Medications? N  Managing your Finances? N  Housekeeping or managing your Housekeeping? N    Patient Care Team: Tobie Suzzane POUR, MD as PCP - General (Internal Medicine) Alvan Dorn FALCON, MD as PCP - Cardiology (Cardiology) Cindie Carlin POUR, DO as Consulting Physician (Gastroenterology)  I have updated your Care Teams any recent Medical Services you may have received from other providers in the past year.     Assessment:   This is a routine wellness examination for [facility].  Hearing/Vision screen Hearing Screening - Comments:: Patient denies any hearing difficulties.   Vision Screening - Comments:: Patient c/o difficulty seeing. Resources provided so he can find an eye care provider.     Goals Addressed               This Visit's Progress     Weight (lb) < 200 lb (90.7 kg) (pt-stated)   190 lb (86.2 kg)     I want to lose at least 20 pounds        Depression Screen     03/02/2024    3:47 PM 11/11/2023   10:11 AM 03/04/2023    9:34 AM 10/16/2022    2:50 PM 08/13/2022    2:11 PM 06/16/2022    1:42 PM 02/11/2022    1:09 PM  PHQ 2/9 Scores  PHQ - 2 Score 0 0 2 0 2 2 0  PHQ- 9 Score 0 0 6  9 9      Fall Risk     03/02/2024    3:43 PM 11/11/2023   10:11 AM 03/04/2023  9:34 AM 10/16/2022    2:50 PM 08/13/2022    2:09 PM  Fall Risk   Falls in the past year? 0 0 1 1 0  Number falls in past yr: 0 0 0 0 0  Injury with Fall? 0 0 0 1 0  Risk for fall due to : No Fall Risks No Fall Risks     Follow up Falls evaluation completed;Education provided;Falls prevention discussed Falls evaluation completed       MEDICARE RISK AT HOME:  Medicare Risk at Home Any stairs in or around the home?: Yes If so, are there any without handrails?: No Home free of loose throw rugs in walkways, pet beds, electrical cords, etc?:  Yes Adequate lighting in your home to reduce risk of falls?: Yes Life alert?: No Use of a cane, walker or w/c?: No Grab bars in the bathroom?: No Shower chair or bench in shower?: No Elevated toilet seat or a handicapped toilet?: No  TIMED UP AND GO:  Was the test performed?  No  Cognitive Function: 6CIT completed    08/13/2022    2:34 PM  MMSE - Mini Mental State Exam  Orientation to time 5  Orientation to Place 5  Registration 3  Attention/ Calculation 5  Recall 2  Language- name 2 objects 2  Language- repeat 1  Language- follow 3 step command 3  Language- read & follow direction 1  Write a sentence 1  Copy design 1  Total score 29        03/02/2024    3:46 PM  6CIT Screen  What Year? 0 points  What month? 0 points  What time? 0 points  Count back from 20 0 points  Months in reverse 0 points  Repeat phrase 0 points  Total Score 0 points    Immunizations Immunization History  Administered Date(s) Administered   Fluad Quad(high Dose 65+) 06/23/2019   Influenza,inj,Quad PF,6+ Mos 05/05/2017   Pneumococcal Conjugate-13 05/05/2017   Tdap 06/08/2017    Screening Tests Health Maintenance  Topic Date Due   COVID-19 Vaccine (1) Never done   OPHTHALMOLOGY EXAM  Never done   Zoster Vaccines- Shingrix (1 of 2) Never done   Pneumococcal Vaccine: 50+ Years (2 of 2 - PPSV23, PCV20, or PCV21) 06/30/2017   INFLUENZA VACCINE  02/12/2024   FOOT EXAM  03/03/2024   HEMOGLOBIN A1C  05/12/2024   Diabetic kidney evaluation - eGFR measurement  11/10/2024   Diabetic kidney evaluation - Urine ACR  11/10/2024   Lung Cancer Screening  02/11/2025   Medicare Annual Wellness (AWV)  03/02/2025   DTaP/Tdap/Td (2 - Td or Tdap) 06/09/2027   Colonoscopy  04/02/2031   Hepatitis C Screening  Completed   HPV VACCINES  Aged Out   Meningococcal B Vaccine  Aged Out    Health Maintenance  Health Maintenance Due  Topic Date Due   COVID-19 Vaccine (1) Never done   OPHTHALMOLOGY EXAM   Never done   Zoster Vaccines- Shingrix (1 of 2) Never done   Pneumococcal Vaccine: 50+ Years (2 of 2 - PPSV23, PCV20, or PCV21) 06/30/2017   INFLUENZA VACCINE  02/12/2024   Health Maintenance Items Addressed: Resources for eye doctors provided to patient. Dental Resources also provided to patient.   Additional Screening:  Vision Screening: Recommended annual ophthalmology exams for early detection of glaucoma and other disorders of the eye. Would you like a referral to an eye doctor? Yes    Dental Screening: Recommended annual dental exams  for proper oral hygiene  Community Resource Referral / Chronic Care Management: CRR required this visit?  No   CCM required this visit?  No   Plan:    I have personally reviewed and noted the following in the patient's chart:   Medical and social history Use of alcohol, tobacco or illicit drugs  Current medications and supplements including opioid prescriptions. Patient is not currently taking opioid prescriptions. Functional ability and status Nutritional status Physical activity Advanced directives List of other physicians Hospitalizations, surgeries, and ER visits in previous 12 months Vitals Screenings to include cognitive, depression, and falls Referrals and appointments  In addition, I have reviewed and discussed with patient certain preventive protocols, quality metrics, and best practice recommendations. A written personalized care plan for preventive services as well as general preventive health recommendations were provided to patient.   Aaro Meyers, CMA   03/02/2024   After Visit Summary: (MyChart) Due to this being a telephonic visit, the after visit summary with patients personalized plan was offered to patient via MyChart   Notes: Nothing significant to report at this time.

## 2024-03-02 NOTE — Patient Instructions (Signed)
 [name] , Thank you for taking time out of your busy schedule to complete your Annual Wellness Visit with me. I enjoyed our conversation and look forward to speaking with you again next year. I, as well as your care team,  appreciate your ongoing commitment to your health goals. Please review the following plan we discussed and let me know if I can assist you in the future.  Your Game plan/ To Do List   Follow up Visits: We will see or speak with you next year for your Next Medicare AWV with our clinical staff   Clinician Recommendations:  Aim for 30 minutes of exercise or brisk walking, 6-8 glasses of water , and 5 servings of fruits and vegetables each day.    Wishing you many blessings and good health during the next year until our next visit.  -Leshon Armistead   This is a list of the screenings recommended for you:  Health Maintenance  Topic Date Due   COVID-19 Vaccine (1) Never done   Eye exam for diabetics  Never done   Zoster (Shingles) Vaccine (1 of 2) Never done   Pneumococcal Vaccine for age over 58 (2 of 2 - PPSV23, PCV20, or PCV21) 06/30/2017   Flu Shot  02/12/2024   Complete foot exam   03/03/2024   Hemoglobin A1C  05/12/2024   Yearly kidney function blood test for diabetes  11/10/2024   Yearly kidney health urinalysis for diabetes  11/10/2024   Screening for Lung Cancer  02/11/2025   Medicare Annual Wellness Visit  03/02/2025   DTaP/Tdap/Td vaccine (2 - Td or Tdap) 06/09/2027   Colon Cancer Screening  04/02/2031   Hepatitis C Screening  Completed   HPV Vaccine  Aged Out   Meningitis B Vaccine  Aged Out    Advanced directives: (Declined) Advance directive discussed with you today. Even though you declined this today, please call our office should you change your mind, and we can give you the proper paperwork for you to fill out. Advance Care Planning is important because it:  [x]  Makes sure you receive the medical care that is consistent with your values, goals, and  preferences  [x]  It provides guidance to your family and loved ones and reduces their decisional burden about whether or not they are making the right decisions based on your wishes.  Follow the link provided in your after visit summary or read over the paperwork we have mailed to you to help you started getting your Advance Directives in place. If you need assistance in completing these, please reach out to us  so that we can help you!  See attachments for Preventive Care and Fall Prevention Tips.

## 2024-03-15 ENCOUNTER — Encounter: Payer: Self-pay | Admitting: Internal Medicine

## 2024-03-15 ENCOUNTER — Ambulatory Visit (INDEPENDENT_AMBULATORY_CARE_PROVIDER_SITE_OTHER): Admitting: Internal Medicine

## 2024-03-15 VITALS — BP 138/82 | HR 90 | Ht 69.0 in | Wt 183.0 lb

## 2024-03-15 DIAGNOSIS — E782 Mixed hyperlipidemia: Secondary | ICD-10-CM

## 2024-03-15 DIAGNOSIS — J410 Simple chronic bronchitis: Secondary | ICD-10-CM | POA: Diagnosis not present

## 2024-03-15 DIAGNOSIS — E1169 Type 2 diabetes mellitus with other specified complication: Secondary | ICD-10-CM

## 2024-03-15 DIAGNOSIS — Z2821 Immunization not carried out because of patient refusal: Secondary | ICD-10-CM

## 2024-03-15 DIAGNOSIS — Z7984 Long term (current) use of oral hypoglycemic drugs: Secondary | ICD-10-CM

## 2024-03-15 DIAGNOSIS — G4700 Insomnia, unspecified: Secondary | ICD-10-CM

## 2024-03-15 DIAGNOSIS — I739 Peripheral vascular disease, unspecified: Secondary | ICD-10-CM | POA: Diagnosis not present

## 2024-03-15 DIAGNOSIS — M51362 Other intervertebral disc degeneration, lumbar region with discogenic back pain and lower extremity pain: Secondary | ICD-10-CM

## 2024-03-15 DIAGNOSIS — I5032 Chronic diastolic (congestive) heart failure: Secondary | ICD-10-CM

## 2024-03-15 MED ORDER — TIZANIDINE HCL 2 MG PO TABS: 2.0000 mg | ORAL_TABLET | Freq: Four times a day (QID) | ORAL | 0 refills | Status: DC | PRN

## 2024-03-15 MED ORDER — GABAPENTIN 100 MG PO CAPS: 100.0000 mg | ORAL_CAPSULE | Freq: Every day | ORAL | 3 refills | Status: DC

## 2024-03-15 NOTE — Assessment & Plan Note (Signed)
 Lab Results  Component Value Date   HGBA1C 6.3 (H) 11/11/2023   Well-controlled Associated with HTN, CAD and PAD Had diarrhea and presyncope with metformin  On Jardiance  considering his cardiac history Advised to follow diabetic diet On statin F/u CMP and lipid panel Diabetic eye exam: Advised to follow up with Ophthalmology for diabetic eye exam  Isolated episodes of numbness and tingling of the feet could be due to diabetic neuropathy, he prefers to avoid any medicine for now

## 2024-03-15 NOTE — Assessment & Plan Note (Signed)
On aspirin and statin currently Used to be followed by vascular surgery -also has history of subclavian vein stenosis

## 2024-03-15 NOTE — Progress Notes (Unsigned)
 Established Patient Office Visit  Subjective:  Patient ID: Duane Adams, male    DOB: Nov 28, 1955  Age: 68 y.o. MRN: [id number]  CC:  Chief Complaint  Patient presents with   Diabetes    4 Month f/u    Hypertension    4 month f/u    Insomnia    Reports difficulty sleeping. States muscle relaxer makes him constipated, gives him bad after effect the next day.     HPI Duane Adams is a 68 y.o. male with past medical history of HTN, CAD s/p stent placement, PAD, type 2 DM, HLD, COPD, GERD, liver fibrosis, chronic low back pain and tobacco abuse who presents for f/u of his chronic medical conditions.  CAD, PAD and HTN: BP is well-controlled. Takes medications regularly. Patient denies headache, dizziness, chest pain, or palpitations.  He follows up with cardiology for history of CAD.  He also saw vascular surgery for history of subclavian vein stenosis.  He currently takes Lipitor 80 mg QD for HLD.  He was given fenofibrate  for hypertriglyceridemia, but had severe GI discomfort with it.  He still takes fish oil.  Type II DM: His last HbA1c was 6.3 in 04/25. He has been tolerating Jardiance  well.  He denies any polyuria, polyphagia or chronic fatigue.  COPD: He has noticed improvement in dyspnea since using Breztri  and has needed albuterol  less frequently.  Solitary pulmonary nodule: It was stable compared to previous CT chest.  He currently denies any chronic cough, hemoptysis, recent weight loss, night sweats or LAD.  Chronic low back pain: He reports chronic low back pain, worse with heavy lifting and bending.  His pain is constant, sharp, radiating towards bilateral LE and is associated with numbness of bilateral thigh area and vibrating sensation over right heel area.  Denies any recent injury or fall.  He has been evaluated by [facility] in the past, had steroid injections with mild relief. He did not follow up with them as he does not want to get surgery. He tried  Flexeril , but had drowsiness and constipation with it.  Insomnia: He still reports difficulty maintaining sleep. He wakes up around 3 AM and has difficulty falling asleep then.     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   Concussion several yrs ago   no residual   Coronary artery disease    a. 03/2013 Cath/PCI: LM nl, LAD 9m (2.5x20 Promus Premier DES), LCX min irregs, RCA dom, 90p(3.0x20 Promus Premier DES), EF 55-65%. b. patent stents by caths in 08/2016 and 01/2021   CVA (cerebral vascular accident) (HCC) 01/31/2019   slight memory issues   Essential hypertension, benign    GERD (gastroesophageal reflux disease)    Helicobacter pylori gastritis JUN 2016 EGD/Bx   PREVPAK BID FOR 14 DAYS   Hepatitis    Hepatitis C treated  2011   Hiatal hernia    History of kidney stones    Mixed hyperlipidemia    Neuroma    Prediabetes 06/23/2019   Skin cancer    Substance abuse (HCC)    alcoholic quit 2016   Tobacco abuse     Past Surgical History:  Procedure Laterality Date   ABDOMINAL AORTOGRAM W/LOWER EXTREMITY N/A 06/15/2017   Procedure: ABDOMINAL AORTOGRAM W/LOWER EXTREMITY;  Surgeon: Eliza Lonni RAMAN, MD;  [facility]VASIVE CV [facility];  Service: Cardiovascular;  Laterality: N/A;  Bilateral   BIOPSY N/A 12/19/2014   Procedure: BIOPSY;  Surgeon: Margo LITTIE Haddock, MD;  Location: [facility];  Service: Endoscopy;  Laterality: N/A;   COLONOSCOPY  12/2014   [facility]: with endoscopic mucosal resection. 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 Needs colonoscopy in 1 year.    COLONOSCOPY WITH PROPOFOL  N/A 12/19/2014   Dr. Haddock: six simple adenomas and 3 hyperplastic polyps. Had flat mid-transverse colon polyp and referred to [facility] for endoscopic mucosal resection, which is scheduled for July 22   COLONOSCOPY WITH PROPOFOL  N/A 04/01/2021   Surgeon: Cindie Carlin POUR,  DO;Nonbleeding internal hemorrhoids, diverticulosis in the sigmoid colon, tattoo in transverse colon with tattoo site appearing normal.  Recommended repeat colonoscopy in 5 years.   CORONARY ANGIOPLASTY WITH STENT PLACEMENT  04/11/2013   LAD &  RCA     DR COOPER   ESOPHAGOGASTRODUODENOSCOPY (EGD) WITH PROPOFOL  N/A 12/19/2014   Dr. Haddock:  without varices, small hiatal hernia noted, moderate non-erosive gastritis and mild duodenitis.  POSITIVE H.PYLORI. Prescribed Prevpac.   EXCISION MORTON'S NEUROMA Left 08/15/2020   Procedure: EXCISION MORTON'S NEUROMA THIRD INTERSPACE LEFT FOOT;  Surgeon: Tobie Franky SQUIBB, DPM;  Location: [facility];  Service: Podiatry;  Laterality: Left;   EXTRACORPOREAL SHOCK WAVE LITHOTRIPSY  2001   FLEXIBLE SIGMOIDOSCOPY N/A 12/23/2017   Procedure: FLEXIBLE SIGMOIDOSCOPY;  Surgeon: Haddock Margo LITTIE, MD;  [facility];  Service: Endoscopy;  Laterality: N/A;  1:45pm   HAND RECONSTRUCTION Left    HEMORRHOID BANDING N/A 12/23/2017   Procedure: HEMORRHOID BANDING;  Surgeon: Haddock Margo LITTIE, MD;  Location: [facility];  Service: Endoscopy;  Laterality: N/A;   LEFT HEART CATH AND CORONARY ANGIOGRAPHY N/A 01/22/2021   Procedure: LEFT HEART CATH AND CORONARY ANGIOGRAPHY;  Surgeon: Swazil[place], Peter M, MD;  Location: [facility] INVASIVE CV LAB;  Service: Cardiovascular;  Laterality: N/A;   LEFT HEART CATHETERIZATION WITH CORONARY ANGIOGRAM N/A 04/11/2013   Procedure: LEFT HEART CATHETERIZATION WITH CORONARY ANGIOGRAM;  Surgeon: Ozell JONETTA Fell, MD;  Location: [facility] CATH LAB;  Service: Cardiovascular;  Laterality: N/A;   LEG TENDON SURGERY Right    LIPOMA EXCISION Left 09/13/2021   Procedure: EXCISION LIPOMA; SHOULDER, 4CM;  Surgeon: Evonnie Dorothyann LABOR, DO;  Location: [facility];  Service: General;  Laterality: Left;   MASS EXCISION N/A 09/13/2021   Procedure: EXCISION MASS; BACK, 3CM;  Surgeon: Evonnie Dorothyann LABOR, DO;  Location: [facility];  Service: General;  Laterality:  N/A;   POLYPECTOMY N/A 12/19/2014   Procedure: POLYPECTOMY;  Surgeon: Margo LITTIE Haddock, MD;  Location: [facility];  Service: Endoscopy;  Laterality: N/A;    Family History  Problem Relation Age of Onset   Stroke Mother    Aneurysm Mother    Hypertension Mother    Heart disease Father    Hypertension Father    Heart disease Brother    Hypertension Brother    Alcohol abuse Brother    Diabetes Paternal Aunt    Colon cancer Neg Hx     Social History   Socioeconomic History   Marital status: Significant Other    Spouse name: Delwin   Number of children: 2   Years of education: 14   Highest education level: Not on file  Occupational History   Occupation: disabled    Comment: heart  Tobacco Use   Smoking status: Every Day    Current packs/day: 1.00    Average packs/day: 1 pack/day for 49.1 years (49.1 ttl  pk-yrs)    Types: Cigarettes    Start date: 02/06/1975   Smokeless tobacco: Never   Tobacco comments:    5-6 Cigarettes per day  Vaping Use   Vaping status: Never Used  Substance and Sexual Activity   Alcohol use: Yes    Comment: 2-3 beer a couple times a year (10/21/21) Previously 5-6 beer every 2 weeks.   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   Leisure: Animator   Social Drivers of Corporate investment banker Strain: Low Risk  (03/02/2024)   Overall Financial Resource Strain (CARDIA)    Difficulty of Paying Living Expenses: Not hard at all  Food Insecurity: No Food Insecurity (03/02/2024)   Hunger Vital Sign    Worried About Running Out of Food in the Last Year: Never true    Ran Out of Food in the Last Year: Never true  Transportation Needs: No Transportation Needs (03/02/2024)   PRAPARE - Doctor, general practice (Medical): No    Lack of Transportation (Non-Medical): No  Physical Activity: Patient Declined (03/02/2024)   Exercise Vital Sign    Days of Exercise per Week: Patient declined    Minutes of Exercise per Session: Patient declined  Stress: No Stress Concern Present (03/02/2024)   [employer] of Occupational Health - Occupational Stress Questionnaire    Feeling of Stress: Not at all  Social Connections: Moderately Isolated (03/02/2024)   Social Connection and Isolation Panel    Frequency of Communication with Friends and Family: More than three times a week    Frequency of Social Gatherings with Friends and Family: More than three times a week    Attends Religious Services: Never    Database administrator or Organizations: No    Attends Banker Meetings: Never    Marital Status: Married  Catering manager Violence: Not At Risk (03/02/2024)   Humiliation, Afraid, Rape, and Kick questionnaire    Fear of Current or Ex-Partner: No    Emotionally Abused: No    Physically Abused: No    Sexually Abused: No    Outpatient Medications Prior to Visit  Medication Sig Dispense Refill   amLODipine  (NORVASC ) 10 MG tablet Take 1 tablet (10 mg total) by mouth daily. 90 tablet 0   aspirin  EC 81 MG tablet Take 81 mg by mouth at bedtime.      atorvastatin  (LIPITOR) 80 MG tablet TAKE ONE TABLET BY MOUTH ONCE DAILY. 90 tablet 1   BREZTRI  AEROSPHERE 160-9-4.8 MCG/ACT AERO INHALE 2 PUFFS INTO THE LUNGS 2 TIMES DAILY. 10.7 g 11   chlorthalidone  (HYGROTON ) 25 MG tablet TAKE 1 TABLET BY MOUTH ONCE DAILY. 90 tablet 3   Cholecalciferol (VITAMIN D3) 2000 units TABS Take 4,000 Units by mouth 2 (two) times daily.     cloNIDine  (CATAPRES ) 0.1 MG tablet TAKE (1) TABLET BY MOUTH TWICE DAILY. 180 tablet 3   empagliflozin  (JARDIANCE ) 10 MG TABS tablet Take 1 tablet (10 mg total) by mouth daily. 90 tablet 3   isosorbide  mononitrate (IMDUR ) 30 MG 24 hr tablet TAKE 1 TABLET BY MOUTH ONCE  DAILY. 90 tablet 2   metoprolol  succinate (TOPROL -XL) 100 MG 24  hr tablet TAKE ONE TABLET BY MOUTH ONCE DAILY WITH 50MG . TOTAL OF 150MG  DAILY. 90 tablet 2   metoprolol  succinate (TOPROL -XL) 50 MG 24 hr tablet Take 1 tablet (50 mg total) by mouth daily. Take with or immediately following a meal. 90 tablet 2   Multiple Vitamin (MULTIVITAMIN WITH MINERALS) TABS tablet Take 1 tablet by mouth daily.     nitroGLYCERIN  (NITROSTAT ) 0.4 MG SL tablet Place 1 tablet (0.4 mg total) under the tongue every 5 (five) minutes as needed for chest pain. 25 tablet 3   Omega 3 1200 MG CAPS Take 2,400 mg by mouth daily.     potassium chloride  SA (KLOR-CON  M) 20 MEQ tablet TAKE ONE TABLET BY MOUTH ONCE DAILY. 90 tablet 3   VASCEPA  1 g capsule TAKE 2 CAPSULES BY MOUTH 2 TIMES DAILY. 120 capsule 5   VENTOLIN  HFA 108 (90 Base) MCG/ACT inhaler INHALE TWO PUFFS INTO THE LUNGS EVERY 6 HOURS AS NEEDED FOR WHEEZING OR SHORTNESS OF BREATH 18 g 0   cyclobenzaprine  (FLEXERIL ) 10 MG tablet Take 1 tablet (10 mg total) by mouth at bedtime. 30 tablet 3   No facility-administered medications prior to visit.    Allergies  Allergen Reactions   Lisinopril Swelling    Swelling neck   Crestor [Rosuvastatin] Other (See Comments)    Severe muscle aches   Chantix  [Varenicline ] Other (See Comments)    Abdominal pain   Neosporin [Neomycin-Bacitracin Zn-Polymyx] Rash    blistered    ROS Review of Systems  Constitutional:  Positive for fatigue. Negative for chills and fever.  HENT:  Negative for congestion and sore throat.   Eyes:  Negative for pain and discharge.  Respiratory:  Negative for cough and shortness of breath.   Cardiovascular:  Negative for chest pain and palpitations.  Gastrointestinal:  Negative for diarrhea, nausea and vomiting.  Endocrine: Negative for polydipsia and polyuria.  Genitourinary:  Negative for dysuria and hematuria.  Musculoskeletal:  Positive for arthralgias and back pain. Negative for neck pain and  neck stiffness.  Skin:  Negative for rash.  Neurological:  Negative for weakness and headaches.  Psychiatric/Behavioral:  Negative for agitation and behavioral problems.       Objective:    Physical Exam Vitals reviewed.  Constitutional:      General: He is not in acute distress.    Appearance: He is not diaphoretic.  HENT:     Head: Normocephalic and atraumatic.     Nose: Nose normal.     Mouth/Throat:     Mouth: Mucous membranes are moist.  Eyes:     General: No scleral icterus.    Extraocular Movements: Extraocular movements intact.  Cardiovascular:     Rate and Rhythm: Normal rate and regular rhythm.     Heart sounds: Normal heart sounds. No murmur heard. Pulmonary:     Breath sounds: Normal breath sounds. No wheezing or rales.  Musculoskeletal:     Cervical back: Neck supple. No tenderness.     Lumbar back: Tenderness present. Decreased range of motion.     Right lower leg: No edema.     Left lower leg: No edema.  Skin:    General: Skin is warm.     Findings: No rash.  Neurological:     General: No focal deficit present.     Mental Status: He is alert and oriented to person, place, and time.     Sensory: No sensory deficit.     Motor: No weakness.  Psychiatric:  Mood and Affect: Mood normal.        Behavior: Behavior normal.     BP 138/82 (BP Location: Right Arm)   Pulse 90   Ht 5' 9 (1.753 m)   Wt 183 lb (83 kg)   SpO2 96%   BMI 27.02 kg/m  Wt Readings from Last 3 Encounters:  03/15/24 183 lb (83 kg)  03/02/24 190 lb (86.2 kg)  01/18/24 184 lb 9.6 oz (83.7 kg)    Lab Results  Component Value Date   TSH 1.390 10/10/2022   Lab Results  Component Value Date   WBC 10.1 11/11/2023   HGB 16.6 11/11/2023   HCT 49.1 11/11/2023   MCV 94 11/11/2023   PLT 189 11/11/2023   Lab Results  Component Value Date   NA 144 11/11/2023   K 4.4 11/11/2023   CO2 23 11/11/2023   GLUCOSE 96 11/11/2023   BUN 14 11/11/2023   CREATININE 0.71 (L)  11/11/2023   BILITOT 0.3 11/11/2023   ALKPHOS 61 11/11/2023   AST 17 11/11/2023   ALT 31 11/11/2023   PROT 7.3 11/11/2023   ALBUMIN 4.8 11/11/2023   CALCIUM  9.9 11/11/2023   ANIONGAP 7 03/19/2021   EGFR 101 11/11/2023   Lab Results  Component Value Date   CHOL 169 11/11/2023   Lab Results  Component Value Date   HDL 50 11/11/2023   Lab Results  Component Value Date   LDLCALC 86 11/11/2023   Lab Results  Component Value Date   TRIG 193 (H) 11/11/2023   Lab Results  Component Value Date   CHOLHDL 3.4 11/11/2023   Lab Results  Component Value Date   HGBA1C 6.3 (H) 11/11/2023      Assessment & Plan:   Problem List Items Addressed This Visit       Cardiovascular and Mediastinum   PAD (peripheral artery disease) (HCC)   On aspirin  and statin currently Used to be followed by vascular surgery -also has history of subclavian vein stenosis      Chronic heart failure with preserved ejection fraction (HCC)   Appears euvolemic currently On Jardiance  Followed by cardiology        Respiratory   Simple chronic bronchitis (HCC)   Has been seen by Dr. Shellia Was given Symbicort , but did not help much Well-controlled with Breztri  as maintenance inhaler Has albuterol  inhaler as needed for dyspnea/wheezing        Endocrine   Type 2 Diabetes mellitus with other specified complication - Primary   Lab Results  Component Value Date   HGBA1C 6.3 (H) 11/11/2023   Well-controlled Associated with HTN, CAD and PAD Had diarrhea and presyncope with metformin  On Jardiance  10 mg QD considering his cardiac history Advised to follow diabetic diet - needs dentures for better compliance to diet On statin F/u CMP and lipid panel Diabetic eye exam: Advised to follow up with Ophthalmology for diabetic eye exam  Isolated episodes of numbness and tingling of the feet could be due to diabetic neuropathy, he prefers to avoid any medicine for now      Relevant Orders   Bayer DCA Hb  A1c Waived     Musculoskeletal and Integument   DDD (degenerative disc disease), lumbar   Complains of low back and hip pain Has been seen by Dr. Margrette, and also has completed PT with no relief Referred to spine surgery - but he did not follow-up as he does not want any surgical procedure Avoid heavy lifting and frequent  bending Heating pad as needed Tizanidine  as needed for muscle spasms Started gabapentin  100 mg nightly, can be helpful with neuropathic symptoms Tylenol  as needed for now      Relevant Medications   tiZANidine  (ZANAFLEX ) 2 MG tablet   gabapentin  (NEURONTIN ) 100 MG capsule     Other   Hyperlipidemia   Last lipid profile reviewed On Lipitor currently Has persistently elevated TG, did not tolerate fenofibrate  -takes fish oil On Vascepa  for hypertriglyceridemia      Insomnia   Sleep hygiene discussed His PHQ-9 was elevated, but he reports being fatigued and most of his symptoms related to back pain rather than being depressed Since he was placed on gabapentin  and a muscle relaxer today, would avoid adding any other medicine for now      Other Visit Diagnoses       Refused influenza vaccine            Meds ordered this encounter  Medications   tiZANidine  (ZANAFLEX ) 2 MG tablet    Sig: Take 1 tablet (2 mg total) by mouth every 6 (six) hours as needed for muscle spasms.    Dispense:  30 tablet    Refill:  0    Please cancel Flexeril    gabapentin  (NEURONTIN ) 100 MG capsule    Sig: Take 1 capsule (100 mg total) by mouth at bedtime.    Dispense:  30 capsule    Refill:  3    Follow-up: Return in about 4 months (around 07/15/2024) for HTN and DM.    Suzzane MARLA Blanch, MD

## 2024-03-15 NOTE — Assessment & Plan Note (Signed)
Appears euvolemic currently On Jardiance Followed by cardiology 

## 2024-03-15 NOTE — Assessment & Plan Note (Signed)
 Last lipid profile reviewed On Lipitor currently Has persistently elevated TG, did not tolerate fenofibrate  -takes fish oil On Vascepa  for hypertriglyceridemia

## 2024-03-15 NOTE — Patient Instructions (Signed)
 Please start taking Gabapentin  for back pain and tingling of the feet.  Please take Tizanidine  as needed for back muscle spasms.  Please use heating pad and/or back brace as needed for back pain.  Please continue to take medications as prescribed.  Please continue to follow low carb diet and perform moderate exercise/walking as tolerated.

## 2024-03-15 NOTE — Assessment & Plan Note (Signed)
Has been seen by Dr. Craige Cotta Was given Symbicort, but did not help much Well-controlled with [facility] as maintenance inhaler Has albuterol inhaler as needed for dyspnea/wheezing

## 2024-03-16 NOTE — Assessment & Plan Note (Addendum)
 Sleep hygiene discussed His PHQ-9 was elevated, but he reports being fatigued and most of his symptoms related to back pain rather than being depressed Since he was placed on gabapentin  and a muscle relaxer today, would avoid adding any other medicine for now

## 2024-03-16 NOTE — Assessment & Plan Note (Signed)
 Complains of low back and hip pain Has been seen by Dr. Margrette, and also has completed PT with no relief Referred to spine surgery - but he did not follow-up as he does not want any surgical procedure Avoid heavy lifting and frequent bending Heating pad as needed Tizanidine  as needed for muscle spasms Started gabapentin  100 mg nightly, can be helpful with neuropathic symptoms Tylenol  as needed for now

## 2024-03-17 ENCOUNTER — Ambulatory Visit: Payer: Self-pay | Admitting: Internal Medicine

## 2024-03-17 LAB — BAYER DCA HB A1C WAIVED: HB A1C (BAYER DCA - WAIVED): 5.7 % — ABNORMAL HIGH (ref 4.8–5.6)

## 2024-03-18 ENCOUNTER — Other Ambulatory Visit: Payer: Self-pay | Admitting: Internal Medicine

## 2024-03-18 ENCOUNTER — Other Ambulatory Visit: Payer: Self-pay | Admitting: Cardiology

## 2024-03-18 DIAGNOSIS — J410 Simple chronic bronchitis: Secondary | ICD-10-CM

## 2024-04-12 ENCOUNTER — Other Ambulatory Visit: Payer: Self-pay | Admitting: Cardiology

## 2024-04-12 ENCOUNTER — Other Ambulatory Visit: Payer: Self-pay | Admitting: Internal Medicine

## 2024-04-12 DIAGNOSIS — I5032 Chronic diastolic (congestive) heart failure: Secondary | ICD-10-CM

## 2024-04-12 DIAGNOSIS — E1169 Type 2 diabetes mellitus with other specified complication: Secondary | ICD-10-CM

## 2024-04-14 ENCOUNTER — Other Ambulatory Visit: Payer: Self-pay | Admitting: Cardiology

## 2024-04-18 ENCOUNTER — Other Ambulatory Visit: Payer: Self-pay | Admitting: Internal Medicine

## 2024-04-18 DIAGNOSIS — J410 Simple chronic bronchitis: Secondary | ICD-10-CM

## 2024-04-22 ENCOUNTER — Other Ambulatory Visit: Payer: Self-pay | Admitting: Cardiology

## 2024-04-23 ENCOUNTER — Other Ambulatory Visit: Payer: Self-pay | Admitting: Internal Medicine

## 2024-04-23 ENCOUNTER — Other Ambulatory Visit: Payer: Self-pay | Admitting: Cardiology

## 2024-04-23 DIAGNOSIS — M51362 Other intervertebral disc degeneration, lumbar region with discogenic back pain and lower extremity pain: Secondary | ICD-10-CM

## 2024-04-25 ENCOUNTER — Other Ambulatory Visit: Payer: Self-pay | Admitting: Internal Medicine

## 2024-04-25 DIAGNOSIS — M51362 Other intervertebral disc degeneration, lumbar region with discogenic back pain and lower extremity pain: Secondary | ICD-10-CM

## 2024-04-25 MED ORDER — TIZANIDINE HCL 2 MG PO TABS: 2.0000 mg | ORAL_TABLET | Freq: Four times a day (QID) | ORAL | 3 refills | Status: DC | PRN

## 2024-05-09 ENCOUNTER — Other Ambulatory Visit: Payer: Self-pay

## 2024-05-09 ENCOUNTER — Other Ambulatory Visit: Payer: Self-pay | Admitting: Internal Medicine

## 2024-05-09 DIAGNOSIS — J410 Simple chronic bronchitis: Secondary | ICD-10-CM

## 2024-05-10 MED ORDER — ATORVASTATIN CALCIUM 80 MG PO TABS: 80.0000 mg | ORAL_TABLET | Freq: Every day | ORAL | 2 refills | Status: AC

## 2024-05-31 ENCOUNTER — Other Ambulatory Visit: Payer: Self-pay | Admitting: Internal Medicine

## 2024-05-31 DIAGNOSIS — J410 Simple chronic bronchitis: Secondary | ICD-10-CM

## 2024-07-18 ENCOUNTER — Other Ambulatory Visit: Payer: Self-pay | Admitting: Internal Medicine

## 2024-07-18 DIAGNOSIS — E782 Mixed hyperlipidemia: Secondary | ICD-10-CM

## 2024-07-19 ENCOUNTER — Ambulatory Visit (INDEPENDENT_AMBULATORY_CARE_PROVIDER_SITE_OTHER): Payer: Medicare (Managed Care) | Admitting: Internal Medicine

## 2024-07-19 ENCOUNTER — Encounter: Payer: Self-pay | Admitting: Internal Medicine

## 2024-07-19 VITALS — BP 125/75 | HR 83 | Ht 69.0 in | Wt 183.6 lb

## 2024-07-19 DIAGNOSIS — M51362 Other intervertebral disc degeneration, lumbar region with discogenic back pain and lower extremity pain: Secondary | ICD-10-CM

## 2024-07-19 DIAGNOSIS — J410 Simple chronic bronchitis: Secondary | ICD-10-CM | POA: Diagnosis not present

## 2024-07-19 DIAGNOSIS — E1159 Type 2 diabetes mellitus with other circulatory complications: Secondary | ICD-10-CM | POA: Diagnosis not present

## 2024-07-19 DIAGNOSIS — R269 Unspecified abnormalities of gait and mobility: Secondary | ICD-10-CM | POA: Diagnosis not present

## 2024-07-19 DIAGNOSIS — E1151 Type 2 diabetes mellitus with diabetic peripheral angiopathy without gangrene: Secondary | ICD-10-CM

## 2024-07-19 DIAGNOSIS — E1169 Type 2 diabetes mellitus with other specified complication: Secondary | ICD-10-CM | POA: Diagnosis not present

## 2024-07-19 DIAGNOSIS — I5032 Chronic diastolic (congestive) heart failure: Secondary | ICD-10-CM | POA: Diagnosis not present

## 2024-07-19 DIAGNOSIS — I739 Peripheral vascular disease, unspecified: Secondary | ICD-10-CM

## 2024-07-19 DIAGNOSIS — E782 Mixed hyperlipidemia: Secondary | ICD-10-CM | POA: Diagnosis not present

## 2024-07-19 DIAGNOSIS — R5382 Chronic fatigue, unspecified: Secondary | ICD-10-CM

## 2024-07-19 DIAGNOSIS — I10 Essential (primary) hypertension: Secondary | ICD-10-CM

## 2024-07-19 DIAGNOSIS — G4700 Insomnia, unspecified: Secondary | ICD-10-CM

## 2024-07-19 DIAGNOSIS — I110 Hypertensive heart disease with heart failure: Secondary | ICD-10-CM | POA: Diagnosis not present

## 2024-07-19 MED ORDER — ALBUTEROL SULFATE HFA 108 (90 BASE) MCG/ACT IN AERS: 2.0000 | INHALATION_SPRAY | Freq: Four times a day (QID) | RESPIRATORY_TRACT | 3 refills | Status: AC | PRN

## 2024-07-19 MED ORDER — EMPAGLIFLOZIN 10 MG PO TABS: 10.0000 mg | ORAL_TABLET | Freq: Every day | ORAL | 3 refills | Status: AC

## 2024-07-19 MED ORDER — AMLODIPINE BESYLATE 10 MG PO TABS: 10.0000 mg | ORAL_TABLET | Freq: Every day | ORAL | 3 refills | Status: AC

## 2024-07-19 MED ORDER — TRAZODONE HCL 50 MG PO TABS: 25.0000 mg | ORAL_TABLET | Freq: Every evening | ORAL | 3 refills | Status: AC | PRN

## 2024-07-19 MED ORDER — BREZTRI AEROSPHERE 160-9-4.8 MCG/ACT IN AERO: 2.0000 | INHALATION_SPRAY | Freq: Two times a day (BID) | RESPIRATORY_TRACT | 3 refills | Status: AC

## 2024-07-19 MED ORDER — TIZANIDINE HCL 2 MG PO TABS: 2.0000 mg | ORAL_TABLET | Freq: Four times a day (QID) | ORAL | 3 refills | Status: AC | PRN

## 2024-07-19 NOTE — Assessment & Plan Note (Signed)
 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

## 2024-07-19 NOTE — Assessment & Plan Note (Signed)
 Last lipid profile reviewed On Lipitor currently Has persistently elevated TG, did not tolerate fenofibrate  -takes fish oil On Vascepa  for hypertriglyceridemia

## 2024-07-19 NOTE — Assessment & Plan Note (Signed)
 Likely multifactorial in the setting of COPD, CHF, type II DM and multiple medications Check CBC, CMP, HbA1c, vitamin D  TSH has been WNL in the past Maintain adequate hydration and eat at regular intervals Lack of sleep can also contribute to chronic fatigue

## 2024-07-19 NOTE — Assessment & Plan Note (Signed)
On aspirin and statin currently Used to be followed by vascular surgery -also has history of subclavian vein stenosis

## 2024-07-19 NOTE — Assessment & Plan Note (Signed)
 Sleep hygiene discussed Started trazodone  as needed for insomnia

## 2024-07-19 NOTE — Assessment & Plan Note (Signed)
 BP Readings from Last 1 Encounters:  07/19/24 125/75   Usually well-controlled with Amlodipine  10 mg QD, Metoprolol  150 mg QD, Chlorthalidone  25 mg QD, Imdur  30 mg QD and Clonidine  0.1 mg BID Counseled for compliance with the medications Advised DASH diet and moderate exercise/walking, at least 150 mins/week

## 2024-07-19 NOTE — Patient Instructions (Addendum)
 Please start taking Trazodone  as prescribed.  Please continue to take medications as prescribed.  Please continue to follow low carb diet and perform moderate exercise/walking at least 150 mins/week.  Please maintain simple sleep hygiene. - Maintain dark and non-noisy environment in the bedroom. - Please use the bedroom for sleep and sexual activity only. - Do not use electronic devices in the bedroom. - Please take dinner at least 2 hours before bedtime. - Please avoid caffeinated products in the evening, including coffee, soft drinks. - Please try to maintain the regular sleep-wake cycle - Go to bed and wake up at the same time.

## 2024-07-19 NOTE — Assessment & Plan Note (Signed)
Appears euvolemic currently On Jardiance Followed by cardiology 

## 2024-07-19 NOTE — Assessment & Plan Note (Signed)
 Complains of low back and hip pain Has been seen by Dr. Margrette, and also has completed PT with no relief Referred to spine surgery - but he did not follow-up as he does not want any surgical procedure Avoid heavy lifting and frequent bending Heating pad as needed Tizanidine  as needed for muscle spasms Had started gabapentin  100 mg nightly, can be helpful with neuropathic symptoms - but reports drowsiness, unclear if he is confused with Flexeril , would discontinue for now Tylenol  as needed for now

## 2024-07-19 NOTE — Assessment & Plan Note (Signed)
 Lab Results  Component Value Date   HGBA1C 5.7 (H) 03/15/2024   Well-controlled Associated with HTN, CAD and PAD Had diarrhea and presyncope with metformin  On Jardiance  10 mg QD considering his cardiac history Advised to follow diabetic diet - needs dentures for better compliance to diet On statin F/u CMP and lipid panel Diabetic eye exam: Advised to follow up with Ophthalmology for diabetic eye exam  Isolated episodes of numbness and tingling of the feet could be due to diabetic neuropathy, he prefers to avoid any medicine for now

## 2024-07-19 NOTE — Progress Notes (Signed)
 "  Established Patient Office Visit  Subjective:  Patient ID: Duane Adams, male    DOB: 02-Apr-1956  Age: 69 y.o. MRN: [id number]  CC:  Chief Complaint  Patient presents with   Follow-up    Reports feeling fatigued not much energy.    Gait Problem    Bumping onto objects, difficulty with his equipments   Diabetes   Hypertension    HPI Duane Adams is a 69 y.o. male with past medical history of HTN, CAD s/p stent placement, PAD, type 2 DM, HLD, COPD, GERD, liver fibrosis, chronic low back pain and tobacco abuse who presents for f/u of his chronic medical conditions.  CAD, PAD and HTN: BP is well-controlled. Takes medications regularly. Patient denies headache, dizziness, chest pain, or palpitations.  He follows up with cardiology for history of CAD.  He also saw vascular surgery for history of subclavian vein stenosis.  He currently takes Lipitor 80 mg QD for HLD.  He was given fenofibrate  for hypertriglyceridemia, but had severe GI discomfort with it.  He still takes fish oil.  Type II DM: His last HbA1c was 5.7 in 09/25. He has been tolerating Jardiance  well.  He denies any polyuria, polyphagia or chronic fatigue.  COPD: He has noticed improvement in dyspnea since using Breztri  and has needed albuterol  less frequently.  Solitary pulmonary nodule: It was stable compared to previous CT chest.  He currently denies any chronic cough, hemoptysis, recent weight loss, night sweats or LAD.  Chronic low back pain: He reports chronic low back pain, worse with heavy lifting and bending.  His pain is constant, sharp, radiating towards bilateral LE and is associated with numbness of bilateral thigh area and vibrating sensation over right heel area.  Denies any recent injury or fall.  He has been evaluated by [facility] in the past, had steroid injections with mild relief. He did not follow up with them as he does not want to get surgery. He tried Flexeril , but had drowsiness and  constipation with it.  He is currently taking tizanidine  with adequate relief.  Insomnia: He still reports difficulty maintaining sleep. He wakes up around 3 AM and has difficulty falling asleep then.  He reports chronic fatigue, likely due to lack of sleep.  He reports gait disturbance, swaying on sides at times for the last 1 year.  He has noticed difficulty with his equipments and gets fear that he would hurt himself while using his equipment.  He has been dropping objects at times as well.  Reports family history of multiple sclerosis in his uncle.     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   Concussion several yrs ago   no residual   Coronary artery disease    a. 03/2013 Cath/PCI: LM nl, LAD 3m (2.5x20 Promus Premier DES), LCX min irregs, RCA dom, 90p(3.0x20 Promus Premier DES), EF 55-65%. b. patent stents by caths in 08/2016 and 01/2021   CVA (cerebral vascular accident) (HCC) 01/31/2019   slight memory issues   Essential hypertension, benign    GERD (gastroesophageal reflux disease)    Helicobacter pylori gastritis JUN 2016 EGD/Bx   PREVPAK BID FOR 14 DAYS   Hepatitis    Hepatitis C treated  2011   Hiatal hernia    History of kidney stones    Mixed hyperlipidemia    Neuroma  Prediabetes 06/23/2019   Skin cancer    Substance abuse (HCC)    alcoholic quit 2016   Tobacco abuse     Past Surgical History:  Procedure Laterality Date   ABDOMINAL AORTOGRAM W/LOWER EXTREMITY N/A 06/15/2017   Procedure: ABDOMINAL AORTOGRAM W/LOWER EXTREMITY;  Surgeon: Eliza Lonni RAMAN, MD;  Location: [facility] INVASIVE CV LAB;  Service: Cardiovascular;  Laterality: N/A;  Bilateral   BIOPSY N/A 12/19/2014   Procedure: BIOPSY;  Surgeon: Margo LITTIE Haddock, MD;  Location: [facility];  Service: Endoscopy;  Laterality: N/A;   COLONOSCOPY  12/2014   [facility]: with endoscopic mucosal resection. Path with tubular  adenoma and focal high grade dysplasia. Needs colonoscopy in 1 year.    COLONOSCOPY WITH PROPOFOL  N/A 12/19/2014   Dr. Haddock: six simple adenomas and 3 hyperplastic polyps. Had flat mid-transverse colon polyp and referred to [facility] for endoscopic mucosal resection, which is scheduled for July 22   COLONOSCOPY WITH PROPOFOL  N/A 04/01/2021   Surgeon: Cindie Carlin POUR, DO;Nonbleeding internal hemorrhoids, diverticulosis in the sigmoid colon, tattoo in transverse colon with tattoo site appearing normal.  Recommended repeat colonoscopy in 5 years.   CORONARY ANGIOPLASTY WITH STENT PLACEMENT  04/11/2013   LAD &  RCA     DR COOPER   ESOPHAGOGASTRODUODENOSCOPY (EGD) WITH PROPOFOL  N/A 12/19/2014   Dr. Haddock:  without varices, small hiatal hernia noted, moderate non-erosive gastritis and mild duodenitis.  POSITIVE H.PYLORI. Prescribed Prevpac.   EXCISION MORTON'S NEUROMA Left 08/15/2020   Procedure: EXCISION MORTON'S NEUROMA THIRD INTERSPACE LEFT FOOT;  Surgeon: Tobie Franky SQUIBB, DPM;  Location: [facility];  Service: Podiatry;  Laterality: Left;   EXTRACORPOREAL SHOCK WAVE LITHOTRIPSY  2001   FLEXIBLE SIGMOIDOSCOPY N/A 12/23/2017   Procedure: FLEXIBLE SIGMOIDOSCOPY;  Surgeon: Haddock Margo LITTIE, MD;  Location: [facility];  Service: Endoscopy;  Laterality: N/A;  1:45pm   HAND RECONSTRUCTION Left    HEMORRHOID BANDING N/A 12/23/2017   Procedure: HEMORRHOID BANDING;  Surgeon: Haddock Margo LITTIE, MD;  Location: [facility];  Service: Endoscopy;  Laterality: N/A;   LEFT HEART CATH AND CORONARY ANGIOGRAPHY N/A 01/22/2021   Procedure: LEFT HEART CATH AND CORONARY ANGIOGRAPHY;  Surgeon: Jordan, Peter M, MD;  Location: [facility] INVASIVE CV LAB;  Service: Cardiovascular;  Laterality: N/A;   LEFT HEART CATHETERIZATION WITH CORONARY ANGIOGRAM N/A 04/11/2013   Procedure: LEFT HEART CATHETERIZATION WITH CORONARY ANGIOGRAM;  Surgeon: Ozell JONETTA Fell, MD;  Location: [facility] CATH LAB;  Service: Cardiovascular;   Laterality: N/A;   LEG TENDON SURGERY Right    LIPOMA EXCISION Left 09/13/2021   Procedure: EXCISION LIPOMA; SHOULDER, 4CM;  Surgeon: Evonnie Dorothyann LABOR, DO;  Location: [facility];  Service: General;  Laterality: Left;   MASS EXCISION N/A 09/13/2021   Procedure: EXCISION MASS; BACK, 3CM;  Surgeon: Evonnie Dorothyann LABOR, DO;  Location: [facility];  Service: General;  Laterality: N/A;   POLYPECTOMY N/A 12/19/2014   Procedure: POLYPECTOMY;  Surgeon: Margo LITTIE Haddock, MD;  Location: [facility];  Service: Endoscopy;  Laterality: N/A;    Family History  Problem Relation Age of Onset   Stroke Mother    Aneurysm Mother    Hypertension Mother    Heart disease Father    Hypertension Father    Heart disease Brother    Hypertension Brother    Alcohol abuse Brother    Diabetes Paternal Aunt    Colon cancer Neg Hx     Social History   Socioeconomic History   Marital status: Significant Other  Spouse name: Delwin   Number of children: 2   Years of education: 14   Highest education level: Not on file  Occupational History   Occupation: disabled    Comment: heart  Tobacco Use   Smoking status: Every Day    Current packs/day: 1.00    Average packs/day: 1 pack/day for 49.4 years (49.4 ttl pk-yrs)    Types: Cigarettes    Start date: 02/06/1975   Smokeless tobacco: Never   Tobacco comments:    5-6 Cigarettes per day  Vaping Use   Vaping status: Never Used  Substance and Sexual Activity   Alcohol use: Yes    Comment: 2-3 beer a couple times a year (10/21/21) Previously 5-6 beer every 2 weeks.   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   Leisure: computer   Social Drivers of Health   Tobacco Use: High Risk (07/19/2024)   Patient History     Smoking Tobacco Use: Every Day    Smokeless Tobacco Use: Never    Passive Exposure: Not on file  Financial Resource Strain: Low Risk (03/02/2024)   Overall Financial Resource Strain (CARDIA)    Difficulty of Paying Living Expenses: Not hard at all  Food Insecurity: No Food Insecurity (03/02/2024)   Epic    Worried About Radiation Protection Practitioner of Food in the Last Year: Never true    Ran Out of Food in the Last Year: Never true  Transportation Needs: No Transportation Needs (03/02/2024)   Epic    Lack of Transportation (Medical): No    Lack of Transportation (Non-Medical): No  Physical Activity: Patient Declined (03/02/2024)   Exercise Vital Sign    Days of Exercise per Week: Patient declined    Minutes of Exercise per Session: Patient declined  Stress: No Stress Concern Present (03/02/2024)   [employer] of Occupational Health - Occupational Stress Questionnaire    Feeling of Stress: Not at all  Social Connections: Moderately Isolated (03/02/2024)   Social Connection and Isolation Panel    Frequency of Communication with Friends and Family: More than three times a week    Frequency of Social Gatherings with Friends and Family: More than three times a week    Attends Religious Services: Never    Database Administrator or Organizations: No    Attends Banker Meetings: Never    Marital Status: Married  Catering Manager Violence: Not At Risk (03/02/2024)   Epic    Fear of Current or Ex-Partner: No    Emotionally Abused: No    Physically Abused: No    Sexually Abused: No  Depression (PHQ2-9): Medium Risk (07/19/2024)   Depression (PHQ2-9)    PHQ-2 Score: 9  Alcohol Screen: Low Risk (03/02/2024)   Alcohol Screen    Last Alcohol Screening Score (AUDIT): 3  Housing: Low Risk (03/02/2024)   Epic    Unable to Pay for Housing in the Last Year: No    Number of Times Moved in the Last Year: 0    Homeless in the Last Year: No  Utilities: Not At Risk (03/02/2024)   Epic    Threatened  with loss of utilities: No  Health Literacy: Adequate Health Literacy (  03/02/2024)   B1300 Health Literacy    Frequency of need for help with medical instructions: Never    Outpatient Medications Prior to Visit  Medication Sig Dispense Refill   aspirin  EC 81 MG tablet Take 81 mg by mouth at bedtime.      atorvastatin  (LIPITOR) 80 MG tablet Take 1 tablet (80 mg total) by mouth daily. 90 tablet 2   chlorthalidone  (HYGROTON ) 25 MG tablet TAKE 1 TABLET BY MOUTH ONCE DAILY. 90 tablet 3   Cholecalciferol (VITAMIN D3) 2000 units TABS Take 4,000 Units by mouth 2 (two) times daily.     cloNIDine  (CATAPRES ) 0.1 MG tablet TAKE (1) TABLET BY MOUTH TWICE DAILY. 180 tablet 2   isosorbide  mononitrate (IMDUR ) 30 MG 24 hr tablet TAKE 1 TABLET BY MOUTH ONCE DAILY. 90 tablet 3   metoprolol  succinate (TOPROL -XL) 100 MG 24 hr tablet TAKE ONE TABLET BY MOUTH ONCE DAILY WITH 50MG . TOTAL OF 150MG  DAILY. 90 tablet 2   metoprolol  succinate (TOPROL -XL) 50 MG 24 hr tablet Take 1 tablet (50 mg total) by mouth daily. Take with or immediately following a meal. 90 tablet 2   Multiple Vitamin (MULTIVITAMIN WITH MINERALS) TABS tablet Take 1 tablet by mouth daily.     nitroGLYCERIN  (NITROSTAT ) 0.4 MG SL tablet Place 1 tablet (0.4 mg total) under the tongue every 5 (five) minutes as needed for chest pain. 25 tablet 3   Omega 3 1200 MG CAPS Take 2,400 mg by mouth daily.     potassium chloride  SA (KLOR-CON  M) 20 MEQ tablet TAKE ONE TABLET BY MOUTH ONCE DAILY. 90 tablet 3   VASCEPA  1 g capsule TAKE 2 CAPSULES BY MOUTH 2 TIMES DAILY. 120 capsule 5   amLODipine  (NORVASC ) 10 MG tablet TAKE ONE TABLET BY MOUTH DAILY 90 tablet 0   BREZTRI  AEROSPHERE 160-9-4.8 MCG/ACT AERO INHALE 2 PUFFS INTO THE LUNGS 2 TIMES DAILY. 10.7 g 11   gabapentin  (NEURONTIN ) 100 MG capsule Take 1 capsule (100 mg total) by mouth at bedtime. 30 capsule 3   JARDIANCE  10 MG TABS tablet Take 1 tablet (10 mg total) by mouth daily. 90 tablet 3   tiZANidine  (ZANAFLEX )  2 MG tablet Take 1 tablet (2 mg total) by mouth every 6 (six) hours as needed for muscle spasms. 30 tablet 3   VENTOLIN  HFA 108 (90 Base) MCG/ACT inhaler INHALE TWO PUFFS INTO THE LUNGS EVERY 6 HOURS AS NEEDED FOR WHEEZING OR SHORTNESS OF BREATH 18 g 0   No facility-administered medications prior to visit.    Allergies  Allergen Reactions   Lisinopril Swelling    Swelling neck   Crestor [Rosuvastatin] Other (See Comments)    Severe muscle aches   Chantix  [Varenicline ] Other (See Comments)    Abdominal pain   Neosporin [Neomycin-Bacitracin Zn-Polymyx] Rash    blistered    ROS Review of Systems  Constitutional:  Positive for fatigue. Negative for chills and fever.  HENT:  Negative for congestion and sore throat.   Eyes:  Negative for pain and discharge.  Respiratory:  Negative for cough and shortness of breath.   Cardiovascular:  Negative for chest pain and palpitations.  Gastrointestinal:  Negative for diarrhea, nausea and vomiting.  Endocrine: Negative for polydipsia and polyuria.  Genitourinary:  Negative for dysuria and hematuria.  Musculoskeletal:  Positive for arthralgias and back pain. Negative for neck pain and neck stiffness.  Skin:  Negative for rash.  Neurological:  Negative for weakness and headaches.  Psychiatric/Behavioral:  Positive for sleep disturbance.  Negative for agitation and behavioral problems.       Objective:    Physical Exam Vitals reviewed.  Constitutional:      General: He is not in acute distress.    Appearance: He is not diaphoretic.  HENT:     Head: Normocephalic and atraumatic.     Nose: Nose normal.     Mouth/Throat:     Mouth: Mucous membranes are moist.  Eyes:     General: No scleral icterus.    Extraocular Movements: Extraocular movements intact.  Cardiovascular:     Rate and Rhythm: Normal rate and regular rhythm.     Heart sounds: Normal heart sounds. No murmur heard. Pulmonary:     Breath sounds: Normal breath sounds. No  wheezing or rales.  Musculoskeletal:     Cervical back: Neck supple. No tenderness.     Lumbar back: Tenderness present. Decreased range of motion.     Right lower leg: No edema.     Left lower leg: No edema.  Skin:    General: Skin is warm.     Findings: No rash.  Neurological:     General: No focal deficit present.     Mental Status: He is alert and oriented to person, place, and time.     Sensory: No sensory deficit.     Motor: No weakness.  Psychiatric:        Mood and Affect: Mood normal.        Behavior: Behavior normal.     BP 125/75   Pulse 83   Ht 5' 9 (1.753 m)   Wt 183 lb 9.6 oz (83.3 kg)   SpO2 98%   BMI 27.11 kg/m  Wt Readings from Last 3 Encounters:  07/19/24 183 lb 9.6 oz (83.3 kg)  03/15/24 183 lb (83 kg)  03/02/24 190 lb (86.2 kg)    Lab Results  Component Value Date   TSH 1.390 10/10/2022   Lab Results  Component Value Date   WBC 10.1 11/11/2023   HGB 16.6 11/11/2023   HCT 49.1 11/11/2023   MCV 94 11/11/2023   PLT 189 11/11/2023   Lab Results  Component Value Date   NA 144 11/11/2023   K 4.4 11/11/2023   CO2 23 11/11/2023   GLUCOSE 96 11/11/2023   BUN 14 11/11/2023   CREATININE 0.71 (L) 11/11/2023   BILITOT 0.3 11/11/2023   ALKPHOS 61 11/11/2023   AST 17 11/11/2023   ALT 31 11/11/2023   PROT 7.3 11/11/2023   ALBUMIN 4.8 11/11/2023   CALCIUM  9.9 11/11/2023   ANIONGAP 7 03/19/2021   EGFR 101 11/11/2023   Lab Results  Component Value Date   CHOL 169 11/11/2023   Lab Results  Component Value Date   HDL 50 11/11/2023   Lab Results  Component Value Date   LDLCALC 86 11/11/2023   Lab Results  Component Value Date   TRIG 193 (H) 11/11/2023   Lab Results  Component Value Date   CHOLHDL 3.4 11/11/2023   Lab Results  Component Value Date   HGBA1C 5.7 (H) 03/15/2024      Assessment & Plan:   Problem List Items Addressed This Visit       Cardiovascular and Mediastinum   Essential hypertension   BP Readings from Last  1 Encounters:  07/19/24 125/75   Usually well-controlled with Amlodipine  10 mg QD, Metoprolol  150 mg QD, Chlorthalidone  25 mg QD, Imdur  30 mg QD and Clonidine  0.1 mg BID Counseled for compliance with  the medications Advised DASH diet and moderate exercise/walking, at least 150 mins/week      Relevant Medications   amLODipine  (NORVASC ) 10 MG tablet   PAD (peripheral artery disease)   On aspirin  and statin currently Used to be followed by vascular surgery -also has history of subclavian vein stenosis      Relevant Medications   amLODipine  (NORVASC ) 10 MG tablet   Chronic heart failure with preserved ejection fraction (HCC)   Appears euvolemic currently On Jardiance  Followed by cardiology      Relevant Medications   empagliflozin  (JARDIANCE ) 10 MG TABS tablet   amLODipine  (NORVASC ) 10 MG tablet   Other Relevant Orders   CMP14+EGFR   CBC with Differential/Platelet   TSH + free T4     Respiratory   Simple chronic bronchitis (HCC)   Has been seen by Dr. Shellia Was given Symbicort , but did not help much Well-controlled with Breztri  as maintenance inhaler Has albuterol  inhaler as needed for dyspnea/wheezing      Relevant Medications   budesonide -glycopyrrolate-formoterol  (BREZTRI  AEROSPHERE) 160-9-4.8 MCG/ACT AERO inhaler   albuterol  (VENTOLIN  HFA) 108 (90 Base) MCG/ACT inhaler     Endocrine   Type 2 Diabetes mellitus with other specified complication - Primary   Lab Results  Component Value Date   HGBA1C 5.7 (H) 03/15/2024   Well-controlled Associated with HTN, CAD and PAD Had diarrhea and presyncope with metformin  On Jardiance  10 mg QD considering his cardiac history Advised to follow diabetic diet - needs dentures for better compliance to diet On statin F/u CMP and lipid panel Diabetic eye exam: Advised to follow up with Ophthalmology for diabetic eye exam  Isolated episodes of numbness and tingling of the feet could be due to diabetic neuropathy, he prefers to  avoid any medicine for now      Relevant Medications   empagliflozin  (JARDIANCE ) 10 MG TABS tablet   Other Relevant Orders   CMP14+EGFR   Hemoglobin A1c     Musculoskeletal and Integument   DDD (degenerative disc disease), lumbar   Complains of low back and hip pain Has been seen by Dr. Margrette, and also has completed PT with no relief Referred to spine surgery - but he did not follow-up as he does not want any surgical procedure Avoid heavy lifting and frequent bending Heating pad as needed Tizanidine  as needed for muscle spasms Had started gabapentin  100 mg nightly, can be helpful with neuropathic symptoms - but reports drowsiness, unclear if he is confused with Flexeril , would discontinue for now Tylenol  as needed for now      Relevant Medications   tiZANidine  (ZANAFLEX ) 2 MG tablet     Other   Hyperlipidemia   Last lipid profile reviewed On Lipitor currently Has persistently elevated TG, did not tolerate fenofibrate  -takes fish oil On Vascepa  for hypertriglyceridemia      Relevant Medications   amLODipine  (NORVASC ) 10 MG tablet   Other Relevant Orders   Lipid Profile   Insomnia   Sleep hygiene discussed Started trazodone  as needed for insomnia      Relevant Medications   traZODone  (DESYREL ) 50 MG tablet   Chronic fatigue   Likely multifactorial in the setting of COPD, CHF, type II DM and multiple medications Check CBC, CMP, HbA1c, vitamin D  TSH has been WNL in the past Maintain adequate hydration and eat at regular intervals Lack of sleep can also contribute to chronic fatigue      Relevant Orders   TSH + free T4   Gait  disturbance   Has noticed changes in gait, gets tripped on objects, drops objects and reports coordination problems while working with his equipments Unclear if related to neuropathy vs movement disorder Referred to Neurology for further evaluation      Relevant Orders   Ambulatory referral to Neurology      Meds ordered this  encounter  Medications   traZODone  (DESYREL ) 50 MG tablet    Sig: Take 0.5-1 tablets (25-50 mg total) by mouth at bedtime as needed for sleep.    Dispense:  30 tablet    Refill:  3   budesonide -glycopyrrolate-formoterol  (BREZTRI  AEROSPHERE) 160-9-4.8 MCG/ACT AERO inhaler    Sig: Inhale 2 puffs into the lungs 2 (two) times daily.    Dispense:  32.1 g    Refill:  3   albuterol  (VENTOLIN  HFA) 108 (90 Base) MCG/ACT inhaler    Sig: Inhale 2 puffs into the lungs every 6 (six) hours as needed for wheezing or shortness of breath.    Dispense:  18 g    Refill:  3   tiZANidine  (ZANAFLEX ) 2 MG tablet    Sig: Take 1 tablet (2 mg total) by mouth every 6 (six) hours as needed for muscle spasms.    Dispense:  30 tablet    Refill:  3   empagliflozin  (JARDIANCE ) 10 MG TABS tablet    Sig: Take 1 tablet (10 mg total) by mouth daily.    Dispense:  90 tablet    Refill:  3   amLODipine  (NORVASC ) 10 MG tablet    Sig: Take 1 tablet (10 mg total) by mouth daily.    Dispense:  90 tablet    Refill:  3    Follow-up: Return in about 4 months (around 11/16/2024) for HTN, DM and insomnia.    Suzzane MARLA Blanch, MD "

## 2024-07-19 NOTE — Assessment & Plan Note (Signed)
Has been seen by Dr. Craige Cotta Was given Symbicort, but did not help much Well-controlled with [facility] as maintenance inhaler Has albuterol inhaler as needed for dyspnea/wheezing

## 2024-07-20 ENCOUNTER — Ambulatory Visit: Payer: Self-pay | Admitting: Internal Medicine

## 2024-07-20 LAB — CMP14+EGFR
ALT: 26 IU/L (ref 0–44)
AST: 17 IU/L (ref 0–40)
Albumin: 4.8 g/dL (ref 3.9–4.9)
Alkaline Phosphatase: 62 IU/L (ref 47–123)
BUN/Creatinine Ratio: 12 (ref 10–24)
BUN: 11 mg/dL (ref 8–27)
Bilirubin Total: 0.4 mg/dL (ref 0.0–1.2)
CO2: 23 mmol/L (ref 20–29)
Calcium: 9.9 mg/dL (ref 8.6–10.2)
Chloride: 96 mmol/L (ref 96–106)
Creatinine, Ser: 0.9 mg/dL (ref 0.76–1.27)
Globulin, Total: 2.7 g/dL (ref 1.5–4.5)
Glucose: 102 mg/dL — ABNORMAL HIGH (ref 70–99)
Potassium: 4.1 mmol/L (ref 3.5–5.2)
Sodium: 138 mmol/L (ref 134–144)
Total Protein: 7.5 g/dL (ref 6.0–8.5)
eGFR: 94 mL/min/1.73

## 2024-07-20 LAB — LIPID PANEL
Chol/HDL Ratio: 5.9 ratio — ABNORMAL HIGH (ref 0.0–5.0)
Cholesterol, Total: 282 mg/dL — ABNORMAL HIGH (ref 100–199)
HDL: 48 mg/dL
LDL Chol Calc (NIH): 166 mg/dL — ABNORMAL HIGH (ref 0–99)
Triglycerides: 357 mg/dL — ABNORMAL HIGH (ref 0–149)
VLDL Cholesterol Cal: 68 mg/dL — ABNORMAL HIGH (ref 5–40)

## 2024-07-20 LAB — CBC WITH DIFFERENTIAL/PLATELET
Basophils Absolute: 0.1 x10E3/uL (ref 0.0–0.2)
Basos: 1 %
EOS (ABSOLUTE): 0.2 x10E3/uL (ref 0.0–0.4)
Eos: 2 %
Hematocrit: 49.2 % (ref 37.5–51.0)
Hemoglobin: 17 g/dL (ref 13.0–17.7)
Immature Grans (Abs): 0 x10E3/uL (ref 0.0–0.1)
Immature Granulocytes: 0 %
Lymphocytes Absolute: 3 x10E3/uL (ref 0.7–3.1)
Lymphs: 29 %
MCH: 32.9 pg (ref 26.6–33.0)
MCHC: 34.6 g/dL (ref 31.5–35.7)
MCV: 95 fL (ref 79–97)
Monocytes Absolute: 0.7 x10E3/uL (ref 0.1–0.9)
Monocytes: 7 %
Neutrophils Absolute: 6.4 x10E3/uL (ref 1.4–7.0)
Neutrophils: 61 %
Platelets: 216 x10E3/uL (ref 150–450)
RBC: 5.17 x10E6/uL (ref 4.14–5.80)
RDW: 13.2 % (ref 11.6–15.4)
WBC: 10.4 x10E3/uL (ref 3.4–10.8)

## 2024-07-20 LAB — TSH+FREE T4
Free T4: 1.34 ng/dL (ref 0.82–1.77)
TSH: 1.39 u[IU]/mL (ref 0.450–4.500)

## 2024-07-20 LAB — HEMOGLOBIN A1C
Est. average glucose Bld gHb Est-mCnc: 126 mg/dL
Hgb A1c MFr Bld: 6 % — ABNORMAL HIGH (ref 4.8–5.6)

## 2024-07-27 ENCOUNTER — Telehealth: Payer: Self-pay

## 2024-07-27 ENCOUNTER — Other Ambulatory Visit: Payer: Self-pay

## 2024-07-27 DIAGNOSIS — I10 Essential (primary) hypertension: Secondary | ICD-10-CM

## 2024-07-27 MED ORDER — METOPROLOL SUCCINATE ER 50 MG PO TB24: 50.0000 mg | ORAL_TABLET | Freq: Every day | ORAL | 2 refills | Status: AC

## 2024-07-27 NOTE — Telephone Encounter (Signed)
 Copied from CRM [id number]. Topic: Clinical - Prescription Issue >> Jul 27, 2024 12:30 PM Emylou G wrote: Reason for CRM: Patient called said he has zero refills... Can this be updated and sent to [facility]?  metoprolol  succinate (TOPROL -XL) 100 MG 24 hr tablet  he has the 50 just not the 100

## 2024-07-27 NOTE — Telephone Encounter (Signed)
 Refills sent

## 2024-07-28 ENCOUNTER — Other Ambulatory Visit: Payer: Self-pay

## 2024-07-28 ENCOUNTER — Telehealth: Payer: Self-pay

## 2024-07-28 MED ORDER — METOPROLOL SUCCINATE ER 100 MG PO TB24: 100.0000 mg | ORAL_TABLET | Freq: Every day | ORAL | 3 refills | Status: AC

## 2024-07-28 NOTE — Telephone Encounter (Signed)
 Refill sent

## 2024-07-28 NOTE — Telephone Encounter (Signed)
 Copied from CRM [id number]. Topic: Clinical - Prescription Issue >> Jul 28, 2024 12:01 PM Jakyia R wrote: metoprolol  succinate (TOPROL -XL) 100 MG 24 hr tablet [id number] - [place],  - [address] [place] [address] Phone: [phone] Fax: [phone]

## 2024-11-16 ENCOUNTER — Ambulatory Visit: Payer: Self-pay | Admitting: Internal Medicine

## 2025-03-03 ENCOUNTER — Ambulatory Visit: Payer: Self-pay

## 2025-03-07 ENCOUNTER — Ambulatory Visit
